# Patient Record
Sex: Female | Born: 1950
Health system: Southern US, Community
[De-identification: ages and names within clinical notes are randomized; demographics above are authoritative.]

## PROBLEM LIST (undated history)

## (undated) DIAGNOSIS — F419 Anxiety disorder, unspecified: Secondary | ICD-10-CM

## (undated) DIAGNOSIS — I10 Essential (primary) hypertension: Secondary | ICD-10-CM

## (undated) DIAGNOSIS — C3490 Malignant neoplasm of unspecified part of unspecified bronchus or lung: Secondary | ICD-10-CM

---

## 2017-09-29 ENCOUNTER — Emergency Department: Payer: Medicaid Other

## 2017-09-29 ENCOUNTER — Emergency Department
Admission: EM | Admit: 2017-09-29 | Discharge: 2017-09-29 | Disposition: A | Discharge status: Home / Self Care | Payer: Medicaid Other | Attending: Emergency Medicine | Admitting: Emergency Medicine

## 2017-09-29 ENCOUNTER — Other Ambulatory Visit: Payer: Self-pay

## 2017-09-29 DIAGNOSIS — F419 Anxiety disorder, unspecified: Secondary | ICD-10-CM | POA: Insufficient documentation

## 2017-09-29 DIAGNOSIS — I10 Essential (primary) hypertension: Secondary | ICD-10-CM | POA: Insufficient documentation

## 2017-09-29 DIAGNOSIS — Z79899 Other long term (current) drug therapy: Secondary | ICD-10-CM | POA: Diagnosis not present

## 2017-09-29 HISTORY — DX: Essential (primary) hypertension: I10

## 2017-09-29 LAB — BASIC METABOLIC PANEL
Anion gap: 9 (ref 5–15)
BUN: 10 mg/dL (ref 8–23)
CO2: 26 mmol/L (ref 22–32)
Calcium: 9.9 mg/dL (ref 8.9–10.3)
Chloride: 107 mmol/L (ref 98–111)
Creatinine, Ser: 0.76 mg/dL (ref 0.44–1.00)
GFR calc Af Amer: >60 mL/min (ref >60)
GFR calc non Af Amer: >60 mL/min (ref >60)
Glucose, Bld: 163 mg/dL — ABNORMAL HIGH (ref 70–99)
Potassium: 3.3 mmol/L — ABNORMAL LOW (ref 3.5–5.1)
Sodium: 142 mmol/L (ref 135–145)

## 2017-09-29 LAB — TROPONIN I: Troponin I: <0.03 ng/mL (ref <0.03)

## 2017-09-29 LAB — CBC
HCT: 39.4 % (ref 35.0–47.0)
Hemoglobin: 13.3 g/dL (ref 12.0–16.0)
MCH: 31.3 pg (ref 26.0–34.0)
MCHC: 33.8 g/dL (ref 32.0–36.0)
MCV: 92.6 fL (ref 80.0–100.0)
Platelets: 235 10*3/uL (ref 150–440)
RBC: 4.25 MIL/uL (ref 3.80–5.20)
RDW: 13.8 % (ref 11.5–14.5)
WBC: 9.8 10*3/uL (ref 3.6–11.0)

## 2017-09-29 MED ORDER — LORAZEPAM 2 MG/ML IJ SOLN
0.5000 mg | Freq: Once | INTRAMUSCULAR | Status: AC
  Administered 2017-09-29: 0.5 mg via INTRAVENOUS
  Filled 2017-09-29: qty 1

## 2017-09-29 NOTE — ED Provider Notes (Signed)
[facility] Emergency Department Provider Note   First MD Initiated Contact with Patient 09/29/17 [phone]     (approximate)  I have reviewed the triage vital signs and the nursing notes.   HISTORY  Chief Complaint Anxiety   HPI Sara Schroeder is a 67 y.o. female with history of hypertension and anxiety presents to the emergency department via EMS with insomnia, feeling very anxious.  Patient's son states that she has had difficulty sleeping for weeks for which she was prescribed fluoxetine Xanax.  Patient's son states that she refuses to take any medications including her antihypertensives on arrival patient blood pressure is 192/89.  Patient appearing very anxious.   Past Medical History:  Diagnosis Date  . Hypertension     There are no active problems to display for this patient.   Past surgical history None  Prior to Admission medications   Not on File    Allergies No known drug allergies.  History reviewed. No pertinent family history.  Social History Social History   Tobacco Use  . Smoking status: Never Smoker  . Smokeless tobacco: Never Used  Substance Use Topics  . Alcohol use: Never    Frequency: Never  . Drug use: Never    Review of Systems Constitutional: No fever/chills Eyes: No visual changes. ENT: No sore throat. Cardiovascular: Denies chest pain. Respiratory: Denies shortness of breath. Gastrointestinal: No abdominal pain.  No nausea, no vomiting.  No diarrhea.  No constipation. Genitourinary: Negative for dysuria. Musculoskeletal: Negative for neck pain.  Negative for back pain. Integumentary: Negative for rash. Neurological: Negative for headaches, focal weakness or numbness. Psychiatric:Positive for insomnia, positive for anxiety  ____________________________________________   PHYSICAL EXAM:  VITAL SIGNS: ED Triage Vitals  Enc Vitals Group     BP 09/29/17 0435 (!) 192/89     Pulse Rate 09/29/17 0435 (!) 103   Resp 09/29/17 0435 19     Temp 09/29/17 0443 98.7 F (37.1 C)     Temp Source 09/29/17 0443 Oral     SpO2 09/29/17 0435 100 %     Weight 09/29/17 0439 43.1 kg (95 lb)     Height 09/29/17 0439 1.575 m (5\' 2" )     Head Circumference --      Peak Flow --      Pain Score --      Pain Loc --      Pain Edu? --      Excl. in [place]? --     Constitutional: Alert and oriented.  Very anxious affect  eyes: Conjunctivae are normal. Head: Atraumatic. Mouth/Throat: Mucous membranes are moist.  Oropharynx non-erythematous. Neck: No stridor.  No meningeal signs.   Cardiovascular: Normal rate, regular rhythm. Good peripheral circulation. Grossly normal heart sounds. Respiratory: Normal respiratory effort.  No retractions. Lungs CTAB. Gastrointestinal: Soft and nontender. No distention.  Musculoskeletal: No lower extremity tenderness nor edema. No gross deformities of extremities. Neurologic:  Normal speech and language. No gross focal neurologic deficits are appreciated.  Skin:  Skin is warm, dry and intact. No rash noted. Psychiatric: Very anxious affect ____________________________________________   LABS (all labs ordered are listed, but only abnormal results are displayed)  Labs Reviewed  BASIC METABOLIC PANEL - Abnormal; Notable for the following components:      Result Value   Potassium 3.3 (*)    Glucose, Bld 163 (*)    All other components within normal limits  CBC  TROPONIN I   ____________________________________________  EKG  ED ECG  REPORT I, Eagle N Mata Rowen, the attending physician, personally viewed and interpreted this ECG.   Date: 09/29/2017  EKG Time: 4:43 AM  Rate: 96  Rhythm: Normal sinus rhythm  Axis: Normal  Intervals: Normal  ST&T Change: None    Procedures   ____________________________________________   INITIAL IMPRESSION / ASSESSMENT AND PLAN / ED COURSE  As part of my medical decision making, I reviewed the following data within the electronic  MEDICAL RECORD NUMBER  67 year old female presented with above-stated history and physical exam consistent with anxiety attack.  However consider the possibility of medical etiology and as such laboratory data was obtained which is markedly unremarkable.  Chest x-ray was ordered however patient refused chest x-ray to be performed.  Patient was given 0.5 mg of Ativan IV on arrival with complete resolution of symptoms.  Patient requesting to be discharged at this time with no further evaluation to be performed.  Strongly advised patient and her son that she follow-up with primary care provider for further outpatient management of anxiety ____________________________________________  FINAL CLINICAL IMPRESSION(S) / ED DIAGNOSES  Final diagnoses:  Anxiety     MEDICATIONS GIVEN DURING THIS VISIT:  Medications  LORazepam (ATIVAN) injection 0.5 mg (0.5 mg Intravenous Given 09/29/17 0459)     ED Discharge Orders    None       Note:  This document was prepared using Dragon voice recognition software and may include unintentional dictation errors.    Gregor Hams, MD 09/29/17 2259

## 2017-09-29 NOTE — ED Notes (Signed)
Pt speaks [place] Glad. Pt and son both refuse to use the interpreter and prefer to use just her son to translate.

## 2017-09-29 NOTE — ED Notes (Signed)
Pts bed changed by this tech after attempting to use bedpan. New linen placed along with new blankets. Pt resting comfortably. No other needs voiced at this time.

## 2017-09-29 NOTE — ED Triage Notes (Signed)
Pt arrived from home with complaints of anxiety and decreased sleep for a few weeks now. Pt has prescription for xanax and HTN medications but wont' take them. Pt is alert and oriented x 4. BP- 180/80. Pt is non-english speaking. Family at bedside. Dr. Owens Shark at bedside.

## 2018-03-01 ENCOUNTER — Emergency Department
Admission: EM | Admit: 2018-03-01 | Discharge: 2018-03-01 | Disposition: A | Discharge status: Home / Self Care | Payer: Medicaid Other | Attending: Emergency Medicine | Admitting: Emergency Medicine

## 2018-03-01 ENCOUNTER — Emergency Department: Payer: Medicaid Other

## 2018-03-01 ENCOUNTER — Encounter: Payer: Self-pay | Admitting: Emergency Medicine

## 2018-03-01 ENCOUNTER — Other Ambulatory Visit: Payer: Self-pay

## 2018-03-01 DIAGNOSIS — R918 Other nonspecific abnormal finding of lung field: Secondary | ICD-10-CM | POA: Diagnosis not present

## 2018-03-01 DIAGNOSIS — R002 Palpitations: Secondary | ICD-10-CM | POA: Diagnosis not present

## 2018-03-01 DIAGNOSIS — R0602 Shortness of breath: Secondary | ICD-10-CM | POA: Diagnosis present

## 2018-03-01 DIAGNOSIS — I10 Essential (primary) hypertension: Secondary | ICD-10-CM | POA: Diagnosis not present

## 2018-03-01 DIAGNOSIS — N3000 Acute cystitis without hematuria: Secondary | ICD-10-CM

## 2018-03-01 DIAGNOSIS — R1084 Generalized abdominal pain: Secondary | ICD-10-CM | POA: Diagnosis not present

## 2018-03-01 DIAGNOSIS — F419 Anxiety disorder, unspecified: Secondary | ICD-10-CM | POA: Diagnosis not present

## 2018-03-01 HISTORY — DX: Anxiety disorder, unspecified: F41.9

## 2018-03-01 LAB — URINALYSIS, COMPLETE (UACMP) WITH MICROSCOPIC
Bilirubin Urine: NEGATIVE
Glucose, UA: NEGATIVE mg/dL
Hgb urine dipstick: NEGATIVE
Ketones, ur: NEGATIVE mg/dL
Nitrite: NEGATIVE
Protein, ur: NEGATIVE mg/dL
Specific Gravity, Urine: 1.003 — ABNORMAL LOW (ref 1.005–1.030)
pH: 7 (ref 5.0–8.0)

## 2018-03-01 LAB — CBC WITH DIFFERENTIAL/PLATELET
Abs Immature Granulocytes: 0.03 10*3/uL (ref 0.00–0.07)
Basophils Absolute: 0.1 10*3/uL (ref 0.0–0.1)
Basophils Relative: 1 %
Eosinophils Absolute: 0.2 10*3/uL (ref 0.0–0.5)
Eosinophils Relative: 2 %
HCT: 40.4 % (ref 36.0–46.0)
Hemoglobin: 13.2 g/dL (ref 12.0–15.0)
Immature Granulocytes: 0 %
Lymphocytes Relative: 23 %
Lymphs Abs: 2.4 10*3/uL (ref 0.7–4.0)
MCH: 30.1 pg (ref 26.0–34.0)
MCHC: 32.7 g/dL (ref 30.0–36.0)
MCV: 92.2 fL (ref 80.0–100.0)
Monocytes Absolute: 0.6 10*3/uL (ref 0.1–1.0)
Monocytes Relative: 6 %
Neutro Abs: 7.5 10*3/uL (ref 1.7–7.7)
Neutrophils Relative %: 68 %
Platelets: 258 10*3/uL (ref 150–400)
RBC: 4.38 MIL/uL (ref 3.87–5.11)
RDW: 13.1 % (ref 11.5–15.5)
WBC: 10.8 10*3/uL — ABNORMAL HIGH (ref 4.0–10.5)
nRBC: 0 % (ref 0.0–0.2)

## 2018-03-01 LAB — COMPREHENSIVE METABOLIC PANEL
ALT: 20 U/L (ref 0–44)
AST: 21 U/L (ref 15–41)
Albumin: 4.7 g/dL (ref 3.5–5.0)
Alkaline Phosphatase: 89 U/L (ref 38–126)
Anion gap: 9 (ref 5–15)
BUN: 15 mg/dL (ref 8–23)
CO2: 25 mmol/L (ref 22–32)
Calcium: 9.1 mg/dL (ref 8.9–10.3)
Chloride: 104 mmol/L (ref 98–111)
Creatinine, Ser: 0.67 mg/dL (ref 0.44–1.00)
GFR calc Af Amer: >60 mL/min (ref >60)
GFR calc non Af Amer: >60 mL/min (ref >60)
Glucose, Bld: 133 mg/dL — ABNORMAL HIGH (ref 70–99)
Potassium: 3.5 mmol/L (ref 3.5–5.1)
Sodium: 138 mmol/L (ref 135–145)
Total Bilirubin: 0.9 mg/dL (ref 0.3–1.2)
Total Protein: 8.2 g/dL — ABNORMAL HIGH (ref 6.5–8.1)

## 2018-03-01 LAB — TSH: TSH: 1.935 u[IU]/mL (ref 0.350–4.500)

## 2018-03-01 LAB — BRAIN NATRIURETIC PEPTIDE: B Natriuretic Peptide: 16 pg/mL (ref 0.0–100.0)

## 2018-03-01 LAB — LIPASE, BLOOD: Lipase: 27 U/L (ref 11–51)

## 2018-03-01 LAB — TROPONIN I: Troponin I: <0.03 ng/mL (ref <0.03)

## 2018-03-01 IMAGING — CT CT ABD-PELV W/ CM
3 of 12 series · 13 of 46 positions shown, 18 images · IV contrast (APPLIED)
Comparison: None.

CLINICAL DATA: Shortness of breath, heart palpitations. Lower
abdominal pain, loose stools.

EXAM:
CT ANGIOGRAPHY CHEST
CT ABDOMEN AND PELVIS WITH CONTRAST
TECHNIQUE: Multidetector CT imaging of the chest was performed using the
standard protocol during bolus administration of intravenous
contrast. Multiplanar CT image reconstructions and MIPs were
obtained to evaluate the vascular anatomy. Multidetector CT imaging
of the abdomen and pelvis was performed using the standard protocol
during bolus administration of intravenous contrast.
CONTRAST:  60mL OMNIPAQUE IOHEXOL 350 MG/ML SOLN

[Series 5: thins · axial · 0.59mm/px · z∈[-180,+49]mm · 8 of 269 slices shown]
[im 20/269  soft-tissue]
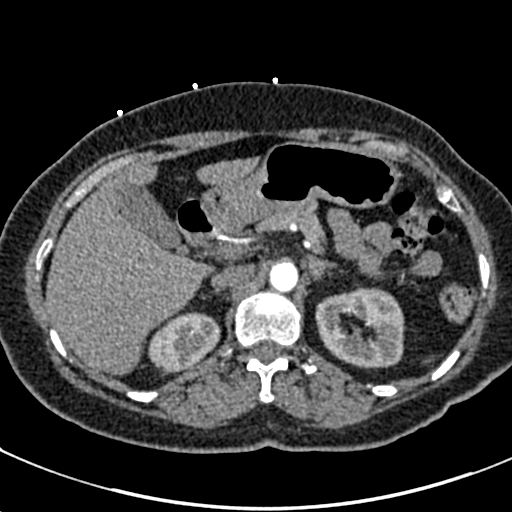
[im 58/269  soft-tissue]
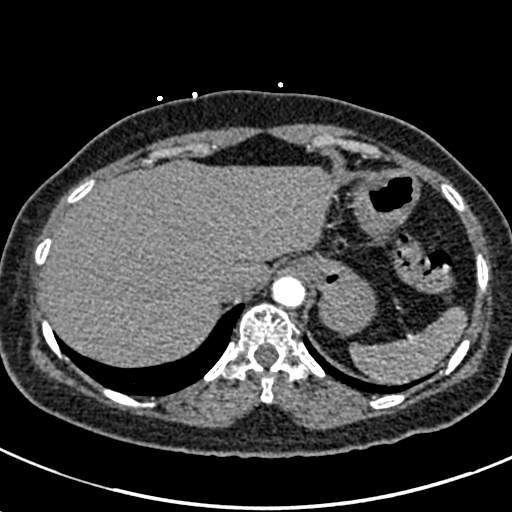
[im 96/269  soft-tissue]
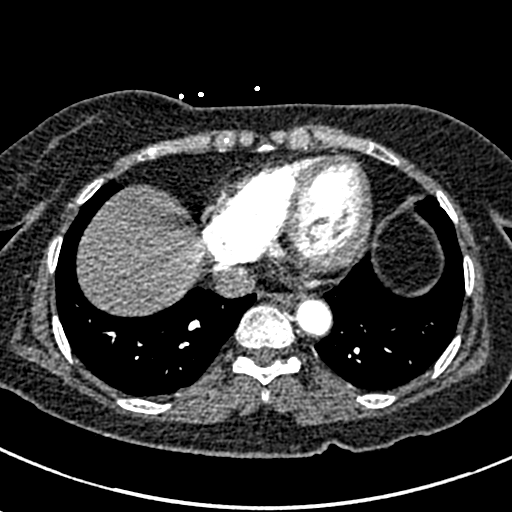
[im 115/269  soft-tissue]
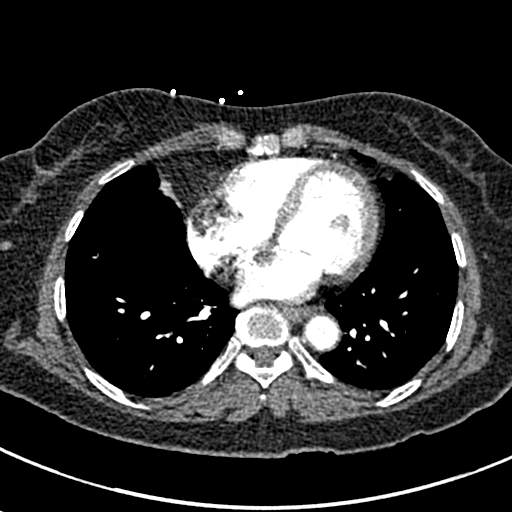
[im 154/269  soft-tissue]
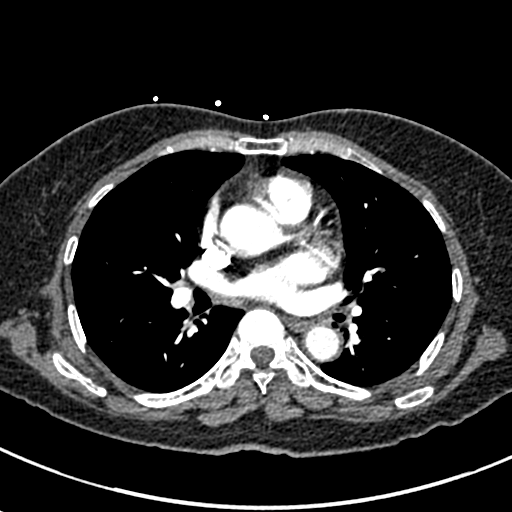
[im 173/269  soft-tissue]
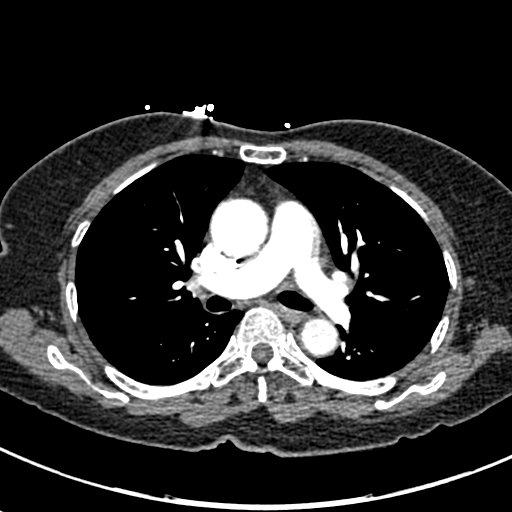
[im 211/269  soft-tissue]
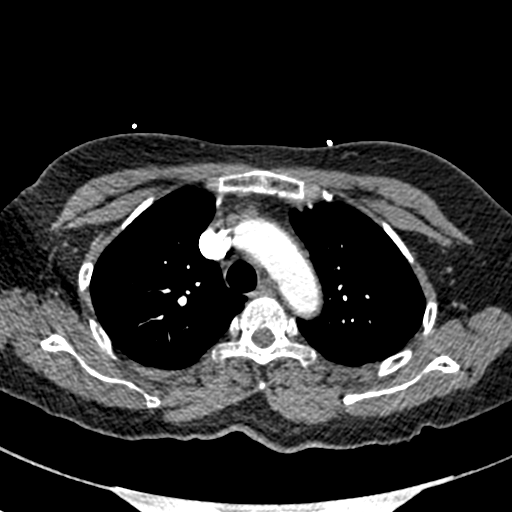
[im 249/269  soft-tissue]
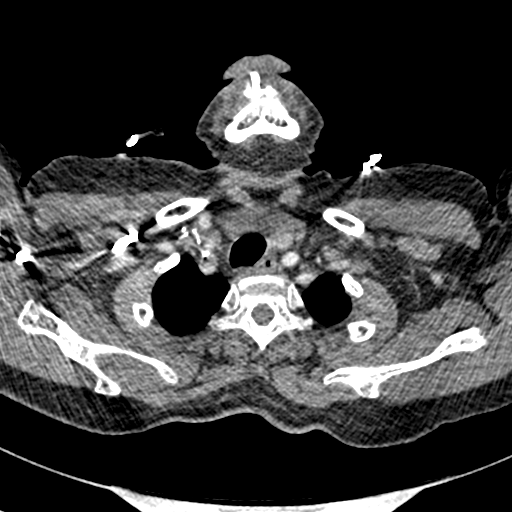

[Series 7: coronal mpr · coronal · 0.55mm/px · 2 of 99 slices shown, 3 images]
[im 33/99  soft-tissue]
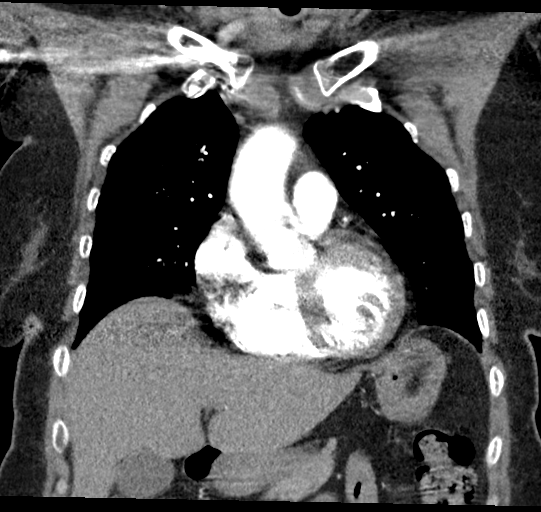
[im 33/99  bone]
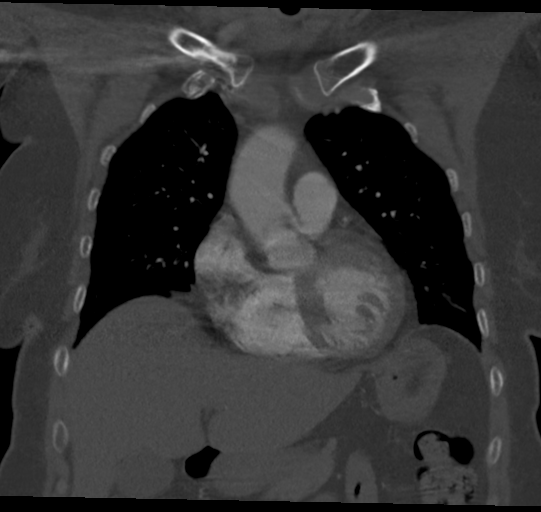
[im 66/99  soft-tissue]
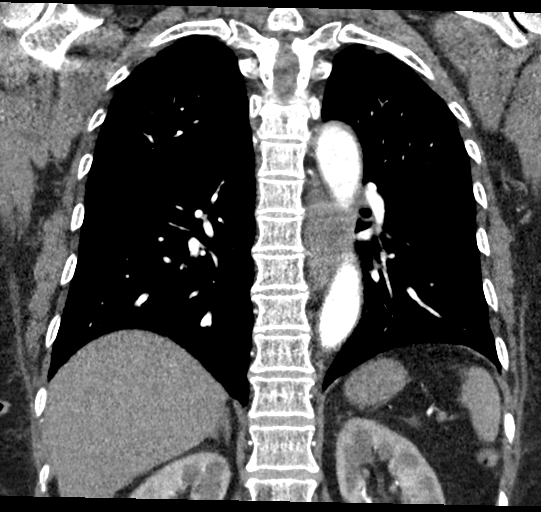

[Series 11: axial st · axial · 0.69mm/px · z∈[-406,-206]mm · 3 of 82 slices shown, 7 images]
[im 21/82  soft-tissue]
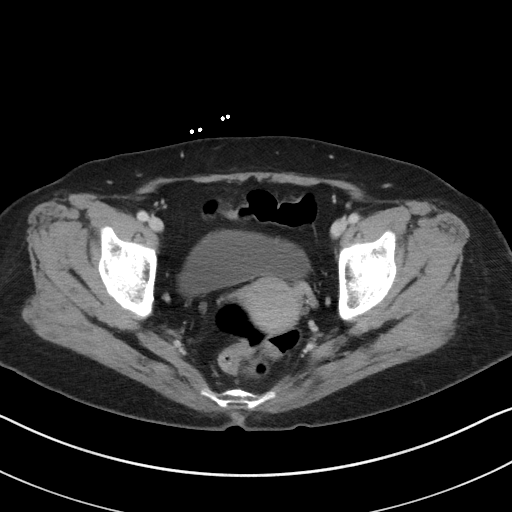
[im 21/82  lung]
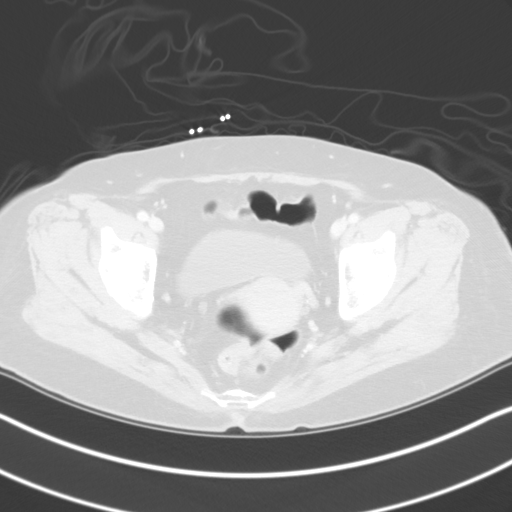
[im 21/82  bone]
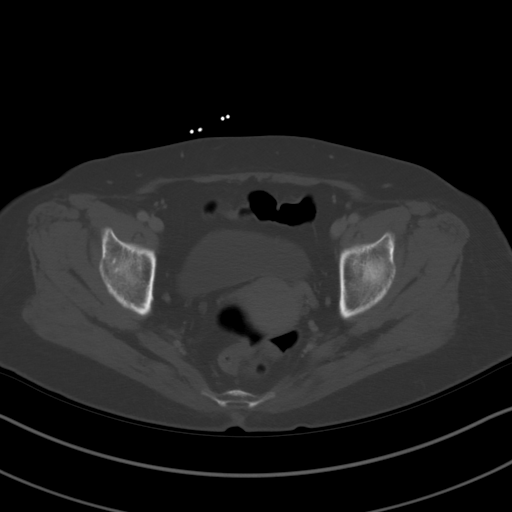
[im 41/82  soft-tissue]
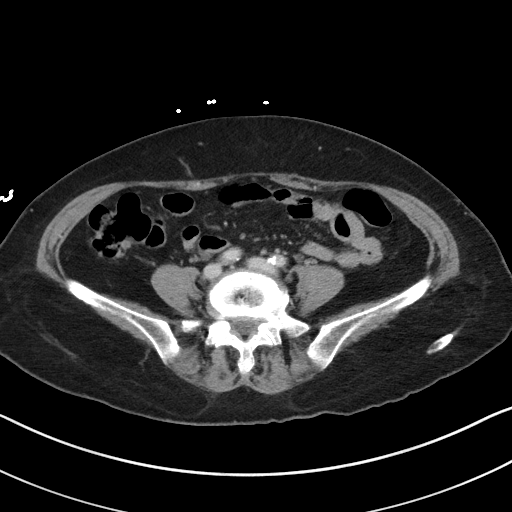
[im 41/82  lung]
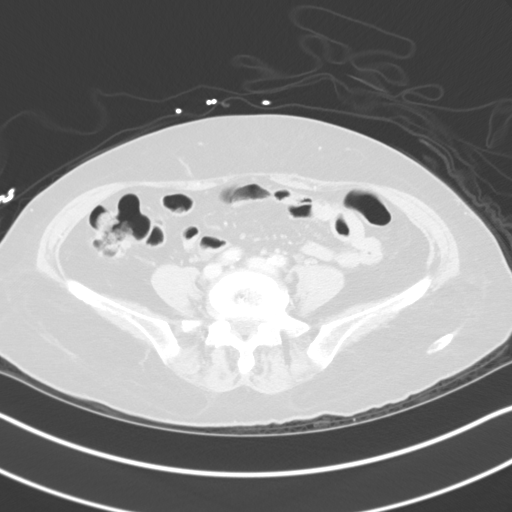
[im 61/82  soft-tissue]
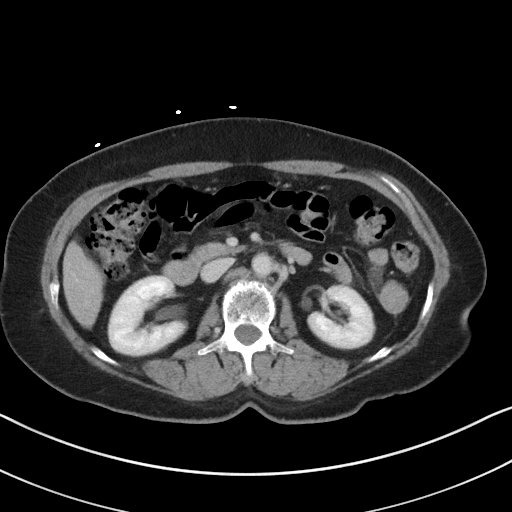
[im 61/82  lung]
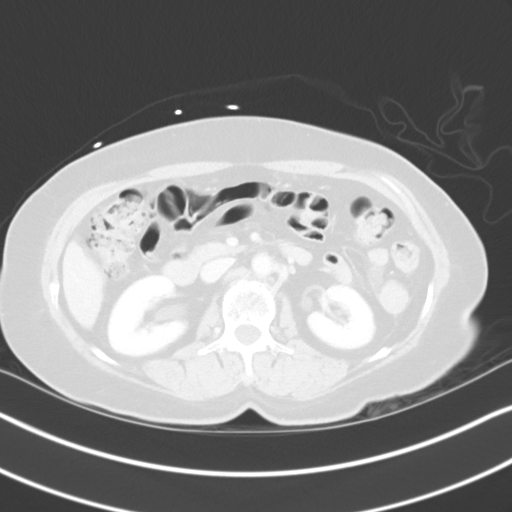

[13 of 46 positions shown; findings below may reference images not displayed]

FINDINGS: CTA CHEST FINDINGS

Cardiovascular: Heart is normal size. Aorta is normal caliber. No
filling defects in the pulmonary arteries to suggest pulmonary
emboli.

Mediastinum/Nodes: No mediastinal, hilar, or axillary adenopathy.

Lungs/Pleura: 10 mm nodule in the right middle lobe has a slightly
spiculated appearance, best seen on sagittal image 39. This is
noncalcified. No additional pulmonary nodules or confluent airspace
opacities. No effusions.

Musculoskeletal: Chest wall soft tissues are unremarkable. No acute
bony abnormality.

Review of the MIP images confirms the above findings.

CT ABDOMEN and PELVIS FINDINGS

Hepatobiliary: No focal hepatic abnormality. Gallbladder
unremarkable.

Pancreas: No focal abnormality or ductal dilatation.

Spleen: No focal abnormality.  Normal size.

Adrenals/Urinary Tract: No adrenal abnormality. No focal renal
abnormality. No stones or hydronephrosis. Urinary bladder is
unremarkable.

Stomach/Bowel: Stomach, large and small bowel grossly unremarkable.
Appendix is normal.

Vascular/Lymphatic: Aortic atherosclerosis. No enlarged abdominal or
pelvic lymph nodes.

Reproductive: Uterus and adnexa unremarkable.  No mass.

Other: No free fluid or free air.

Musculoskeletal: Degenerative changes in the lumbar spine. No acute
bony abnormality.

Review of the MIP images confirms the above findings.
IMPRESSION: 10 mm slightly spiculated nodule in the right middle lobe concerning
for primary lung cancer. Consider one of the following in 3 months
for both low-risk and high-risk individuals: (a) repeat chest CT,
(b) follow-up PET-CT, or (c) tissue sampling. This recommendation
follows the consensus statement: Guidelines for Management of
Incidental Pulmonary Nodules Detected on CT Images: From the

No evidence of pulmonary embolus.

No acute findings in the abdomen or pelvis.

## 2018-03-01 IMAGING — CR DG CHEST 2V
2 series · 2 of 2 positions shown · non-contrast
Comparison: None.

CLINICAL DATA: Shortness of breath.

EXAM:
CHEST - 2 VIEW

[chest pa]
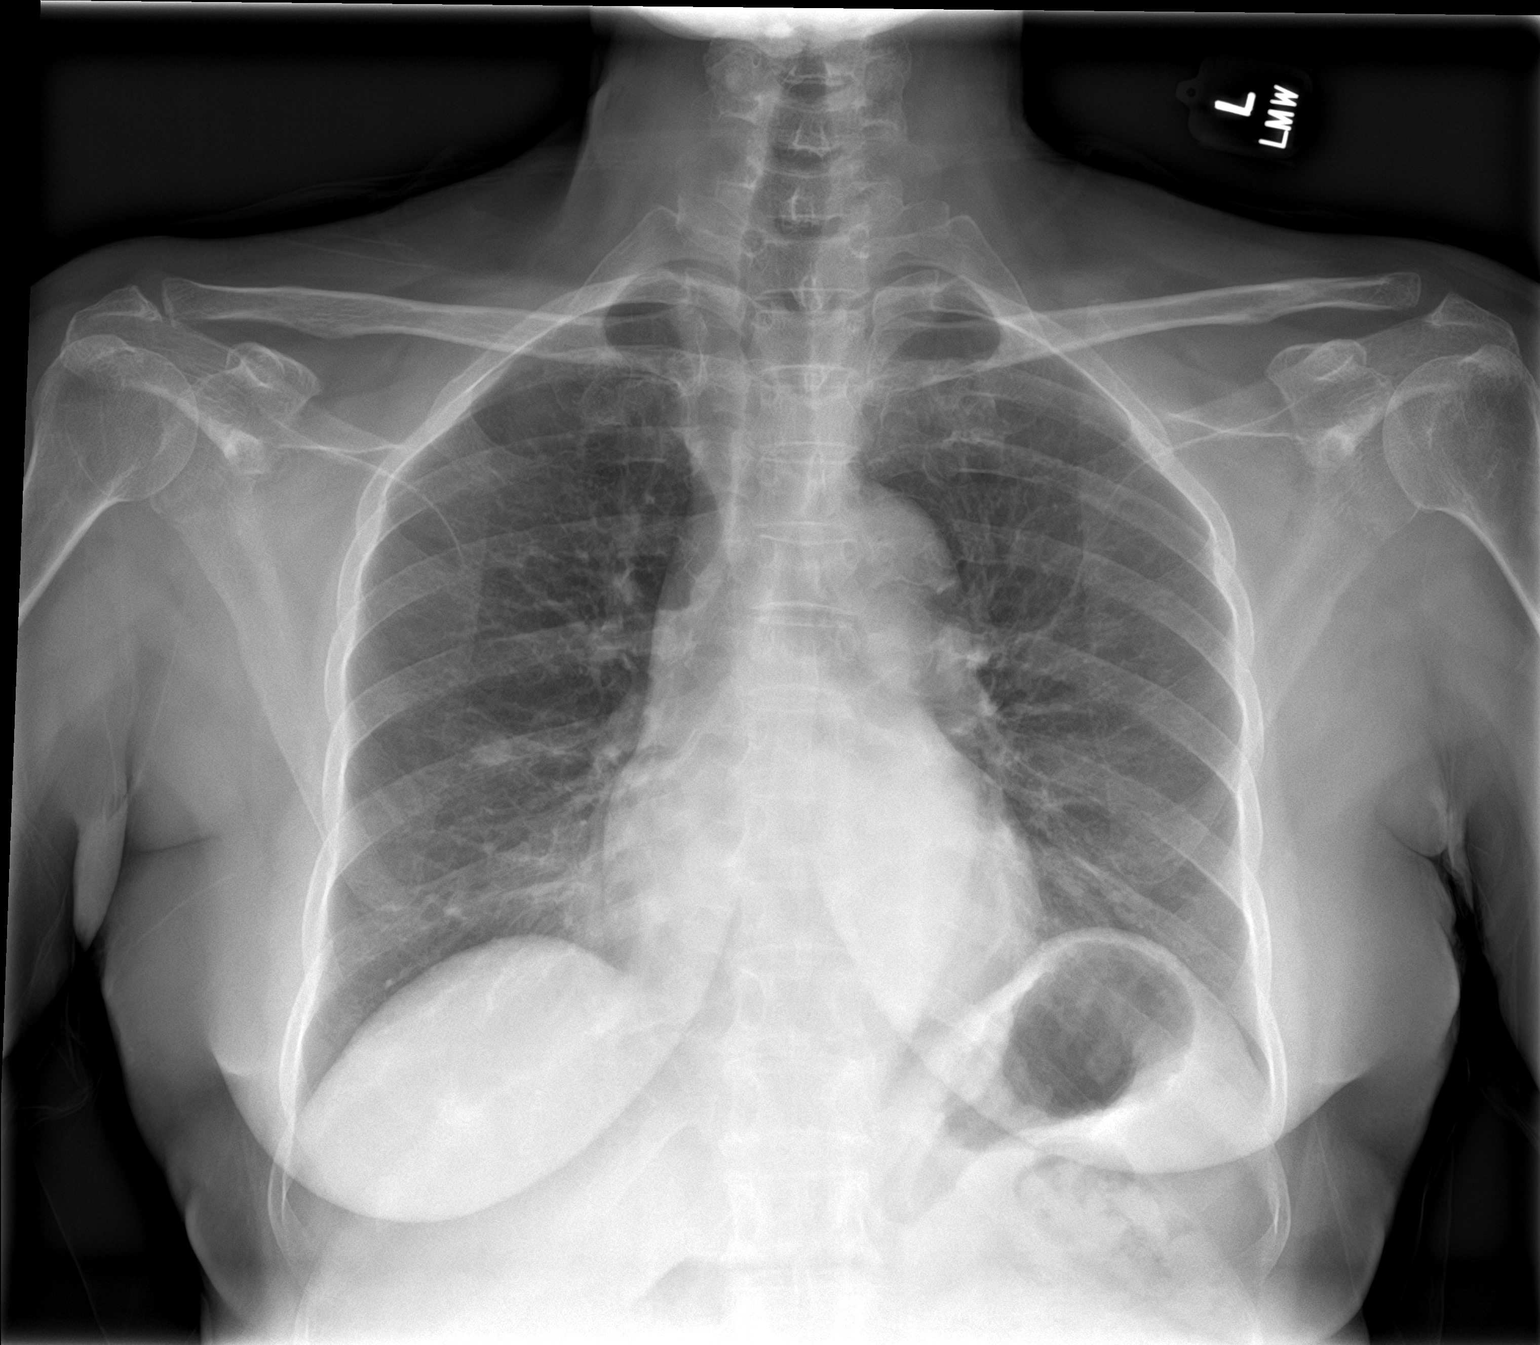

[chest lat]
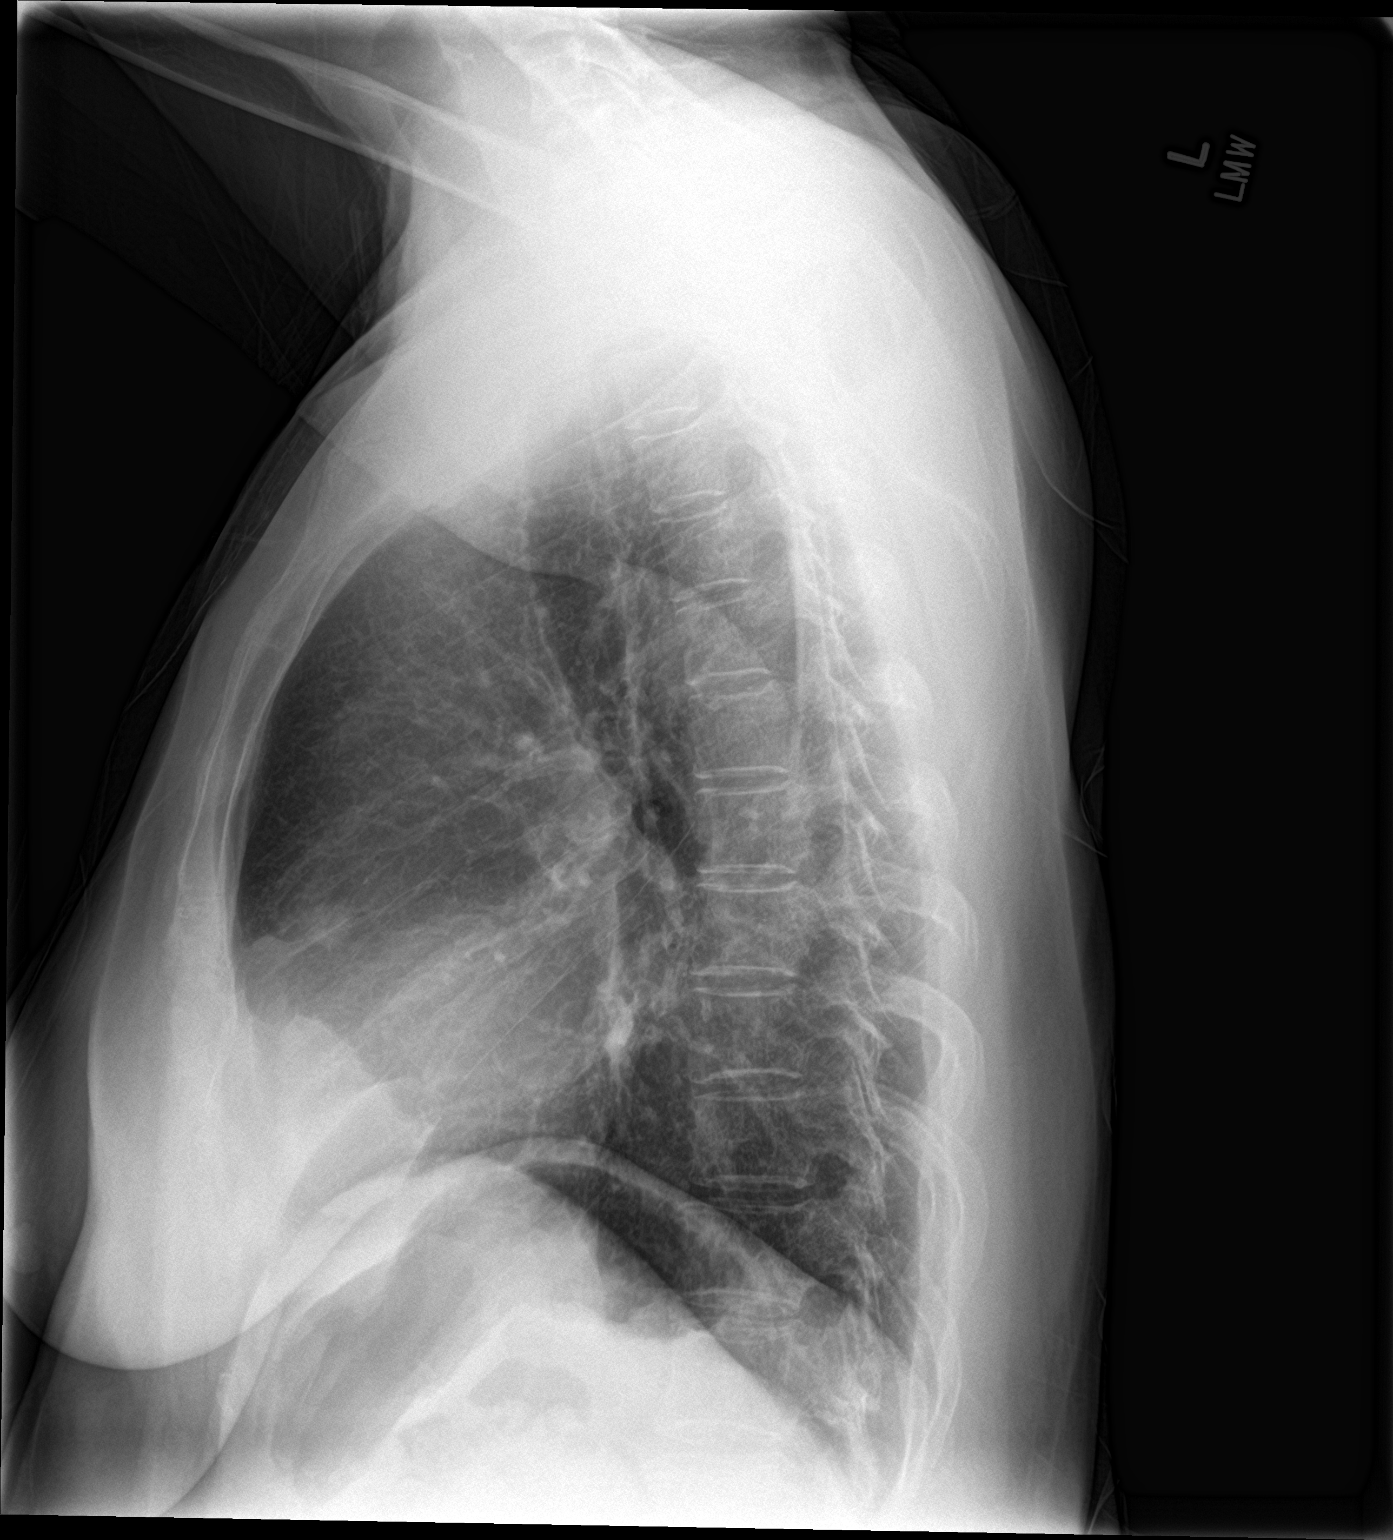

[2 of 2 positions shown; findings below may reference images not displayed]

FINDINGS: The cardiomediastinal contours are normal. Well-defined 10 mm nodule
in the right mid lung may be a calcified granuloma but is
indeterminate. Pulmonary vasculature is normal. No consolidation,
pleural effusion, or pneumothorax. No acute osseous abnormalities
are seen.
IMPRESSION: 1. No acute findings.
2. A 10 mm nodule in the right mid lung may be a calcified granuloma
but is nonspecific. Recommend nonemergent chest CT for
characterization.

## 2018-03-01 MED ORDER — SODIUM CHLORIDE 0.9 % IV SOLN
1.0000 g | Freq: Once | INTRAVENOUS | Status: AC
  Administered 2018-03-01: 1 g via INTRAVENOUS
  Filled 2018-03-01: qty 10

## 2018-03-01 MED ORDER — ALUM & MAG HYDROXIDE-SIMETH 200-200-20 MG/5ML PO SUSP
30.0000 mL | Freq: Once | ORAL | Status: AC
Start: 2018-03-01 — End: 2018-03-01
  Administered 2018-03-01: 30 mL via ORAL
  Filled 2018-03-01: qty 30

## 2018-03-01 MED ORDER — LORAZEPAM 1 MG PO TABS: 1.0000 mg | ORAL_TABLET | Freq: Three times a day (TID) | ORAL | 0 refills | Status: AC | PRN

## 2018-03-01 MED ORDER — CEPHALEXIN 500 MG PO CAPS: 500.0000 mg | ORAL_CAPSULE | Freq: Four times a day (QID) | ORAL | 0 refills | Status: AC

## 2018-03-01 MED ORDER — LORAZEPAM 2 MG/ML IJ SOLN
1.0000 mg | Freq: Once | INTRAMUSCULAR | Status: AC
  Administered 2018-03-01: 1 mg via INTRAVENOUS
  Filled 2018-03-01: qty 1

## 2018-03-01 MED ORDER — IOHEXOL 350 MG/ML SOLN
60.0000 mL | Freq: Once | INTRAVENOUS | Status: AC | PRN
  Administered 2018-03-01: 60 mL via INTRAVENOUS

## 2018-03-01 NOTE — ED Triage Notes (Addendum)
Patient ambulatory to triage with steady gait, without difficulty or distress noted; son st pt reports SHOB, diff sleeping, lower abd pain; denies any recent illness

## 2018-03-01 NOTE — ED Notes (Signed)
Pt ambulated with steady gait noted, 100% on RA. MD aware

## 2018-03-01 NOTE — ED Provider Notes (Signed)
[facility] Emergency Department Provider Note  ____________________________________________  Time seen: Approximately 8:50 AM  I have reviewed the triage vital signs and the nursing notes.   HISTORY  Chief Complaint Shortness of Breath    HPI Sara Schroeder is a 67 y.o. female history of anxiety, HTN, presenting for shortness of breath, abdominal pain, urinary symptoms and diarrhea.  The patient speaks Sara Schroeder and her son is interpreting for [place].  She reports that for the past 2 months, every time she urinates, she has a burning sensation.  She also has multiple loose stools every night.  After these episodes, she becomes anxious, and feels short of breath.  Over a period of 30 to 60 minutes, her symptoms resolve. She also has a central diffuse abdominal "burning".  Occasionally she has palpitations. She has seen her PMD, who treated her for UTI 1 week ago, with repeat UA showing resolution of symptoms.  She has also been placed on omeprazole for her central abdominal "burning," but her symptoms persist.  She is not having any vomiting, fevers or chills.  She has had similar symptoms in the past.  She is being treated with fluoxetine, which she has been taking for several months, for anxiety, as well as once weekly art therapy which comes to her home.  She is not seeing a psychiatrist and has not had any psychiatric evaluation.  She denies any SI, HI or hallucinations.  Past Medical History:  Diagnosis Date  . Anxiety   . Hypertension     There are no active problems to display for this patient.   History reviewed. No pertinent surgical history.    Allergies Patient has no known allergies.  No family history on file.  Social History Social History   Tobacco Use  . Smoking status: Never Smoker  . Smokeless tobacco: Never Used  Substance Use Topics  . Alcohol use: Never    Frequency: Never  . Drug use: Never    Review of Systems Constitutional: No  fever/chills.  Lightheadedness or syncope.  No myalgias. Eyes: No visual changes.  No eye discharge. ENT: No sore throat. No congestion or rhinorrhea. Cardiovascular: Denies chest pain.  Positive palpitations. Respiratory: Positive shortness of breath.  No cough. Gastrointestinal: As of diffuse burning abdominal pain.  No nausea, no vomiting.  Positive diarrhea.  No constipation. Genitourinary: Positive for dysuria.  As of urinary frequency.  No hematuria. Musculoskeletal: Negative for back pain. Skin: Negative for rash. Neurological: Negative for headaches. No focal numbness, tingling or weakness.  Pscyh: + anxiety without SI, HI or hallucinations.    ____________________________________________   PHYSICAL EXAM:  VITAL SIGNS: ED Triage Vitals  Enc Vitals Group     BP 03/01/18 0257 (!) 178/82     Pulse Rate 03/01/18 0257 93     Resp 03/01/18 0257 (!) 24     Temp 03/01/18 0257 98.7 F (37.1 C)     Temp Source 03/01/18 0257 Oral     SpO2 03/01/18 0257 96 %     Weight --      Height 03/01/18 0300 5\' 1"  (1.549 m)     Head Circumference --      Peak Flow --      Pain Score 03/01/18 0258 0     Pain Loc --      Pain Edu? --      Excl. in [place]? --     Constitutional: Patient is alert, tachypneic, and very anxious in appearance.  She  is nontoxic. Eyes: Conjunctivae are normal.  EOMI. No scleral icterus.  No eye discharge. Head: Atraumatic. Nose: No congestion/rhinnorhea. Mouth/Throat: Mucous membranes are widely dry..  Neck: No stridor.  Supple.  No JVD.  No meningismus. Cardiovascular: Normal rate, regular rhythm. No murmurs, rubs or gallops.  Respiratory: The patient is tachypneic without any other abnormalities on her pulmonary examination.  She has no wheezes rales or rhonchi; her O2 sats are 99-100% at rest and remain 100% with ambulation.  She is able to speak in full sentences without any change in her pulmonary status. Gastrointestinal: Soft, and nondistended.  Diffusely  tender without focality.  Negative Murphy sign.  No guarding or rebound.  No peritoneal signs. Musculoskeletal: No LE edema. No ttp in the calves or palpable cords.  Negative Homan's sign. Neurologic:  A&Ox3.  Speech is clear.  Face and smile are symmetric.  EOMI.  Moves all extremities well. Skin:  Skin is warm, dry and intact. No rash noted. Psychiatric: Significant anxious affect.  ____________________________________________   LABS (all labs ordered are listed, but only abnormal results are displayed)  Labs Reviewed  CBC WITH DIFFERENTIAL/PLATELET - Abnormal; Notable for the following components:      Result Value   WBC 10.8 (*)    All other components within normal limits  COMPREHENSIVE METABOLIC PANEL - Abnormal; Notable for the following components:   Glucose, Bld 133 (*)    Total Protein 8.2 (*)    All other components within normal limits  URINALYSIS, COMPLETE (UACMP) WITH MICROSCOPIC - Abnormal; Notable for the following components:   Color, Urine COLORLESS (*)    APPearance CLEAR (*)    Specific Gravity, Urine 1.003 (*)    Leukocytes, UA TRACE (*)    Bacteria, UA RARE (*)    All other components within normal limits  URINE CULTURE  C DIFFICILE QUICK SCREEN W PCR REFLEX  GASTROINTESTINAL PANEL BY PCR, STOOL (REPLACES STOOL CULTURE)  LIPASE, BLOOD  TROPONIN I  BRAIN NATRIURETIC PEPTIDE  TSH   ____________________________________________  EKG  ED ECG REPORT I, Anne-Caroline Mariea Clonts, the attending physician, personally viewed and interpreted this ECG.   Date: 03/01/2018  EKG Time: 301  Rate: 97  Rhythm: normal sinus rhythm  Axis: normal  Intervals:none  ST&T Change: No STEMI  ____________________________________________  RADIOLOGY  Dg Chest 2 View  Result Date: 03/01/2018 CLINICAL DATA:  Shortness of breath. EXAM: CHEST - 2 VIEW COMPARISON:  None. FINDINGS: The cardiomediastinal contours are normal. Well-defined 10 mm nodule in the right mid lung may be  a calcified granuloma but is indeterminate. Pulmonary vasculature is normal. No consolidation, pleural effusion, or pneumothorax. No acute osseous abnormalities are seen. IMPRESSION: 1. No acute findings. 2. A 10 mm nodule in the right mid lung may be a calcified granuloma but is nonspecific. Recommend nonemergent chest CT for characterization. Electronically Signed   By: Keith Rake M.D.   On: 03/01/2018 03:34    ____________________________________________   PROCEDURES  Procedure(s) performed: None  Procedures  Critical Care performed: No ____________________________________________   INITIAL IMPRESSION / ASSESSMENT AND PLAN / ED COURSE  Pertinent labs & imaging results that were available during my care of the patient were reviewed by me and considered in my medical decision making (see chart for details).  68 y.o. female with a history of hypertension and anxiety presenting with 2 months of daily tachypnea, palpitations, that occur after dysuria or daily loose stools.  Overall, the patient is hemodynamically stable and has reassuring objective findings  on her exam and laboratory studies.  Her white blood cell count is 10.8 and her hemoglobin and hematocrit are normal.  Her electrolytes are also reassuring.  Her lipase is normal.  Her liver function tests are normal.  She does have some bacteria in her urine as well as leukocyte esterase, and a gram of Rocephin has been ordered.  I sent her urine for culture.  There are multiple possible medical and psychiatric etiologies for the patient's symptoms.  Clinically, I am most concerned about significant, uncontrolled anxiety.  I have had a long discussion with the patient and her family, and offered ED psychiatric evaluation.  They prefer an outpatient treatment course so I will talk to TTS about giving the patient our outpatient psychiatric resources.  However, I will rule out other possible medical conditions for her symptoms.  Stool  studies including C. difficile have been ordered.  The patient will also have a troponin done, and a BNP; her EKG does not show arrhythmia or ischemic changes.  She will also have a TSH tested.  CT of the chest rule out PE and a CT of the abdomen to rule out an acute intra-abdominal pathology have also been ordered.  Patient has never seen a GI specialist.  A thorough emergent condition work-up will be performed prior to discharge for outpatient psychiatric evaluation.  The patient will begin on Ativan and reevaluated for her symptoms.  ----------------------------------------- 9:49 AM on 03/01/2018 -----------------------------------------  The patient's work-up in the emergency department has been reassuring.  Her CT of the abdomen does not show any acute process.  The CT of her chest shows a mass in the right midlung, which will need further outpatient evaluation and I have explained this to the patient and her family, as well as written thorough notes about this in her discharge paperwork.  She understands that it could potentially be a malignancy or neoplasm.  The patient symptoms significantly improved after oral Ativan.  I will plan to discharge her home with 12 tablets of Ativan and TTS will bring her a list of the local psychiatrist who will be able to see her for outpatient psychiatric evaluation.  I have also recommended that she continue her fluoxetine as prescribed.  At this time, the patient is safe for discharge home as there are no emergent conditions that have been found.  Follow-up instructions as well as return precautions were discussed.  ____________________________________________  FINAL CLINICAL IMPRESSION(S) / ED DIAGNOSES  Final diagnoses:  None         NEW MEDICATIONS STARTED DURING THIS VISIT:  New Prescriptions   No medications on file      Eula Listen, MD 03/01/18 [phone]

## 2018-03-01 NOTE — ED Notes (Signed)
Patient ambulatory to Rm 18, [place] RN aware of room placement.

## 2018-03-01 NOTE — Discharge Instructions (Addendum)
Please take the entire course of antibiotics for your urinary tract infection, even if you are feeling better.  Please have your primary care physician follow-up the results of your urine culture to make sure this antibiotic is the best choice for the type of infection that you have.  Here, you were unable to give a sample of your stool.  Please take the specimen cup with you and if you are able to capture some diarrhea, bring it with you to your primary care physician who can evaluate it for infection.  Please continue to take the omeprazole and make an appointment to see the gastrointestinal doctor for further evaluation of your abdominal burning and diarrhea.  Please follow the instructions for bland diet to decrease your symptoms.  Today, the CT scan of your chest shows an abnormal mass in the right lung.  It is unclear what this mass is.  Your primary care physician will need to follow-up the results, either with additional imaging or referral to have this mass evaluated with biopsy.  It is very important that you follow-up with this result, as it may be the sign of a lung cancer.  For your anxiety, please make an appointment with a psychiatrist.  Continue the fluoxetine, and take Ativan as needed if you are feeling anxious.  Return to the emergency department for severe pain, shortness of breath, chest pain, lightheadedness or fainting, new symptoms, vomiting, or any other symptoms concerning to you.

## 2018-03-02 LAB — URINE CULTURE: Culture: NO GROWTH

## 2018-03-04 ENCOUNTER — Telehealth: Payer: Self-pay | Admitting: Gastroenterology

## 2018-03-04 NOTE — Telephone Encounter (Signed)
I have called & l/m for a return call to set up a 1 wk ED f/u per Dr Marius Ditch in my in basket.

## 2018-03-11 NOTE — Telephone Encounter (Signed)
I tried calling today & the phone just rang and the went dead. I will try again later.

## 2018-03-18 ENCOUNTER — Telehealth: Payer: Self-pay | Admitting: Gastroenterology

## 2018-03-18 NOTE — Telephone Encounter (Signed)
I again have called Day Day patient's son  And L/M to schedule an ED f/u appointment with Dr Marius Ditch per her request.

## 2018-03-25 ENCOUNTER — Encounter: Payer: Self-pay | Admitting: Gastroenterology

## 2019-06-26 ENCOUNTER — Emergency Department
Admission: EM | Admit: 2019-06-26 | Discharge: 2019-06-27 | Disposition: A | Discharge status: Home / Self Care | Payer: Medicare Other | Attending: Emergency Medicine | Admitting: Emergency Medicine

## 2019-06-26 ENCOUNTER — Emergency Department: Payer: Medicare Other

## 2019-06-26 DIAGNOSIS — R900 Intracranial space-occupying lesion found on diagnostic imaging of central nervous system: Secondary | ICD-10-CM | POA: Diagnosis not present

## 2019-06-26 DIAGNOSIS — R4182 Altered mental status, unspecified: Secondary | ICD-10-CM | POA: Diagnosis not present

## 2019-06-26 DIAGNOSIS — R0989 Other specified symptoms and signs involving the circulatory and respiratory systems: Secondary | ICD-10-CM | POA: Diagnosis not present

## 2019-06-26 DIAGNOSIS — D699 Hemorrhagic condition, unspecified: Secondary | ICD-10-CM | POA: Diagnosis not present

## 2019-06-26 DIAGNOSIS — R569 Unspecified convulsions: Secondary | ICD-10-CM | POA: Insufficient documentation

## 2019-06-26 DIAGNOSIS — Z20822 Contact with and (suspected) exposure to covid-19: Secondary | ICD-10-CM | POA: Insufficient documentation

## 2019-06-26 DIAGNOSIS — I10 Essential (primary) hypertension: Secondary | ICD-10-CM | POA: Diagnosis not present

## 2019-06-26 LAB — CBC WITH DIFFERENTIAL/PLATELET
Abs Immature Granulocytes: 0.06 10*3/uL (ref 0.00–0.07)
Basophils Absolute: 0.1 10*3/uL (ref 0.0–0.1)
Basophils Relative: 0 %
Eosinophils Absolute: 0.2 10*3/uL (ref 0.0–0.5)
Eosinophils Relative: 1 %
HCT: 38.7 % (ref 36.0–46.0)
Hemoglobin: 13.1 g/dL (ref 12.0–15.0)
Immature Granulocytes: 0 %
Lymphocytes Relative: 10 %
Lymphs Abs: 1.9 10*3/uL (ref 0.7–4.0)
MCH: 31 pg (ref 26.0–34.0)
MCHC: 33.9 g/dL (ref 30.0–36.0)
MCV: 91.7 fL (ref 80.0–100.0)
Monocytes Absolute: 0.6 10*3/uL (ref 0.1–1.0)
Monocytes Relative: 3 %
Neutro Abs: 15.2 10*3/uL — ABNORMAL HIGH (ref 1.7–7.7)
Neutrophils Relative %: 86 %
Platelets: 230 10*3/uL (ref 150–400)
RBC: 4.22 MIL/uL (ref 3.87–5.11)
RDW: 13.2 % (ref 11.5–15.5)
WBC: 17.9 10*3/uL — ABNORMAL HIGH (ref 4.0–10.5)
nRBC: 0 % (ref 0.0–0.2)

## 2019-06-26 LAB — COMPREHENSIVE METABOLIC PANEL
ALT: 17 U/L (ref 0–44)
AST: 25 U/L (ref 15–41)
Albumin: 4.8 g/dL (ref 3.5–5.0)
Alkaline Phosphatase: 82 U/L (ref 38–126)
Anion gap: 9 (ref 5–15)
BUN: 19 mg/dL (ref 8–23)
CO2: 26 mmol/L (ref 22–32)
Calcium: 9.3 mg/dL (ref 8.9–10.3)
Chloride: 103 mmol/L (ref 98–111)
Creatinine, Ser: 0.67 mg/dL (ref 0.44–1.00)
GFR calc Af Amer: >60 mL/min (ref >60)
GFR calc non Af Amer: >60 mL/min (ref >60)
Glucose, Bld: 114 mg/dL — ABNORMAL HIGH (ref 70–99)
Potassium: 3.5 mmol/L (ref 3.5–5.1)
Sodium: 138 mmol/L (ref 135–145)
Total Bilirubin: 1.2 mg/dL (ref 0.3–1.2)
Total Protein: 7.9 g/dL (ref 6.5–8.1)

## 2019-06-26 LAB — URINALYSIS, COMPLETE (UACMP) WITH MICROSCOPIC
Bacteria, UA: NONE SEEN
Bilirubin Urine: NEGATIVE
Glucose, UA: NEGATIVE mg/dL
Hgb urine dipstick: NEGATIVE
Ketones, ur: NEGATIVE mg/dL
Leukocytes,Ua: NEGATIVE
Nitrite: NEGATIVE
Protein, ur: 100 mg/dL — AB
Specific Gravity, Urine: 1.016 (ref 1.005–1.030)
pH: 8 (ref 5.0–8.0)

## 2019-06-26 LAB — RESPIRATORY PANEL BY RT PCR (FLU A&B, COVID)
Influenza A by PCR: NEGATIVE
Influenza B by PCR: NEGATIVE
SARS Coronavirus 2 by RT PCR: NEGATIVE

## 2019-06-26 LAB — PROTIME-INR
INR: 0.9 (ref 0.8–1.2)
Prothrombin Time: 12 seconds (ref 11.4–15.2)

## 2019-06-26 LAB — TROPONIN I (HIGH SENSITIVITY)
Troponin I (High Sensitivity): 5 ng/L (ref <18)
Troponin I (High Sensitivity): 14 ng/L (ref <18)

## 2019-06-26 IMAGING — DX DG CHEST 1V PORT
1 series · 1 of 1 positions shown · non-contrast
Comparison: Earlier radiograph dated [DATE].

CLINICAL DATA: 68-year-old female status post intubation.

EXAM:
PORTABLE CHEST 1 VIEW

[chest ap]
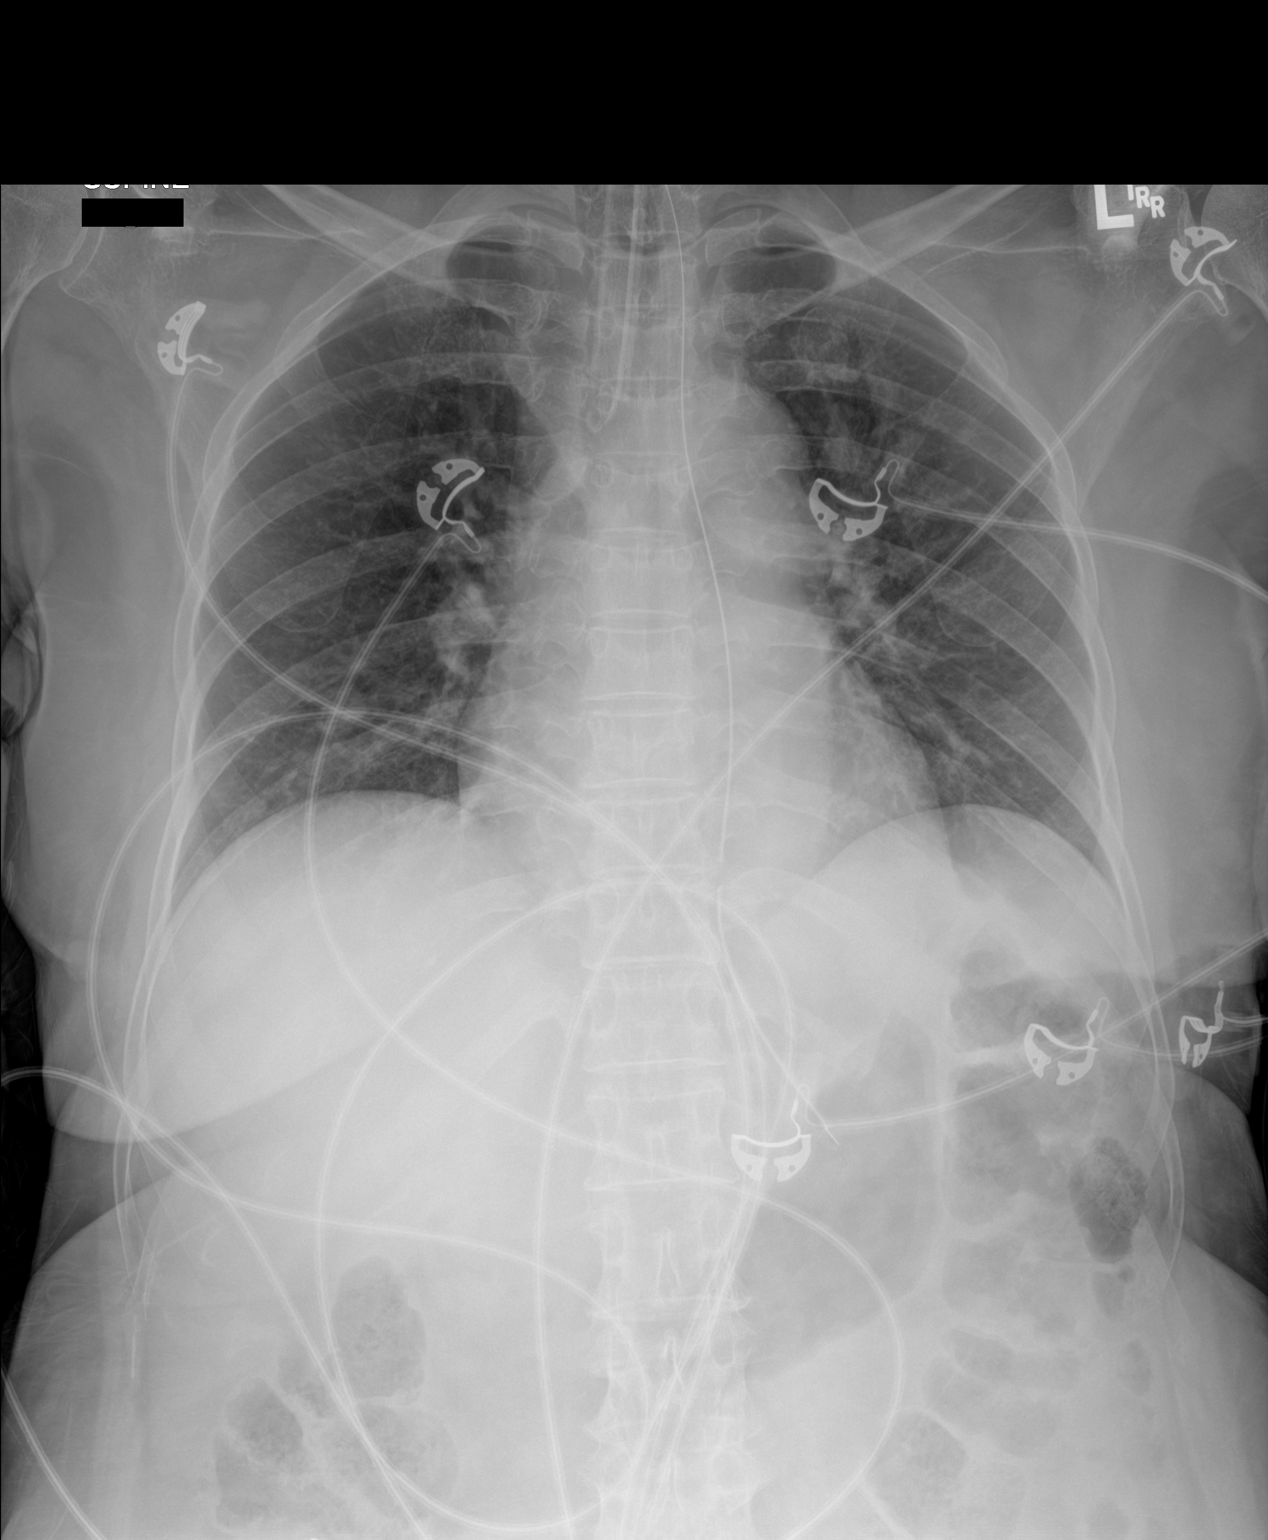

[1 of 1 positions shown; findings below may reference images not displayed]

FINDINGS: Endotracheal tube with tip approximately 18 mm above the carina.
Recommend retraction by 2-3 cm for optimal positioning. Enteric tube
with side port above the GE junction. Recommend further advancing by
additional 8 cm. No other interval change.
IMPRESSION: 1. Endotracheal tube above the carina.
2. Enteric tube with side port above the GE junction. Recommend
further advancing by additional 8 cm.

## 2019-06-26 IMAGING — DX DG CHEST 1V PORT
1 series · 1 of 1 positions shown · non-contrast
Comparison: [DATE]

CLINICAL DATA: Fever

EXAM:
PORTABLE CHEST 1 VIEW

[chest ap]
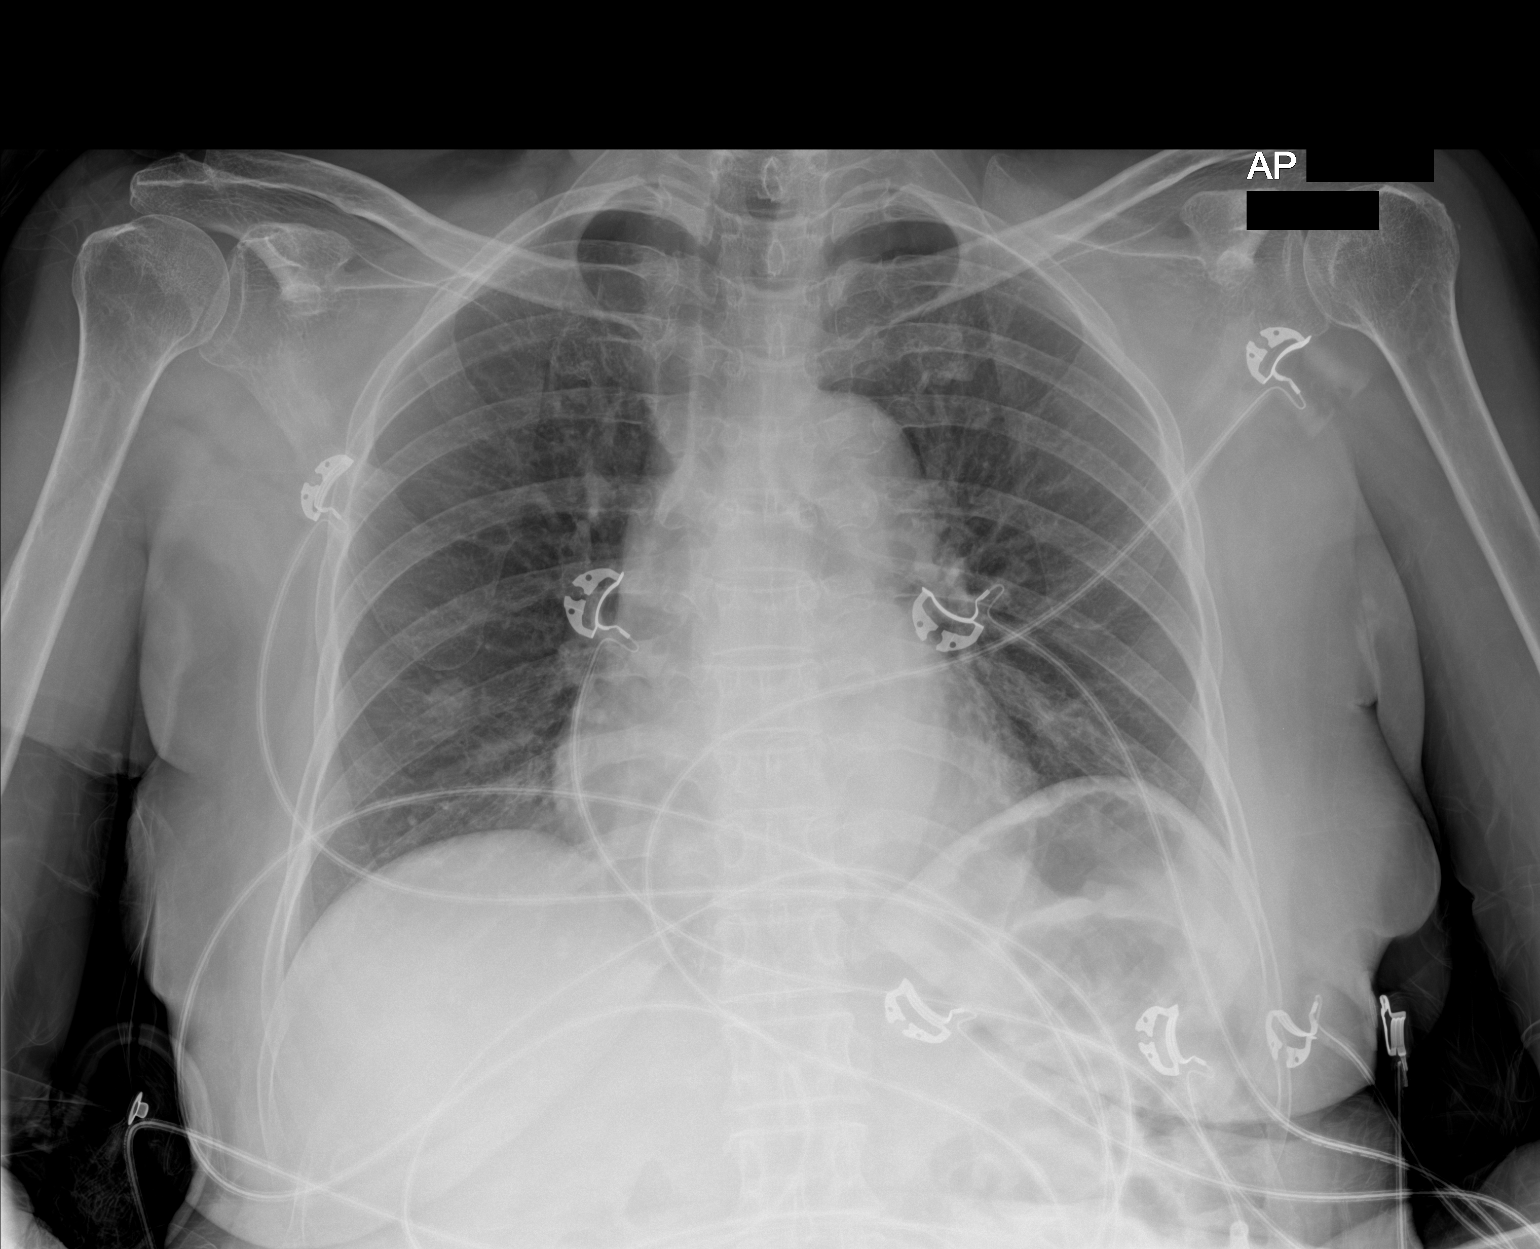

[1 of 1 positions shown; findings below may reference images not displayed]

FINDINGS: The heart size and mediastinal contours are within normal limits.
Right lower lung nodule may be slightly increased, now measuring 14
mm. The visualized skeletal structures are unremarkable.
IMPRESSION: No active disease. Suspected slight increase in size of right lower
lung nodule. Follow-up chest CT is recommended.

## 2019-06-26 IMAGING — CT CT HEAD W/O CM
3 of 4 series · 16 of 47 positions shown, 19 images · non-contrast
Comparison: None.

CLINICAL DATA: Seizure

EXAM:
CT HEAD WITHOUT CONTRAST
TECHNIQUE: Contiguous axial images were obtained from the base of the skull
through the vertex without intravenous contrast.

[Series 3: head wo · axial · 0.38mm/px · z∈[-175,-45]mm · 10 of 32 slices shown, 13 images]
[im 3/32  brain]
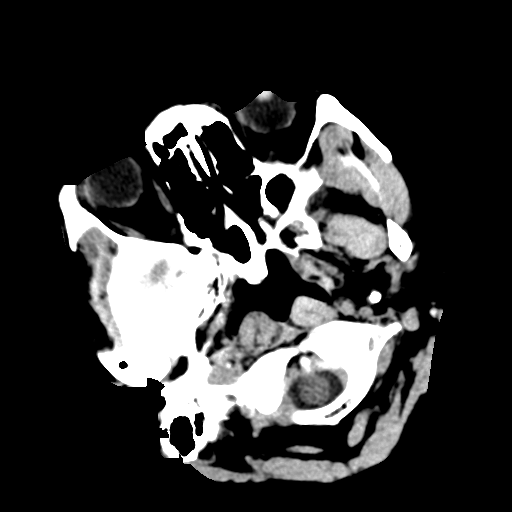
[im 3/32  bone]
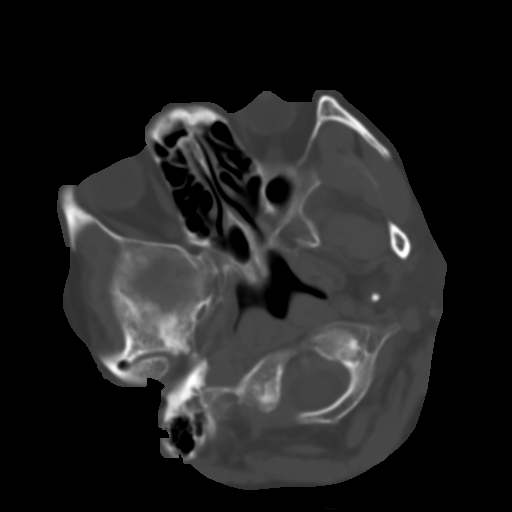
[im 5/32  brain]
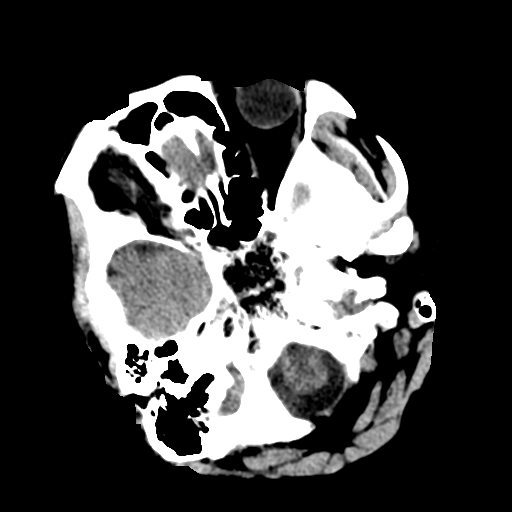
[im 9/32  brain]
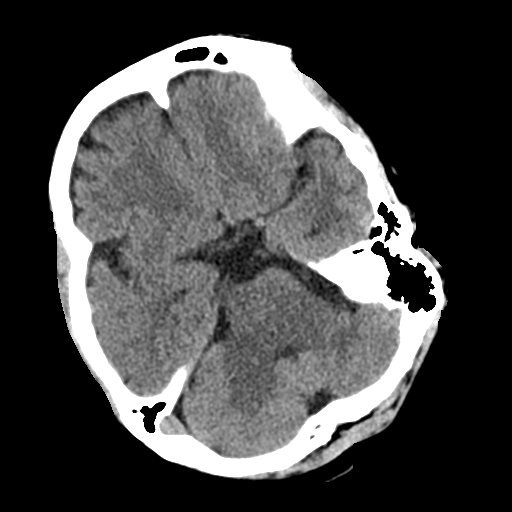
[im 12/32  brain]
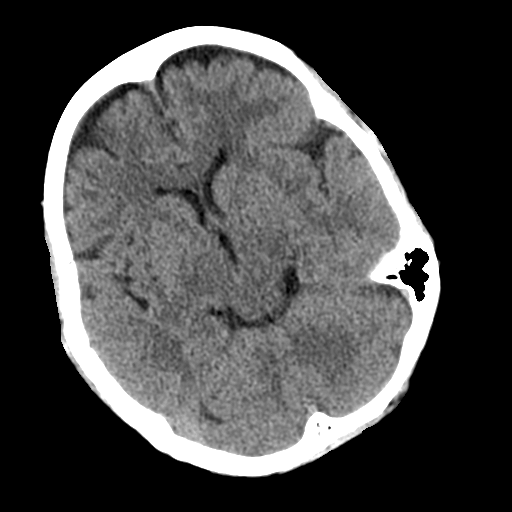
[im 14/32  brain]
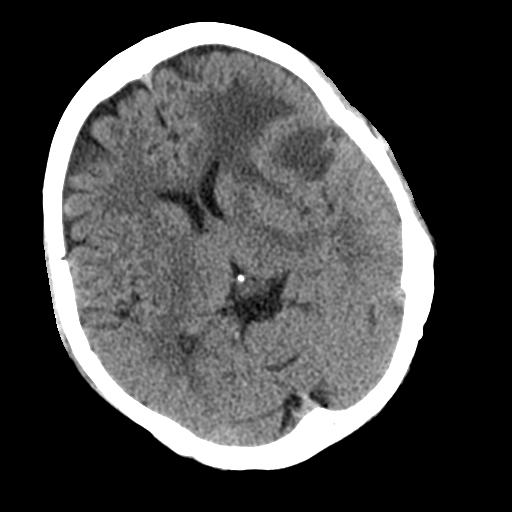
[im 14/32  bone]
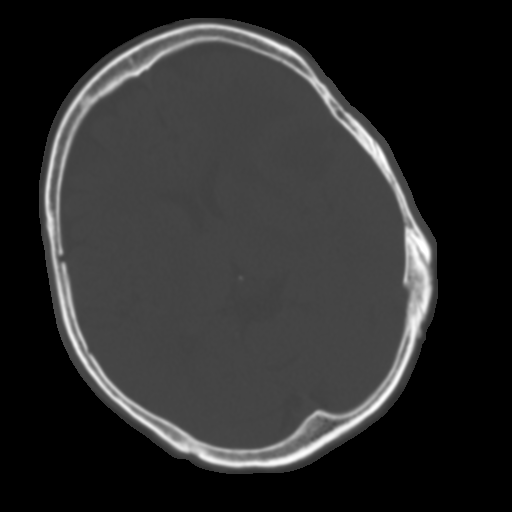
[im 18/32  brain]
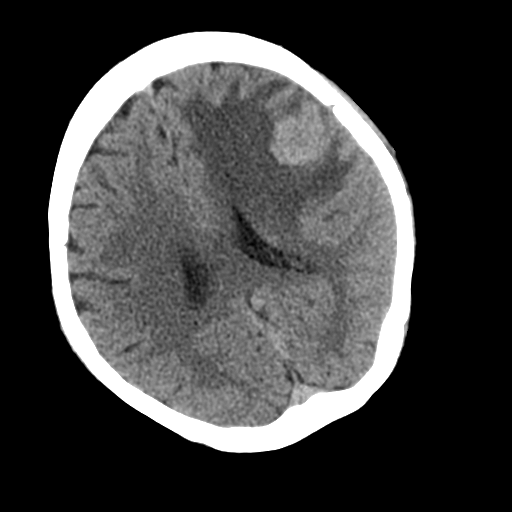
[im 20/32  brain]
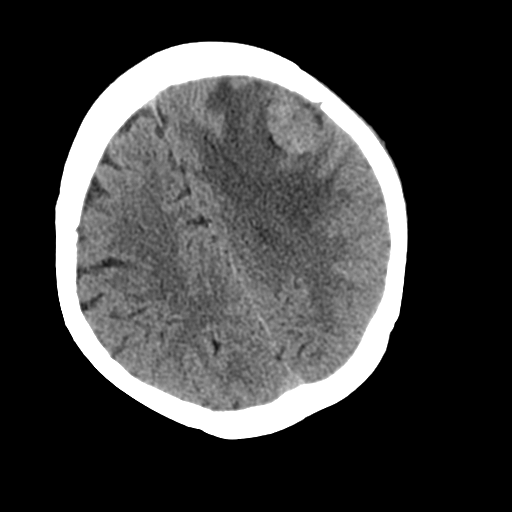
[im 23/32  brain]
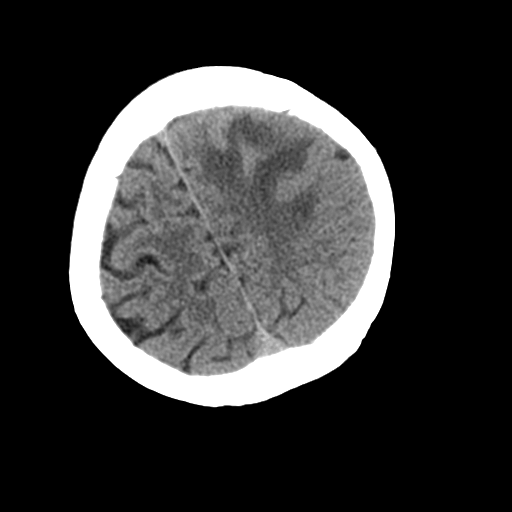
[im 27/32  brain]
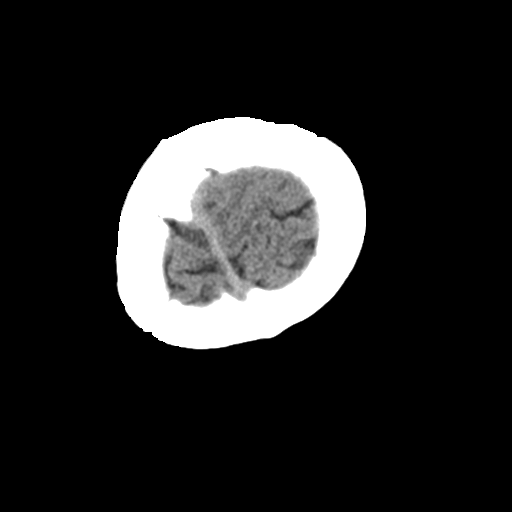
[im 27/32  bone]
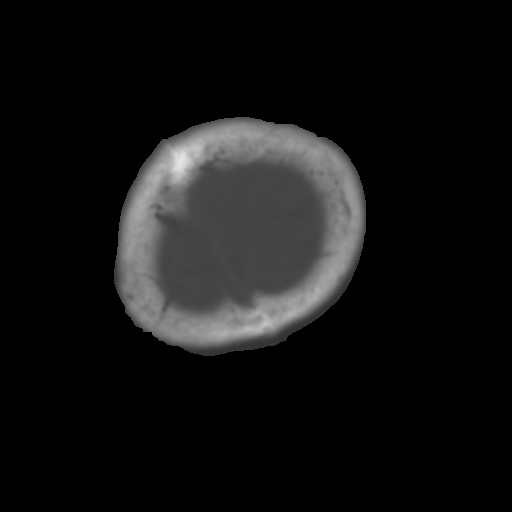
[im 29/32  brain]
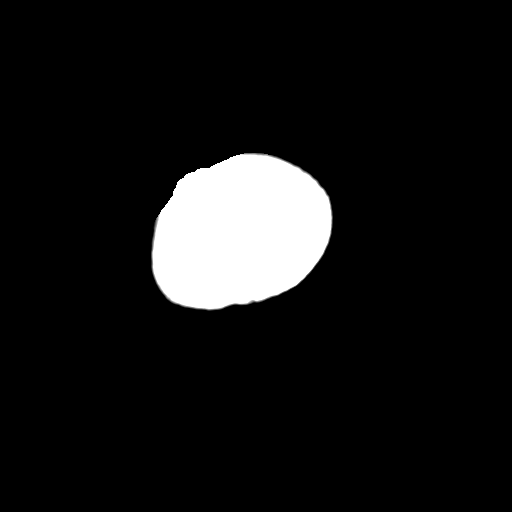

[Series 4: coronal soft tissue · coronal · 0.29mm/px · 3 of 60 slices shown]
[im 20/60  brain]
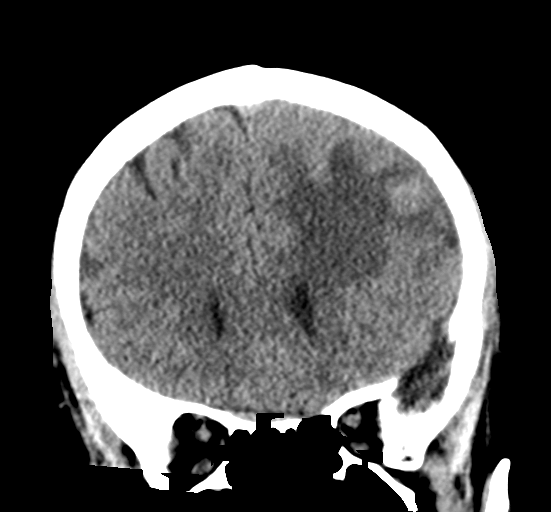
[im 27/60  brain]
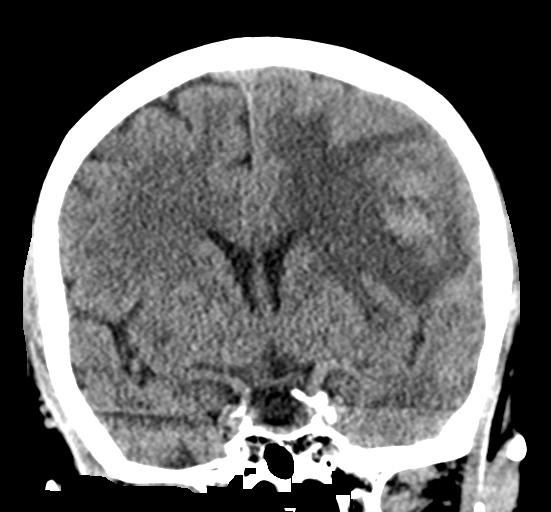
[im 33/60  brain]
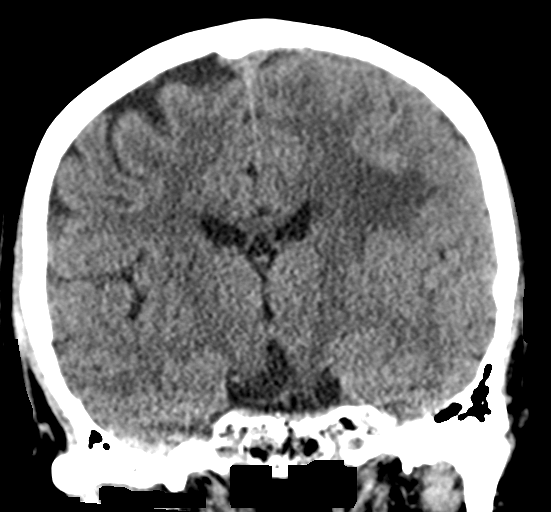

[Series 5: sagittal soft tissue · sagittal · 0.29mm/px · 3 of 53 slices shown]
[im 18/53  brain]
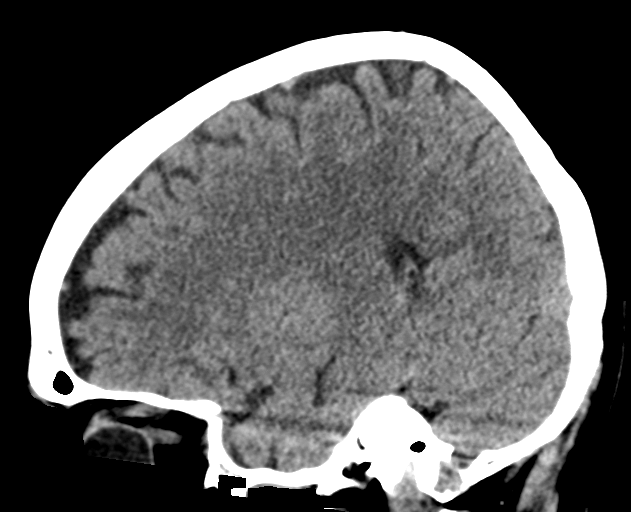
[im 27/53  brain]
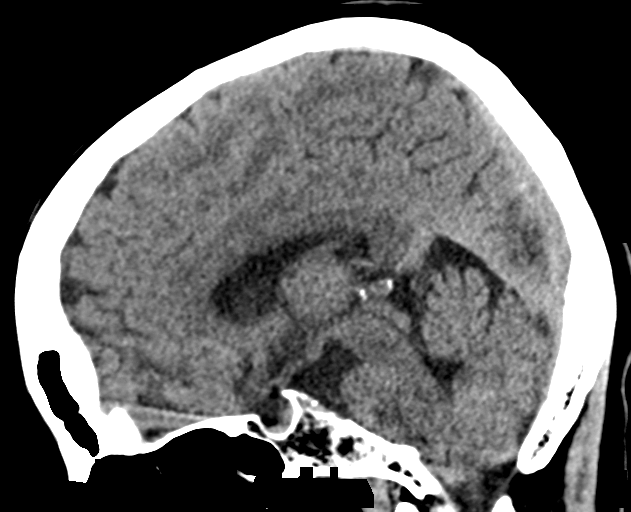
[im 35/53  brain]
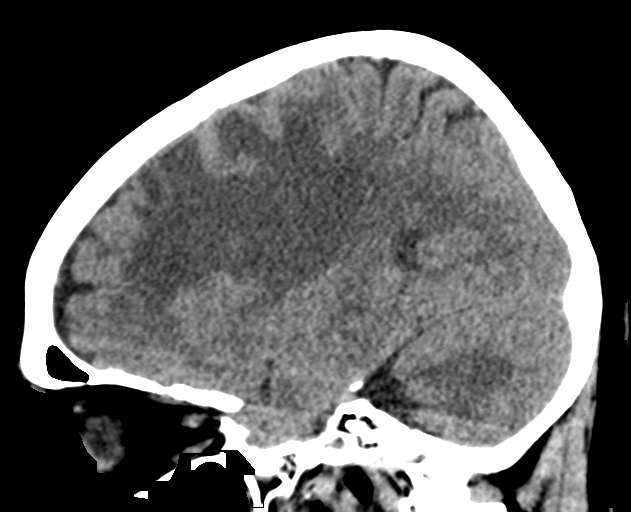

[16 of 47 positions shown; findings below may reference images not displayed]

FINDINGS: Brain: There is hemorrhage in the left frontal lobe measuring 2.7 x
2.0 x 2.8 cm with a large amount surrounding vasogenic edema. This
is likely a mass. Brain parenchyma is otherwise normal. There is no
herniation. 2 mm of rightward midline shift.

Vascular: Negative

Skull: Normal

Sinuses/Orbits: Clear sinuses. Normal orbits.

Other: None
IMPRESSION: Left frontal lobe hemorrhagic mass with surrounding vasogenic edema
and 2 mm of rightward midline shift. MRI with and without contrast
is recommended for further characterization.

Critical Value/emergent results were called by telephone at the time
of interpretation on [DATE] at [DATE] to provider EDSON ARIEL
EDSON ARIEL , who verbally acknowledged these results.

## 2019-06-26 MED ORDER — LORAZEPAM 2 MG/ML IJ SOLN: 2.0000 mg | Freq: Once | INTRAMUSCULAR | Status: AC

## 2019-06-26 MED ORDER — DEXAMETHASONE SODIUM PHOSPHATE 10 MG/ML IJ SOLN
10.0000 mg | Freq: Once | INTRAMUSCULAR | Status: AC
  Administered 2019-06-26: 10 mg via INTRAVENOUS
  Filled 2019-06-26: qty 1

## 2019-06-26 MED ORDER — FENTANYL CITRATE (PF) 100 MCG/2ML IJ SOLN: 100.0000 ug | Freq: Once | INTRAMUSCULAR | Status: AC

## 2019-06-26 MED ORDER — LORAZEPAM 2 MG/ML IJ SOLN
INTRAMUSCULAR | Status: AC
  Administered 2019-06-26: 2 mg via INTRAVENOUS
  Filled 2019-06-26: qty 1

## 2019-06-26 MED ORDER — LABETALOL HCL 5 MG/ML IV SOLN
INTRAVENOUS | Status: AC
  Administered 2019-06-26: 5 mg via INTRAVENOUS
  Filled 2019-06-26: qty 4

## 2019-06-26 MED ORDER — PROPOFOL 1000 MG/100ML IV EMUL
5.0000 ug/kg/min | INTRAVENOUS | Status: DC
  Administered 2019-06-26: 20 ug/kg/min via INTRAVENOUS

## 2019-06-26 MED ORDER — FENTANYL CITRATE (PF) 100 MCG/2ML IJ SOLN
INTRAMUSCULAR | Status: AC
  Administered 2019-06-26: 22:00:00 100 ug via INTRAVENOUS
  Filled 2019-06-26: qty 2

## 2019-06-26 MED ORDER — LORAZEPAM 2 MG/ML IJ SOLN
2.0000 mg | INTRAMUSCULAR | Status: DC | PRN
  Administered 2019-06-26: 2 mg via INTRAVENOUS
  Filled 2019-06-26: qty 1

## 2019-06-26 MED ORDER — PROPOFOL 1000 MG/100ML IV EMUL
INTRAVENOUS | Status: AC
  Administered 2019-06-26: 10 ug/kg/min via INTRAVENOUS
  Filled 2019-06-26: qty 100

## 2019-06-26 MED ORDER — LABETALOL HCL 5 MG/ML IV SOLN: 5.0000 mg | Freq: Once | INTRAVENOUS | Status: AC

## 2019-06-26 MED ORDER — SODIUM CHLORIDE 0.9 % IV BOLUS
500.0000 mL | Freq: Once | INTRAVENOUS | Status: AC
  Administered 2019-06-26: 500 mL via INTRAVENOUS

## 2019-06-26 MED ORDER — PROPOFOL 1000 MG/100ML IV EMUL: 5.0000 ug/kg/min | INTRAVENOUS | Status: DC

## 2019-06-26 MED ORDER — LORAZEPAM 2 MG/ML IJ SOLN
INTRAMUSCULAR | Status: AC
  Filled 2019-06-26: qty 1

## 2019-06-26 MED ORDER — LEVETIRACETAM IN NACL 1000 MG/100ML IV SOLN
1000.0000 mg | Freq: Once | INTRAVENOUS | Status: AC
  Administered 2019-06-26: 1000 mg via INTRAVENOUS

## 2019-06-26 NOTE — ED Provider Notes (Signed)
[facility] Emergency Department Provider Note ____________________________________________   First MD Initiated Contact with Patient 06/26/19 2003     (approximate)  I have reviewed the triage vital signs and the nursing notes.   HISTORY  Chief Complaint Seizures  Level 5 caveat: History present illness limited due to altered mental status  HPI Sara Schroeder is a 69 y.o. female with PMH as noted below who presents with a seizure, acute onset around 6 PM, and mainly with right-sided convulsions and twitching.  She had one seizure at home, 1 with EMS, the third after arriving in the ED.  Per the granddaughter, she has no prior history of seizures.  The granddaughter reports that the patient had not been sleeping much in the last few nights.  Past Medical History:  Diagnosis Date  . Anxiety   . Hypertension     There are no problems to display for this patient.   No past surgical history on file.  Prior to Admission medications   Not on File    Allergies Patient has no known allergies.  No family history on file.  Social History Social History   Tobacco Use  . Smoking status: Never Smoker  . Smokeless tobacco: Never Used  Substance Use Topics  . Alcohol use: Never  . Drug use: Never    Review of Systems Level 5 caveat: Unable to obtain review of systems due to altered mental status    ____________________________________________   PHYSICAL EXAM:  VITAL SIGNS: ED Triage Vitals  Enc Vitals Group     BP 06/26/19 1958 (!) 176/86     Pulse Rate 06/26/19 1958 (!) 113     Resp 06/26/19 1959 (!) 22     Temp 06/26/19 2015 100 F (37.8 C)     Temp Source 06/26/19 2015 Oral     SpO2 06/26/19 1958 98 %     Weight --      Height --      Head Circumference --      Peak Flow --      Pain Score --      Pain Loc --      Pain Edu? --      Excl. in [place]? --     Constitutional: Alert, confused. Eyes: Conjunctivae are normal.  EOMI.   PERRLA. Head: Atraumatic. Nose: No congestion/rhinnorhea. Mouth/Throat: Mucous membranes are moist.   Neck: Normal range of motion.  Cardiovascular: Tachycardic, regular rhythm. Grossly normal heart sounds.  Good peripheral circulation. Respiratory: Normal respiratory effort.  No retractions. Lungs CTAB. Gastrointestinal: Soft and nontender. No distention.  Genitourinary: No flank tenderness. Musculoskeletal: No lower extremity edema.  Extremities warm and well perfused.  Neurologic: Motor intact in all extremities.  Neurologic exam limited due to altered mental status. Skin:  Skin is warm and dry. No rash noted. Psychiatric: Unable to assess due to altered mental status.  ____________________________________________   LABS (all labs ordered are listed, but only abnormal results are displayed)  Labs Reviewed  COMPREHENSIVE METABOLIC PANEL - Abnormal; Notable for the following components:      Result Value   Glucose, Bld 114 (*)    All other components within normal limits  CBC WITH DIFFERENTIAL/PLATELET - Abnormal; Notable for the following components:   WBC 17.9 (*)    Neutro Abs 15.2 (*)    All other components within normal limits  URINALYSIS, COMPLETE (UACMP) WITH MICROSCOPIC - Abnormal; Notable for the following components:   Color, Urine STRAW (*)  APPearance CLEAR (*)    Protein, ur 100 (*)    All other components within normal limits  RESPIRATORY PANEL BY RT PCR (FLU A&B, COVID)  CULTURE, BLOOD (ROUTINE X 2)  CULTURE, BLOOD (ROUTINE X 2)  PROTIME-INR  LACTIC ACID, PLASMA  LACTIC ACID, PLASMA  TROPONIN I (HIGH SENSITIVITY)  TROPONIN I (HIGH SENSITIVITY)   ____________________________________________  EKG  ED ECG REPORT I, Arta Silence, the attending physician, personally viewed and interpreted this ECG.  Date: 06/26/2019 EKG Time: 2004 Rate: 113 Rhythm: Sinus tachycardia QRS Axis: normal Intervals: normal ST/T Wave abnormalities: Nonspecific ST  abnormalities Narrative Interpretation: no evidence of acute ischemia  ____________________________________________  RADIOLOGY  CT head: Left frontal lobe hemorrhagic mass with 2 mm midline shift  ____________________________________________   PROCEDURES  Procedure(s) performed: Yes   Procedure Name: Intubation Date/Time: 06/26/2019 9:24 PM Performed by: Arta Silence, MD Pre-anesthesia Checklist: Patient identified, Patient being monitored, Emergency Drugs available, Timeout performed and Suction available Oxygen Delivery Method: Ambu bag Preoxygenation: Pre-oxygenation with 100% oxygen Induction Type: Rapid sequence Ventilation: Mask ventilation without difficulty Laryngoscope Size: Glidescope and 3 Grade View: Grade I Tube size: 7.5 mm Number of attempts: 1 Placement Confirmation: ETT inserted through vocal cords under direct vision,  CO2 detector and Breath sounds checked- equal and bilateral Secured at: 22 cm Tube secured with: ETT holder       Critical Care performed: Yes  CRITICAL CARE Performed by: Arta Silence   Total critical care time: 45 minutes  Critical care time was exclusive of separately billable procedures and treating other patients.  Critical care was necessary to treat or prevent imminent or life-threatening deterioration.  Critical care was time spent personally by me on the following activities: development of treatment plan with patient and/or surrogate as well as nursing, discussions with consultants, evaluation of patient's response to treatment, examination of patient, obtaining history from patient or surrogate, ordering and performing treatments and interventions, ordering and review of laboratory studies, ordering and review of radiographic studies, pulse oximetry and re-evaluation of patient's condition. ____________________________________________   INITIAL IMPRESSION / ASSESSMENT AND PLAN / ED COURSE  Pertinent labs &  imaging results that were available during my care of the patient were reviewed by me and considered in my medical decision making (see chart for details).  69 year old female with PMH as noted above presents with seizure-like activity, one episode around 6 PM at home, another during EMS transport, third after arriving in the ED.  She has no known history of seizure disorder.  I reviewed the past medical records in [place].  The patient was last seen in early 2020 due to urinary symptoms.  She has no prior brain imaging.  On exam, the patient is alert but confused.  She appears to be moving all extremities although neurologic exam is limited due to her mental status.  The patient's granddaughter is interpreting for her, and the patient does appear to be following commands.  The patient was given Ativan after having another seizure after arrival in the ED.  I have also ordered Keppra.  At this time, the patient is maintaining her airway.  Presentation is concerning for possible intracranial hemorrhage, mass, or new onset seizures.  We will obtain a CT head, lab work-up, and reassess.    ----------------------------------------- 9:27 PM on 06/26/2019 -----------------------------------------  CT shows a hemorrhagic mass in the left frontal lobe with significant edema and some midline shift.  I consulted Dr. Izora Ribas from neurosurgery who reviewed the images.  He  recommends IV dexamethasone in addition to the medications already given, and recommends transfer to [facility].  I contacted the [facility] transfer center and am awaiting consultation with neurosurgery there.  Although the patient became somewhat more awake and conversant, she remained confused and not at her baseline.  Given the multiple seizures, the size of the mass and edema, and the concern for possible rapid deterioration and loss of airway (especially if she requires transfer), I discussed intubation for airway protection with her granddaughter  and the son (over the phone).  The patient was unable to give consent due to her persistent altered mental status and the emergent nature of the procedure.  I did obtain verbal consent from the granddaughter and the patient's son.  The patient was intubated on the first attempt without immediate complications.  We will await response from [facility].  Dr. Izora Ribas advised that if there is no bed availability, the patient could be admitted here while waiting.  ----------------------------------------- 11:06 PM on 06/26/2019 -----------------------------------------  I again touched base with Dr. Cari Caraway.  We are awaiting a decision about possible bed availability at [facility].  The patient clinically remains stable.  She had one more episode of right-sided seizure-like movements approximately 30 minutes after intubation, although this resolved after short time.  She was given additional Ativan.  She was initially hypertensive, but was given IV labetalol and her propofol was increased.  Her blood pressure is now stable.  She has had no additional seizure activity.  I updated her family members, who are here now, on the plan of care.  I signed the patient out to the oncoming physician Dr. Owens Shark.  ____________________________________________   FINAL CLINICAL IMPRESSION(S) / ED DIAGNOSES  Final diagnoses:  Seizures ([place])  Intracranial mass      NEW MEDICATIONS STARTED DURING THIS VISIT:  New Prescriptions   No medications on file     Note:  This document was prepared using Dragon voice recognition software and may include unintentional dictation errors.   Arta Silence, MD 06/26/19 [phone]

## 2019-06-26 NOTE — ED Notes (Signed)
Patient appears to be having another seizure.  Dr. Cherylann Banas notified, administering another 2 mg of ativan.

## 2019-06-26 NOTE — ED Triage Notes (Signed)
69 yo f no pmh of seizures, presenting to ED via [facility] with 2 reported seizures, one with ems en route.  Unilateral and right sided lasting about 15 seconds as witness by ems.  Upon ems arrival to resident, pt was post ictal but able to stand, but hasnt been consisntently following commands.  Had witnessed seizure upon arrival to ED, similar to reported previously.  CBG 121, HTN, no reported recent trauma.  Hx of depression and HTN, bp on arrival 191/95.  MD at bedside, 2 mg ativan given at 2007.

## 2019-06-26 NOTE — Progress Notes (Signed)
RT called to assist with intubation. Patient intubated by ED physician using a #4 glidescope blade with a 7.5 cm ETT. Patient was intubated on first attempt with ETT secured at 22cm at the lip. Bilateral breath sounds equal. Positive color change on EtCO2. Patient placed on ventilator. AC 18 x 450 +5 75%. Current SpO2 is 100%. No ABG ordered at this time per MD. No new orders. Will continue to monitor.

## 2019-06-26 NOTE — ED Notes (Signed)
RSI Notes:    2108: Decision to intubate has been made.  Dr. Cherylann Banas speaking with family, team at bedside preparing at this time.    Pt has 2 IV's, NS running in right AC.  Connected to cardiac monitor with NSR.  Positioned. Suction x 2 set up, BVM connected to oxygen and inflated.   Order verbalized by Dr. Cherylann Banas for 20 mg etomidate, 50 mg rocuronium pulled up and ready.    20 mg of etomidate at 2110. 50 mg of rocuronium at 2110.   Attempt made at at 0912.  +color change with capnography.  7.5 ETT, 22 cm at the lip, right mouth.  Spo2 still at 100%.    Portable chest at bedside.   Mack, RN inserting 16 F OG tube 50 cm

## 2019-06-26 NOTE — ED Notes (Signed)
ED physician notified of decreased BP.  500 cc bolus ordered.

## 2019-06-27 ENCOUNTER — Inpatient Hospital Stay (HOSPITAL_COMMUNITY)
Admission: AD | Admit: 2019-06-27 | Discharge: 2019-07-07 | DRG: 025 | Disposition: A | Discharge status: Rehabilitation Facility | Payer: Medicare Other | Source: Other Acute Inpatient Hospital | Attending: Internal Medicine | Admitting: Internal Medicine

## 2019-06-27 ENCOUNTER — Inpatient Hospital Stay (HOSPITAL_COMMUNITY): Payer: Medicare Other

## 2019-06-27 ENCOUNTER — Encounter (HOSPITAL_COMMUNITY): Payer: Self-pay | Admitting: Pulmonary Disease

## 2019-06-27 ENCOUNTER — Other Ambulatory Visit: Payer: Self-pay

## 2019-06-27 DIAGNOSIS — G40909 Epilepsy, unspecified, not intractable, without status epilepticus: Secondary | ICD-10-CM | POA: Diagnosis present

## 2019-06-27 DIAGNOSIS — R918 Other nonspecific abnormal finding of lung field: Secondary | ICD-10-CM | POA: Diagnosis not present

## 2019-06-27 DIAGNOSIS — I4892 Unspecified atrial flutter: Secondary | ICD-10-CM | POA: Diagnosis not present

## 2019-06-27 DIAGNOSIS — E041 Nontoxic single thyroid nodule: Secondary | ICD-10-CM | POA: Diagnosis present

## 2019-06-27 DIAGNOSIS — G936 Cerebral edema: Secondary | ICD-10-CM | POA: Diagnosis not present

## 2019-06-27 DIAGNOSIS — R4701 Aphasia: Secondary | ICD-10-CM | POA: Diagnosis present

## 2019-06-27 DIAGNOSIS — R1310 Dysphagia, unspecified: Secondary | ICD-10-CM | POA: Diagnosis not present

## 2019-06-27 DIAGNOSIS — R569 Unspecified convulsions: Secondary | ICD-10-CM | POA: Diagnosis not present

## 2019-06-27 DIAGNOSIS — E785 Hyperlipidemia, unspecified: Secondary | ICD-10-CM | POA: Diagnosis present

## 2019-06-27 DIAGNOSIS — R911 Solitary pulmonary nodule: Secondary | ICD-10-CM | POA: Diagnosis not present

## 2019-06-27 DIAGNOSIS — G92 Toxic encephalopathy: Secondary | ICD-10-CM | POA: Diagnosis present

## 2019-06-27 DIAGNOSIS — Z20822 Contact with and (suspected) exposure to covid-19: Secondary | ICD-10-CM | POA: Diagnosis present

## 2019-06-27 DIAGNOSIS — F419 Anxiety disorder, unspecified: Secondary | ICD-10-CM | POA: Diagnosis not present

## 2019-06-27 DIAGNOSIS — J9600 Acute respiratory failure, unspecified whether with hypoxia or hypercapnia: Secondary | ICD-10-CM | POA: Diagnosis not present

## 2019-06-27 DIAGNOSIS — J9601 Acute respiratory failure with hypoxia: Secondary | ICD-10-CM | POA: Diagnosis not present

## 2019-06-27 DIAGNOSIS — I611 Nontraumatic intracerebral hemorrhage in hemisphere, cortical: Secondary | ICD-10-CM | POA: Diagnosis present

## 2019-06-27 DIAGNOSIS — I612 Nontraumatic intracerebral hemorrhage in hemisphere, unspecified: Secondary | ICD-10-CM

## 2019-06-27 DIAGNOSIS — D72829 Elevated white blood cell count, unspecified: Secondary | ICD-10-CM | POA: Diagnosis not present

## 2019-06-27 DIAGNOSIS — C342 Malignant neoplasm of middle lobe, bronchus or lung: Secondary | ICD-10-CM | POA: Diagnosis present

## 2019-06-27 DIAGNOSIS — Z7409 Other reduced mobility: Secondary | ICD-10-CM | POA: Diagnosis not present

## 2019-06-27 DIAGNOSIS — D72828 Other elevated white blood cell count: Secondary | ICD-10-CM | POA: Diagnosis not present

## 2019-06-27 DIAGNOSIS — R000 Tachycardia, unspecified: Secondary | ICD-10-CM

## 2019-06-27 DIAGNOSIS — D496 Neoplasm of unspecified behavior of brain: Secondary | ICD-10-CM | POA: Diagnosis not present

## 2019-06-27 DIAGNOSIS — I10 Essential (primary) hypertension: Secondary | ICD-10-CM | POA: Diagnosis not present

## 2019-06-27 DIAGNOSIS — F329 Major depressive disorder, single episode, unspecified: Secondary | ICD-10-CM | POA: Diagnosis present

## 2019-06-27 DIAGNOSIS — C3411 Malignant neoplasm of upper lobe, right bronchus or lung: Secondary | ICD-10-CM | POA: Diagnosis not present

## 2019-06-27 DIAGNOSIS — D649 Anemia, unspecified: Secondary | ICD-10-CM | POA: Diagnosis present

## 2019-06-27 DIAGNOSIS — G40901 Epilepsy, unspecified, not intractable, with status epilepticus: Secondary | ICD-10-CM | POA: Diagnosis not present

## 2019-06-27 DIAGNOSIS — C801 Malignant (primary) neoplasm, unspecified: Secondary | ICD-10-CM | POA: Diagnosis not present

## 2019-06-27 DIAGNOSIS — G9389 Other specified disorders of brain: Secondary | ICD-10-CM | POA: Diagnosis not present

## 2019-06-27 DIAGNOSIS — C7931 Secondary malignant neoplasm of brain: Secondary | ICD-10-CM | POA: Diagnosis present

## 2019-06-27 DIAGNOSIS — B370 Candidal stomatitis: Secondary | ICD-10-CM | POA: Diagnosis not present

## 2019-06-27 DIAGNOSIS — R946 Abnormal results of thyroid function studies: Secondary | ICD-10-CM

## 2019-06-27 DIAGNOSIS — I619 Nontraumatic intracerebral hemorrhage, unspecified: Secondary | ICD-10-CM | POA: Diagnosis present

## 2019-06-27 DIAGNOSIS — T380X5A Adverse effect of glucocorticoids and synthetic analogues, initial encounter: Secondary | ICD-10-CM | POA: Diagnosis present

## 2019-06-27 DIAGNOSIS — R0989 Other specified symptoms and signs involving the circulatory and respiratory systems: Secondary | ICD-10-CM

## 2019-06-27 LAB — MAGNESIUM: Magnesium: 1.9 mg/dL (ref 1.7–2.4)

## 2019-06-27 LAB — COMPREHENSIVE METABOLIC PANEL
ALT: 17 U/L (ref 0–44)
AST: 26 U/L (ref 15–41)
Albumin: 3.4 g/dL — ABNORMAL LOW (ref 3.5–5.0)
Alkaline Phosphatase: 60 U/L (ref 38–126)
Anion gap: 11 (ref 5–15)
BUN: 15 mg/dL (ref 8–23)
CO2: 19 mmol/L — ABNORMAL LOW (ref 22–32)
Calcium: 8.9 mg/dL (ref 8.9–10.3)
Chloride: 109 mmol/L (ref 98–111)
Creatinine, Ser: 0.67 mg/dL (ref 0.44–1.00)
GFR calc Af Amer: >60 mL/min (ref >60)
GFR calc non Af Amer: >60 mL/min (ref >60)
Glucose, Bld: 147 mg/dL — ABNORMAL HIGH (ref 70–99)
Potassium: 4 mmol/L (ref 3.5–5.1)
Sodium: 139 mmol/L (ref 135–145)
Total Bilirubin: 1.2 mg/dL (ref 0.3–1.2)
Total Protein: 6.1 g/dL — ABNORMAL LOW (ref 6.5–8.1)

## 2019-06-27 LAB — HIV ANTIBODY (ROUTINE TESTING W REFLEX): HIV Screen 4th Generation wRfx: NONREACTIVE

## 2019-06-27 LAB — POCT I-STAT 7, (LYTES, BLD GAS, ICA,H+H)
Acid-base deficit: 3 mmol/L — ABNORMAL HIGH (ref 0.0–2.0)
Bicarbonate: 21 mmol/L (ref 20.0–28.0)
Calcium, Ion: 1.21 mmol/L (ref 1.15–1.40)
HCT: 35 % — ABNORMAL LOW (ref 36.0–46.0)
Hemoglobin: 11.9 g/dL — ABNORMAL LOW (ref 12.0–15.0)
O2 Saturation: 100 %
Patient temperature: 37.3
Potassium: 3.2 mmol/L — ABNORMAL LOW (ref 3.5–5.1)
Sodium: 138 mmol/L (ref 135–145)
TCO2: 22 mmol/L (ref 22–32)
pCO2 arterial: 34.7 mmHg (ref 32.0–48.0)
pH, Arterial: 7.391 (ref 7.350–7.450)
pO2, Arterial: 200 mmHg — ABNORMAL HIGH (ref 83.0–108.0)

## 2019-06-27 LAB — TSH: TSH: 0.124 u[IU]/mL — ABNORMAL LOW (ref 0.350–4.500)

## 2019-06-27 LAB — BLOOD GAS, VENOUS
Acid-base deficit: 1.5 mmol/L (ref 0.0–2.0)
Bicarbonate: 21.3 mmol/L (ref 20.0–28.0)
FIO2: 0.7
MECHVT: 400 mL
O2 Saturation: 99.3 %
PEEP: 5 cmH2O
Patient temperature: 37
RATE: 30 resp/min
pCO2, Ven: 30 mmHg — ABNORMAL LOW (ref 44.0–60.0)
pH, Ven: 7.46 — ABNORMAL HIGH (ref 7.250–7.430)
pO2, Ven: 142 mmHg — ABNORMAL HIGH (ref 32.0–45.0)

## 2019-06-27 LAB — T4, FREE: Free T4: 0.99 ng/dL (ref 0.61–1.12)

## 2019-06-27 LAB — PHOSPHORUS
Phosphorus: 2.9 mg/dL (ref 2.5–4.6)
Phosphorus: 2.5 mg/dL (ref 2.5–4.6)

## 2019-06-27 LAB — GLUCOSE, CAPILLARY
Glucose-Capillary: 154 mg/dL — ABNORMAL HIGH (ref 70–99)
Glucose-Capillary: 164 mg/dL — ABNORMAL HIGH (ref 70–99)

## 2019-06-27 LAB — TRIGLYCERIDES: Triglycerides: 62 mg/dL (ref <150)

## 2019-06-27 LAB — MRSA PCR SCREENING: MRSA by PCR: NEGATIVE

## 2019-06-27 IMAGING — CT CT ABD-PELV W/O CM
2 of 5 series · 13 of 46 positions shown, 15 images · non-contrast
Comparison: Chest radiograph yesterday. CT chest abdomen pelvis
[DATE]

CLINICAL DATA: Brain mass. Lung nodule.

EXAM:
CT CHEST, ABDOMEN AND PELVIS WITHOUT CONTRAST
TECHNIQUE: Multidetector CT imaging of the chest, abdomen and pelvis was
performed following the standard protocol without IV contrast.

[Series 5: cap w/o 2.0 mm st · axial · non-contrast · 0.79mm/px · z∈[-584,-58]mm · 10 of 299 slices shown, 12 images]
[im 18/299  soft-tissue]
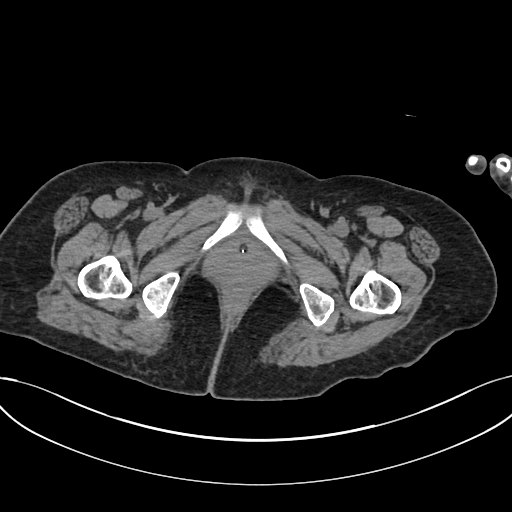
[im 18/299  bone]
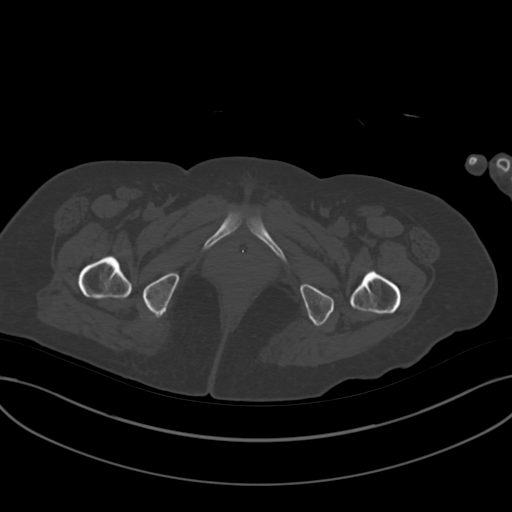
[im 53/299  soft-tissue]
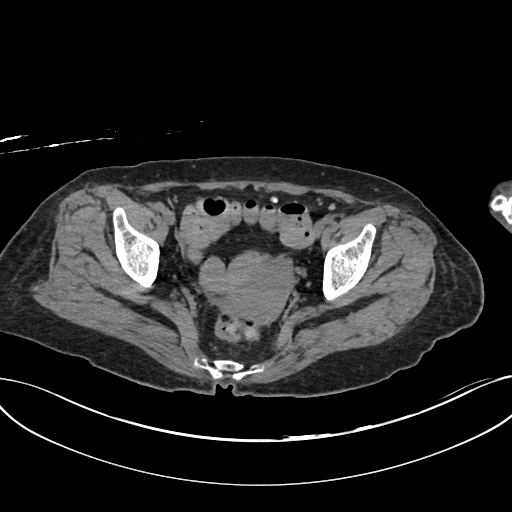
[im 88/299  soft-tissue]
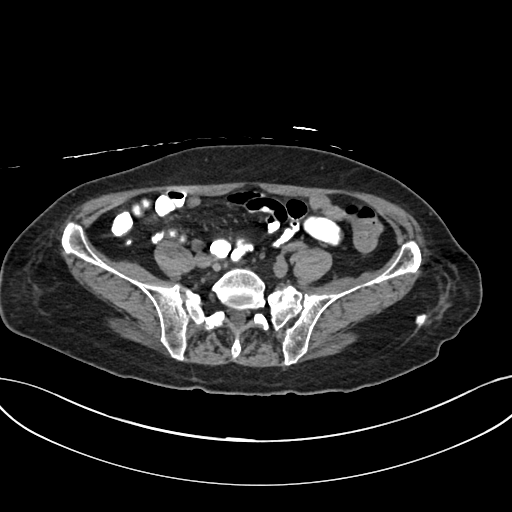
[im 106/299  soft-tissue]
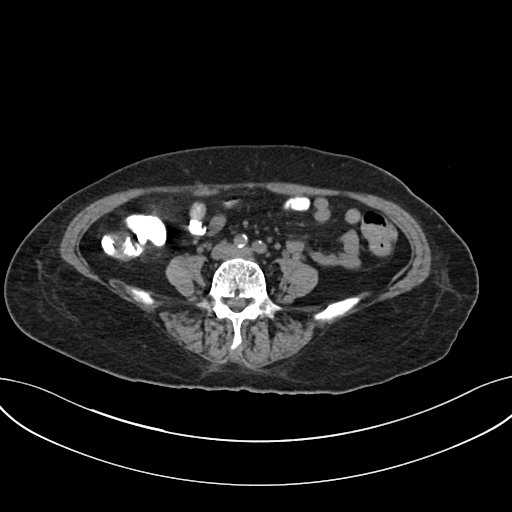
[im 141/299  soft-tissue]
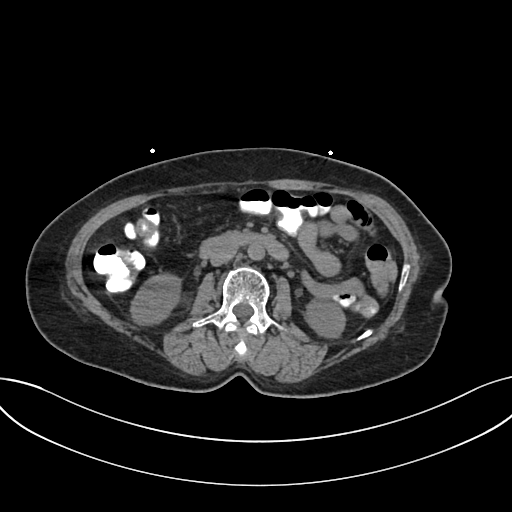
[im 158/299  soft-tissue]
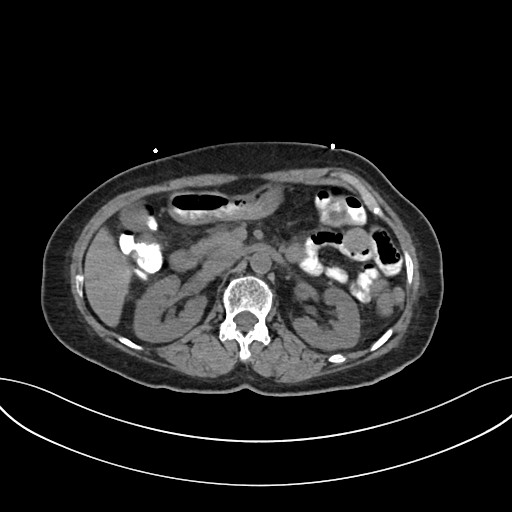
[im 193/299  soft-tissue]
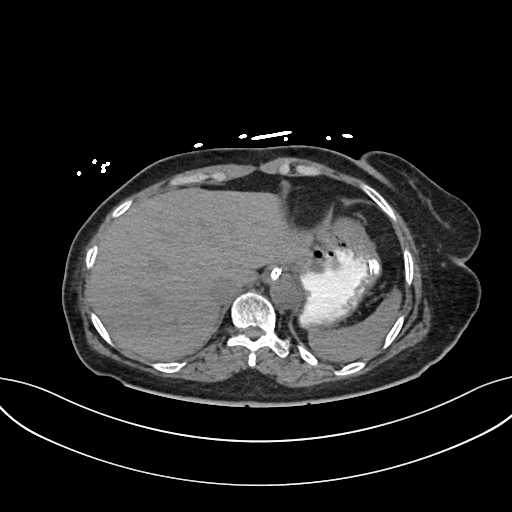
[im 228/299  soft-tissue]
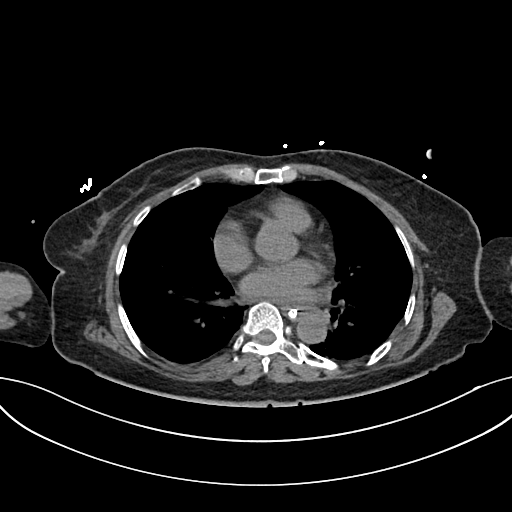
[im 246/299  soft-tissue]
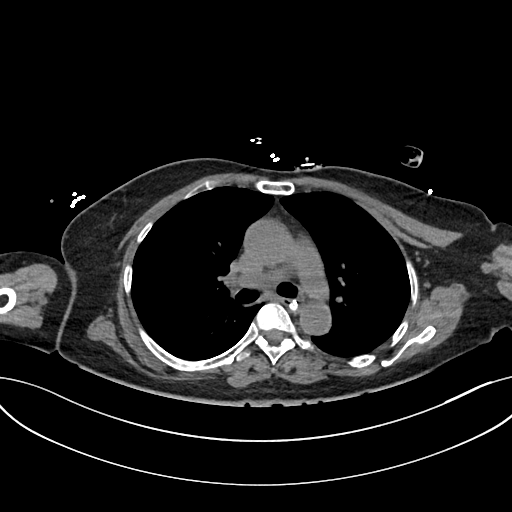
[im 246/299  bone]
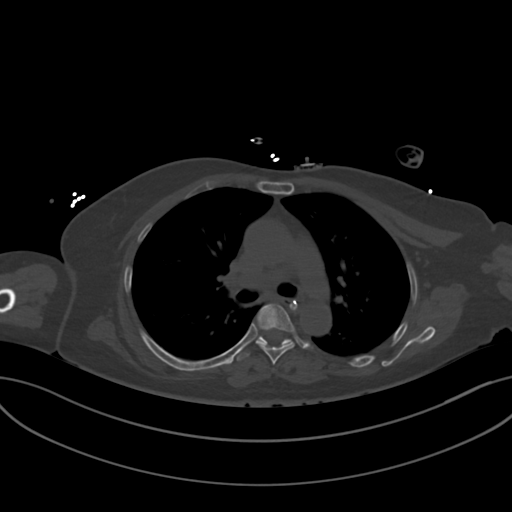
[im 281/299  soft-tissue]
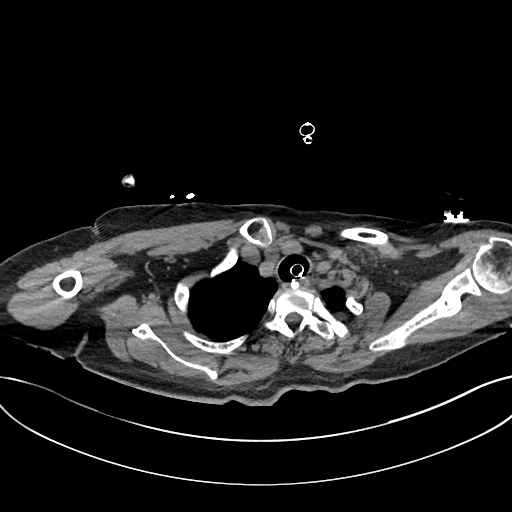

[Series 7: cap w/o 3.0 mm st cor · coronal · non-contrast · 0.68mm/px · 3 of 69 slices shown]
[im 23/69  soft-tissue]
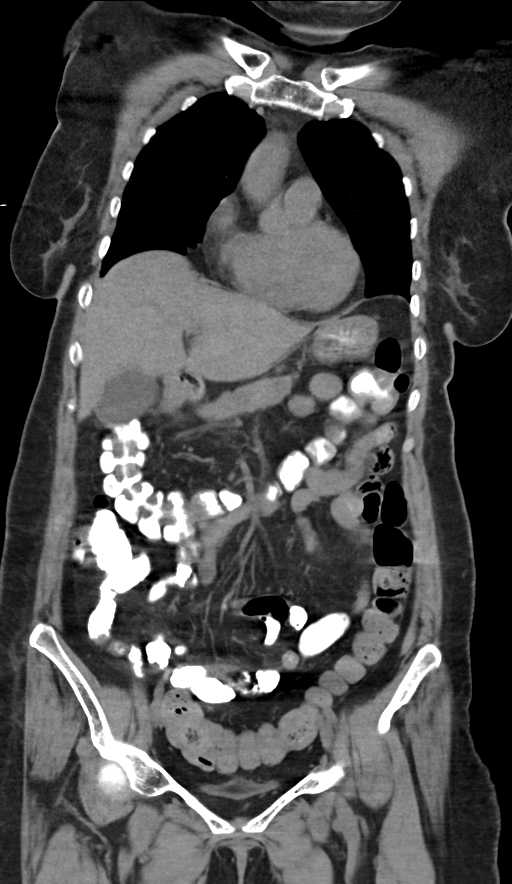
[im 31/69  soft-tissue]
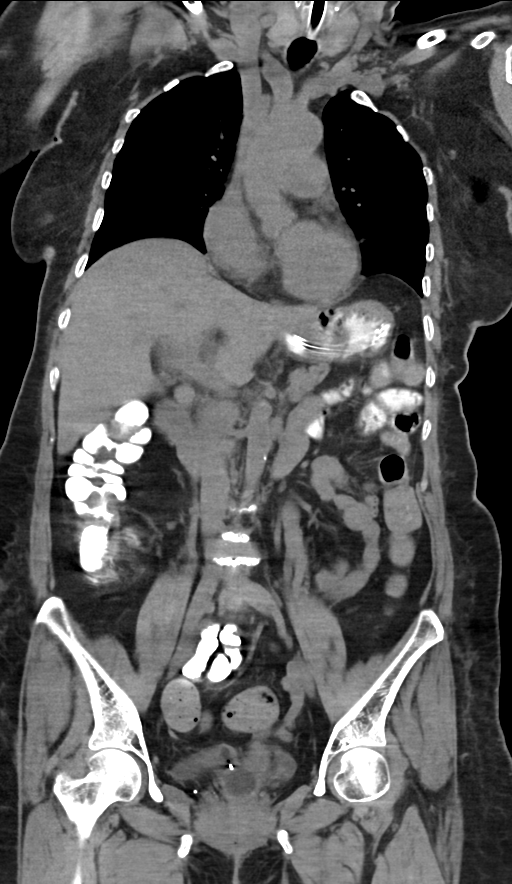
[im 38/69  soft-tissue]
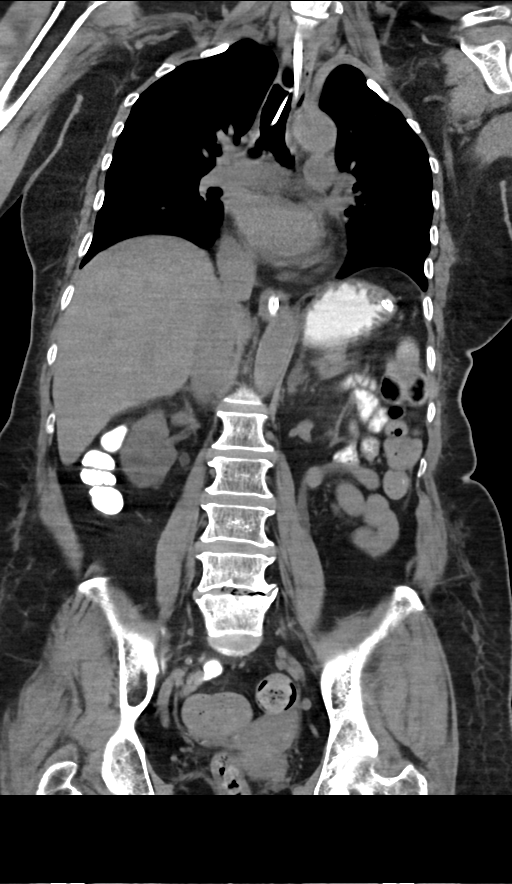

[13 of 46 positions shown; findings below may reference images not displayed]

FINDINGS: CT CHEST FINDINGS

Cardiovascular: Aortic atherosclerosis. No aortic aneurysm. Heart is
normal in size. No pericardial effusion.

Mediastinum/Nodes: Accurate assessment for hilar adenopathy is
limited in the absence of IV contrast. There are no enlarged
mediastinal lymph nodes. Endotracheal tube with tip above the
carina. Enteric tube decompresses the esophagus. There is a 16 mm
nodule that abuts the isthmus of the thyroid, likely unchanged from
prior, on the previous exam portions of this region obscured by
streak artifact from IV contrast. There is no axillary adenopathy.

Lungs/Pleura: Spiculated right middle lobe pulmonary nodule measures
11 x 11 x 10 mm, only minimally increased in size from prior exam.
Margins are irregular and spiculated with small spiculation
extending to the pleural surface anteriorly, minimal pleural
thickening. No other pulmonary nodule or mass. Dependent opacities
in the lung bases favor atelectasis. Trace pleural thickening
without significant effusion.

Musculoskeletal: No blastic or evidence of destructive lytic lesion.
Ordinary degenerative change in the spine. No obvious breast mass.

CT ABDOMEN PELVIS FINDINGS

Hepatobiliary: Lack of IV contrast limits assessment for focal liver
lesion. Capsular calcification involving the dome. No evidence of
focal hepatic lesion. Gallbladder physiologically distended, no
calcified stone. No biliary dilatation.

Pancreas: No ductal dilatation or inflammation. No evidence of focal
lesion on this noncontrast exam.

Spleen: Normal in size without focal abnormality.

Adrenals/Urinary Tract: Slight adrenal thickening without renal
nodule. No hydronephrosis. Mild prominence of both renal collecting
systems. No evidence of focal renal lesion on noncontrast exam.
Urinary bladder is decompressed by Foley catheter.

Stomach/Bowel: Enteric tube tip in the stomach. Contrast and fluid
in the stomach without evidence of focal gastric mass. No small
bowel wall thickening or inflammation. No obstruction, administered
enteric contrast reaches the colon. Normal contrast filled appendix.
Moderate stool distally.

Vascular/Lymphatic: Aortic atherosclerosis. No aortic aneurysm. No
enlarged lymph nodes in the abdomen or pelvis, detailed assessment
limited in the absence of IV contrast. Small calcified lymph node
adjacent to the proximal sigmoid colon, suggesting prior
granulomatous disease.

Reproductive: Uterus and bilateral adnexa are unremarkable. No
evidence of adnexal mass.

Other: No ascites or free fluid. No omental thickening.

Musculoskeletal: No blastic or destructive lytic lesions.
Transitional lumbosacral anatomy. Degenerative disc disease at
L4-L5. There are no acute or suspicious osseous abnormalities.
Calcified granuloma in the subcutaneous soft tissues. No suspicious
skin or subcutaneous lesion.
IMPRESSION: 1. Spiculated right middle lobe pulmonary nodule measuring 11 x 11 x
10 mm, only minimally increased in size from prior exam. This is
suspicious for primary bronchogenic malignancy. Evaluation of the
hila is limited in the absence of IV contrast, allowing for this
limitation, no evidence of nodal metastasis.
2. No other evidence of metastatic disease or primary malignancy in
the chest, abdomen, or pelvis.
3. Exophytic 16 mm nodule from the thyroid isthmus, patient had
previous thyroid uptake and scan at an outside institution that
reported this nodule, however size was not specified, and those
images are not available. No thyroid ultrasounds are available for
review. Therefore, recommend thyroid US (ref: [HOSPITAL]. [DATE]): 143-50), unless performed elsewhere.
4. Dependent opacities in the lung bases favor atelectasis.

Aortic Atherosclerosis ([XC]-[XC]).

## 2019-06-27 IMAGING — DX DG CHEST 1V PORT
1 series · 1 of 1 positions shown · non-contrast
Comparison: Chest x-ray [DATE], [DATE].

CLINICAL DATA: Intubation.

EXAM:
PORTABLE CHEST 1 VIEW

[chest ap]
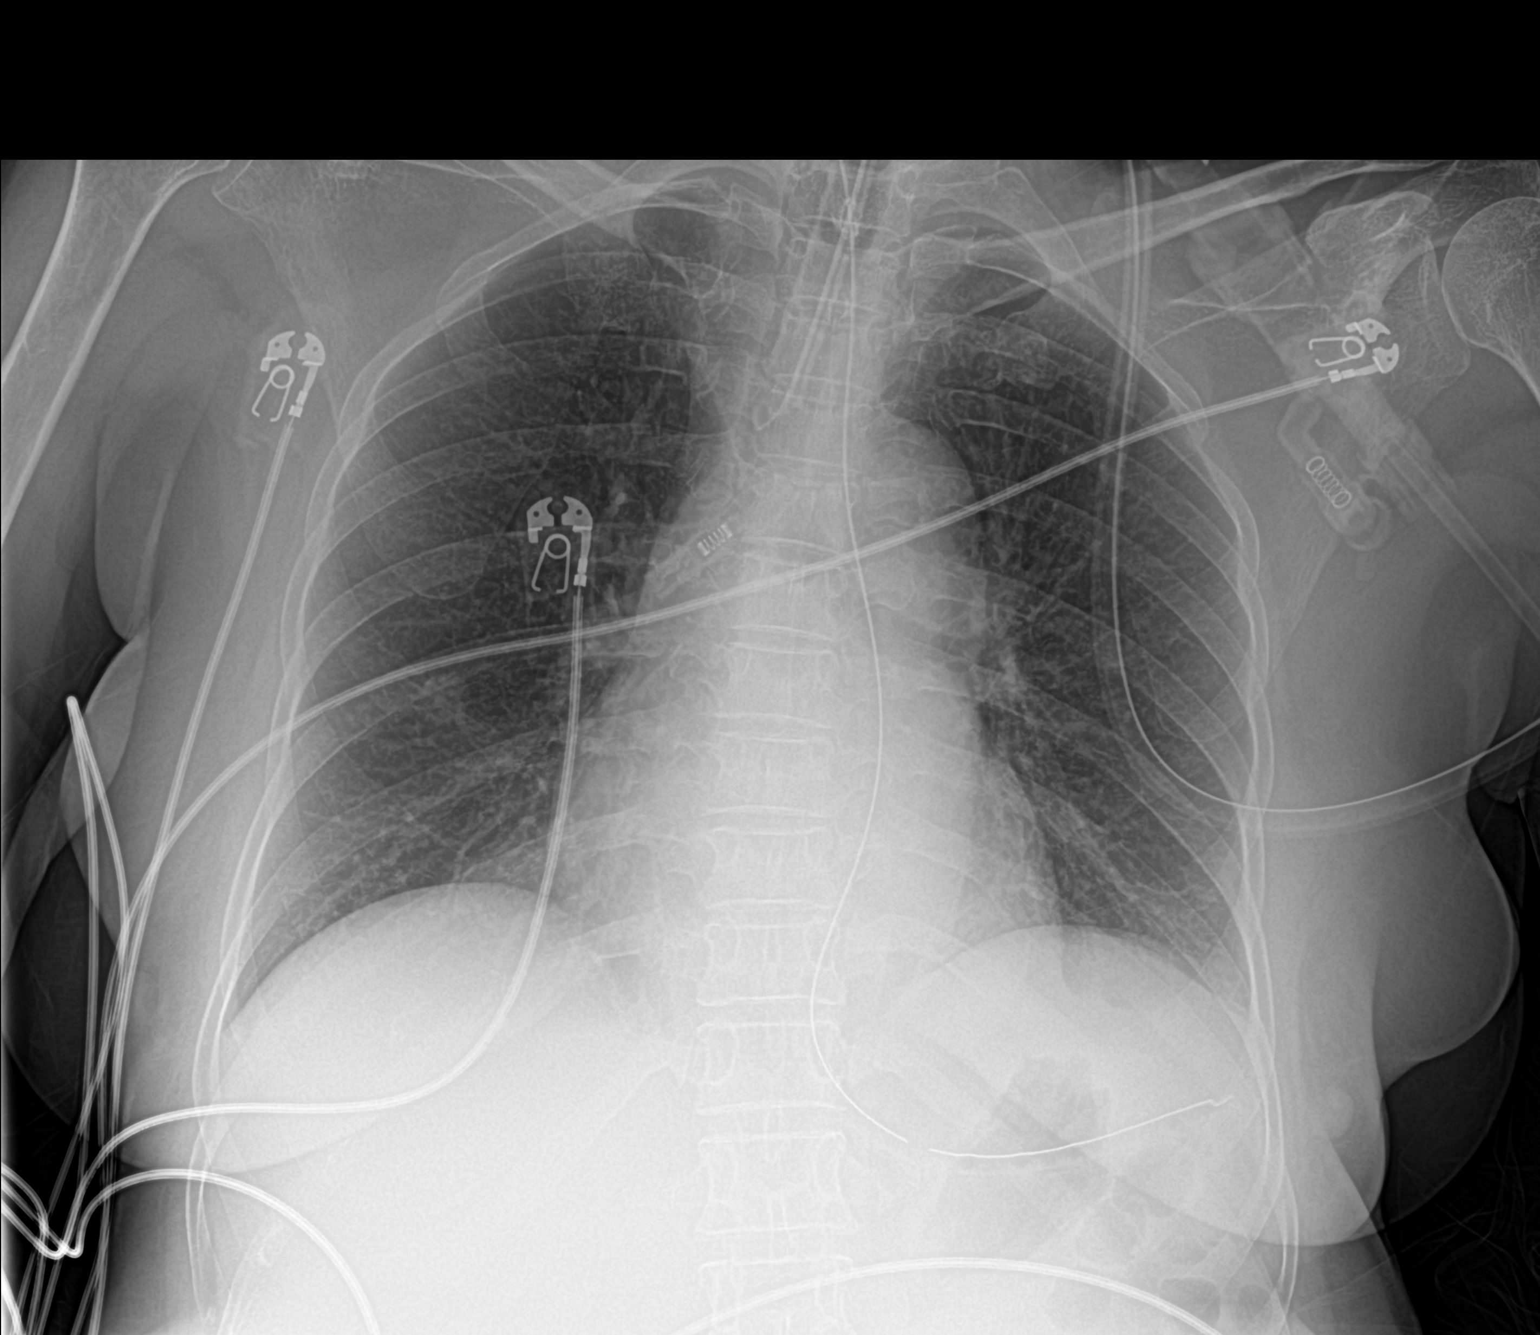

[1 of 1 positions shown; findings below may reference images not displayed]

FINDINGS: Endotracheal tube, NG tube in good anatomic position. NG tube has
been advanced its tip and side hole in the stomach. Heart size
normal. As previously noted on prior study of [DATE] right lower
lobe nodule appears larger. Again further evaluation with chest CT
is suggested. Mild left base subsegmental atelectasis. No pleural
effusion or pneumothorax.
IMPRESSION: 1. Lines and tubes in good anatomic position. NG tube has been
advanced its tip and side hole are within the stomach.

2. As noted on prior study of [DATE] right lower lobe pulmonary
nodule appears larger. Again further evaluation with chest CT
suggested.

These results will be called to the ordering clinician or
representative by the Radiologist Assistant, and communication
documented in the PACS or [REDACTED].

## 2019-06-27 IMAGING — MR MR HEAD WO/W CM
11 of 14 series · 27 of 48 positions shown · IV contrast (gadavist)
Comparison: CT head [DATE]. MRI head without with contrast
[DATE]

CLINICAL DATA: Brain mass.  History of lung nodule.

EXAM:
MRI HEAD WITHOUT AND WITH CONTRAST
TECHNIQUE: Multiplanar, multiecho pulse sequences of the brain and surrounding
structures were obtained without and with intravenous contrast.
CONTRAST:  5.5mL GADAVIST GADOBUTROL 1 MMOL/ML IV SOLN

[Series 4: T2 · axial · 5.0mm · 0.43mm/px · 1 of 25 slices shown]
[im 1/25]
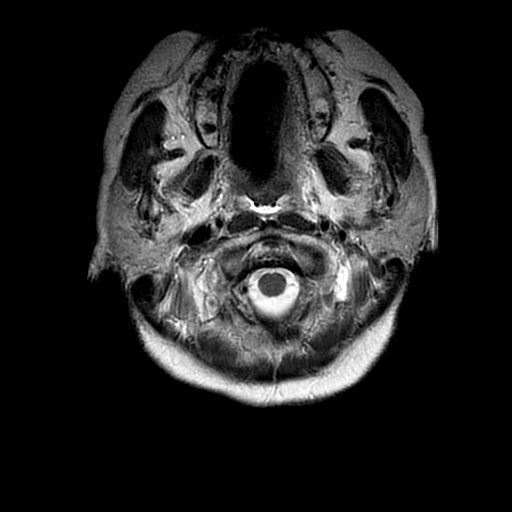

[Series 5: FLAIR · sagittal · 3.0mm · 0.47mm/px · 1 of 43 slices shown (1 of 2)]
[im 1/43]
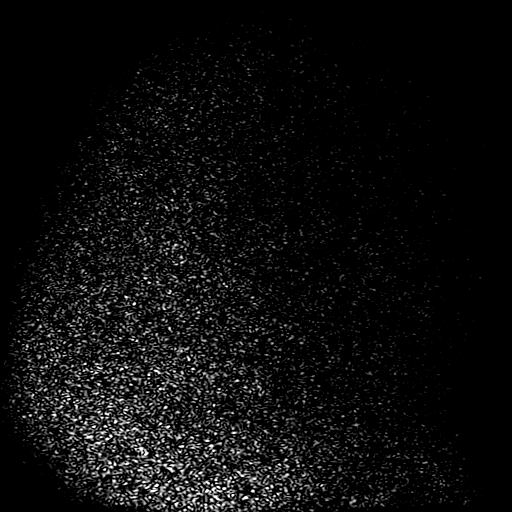

[Series 6: DWI · axial · 3.0mm · 0.94mm/px · z∈[-121,+11]mm · 3 of 100 slices shown]
[im 1/100]
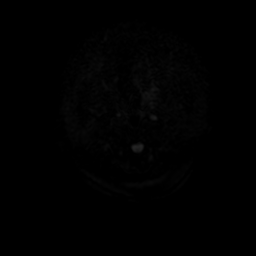
[im 50/100]
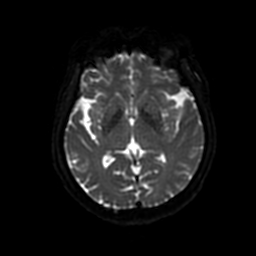
[im 100/100]
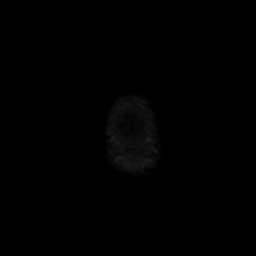

[Series 8: (person_name) · axial · 3.0mm · 0.47mm/px · z∈[-119,+14]mm · 3 of 100 slices shown]
[im 1/100]
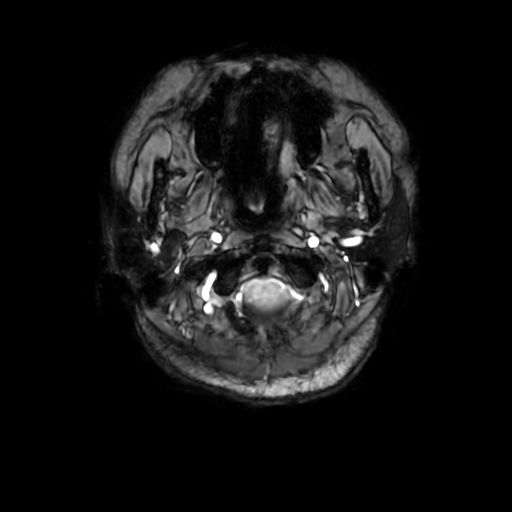
[im 50/100]
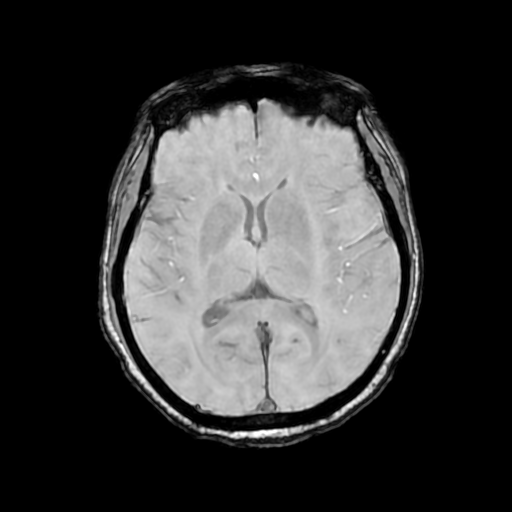
[im 100/100]
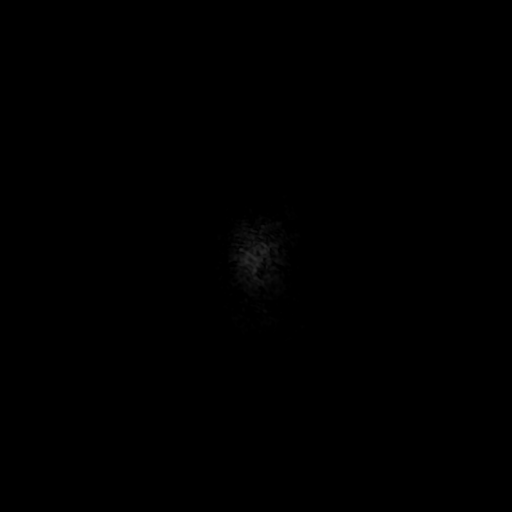

[Series 9: ax 3(person_name) · axial · 1.0mm · 1.02mm/px · z∈[-123,+12]mm · 4 of 150 slices shown (1 of 2)]
[im 1/150]
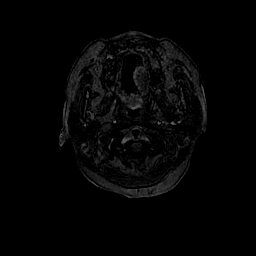
[im 50/150]
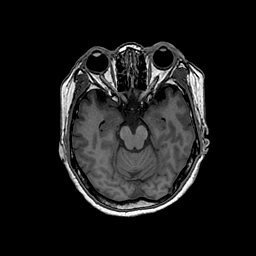
[im 100/150]
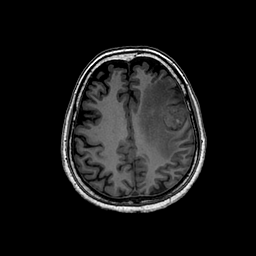
[im 150/150]
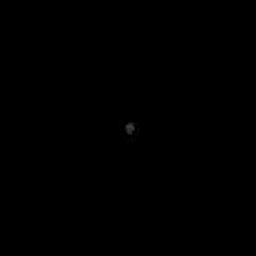

[Series 10: T2 post-contrast · coronal · 3.0mm · 0.39mm/px · 2 of 59 slices shown]
[im 1/59]
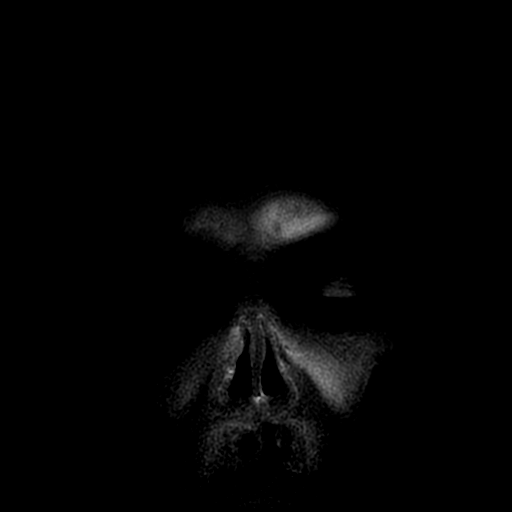
[im 59/59]
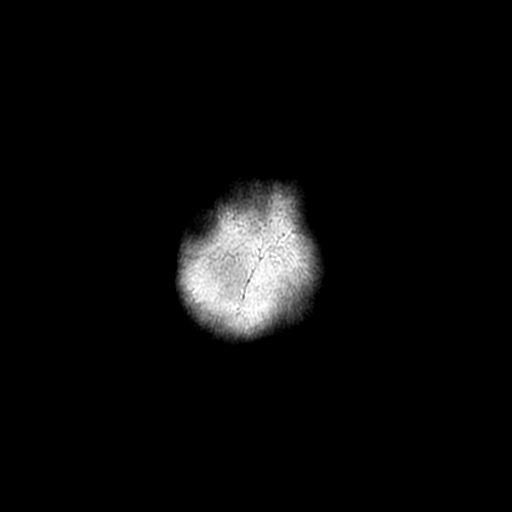

[Series 11: ax 3(person_name) · axial · 1.0mm · 1.02mm/px · z∈[-123,+12]mm · 4 of 150 slices shown (2 of 2)]
[im 1/150]
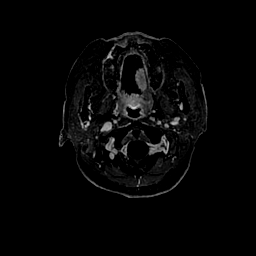
[im 50/150]
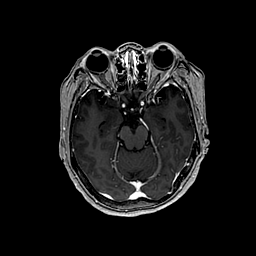
[im 100/150]
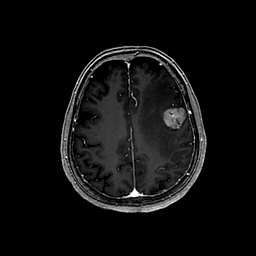
[im 150/150]
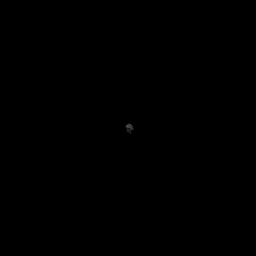

[Series 12: T1 · coronal · 5.0mm · 0.43mm/px · 1 of 31 slices shown]
[im 1/31]
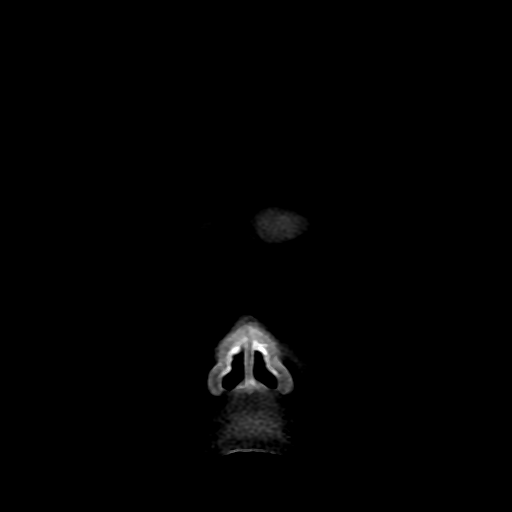

[Series 13: FLAIR · sagittal · 3.0mm · 0.47mm/px · 1 of 43 slices shown (2 of 2)]
[im 1/43]
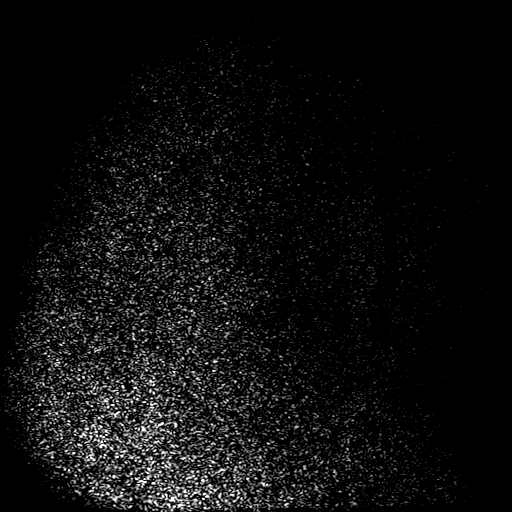

[Series 650: ADC · axial · 3.0mm · 0.94mm/px · 1 of 50 slices shown]
[im 1/50]
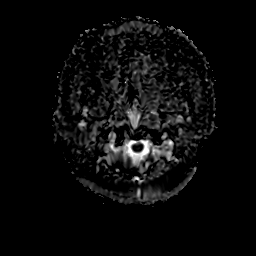

[Series 700: multiplanar reconstruction (mpr) · axial · 1.0mm · 0.50mm/px · z∈[-199,-10]mm · 6 of 256 slices shown]
[im 1/256]
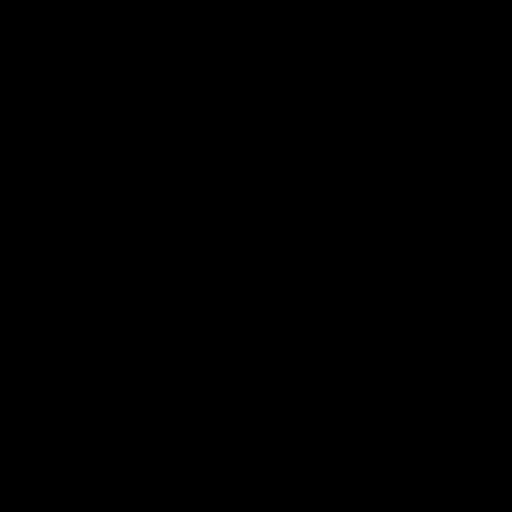
[im 43/256]
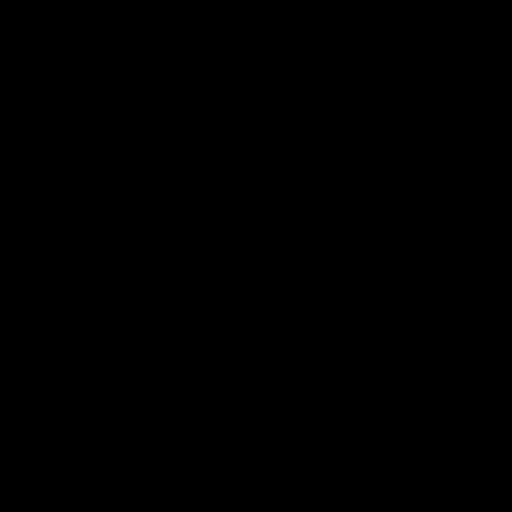
[im 86/256]
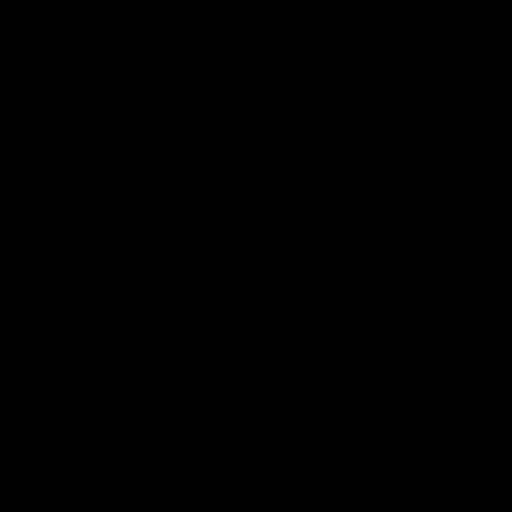
[im 128/256]
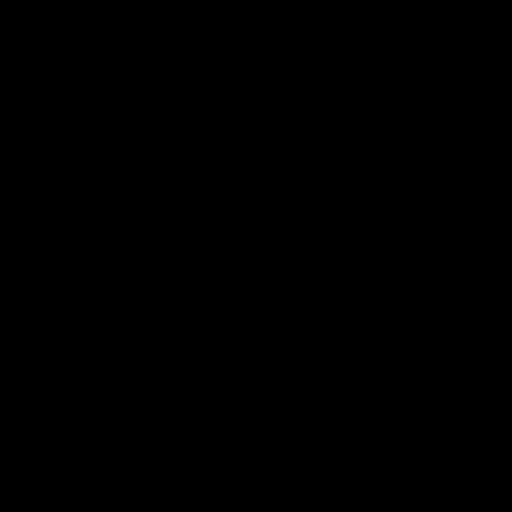
[im 171/256]
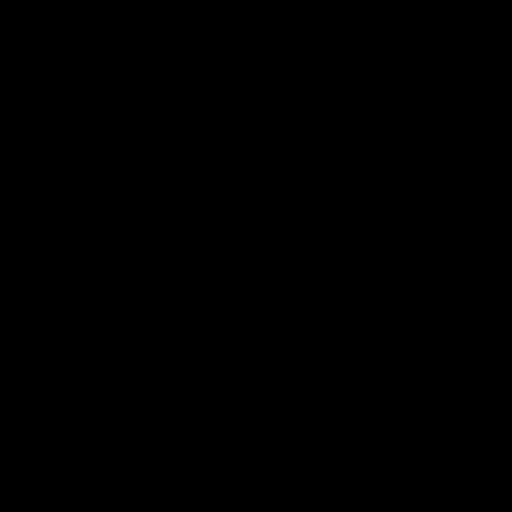
[im 213/256]
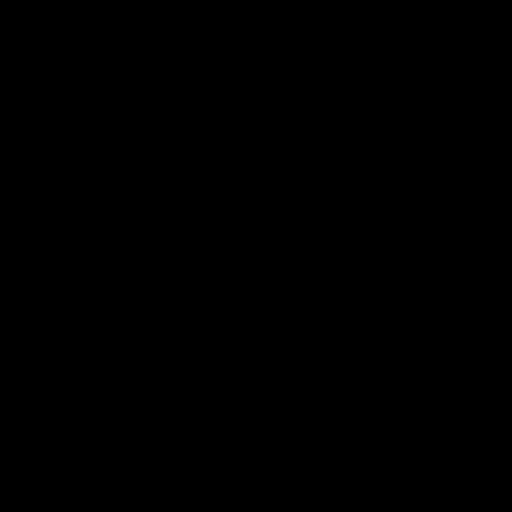

[27 of 48 positions shown; findings below may reference images not displayed]

FINDINGS: Brain: SRS protocol with thin sections performed on 3 tesla MRI.

Solitary mass left frontal lobe with evidence of internal
hemorrhage. The mass measures 2.2 x 2.3 x 2.8 cm. There is extensive
vasogenic edema in the white matter. This is causing mass-effect and
mild midline shift to the right of approximately 2 mm. Ventricle
size normal. No second lesion identified.

Mild chronic microvascular ischemic change in the white matter.
Negative for acute infarct.

Vascular: Normal arterial flow voids

Skull and upper cervical spine: No focal skeletal lesion.

Sinuses/Orbits: Mild mucosal edema paranasal sinuses. Bilateral
orbits

Other: None
IMPRESSION: Solitary hemorrhagic mass left frontal lobe measuring 2.2 x 2.3 x
2.8 cm. Extensive vasogenic edema. This is most likely a solitary
metastatic deposit. Primary brain tumor not likely.

## 2019-06-27 IMAGING — MR MR HEAD WO/W CM
15 of 18 series · 33 of 48 positions shown · IV contrast (gadavist)
Comparison: Head CT from yesterday

CLINICAL DATA: Mass follow-up

EXAM:
MRI HEAD WITHOUT AND WITH CONTRAST
TECHNIQUE: Multiplanar, multiecho pulse sequences of the brain and surrounding
structures were obtained without and with intravenous contrast.
CONTRAST:  6mL GADAVIST GADOBUTROL 1 MMOL/ML IV SOLN

[Series 5: DWI · axial · 3.0mm · 0.88mm/px · z∈[-92,+38]mm · 5 of 90 slices shown (1 of 4)]
[im 1/90]
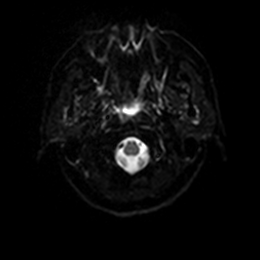
[im 23/90]
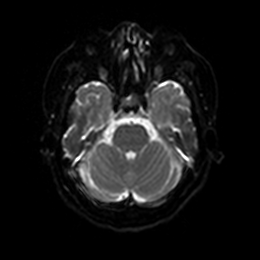
[im 45/90]
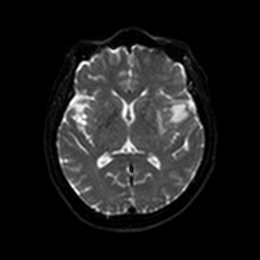
[im 67/90]
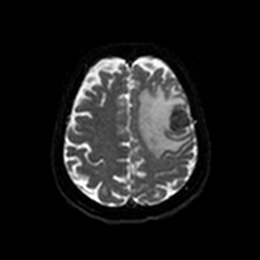
[im 90/90]
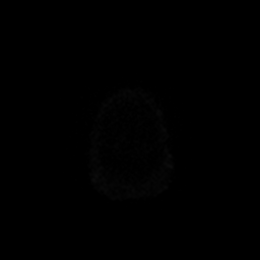

[Series 6: DWI · axial · 3.0mm · 0.88mm/px · z∈[-92,+38]mm · 2 of 45 slices shown (2 of 4)]
[im 1/45]
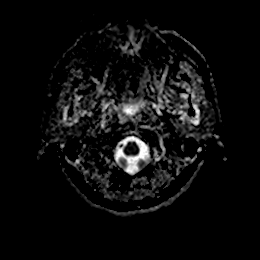
[im 45/45]
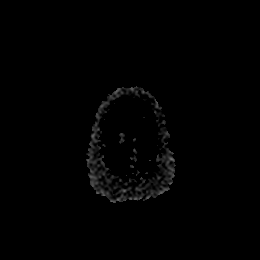

[Series 7: T1 · sagittal · 5.0mm · 0.75mm/px · 2 of 25 slices shown]
[im 1/25]
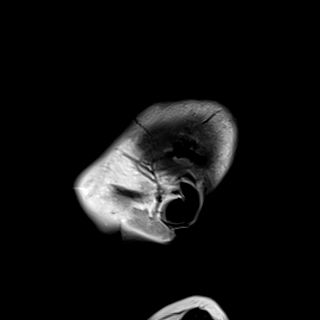
[im 25/25]
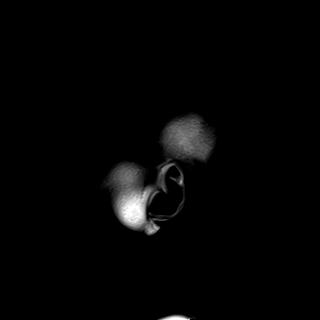

[Series 8: DWI · coronal · 4.0mm · 0.88mm/px · 4 of 66 slices shown (3 of 4)]
[im 1/66]
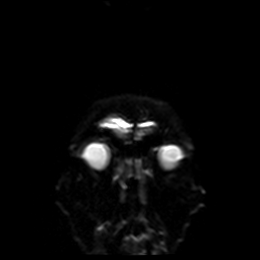
[im 22/66]
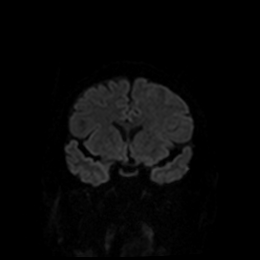
[im 44/66]
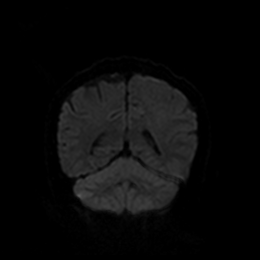
[im 66/66]
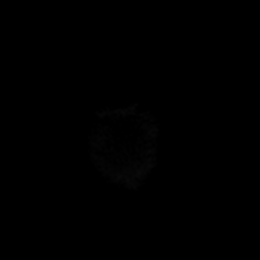

[Series 9: DWI · coronal · 4.0mm · 0.88mm/px · 2 of 33 slices shown (4 of 4)]
[im 1/33]
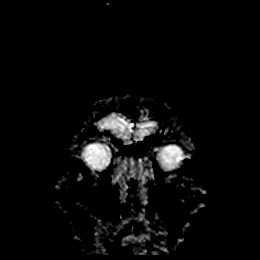
[im 33/33]
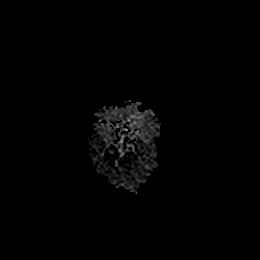

[Series 10: T2 · axial · 5.0mm · 0.72mm/px · z∈[-98,+44]mm · 2 of 25 slices shown (1 of 3)]
[im 1/25]
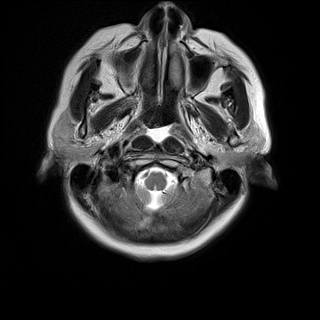
[im 25/25]
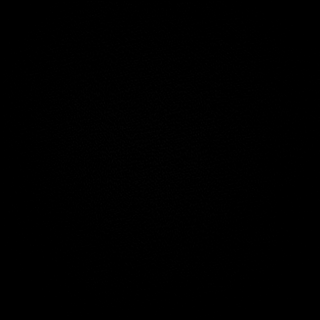

[Series 11: FLAIR · axial · 5.0mm · 0.45mm/px · z∈[-95,+47]mm · 2 of 25 slices shown (1 of 3)]
[im 1/25]
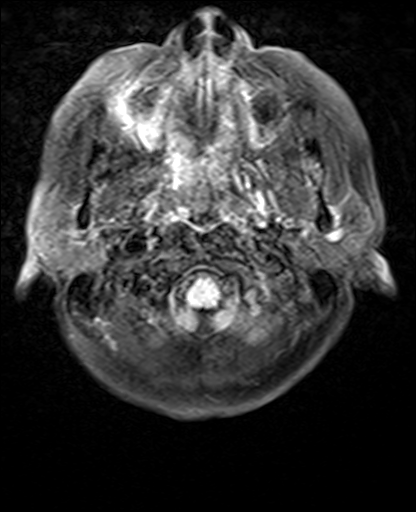
[im 25/25]
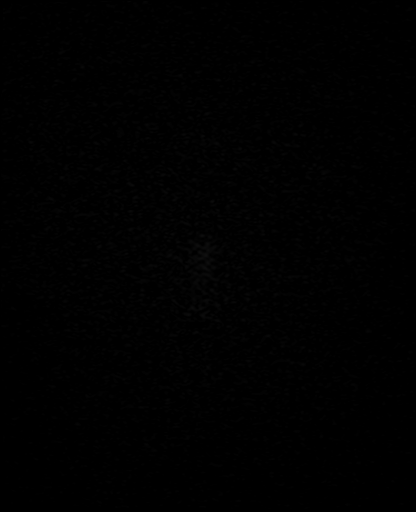

[Series 13: pha_images · axial · 3.0mm · 0.90mm/px · 1 of 56 slices shown]
[im 1/56]
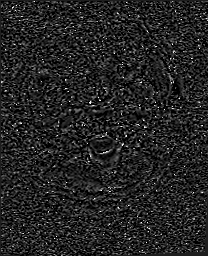

[Series 17: T2 · coronal · 3.0mm · 0.27mm/px · 2 of 32 slices shown (2 of 3)]
[im 1/32]
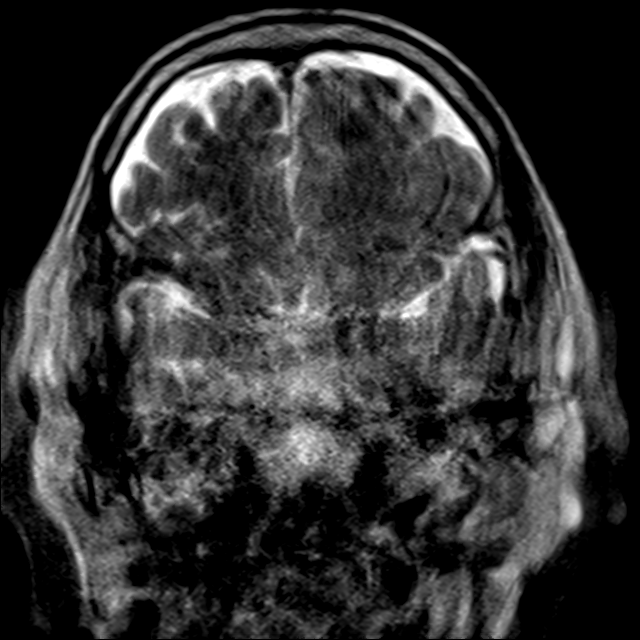
[im 32/32]
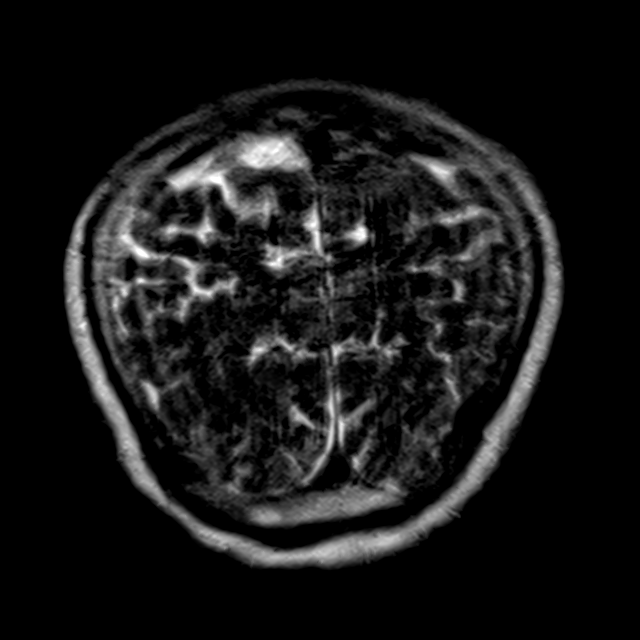

[Series 18: FLAIR · axial · 5.0mm · 0.45mm/px · z∈[-99,+44]mm · 2 of 25 slices shown (2 of 3)]
[im 1/25]
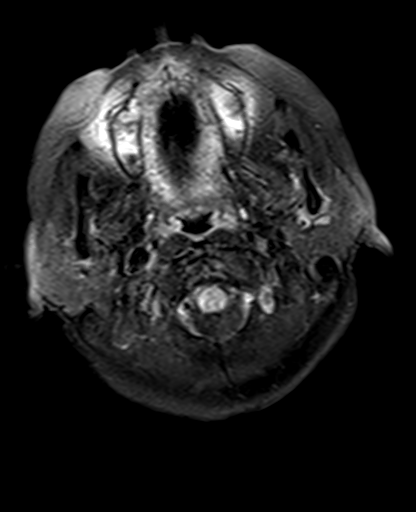
[im 25/25]
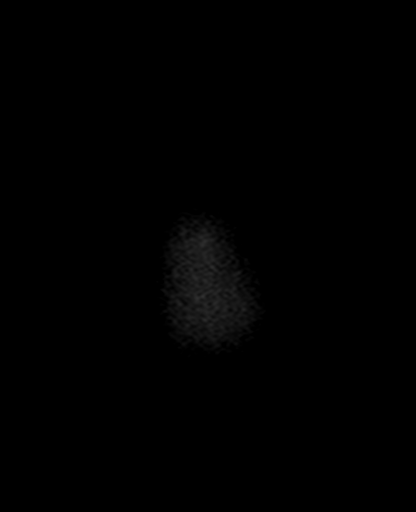

[Series 19: T2 · coronal · 3.0mm · 0.27mm/px · 2 of 32 slices shown (3 of 3)]
[im 1/32]
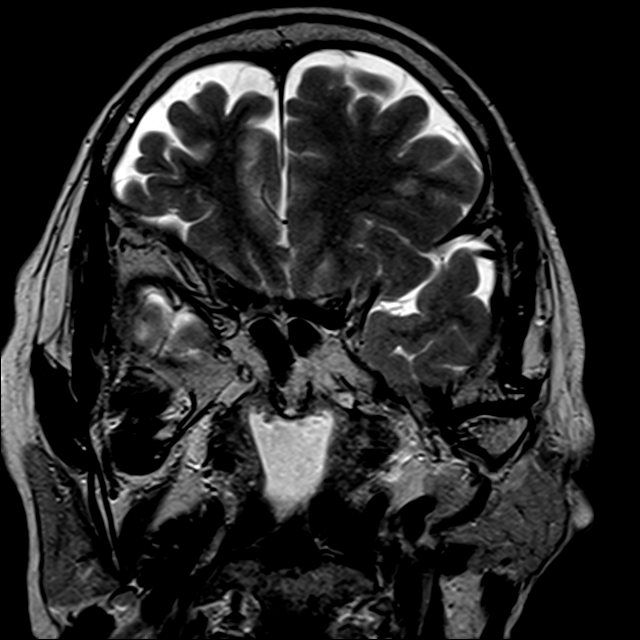
[im 32/32]
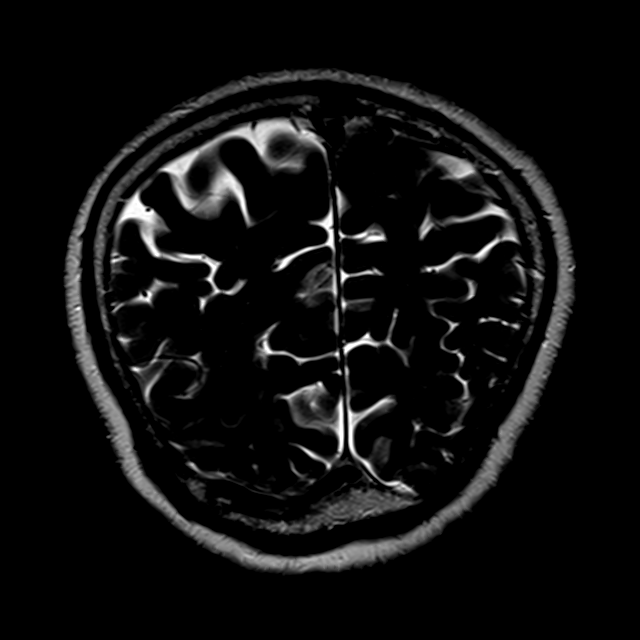

[Series 20: FLAIR · coronal · 3.0mm · 0.56mm/px · 1 of 21 slices shown (3 of 3)]
[im 1/21]
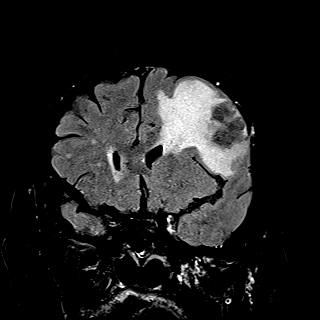

[Series 21: T2 post-contrast · coronal · 5.0mm · 0.72mm/px · 2 of 28 slices shown]
[im 1/28]
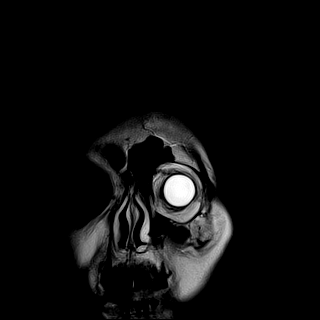
[im 28/28]
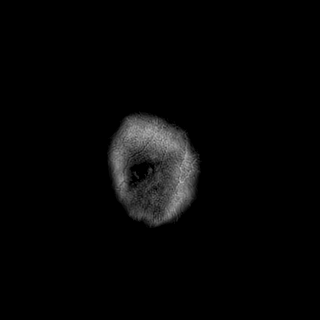

[Series 23: T1 post-contrast · coronal · 5.0mm · 0.34mm/px · 2 of 28 slices shown (1 of 2)]
[im 1/28]
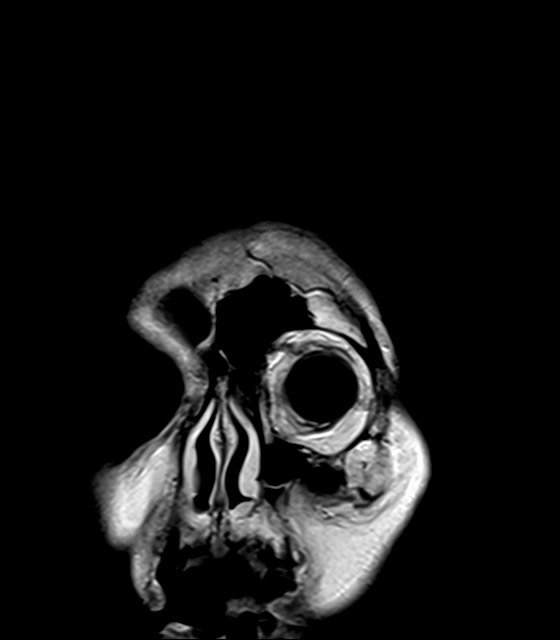
[im 28/28]
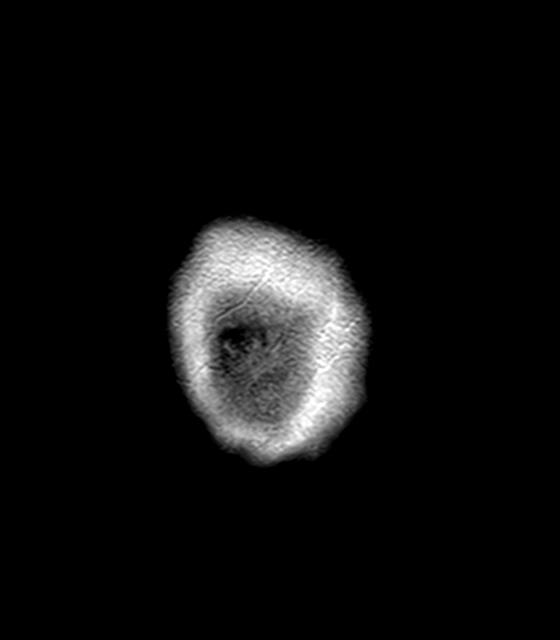

[Series 24: T1 post-contrast · sagittal · 5.0mm · 0.72mm/px · 2 of 25 slices shown (2 of 2)]
[im 1/25]
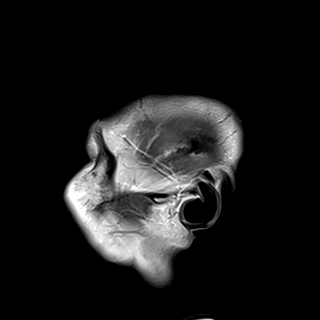
[im 25/25]
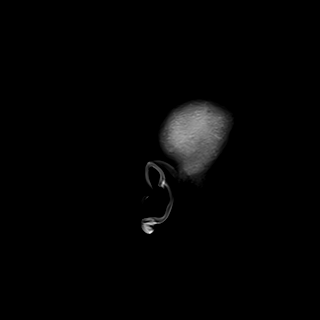

[33 of 48 positions shown; findings below may reference images not displayed]

FINDINGS: Brain: 2.6 cm left lateral frontal mass which appears intra-axial.
The mass is T2 hypointense and mildly dense by CT. There is patchy
T1 shortening compatible with blood products based on gradient
imaging. There is prominent adjacent vasogenic edema. The mass is
diffusely enhancing and not a simple hematoma.

No second mass is seen.  No infarct, hydrocephalus, or collection.

Vascular: Normal flow voids and vascular enhancements

Skull and upper cervical spine: Normal marrow signal

Sinuses/Orbits: Negative
IMPRESSION: 1. Solitary 2.6 cm left lateral frontal mass compatible with
hemorrhagic neoplasm. There was a worrisome pulmonary nodule on a
[DATE] chest CT, updated chest CT may be contributory.
2. Extensive vasogenic edema.

## 2019-06-27 MED ORDER — DEXAMETHASONE SODIUM PHOSPHATE 4 MG/ML IJ SOLN: 4.0000 mg | Freq: Two times a day (BID) | INTRAMUSCULAR | Status: DC

## 2019-06-27 MED ORDER — GADOBUTROL 1 MMOL/ML IV SOLN
6.0000 mL | Freq: Once | INTRAVENOUS | Status: AC | PRN
  Administered 2019-06-27: 6 mL via INTRAVENOUS

## 2019-06-27 MED ORDER — IOHEXOL 9 MG/ML PO SOLN: 500.0000 mL | ORAL | Status: DC

## 2019-06-27 MED ORDER — POTASSIUM CHLORIDE 20 MEQ/15ML (10%) PO SOLN
40.0000 meq | Freq: Two times a day (BID) | ORAL | Status: DC
  Filled 2019-06-27: qty 30

## 2019-06-27 MED ORDER — DEXAMETHASONE SODIUM PHOSPHATE 4 MG/ML IJ SOLN
4.0000 mg | Freq: Four times a day (QID) | INTRAMUSCULAR | Status: DC
  Administered 2019-06-27 – 2019-07-01 (×17): 4 mg via INTRAVENOUS
  Filled 2019-06-27 (×17): qty 1

## 2019-06-27 MED ORDER — CHLORHEXIDINE GLUCONATE CLOTH 2 % EX PADS
6.0000 | MEDICATED_PAD | Freq: Every day | CUTANEOUS | Status: DC
  Administered 2019-06-27: 6 via TOPICAL

## 2019-06-27 MED ORDER — ACETAMINOPHEN 325 MG PO TABS: 650.0000 mg | ORAL_TABLET | ORAL | Status: DC | PRN

## 2019-06-27 MED ORDER — POTASSIUM CHLORIDE 20 MEQ/15ML (10%) PO SOLN
40.0000 meq | Freq: Two times a day (BID) | ORAL | Status: AC
  Administered 2019-06-27 (×2): 40 meq
  Filled 2019-06-27: qty 30

## 2019-06-27 MED ORDER — LORAZEPAM 2 MG/ML IJ SOLN
2.0000 mg | INTRAMUSCULAR | Status: DC | PRN
  Administered 2019-06-27 – 2019-07-02 (×2): 2 mg via INTRAVENOUS
  Filled 2019-06-27 (×3): qty 1

## 2019-06-27 MED ORDER — CHLORHEXIDINE GLUCONATE 0.12% ORAL RINSE (MEDLINE KIT): 15.0000 mL | Freq: Two times a day (BID) | OROMUCOSAL | Status: DC

## 2019-06-27 MED ORDER — ORAL CARE MOUTH RINSE: 15.0000 mL | OROMUCOSAL | Status: DC

## 2019-06-27 MED ORDER — ONDANSETRON HCL 4 MG/2ML IJ SOLN: 4.0000 mg | Freq: Four times a day (QID) | INTRAMUSCULAR | Status: DC | PRN

## 2019-06-27 MED ORDER — LEVETIRACETAM IN NACL 500 MG/100ML IV SOLN
500.0000 mg | Freq: Two times a day (BID) | INTRAVENOUS | Status: DC
  Administered 2019-06-27 – 2019-06-29 (×6): 500 mg via INTRAVENOUS
  Filled 2019-06-27 (×5): qty 100

## 2019-06-27 MED ORDER — PROPOFOL 1000 MG/100ML IV EMUL
0.0000 ug/kg/min | INTRAVENOUS | Status: DC
  Administered 2019-06-27: 35 ug/kg/min via INTRAVENOUS
  Administered 2019-06-27: 23.256 ug/kg/min via INTRAVENOUS
  Administered 2019-06-27: 25 ug/kg/min via INTRAVENOUS
  Administered 2019-06-28 (×2): 50 ug/kg/min via INTRAVENOUS
  Filled 2019-06-27 (×5): qty 100

## 2019-06-27 MED ORDER — POLYETHYLENE GLYCOL 3350 17 G PO PACK
17.0000 g | PACK | Freq: Every day | ORAL | Status: DC | PRN
  Administered 2019-07-02 – 2019-07-04 (×2): 17 g
  Filled 2019-06-27 (×2): qty 1

## 2019-06-27 MED ORDER — PANTOPRAZOLE SODIUM 40 MG IV SOLR
40.0000 mg | Freq: Every day | INTRAVENOUS | Status: DC
  Administered 2019-06-27 – 2019-07-01 (×5): 40 mg via INTRAVENOUS
  Filled 2019-06-27 (×5): qty 40

## 2019-06-27 MED ORDER — VITAL AF 1.2 CAL PO LIQD
1000.0000 mL | ORAL | Status: DC
  Administered 2019-06-27: 1000 mL

## 2019-06-27 MED ORDER — LACTATED RINGERS IV SOLN: INTRAVENOUS | Status: DC

## 2019-06-27 MED ORDER — FENTANYL CITRATE (PF) 100 MCG/2ML IJ SOLN
25.0000 ug | INTRAMUSCULAR | Status: DC | PRN
  Administered 2019-06-27 – 2019-06-29 (×7): 100 ug via INTRAVENOUS
  Administered 2019-06-29: 50 ug via INTRAVENOUS
  Administered 2019-06-30: 100 ug via INTRAVENOUS
  Administered 2019-06-30: 25 ug via INTRAVENOUS
  Filled 2019-06-27 (×11): qty 2

## 2019-06-27 MED ORDER — DOCUSATE SODIUM 50 MG/5ML PO LIQD
100.0000 mg | Freq: Two times a day (BID) | ORAL | Status: DC
  Administered 2019-06-27 – 2019-06-29 (×4): 100 mg
  Filled 2019-06-27 (×6): qty 10

## 2019-06-27 MED ORDER — POLYETHYLENE GLYCOL 3350 17 G PO PACK: 17.0000 g | PACK | Freq: Every day | ORAL | Status: DC | PRN

## 2019-06-27 MED ORDER — CHLORHEXIDINE GLUCONATE 0.12% ORAL RINSE (MEDLINE KIT)
15.0000 mL | Freq: Two times a day (BID) | OROMUCOSAL | Status: DC
  Administered 2019-06-27 – 2019-06-30 (×7): 15 mL via OROMUCOSAL

## 2019-06-27 MED ORDER — GADOBUTROL 1 MMOL/ML IV SOLN
5.5000 mL | Freq: Once | INTRAVENOUS | Status: AC | PRN
  Administered 2019-06-27: 17:00:00 5.5 mL via INTRAVENOUS

## 2019-06-27 MED ORDER — IOHEXOL 9 MG/ML PO SOLN
500.0000 mL | ORAL | Status: AC
  Administered 2019-06-27 (×2): 500 mL

## 2019-06-27 MED ORDER — ORAL CARE MOUTH RINSE
15.0000 mL | OROMUCOSAL | Status: DC
  Administered 2019-06-27 – 2019-06-30 (×31): 15 mL via OROMUCOSAL

## 2019-06-27 MED ORDER — FENTANYL CITRATE (PF) 100 MCG/2ML IJ SOLN: 25.0000 ug | INTRAMUSCULAR | Status: DC | PRN

## 2019-06-27 MED ORDER — PRO-STAT SUGAR FREE PO LIQD
30.0000 mL | Freq: Every day | ORAL | Status: DC
  Administered 2019-06-28: 30 mL
  Filled 2019-06-27: qty 30

## 2019-06-27 MED ORDER — DOCUSATE SODIUM 100 MG PO CAPS: 100.0000 mg | ORAL_CAPSULE | Freq: Two times a day (BID) | ORAL | Status: DC | PRN

## 2019-06-27 NOTE — Progress Notes (Signed)
Patient taken to MRI. RN brought ativan in case of seizure activity in machine. Patient became agitated when in MRI machine, ativan given for RASS goal and not for seizures.

## 2019-06-27 NOTE — Progress Notes (Signed)
Wasted 100mg  fentanyl in sharps with Lianne Bushy RN.
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
## 2019-06-27 NOTE — Progress Notes (Signed)
Explained visitation policy to son, Pricilla Holm, as father does not speak English, we will allow one son at bedside with father for a total of TWO visitors at a time.  Visitors: Felecia Jan- son Pricilla Holm- son Anthonette Legato- husband

## 2019-06-27 NOTE — Consult Note (Signed)
Reason for Consult: Brain tumor Referring Physician: CCM  Sara Schroeder is an 69 y.o. female.   HPI:  69 year old female who presented to an outside hospital yesterday with seizure activity.  CT scan showed a nonhemorrhagic brain mass with significant peritumoral edema.  She was intubated for airway protection and her seizures were controlled with Ativan and Keppra.  She is on propofol here and received Ativan for her MRI which she just completed.  There is no exam available.  The nurses state that during her wake up exams she moves everything and reaches for her tube purposefully.  Past Medical History:  Diagnosis Date  . Anxiety   . Hypertension     History reviewed. No pertinent surgical history.  No Known Allergies  Social History   Tobacco Use  . Smoking status: Never Smoker  . Smokeless tobacco: Never Used  Substance Use Topics  . Alcohol use: Never    History reviewed. No pertinent family history.   Review of Systems  Positive ROS: Unable to obtain there her son states that she does not sleep well.  All other systems have been reviewed and were otherwise negative with the exception of those mentioned in the HPI and as above.  Objective: Vital signs in last 24 hours: Temp:  [98.2 F (36.8 C)-100.1 F (37.8 C)] 98.2 F (36.8 C) (05/04 0500) Pulse Rate:  [79-141] 86 (05/04 0700) Resp:  [0-30] 20 (05/04 0700) BP: (82-191)/(51-138) 118/61 (05/04 0700) SpO2:  [97 %-100 %] 100 % (05/04 0700) FiO2 (%):  [40 %-75 %] 40 % (05/04 0316) Weight:  [43 kg-57.8 kg] 57.8 kg (05/04 0315)  General Appearance: Intubated female Head: Normocephalic, without obvious abnormality, atraumatic Eyes: PERRL     Throat: Intubated Neck: Supple, symmetrical Lungs: Clear to auscultation bilaterally, respirations unlabored Heart: Regular rate and rhythmp Abdomen: Soft Extremities: Extremities normal, atraumatic, no cyanosis or edema Pulses: 2+ and symmetric all extremities Skin: Skin color,  texture, turgor normal, no rashes or lesions  NEUROLOGIC:   Mental status: Intubated and sedated Motor Exam -unable to examine Sensory Exam -unable to examine Reflexes: symmetric Gait -unable to examine Balance -unable to examine Cranial Nerves: I: smell Not tested  II: visual acuity  OS: na    OD: na  II: visual fields   II: pupils Equal, round, reactive to light  III,VII: ptosis   III,IV,VI: extraocular muscles    V: mastication   V: facial light touch sensation    V,VII: corneal reflex    VII: facial muscle function - upper    VII: facial muscle function - lower   VIII: hearing   IX: soft palate elevation    IX,X: gag reflex   XI: trapezius strength    XI: sternocleidomastoid strength   XI: neck flexion strength    XII: tongue strength      Data Review Lab Results  Component Value Date   WBC 17.9 (H) 06/26/2019   HGB 11.9 (L) 06/27/2019   HCT 35.0 (L) 06/27/2019   MCV 91.7 06/26/2019   PLT 230 06/26/2019   Lab Results  Component Value Date   NA 138 06/27/2019   K 3.2 (L) 06/27/2019   CL 103 06/26/2019   CO2 26 06/26/2019   BUN 19 06/26/2019   CREATININE 0.67 06/26/2019   GLUCOSE 114 (H) 06/26/2019   Lab Results  Component Value Date   INR 0.9 06/26/2019    Radiology: No results found.   Assessment/Plan: Estimated body mass index is 23.31 kg/m as  calculated from the following:   Height as of this encounter: 5\' 2"  (1.575 m).   Weight as of this encounter: 56.33 kg.   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  I have had a long talk with the son.  They prefer [facility] if possible.  I am certainly okay with you arranging transfer to [facility] neurosurgery if beds become available.  We are also certainly happy to help here.  She is going to need a left frontal craniotomy for resection of the lesion followed most likely by adjuvant  care.  Preoperatively I recommend a CT scan of the chest abdomen pelvis to rule out a primary.  Bone scan could be helpful but is less imperative.  If she has surgery here, she will need further imaging with BrainLab protocol, either MRI or CT scan  Continue seizure prophylaxis.  Decadron for peritumoral edema.   Eustace Moore 06/27/2019 8:11 AM

## 2019-06-27 NOTE — H&P (Addendum)
NAMEMariona Schroeder, MRN:  [id number], DOB:  [birth year], LOS: 0 ADMISSION DATE:  (Not on file), CONSULTATION DATE:  06/27/19 REFERRING MD:  [facility] , CHIEF COMPLAINT:  Seizure  Brief History   69 yo F presented to [facility] with new onset seizure. Found to have L frontal lobe hemorrhage, likely hemorrhagic mass. Intubated due to recurring seizures   History of present illness   69 yo F PMH HTN, depression who presented to [facility] ED 5/3 with new onset seizure. Initial seizure onset at 6pm, R sided rhythmic convulsions. EMS was dispatched and during transport to ED, pt sustained a second seizure, and continued to exhibit seizure activity in ED. Pt given IV ativan for limitation of seizure. Noted to remain altered/post-ictal following. Given Keppra bolus. Ultimately intubated for airway protection due to recurring seizures. CT H acquired whch revealed L frontal lobe hemorrhagic mass. Family denies hx of seizures in the patient but states she has not been sleeping well lately.   [facility] ED labs grossly unremarkable but with WBC 17.9  Transferred to [facility] Neuro ICU 06/27/19    Past Medical History  Depression UTI HTN  Significant Hospital Events   5/3> multiple seizures, intubated for airway protection. L frontal hemorrhage (likely mass) on [facility] 5/4> admitted to [facility] Neuro ICU  Consults:  NSGY consulted 5/4  Procedures:  06/26/19 ETT>>   Significant Diagnostic Tests:  5/3 CT Head noncon> L frontal lobe hemorrhage 2.7x2.0 x 2.8 cm with surrounding edema. 59mm rightward midline shift. No herniation.  5/3 CXR> ETT 29mm above carina. Enteric tube above JE junction. Recommend retraction of ETT 2-3 cm, advancement of Enteric tube 8 cm.  Micro Data:  5/3 SARS Cov2> neg  5/3 Flu A/B> neg   Antimicrobials:    Interim history/subjective:  Arrives to [facility] from [facility] ED  Son at bedside-- reveals pts other son has recently had [facility] for brain tumor at [facility]-- asking if patient can be transferred to [facility]  if a bed comes available there in hopes of being evaluated by same neurosurgeon who performed family members surgery   Objective   There were no vitals taken for this visit.    Vent Mode: AC FiO2 (%):  [70 %-75 %] 70 % Set Rate:  [18 bmp-30 bmp] 30 bmp Vt Set:  [400 mL] 400 mL PEEP:  [5 cmH20] 5 cmH20  No intake or output data in the 24 hours ending 06/27/19 0253 There were no vitals filed for this visit.  Examination: General: WDWN older adult F, intubated and sedated  NAD HENT: NCAT Pink mmm anicteric sclera. ETT secure Lungs: CTA bilaterally. Symmetrical chest expansion.  Cardiovascular: RRR s1s2 no rgm cap refill < 3 sec BUE BLE 2+ radial pulses  Abdomen: soft flat ndnt normoactive x4 Extremities: Symmetrical bulk and tone. No obvious joint deformity. No cyanosis or clubbing BUE mittens  Neuro: Sedated on propofol at time of exam. PERRL 73mm sluggish. Moves BUE BLE spontaneously. Does not follow commands  GU: WNL   Resolved Hospital Problem list     Assessment & Plan:   L Frontal Lobe ICH, likely hemorrhagic mass with  60mm midline shift and vasogenic edema  P -d/w NSGY -MRI with/without con  -Decadron  -Pending NSGY eval, family requesting possible transfer to [facility]   Seizure, likely secondary to L frontal hemorrhage  -multiple seizures in [facility] ED requiring BZD P -seizure precautions -BIG Keppra  -Prop for sedation  -PRN BZDs -EEG after MRI -consider neuro  consult in AM   Acute respiratory failure with inability to protect airway in setting of recurrent seizures -[facility] CXR with ETT 18 mm above carina. unclear if was repositioned prior to transfer P -ABG, CXR. Pending CXR, may need retraction of ETT x 2-3cm as per prior CXR  -VAP bundle, PAD with prop   Leukocytosis -likely reactive in setting of recurrent seizure P -trend Wbc, temp  -BCx acquired at [facility], pending   HTN -improved after sedation increased and given labetalol P -ICU monitoring   Inadequate  PO intake P -pending repeat imaging, may need advancement of OGT x 8 cm  -EN per RDN   Best practice:  Diet: NPO Pain/Anxiety/Delirium protocol (if indicated): Prop  VAP protocol (if indicated): yes DVT prophylaxis: SCD GI prophylaxis: protonix  Glucose control: Monitor  Mobility: BR  Code Status: Full  Family Communication: Discussed with son at bedside 06/27/19 Disposition: ICU  Labs   CBC: Recent Labs  Lab 06/26/19 2002  WBC 17.9*  NEUTROABS 15.2*  HGB 13.1  HCT 38.7  MCV 91.7  PLT 591    Basic Metabolic Panel: Recent Labs  Lab 06/26/19 2002  NA 138  K 3.5  CL 103  CO2 26  GLUCOSE 114*  BUN 19  CREATININE 0.67  CALCIUM 9.3   GFR: Estimated Creatinine Clearance: 45.7 mL/min (by C-G formula based on SCr of 0.67 mg/dL). Recent Labs  Lab 06/26/19 2002  WBC 17.9*    Liver Function Tests: Recent Labs  Lab 06/26/19 2002  AST 25  ALT 17  ALKPHOS 82  BILITOT 1.2  PROT 7.9  ALBUMIN 4.8   No results for input(s): LIPASE, AMYLASE in the last 168 hours. No results for input(s): AMMONIA in the last 168 hours.  ABG    Component Value Date/Time   HCO3 21.3 06/27/2019 0041   ACIDBASEDEF 1.5 06/27/2019 0041   O2SAT 99.3 06/27/2019 0041     Coagulation Profile: Recent Labs  Lab 06/26/19 2002  INR 0.9    Cardiac Enzymes: No results for input(s): CKTOTAL, CKMB, CKMBINDEX, TROPONINI in the last 168 hours.  HbA1C: No results found for: HGBA1C  CBG: No results for input(s): GLUCAP in the last 168 hours.  Review of Systems:   Unable to obtain, intubated and sedated   Past Medical History  She,  has a past medical history of Anxiety and Hypertension.   Surgical History   No past surgical history on file.   Social History   reports that she has never smoked. She has never used smokeless tobacco. She reports that she does not drink alcohol or use drugs.   Family History   Her family history is not on file.   Allergies No Known Allergies    Home Medications  Prior to Admission medications   Not on File     Critical care time: 60 min     CRITICAL CARE Performed by: Cristal Generous   Total critical care time: 60 minutes  Critical care time was exclusive of separately billable procedures and treating other patients. Critical care was necessary to treat or prevent imminent or life-threatening deterioration.  Critical care was time spent personally by me on the following activities: development of treatment plan with patient and/or surrogate as well as nursing, discussions with consultants, evaluation of patient's response to treatment, examination of patient, obtaining history from patient or surrogate, ordering and performing treatments and interventions, ordering and review of laboratory studies, ordering and review of radiographic studies, pulse oximetry and  re-evaluation of patient's condition.  Eliseo Gum MSN, AGACNP-BC [place] [phone] If no answer, [phone] 06/27/2019, 4:10 AM

## 2019-06-27 NOTE — Progress Notes (Signed)
Radiology called to report CXR results: NG tube in place. RLL pulmonary nodule larger. Recommend Chest CT Informed: Sara Nine DO No new orders at this time.

## 2019-06-27 NOTE — Progress Notes (Signed)
EEG completed, results pending. 

## 2019-06-27 NOTE — ED Notes (Signed)
Report given to Tillie Rung, RN at neuro ICU [place].

## 2019-06-27 NOTE — Progress Notes (Signed)
NAMEModesta Schroeder, MRN:  [id number], DOB:  May 14, 1950, LOS: 0 ADMISSION DATE:  06/27/2019, CONSULTATION DATE:  06/26/19 REFERRING MD:  [facility], CHIEF COMPLAINT:  Seizure  Brief History   Sara Schroeder is a 69 yo F with h/o HTN, anxiety, and MDD who presented with seizure and was found to have a large hemorrhagic L frontal lobe mass. Briefly, she had new onset seizure at [facility] on 5/3 characterized by R-sided convulsions and twitching. EMS arrived and she had another seizure in transit. She was given Ativan and Keppra at [facility] ED. Vitals on presentation significant for elevated BP of 170s/90s, HR 100s, T37. Labs were unremarkable except WBC of 17.2. CT head showed 2.7x2.0x2.8cm hemorrhagic mass in the L frontal lobe. Pt was intubated for airway protection and transferred to [facility] ICU. Pt's son and granddaughter deny history of seizures.   Past Medical History  HTN Depression Anxiety  Significant Hospital Events   5/3: Multiple seizures, intubated for airway protection.  5/3: L frontal hemorrhagic mass on CT head  5/4: Transferred to [facility] Neuro ICU  Consults:  Neurosurgery (5/4)  Procedures:  5/3: ETT   Significant Diagnostic Tests:  5/3 CT Head noncon> L frontal lobe hemorrhage 2.7x2.0 x 2.8 cm with surrounding edema. 15mm rightward midline shift. No herniation.  5/3 CXR> ETT 44mm above carina. Enteric tube above JE junction. Recommend retraction of ETT 2-3 cm, advancement of Enteric tube 8 cm. 5/4 CXR: ETT 2-3 cm above carine. OG tube in stomach.  5/4 MRI brain: 1. 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 There was a worrisome pulmonary nodule on a January 2020 chest CT, updated chest CT may be contributory. 2. Extensive vasogenic edema.  Micro Data:  5/3: SARS Cov2/Influenza A/Influenza B: Negative  Antimicrobials:  None    Interim history/subjective:  5/4: NAE. Pt received 1x Labetalol prn and VSS since, BP 110s/60s, HR 80s. SpO2 100 on PRVC  FiO2 40%, PEEP 5, Plat 13. ABG at 0400 was 7.4/34.7/200/21. Morning labs significant for K 4.0 from 3.2, TSH 0.124. 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 Pt did have concerning lung nodule in January 2020 that has not been followed. Son at the bedside, says they would like to undergo surgery here if it is "simple enough".   Objective   Blood pressure 111/63, pulse 80, temperature (!) 96.8 F (36 C), resp. rate 20, height 5\' 2"  (1.575 m), weight 57.8 kg, SpO2 100 %.    Vent Mode: PRVC FiO2 (%):  [40 %-75 %] 40 % Set Rate:  [18 bmp-30 bmp] 20 bmp Vt Set:  [400 mL] 400 mL PEEP:  [5 cmH20] 5 cmH20 Plateau Pressure:  [13 cmH20-14 cmH20] 14 cmH20   Intake/Output Summary (Last 24 hours) at 06/27/2019 0954 Last data filed at 06/27/2019 0900 Gross per 24 hour  Intake 299.79 ml  Output 100 ml  Net 199.79 ml   Filed Weights   06/27/19 0315  Weight: 57.8 kg    Examination: Gen: Intubated. Appears comfortable, deeply sedated HEENT: atraumatic, normocephalic, pupils equal, responsive to light Neck: no cervical lymphadenopathy, no JVD, no thyroid nodules/thyromegaly Heart: RRR, S1, S2, no M/R/G, no chest wall tenderness Lungs: CTAB, no crackles or wheezes Abdomen: Normoactive bowel sounds, soft nondistended Extremities: no clubbing, cyanosis, or edema: pulses are +2 Neuro: No focal deficits. Skin:  No rashes, lesions   Resolved Hospital Problem list   none  Assessment &  Plan:  Sara Schroeder is a 69 yo F with h/o HTN, MDD who presented to [facility] with new, acute onset seizure found to have hemorrhagic L frontal lobe mass on head CT.   Intracranial Hemorrhage of L Frontal lobe  L frontal lobe hemorrhagic mass: Pt presented with seizure and found to have 2.7x2.0x2.8 cm hemorrhagic mass in L frontal lobe with surrounding edema and 2.38mm R midline shift. F/u brain MRI showed Solitary 2.6 cm left lateral frontal mass compatible with  hemorrhagic neoplasm. VSS and no signs of overt elevated ICP. Neurosurgery consulted and following. Pt's son had GBM operated on at [place] 8 months ago and has since undergone extensive chemo and radiation. Pt's family initially inclined to attempt to go to [facility] for surgery but now intend to do it at [facility]. Notably, pt did have worrisome pulmonary nodule on Chest CT in Jan 2020 which may be contributory.  - Neurosurgery consulted. Appreciate recs  - Decadron ongoing -will order ct chest/abd/pelvis  Seizures: 2/2 L frontal hemorrhage. Pt presented with multiple seizures and was unable to protect her airway. She received Ativan acutely as well as Keppra. Currently on Keppra and sedation with propofol, and has not had recurrence of seizures. We will get an EEG today to further evaluate.  - Seizure precautions  - Keppra  - Sedation with propofol  - EEG without seizure captured  Acute Respiratory Failure: Pt was unable to protect airway 2/2 multiple seizures and was intubated on 5/3. Most recent ABG 7.4/34.7/200/21, which is reassuring. CXR confirmed ETT placement 3 cm above carina. Will reassess tomorrow am Pt SpO2 100 on PRVC FiO2 40% PEEP 5 Plat 13.  - Continue vent support  - VAP per protocol  -cxr in am  Pulmonary Nodule: Pt had concerning pulmonary nodule on Chest CT in Jan 2020, which has not been followed. Although more likely brain neoplasm, may be contributory to current presentation of hemorrhagic mass in brain. Neurosurgery will evaluate with CT scans as appropriate.  - Repeat CT per neurosurgery -chest ct pending  Low TSH: Pt's son emphasized concern regarding pt's thyroid history. Though it is unclear, he mentioned that she may have a history of thyroid nodules and disease. TSH low at 0.124 today. Son denies sx of hyperthyroid in recent history however he is unsure. This presentation is not consistent with thyroid storm as pt has been normal from a thermoregulatory and vitals standpoint. We  will continue to monitor for signs/sx of thyroid disease.  - will check free t4/t3 to eval -son does endorse pt was on "pills for thyroid back home" but has not been on anything since she came to the [place].   FEN/GI: Pt not currently on diet. We will consult nutrition today to begin tube feeds. K 3.2, repleted this am and most recently 4.0.  - Consult to dietitian to begin tube feeds  - Replete K with 40 mEq KCl per tube once  - Check Mg, Phosphorus level - Daily BMP   Best practice:  Diet: NPO; tube feeds to start today Pain/Anxiety/Delirium protocol (if indicated): Fentanyl/Propofol  VAP protocol (if indicated): per protocol  DVT prophylaxis: SCDs GI prophylaxis: Protonix  Glucose control: none indicated  Mobility: Intubated, sedated, bedrest Code Status: FULL Family Communication: 5/4 AM: Son at bedside.  Disposition: ICU   Labs   CBC: Recent Labs  Lab 06/26/19 2002 06/27/19 0401  WBC 17.9*  --   NEUTROABS 15.2*  --   HGB 13.1 11.9*  HCT 38.7 35.0*  MCV 91.7  --   PLT 230  --     Basic Metabolic Panel: Recent Labs  Lab 06/26/19 2002 06/27/19 0401  NA 138 138  K 3.5 3.2*  CL 103  --   CO2 26  --   GLUCOSE 114*  --   BUN 19  --   CREATININE 0.67  --   CALCIUM 9.3  --    GFR: Estimated Creatinine Clearance: 53.2 mL/min (by C-G formula based on SCr of 0.67 mg/dL). Recent Labs  Lab 06/26/19 2002  WBC 17.9*    Liver Function Tests: Recent Labs  Lab 06/26/19 2002  AST 25  ALT 17  ALKPHOS 82  BILITOT 1.2  PROT 7.9  ALBUMIN 4.8   No results for input(s): LIPASE, AMYLASE in the last 168 hours. No results for input(s): AMMONIA in the last 168 hours.  ABG    Component Value Date/Time   PHART 7.391 06/27/2019 0401   PCO2ART 34.7 06/27/2019 0401   PO2ART 200 (H) 06/27/2019 0401   HCO3 21.0 06/27/2019 0401   TCO2 22 06/27/2019 0401   ACIDBASEDEF 3.0 (H) 06/27/2019 0401   O2SAT 100.0 06/27/2019 0401     Coagulation Profile: Recent Labs  Lab  06/26/19 2002  INR 0.9    Cardiac Enzymes: No results for input(s): CKTOTAL, CKMB, CKMBINDEX, TROPONINI in the last 168 hours.  HbA1C: No results found for: HGBA1C  CBG: No results for input(s): GLUCAP in the last 168 hours.    Critical care time: The patient is critically ill with multiple organ systems failure and requires high complexity decision making for assessment and support, frequent evaluation and titration of therapies, application of advanced monitoring technologies and extensive interpretation of multiple databases.  Additional Critical care time 32 mins. This represents my time independent of the medical students/residents/NPs time taking care of the pt. This is excluding procedures.    Audria Nine DO [facility] 06/27/2019, 11:56 AM

## 2019-06-27 NOTE — Procedures (Signed)
Patient Name: Sara Schroeder  MRN: [id number]  Epilepsy Attending: Lora Havens  Referring Physician/Provider: Eliseo Gum, NP Date: 06/27/2019 Duration: 28.21 minutes  Patient history: 69 year old female presented with seizure-like episode and was found to have left frontal lobe hemorrhagic mass.  EEG evaluate for seizures.  Level of alertness: Comatose  AEDs during EEG study: Keppra, propofol  Technical aspects: This EEG study was done with scalp electrodes positioned according to the 10-20 International system of electrode placement. Electrical activity was acquired at a sampling rate of 500Hz  and reviewed with a high frequency filter of 70Hz  and a low frequency filter of 1Hz . EEG data were recorded continuously and digitally stored.   Description: EEG showed an excessive amount of 15 to 18 Hz, sharply contoured beta activity with irregular morphology distributed symmetrically and diffusely.   Intermittent generalized background attenuation was also noted. Hyperventilation and photic stimulation were not performed.  Abnormality -Excessive beta, generalized -Background attenuation, generalized  IMPRESSION: This study is suggestive of severe to profound diffuse encephalopathy, nonspecific etiology but most likely secondary to sedation. No seizures or definite epileptiform discharges were seen throughout the recording.    [name] [name] [name] [name]

## 2019-06-27 NOTE — ED Provider Notes (Signed)
I assumed care of the patient from Dr. Cherylann Banas at 11:00 PM.  I was advised that if [facility] did not have an available bed at midnight to transfer the patient to [facility], ICU as a conversation was already had with Dr. Oletta Darter.  I was notified I do place the patient on a waiting list as they did not have any available beds and as such I subsequently spoke with Dr. Oletta Darter at [facility] who accepted the patient on behalf of Dr. Doyne Keel.  In addition I spoke with the neurosurgeon Dr. Maudie Mercury at [facility] and made her aware of the patient being transferred there.   Gregor Hams, MD 06/27/19 0111

## 2019-06-27 NOTE — Progress Notes (Signed)
RT transported pt from 4N23 to CT, MRI and back with RN. No complications. RT will continue to monitor.

## 2019-06-28 ENCOUNTER — Inpatient Hospital Stay (HOSPITAL_COMMUNITY): Payer: Medicare Other

## 2019-06-28 DIAGNOSIS — D496 Neoplasm of unspecified behavior of brain: Secondary | ICD-10-CM

## 2019-06-28 DIAGNOSIS — J9600 Acute respiratory failure, unspecified whether with hypoxia or hypercapnia: Secondary | ICD-10-CM | POA: Diagnosis not present

## 2019-06-28 DIAGNOSIS — C7931 Secondary malignant neoplasm of brain: Secondary | ICD-10-CM | POA: Diagnosis not present

## 2019-06-28 DIAGNOSIS — G40901 Epilepsy, unspecified, not intractable, with status epilepticus: Secondary | ICD-10-CM | POA: Diagnosis not present

## 2019-06-28 LAB — MAGNESIUM
Magnesium: 2.2 mg/dL (ref 1.7–2.4)
Magnesium: 2.4 mg/dL (ref 1.7–2.4)

## 2019-06-28 LAB — CBC
HCT: 35 % — ABNORMAL LOW (ref 36.0–46.0)
Hemoglobin: 11.5 g/dL — ABNORMAL LOW (ref 12.0–15.0)
MCH: 31.3 pg (ref 26.0–34.0)
MCHC: 32.9 g/dL (ref 30.0–36.0)
MCV: 95.4 fL (ref 80.0–100.0)
Platelets: 203 10*3/uL (ref 150–400)
RBC: 3.67 MIL/uL — ABNORMAL LOW (ref 3.87–5.11)
RDW: 13.8 % (ref 11.5–15.5)
WBC: 22.9 10*3/uL — ABNORMAL HIGH (ref 4.0–10.5)
nRBC: 0 % (ref 0.0–0.2)

## 2019-06-28 LAB — PHOSPHORUS
Phosphorus: 2.5 mg/dL (ref 2.5–4.6)
Phosphorus: 2.2 mg/dL — ABNORMAL LOW (ref 2.5–4.6)

## 2019-06-28 LAB — GLUCOSE, CAPILLARY
Glucose-Capillary: 137 mg/dL — ABNORMAL HIGH (ref 70–99)
Glucose-Capillary: 148 mg/dL — ABNORMAL HIGH (ref 70–99)
Glucose-Capillary: 157 mg/dL — ABNORMAL HIGH (ref 70–99)
Glucose-Capillary: 161 mg/dL — ABNORMAL HIGH (ref 70–99)
Glucose-Capillary: 166 mg/dL — ABNORMAL HIGH (ref 70–99)
Glucose-Capillary: 171 mg/dL — ABNORMAL HIGH (ref 70–99)

## 2019-06-28 LAB — BASIC METABOLIC PANEL
Anion gap: 8 (ref 5–15)
BUN: 25 mg/dL — ABNORMAL HIGH (ref 8–23)
CO2: 21 mmol/L — ABNORMAL LOW (ref 22–32)
Calcium: 9.3 mg/dL (ref 8.9–10.3)
Chloride: 108 mmol/L (ref 98–111)
Creatinine, Ser: 0.67 mg/dL (ref 0.44–1.00)
GFR calc Af Amer: >60 mL/min (ref >60)
GFR calc non Af Amer: >60 mL/min (ref >60)
Glucose, Bld: 183 mg/dL — ABNORMAL HIGH (ref 70–99)
Potassium: 4.2 mmol/L (ref 3.5–5.1)
Sodium: 137 mmol/L (ref 135–145)

## 2019-06-28 LAB — TRIGLYCERIDES: Triglycerides: 71 mg/dL (ref <150)

## 2019-06-28 LAB — T3, FREE: T3, Free: 1.9 pg/mL — ABNORMAL LOW (ref 2.0–4.4)

## 2019-06-28 IMAGING — DX DG CHEST 1V PORT
1 series · 1 of 1 positions shown · non-contrast
Comparison: CT [DATE]

CLINICAL DATA: Acute hypoxemic respiratory failure

EXAM:
PORTABLE CHEST 1 VIEW

[chest ap]
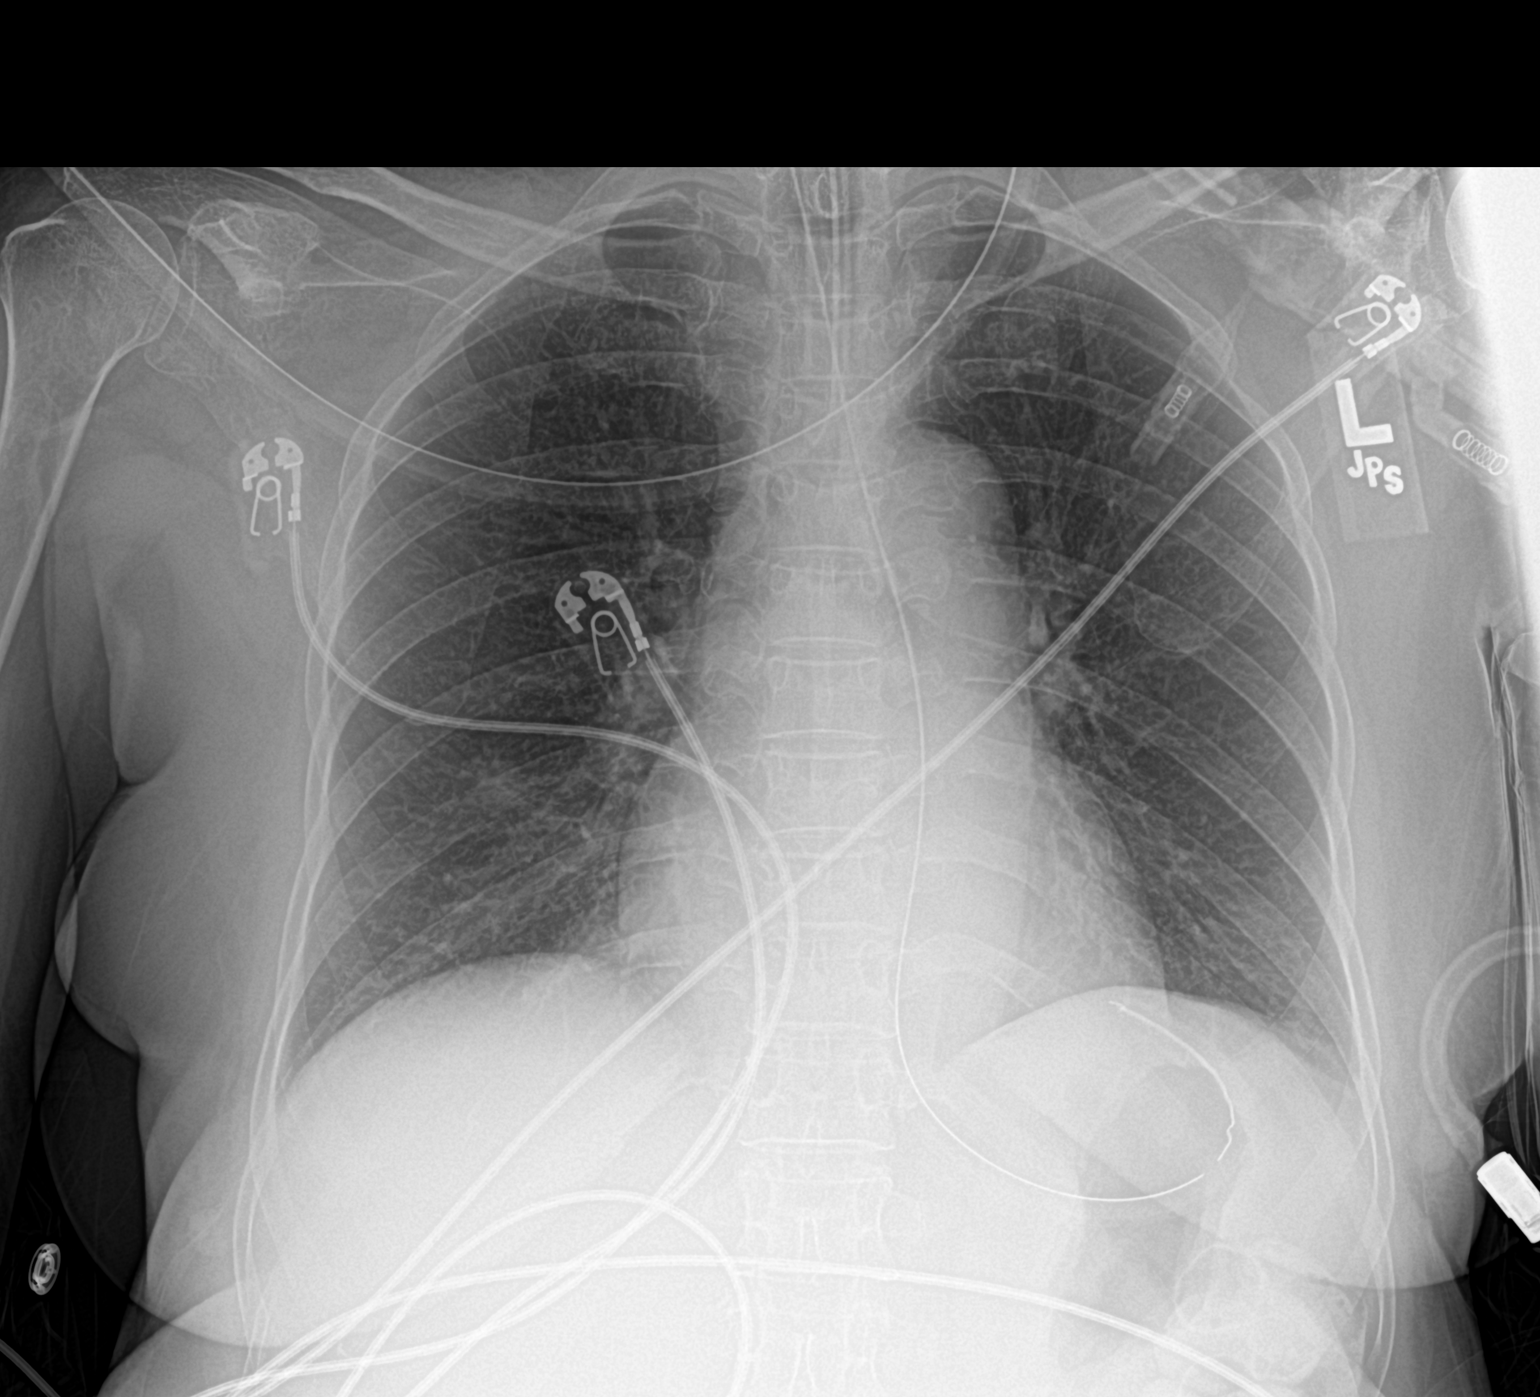

[1 of 1 positions shown; findings below may reference images not displayed]

FINDINGS: *Endotracheal tube in the mid trachea 3 cm from the carina.
*Transesophageal tube tip and side port distal to the GE junction
curling in the left upper quadrant.
*Telemetry leads and support devices overlie the chest.

Redemonstration of a right middle lobe pulmonary nodule better seen
on comparison CT. Mild basilar atelectatic changes. No acute
consolidative opacity or convincing features of edema. No
pneumothorax or effusion. Cardiomediastinal contours are stable. No
acute osseous or soft tissue abnormality. Degenerative changes are
present in the imaged spine and shoulders.
IMPRESSION: 1. Mild basilar atelectatic changes.
2. Redemonstration of right middle lobe pulmonary nodule better seen
on comparison CT.
3. Lines and tubes as above.

## 2019-06-28 IMAGING — US US THYROID
1 series · 13 of 25 positions shown · non-contrast
Comparison: CT of the chest on [DATE]

CLINICAL DATA: Incidental on CT. Nodule of thyroid isthmus by CT of
the chest.

EXAM:
THYROID ULTRASOUND
TECHNIQUE: Ultrasound examination of the thyroid gland and adjacent soft
tissues was performed.

[Series 1: us thyroid · 13 of 46 slices shown]
[im 1/46]
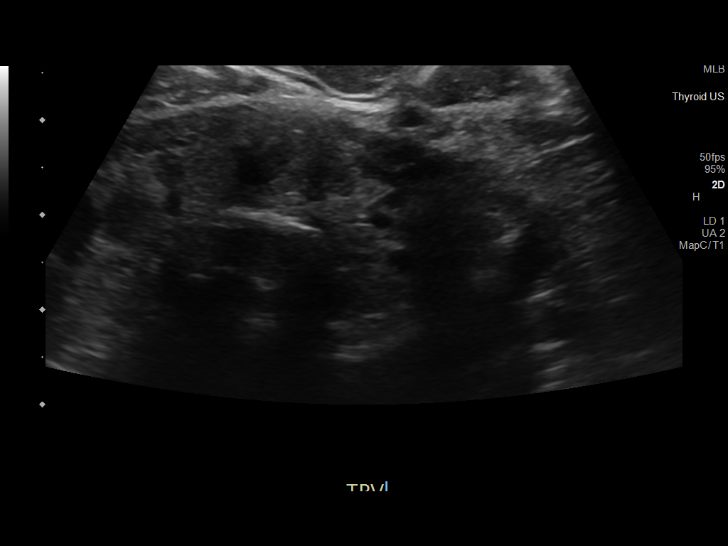
[im 4/46]
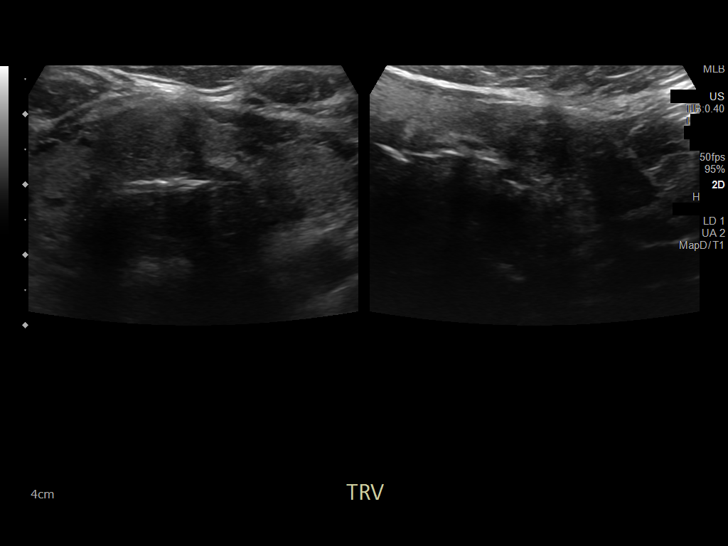
[im 8/46]
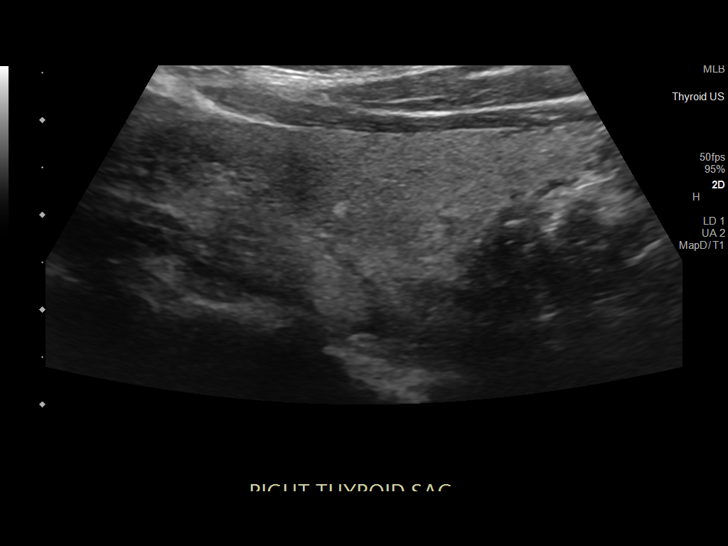
[im 12/46]
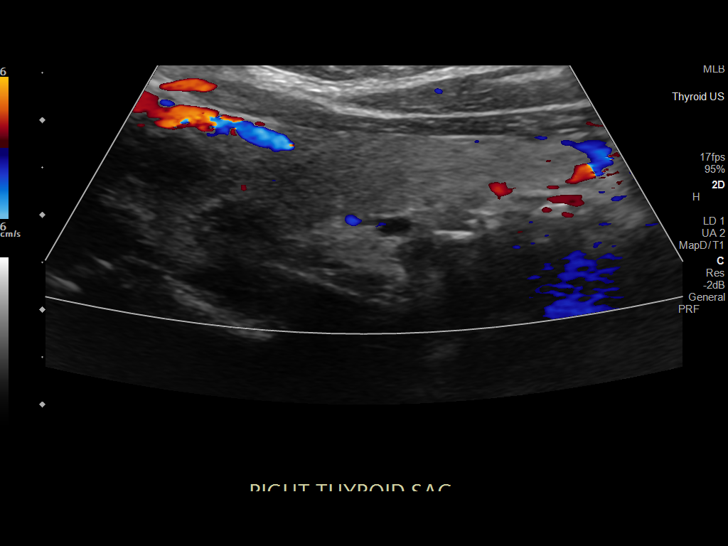
[im 16/46]
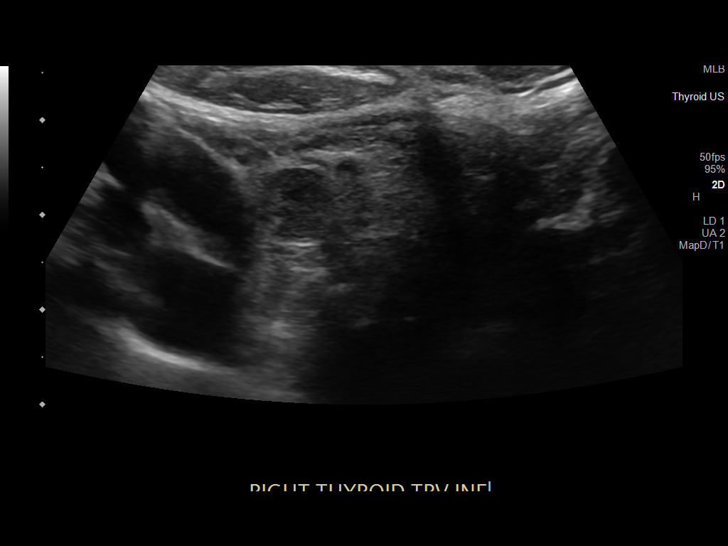
[im 19/46]
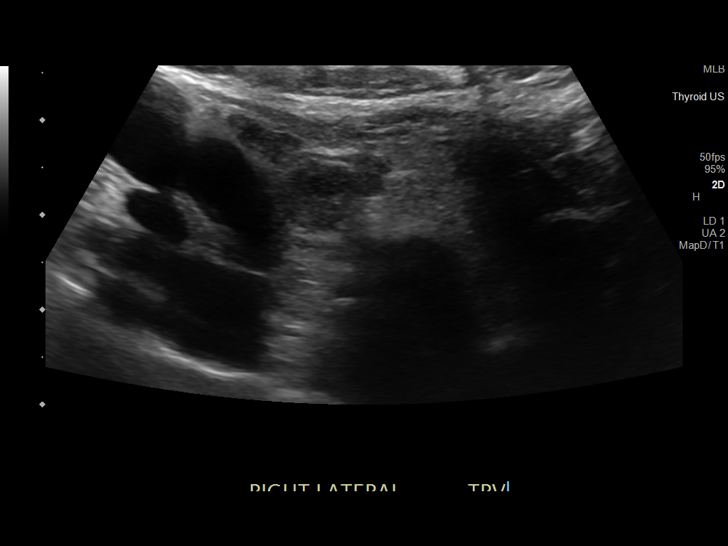
[im 23/46]
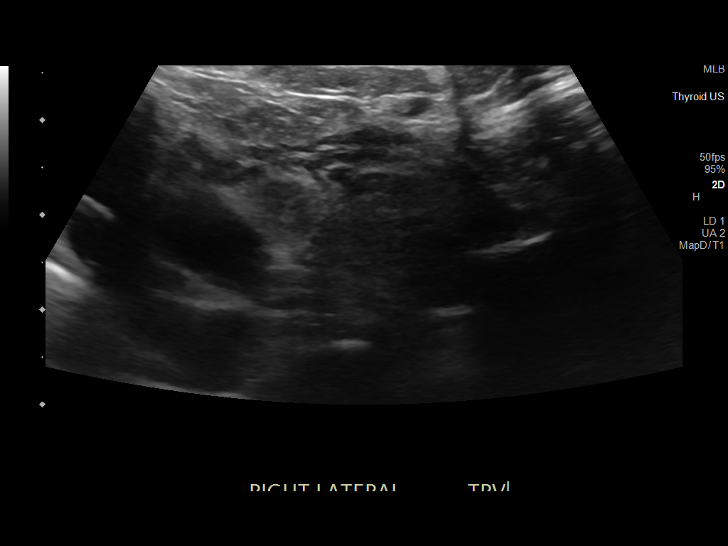
[im 27/46]
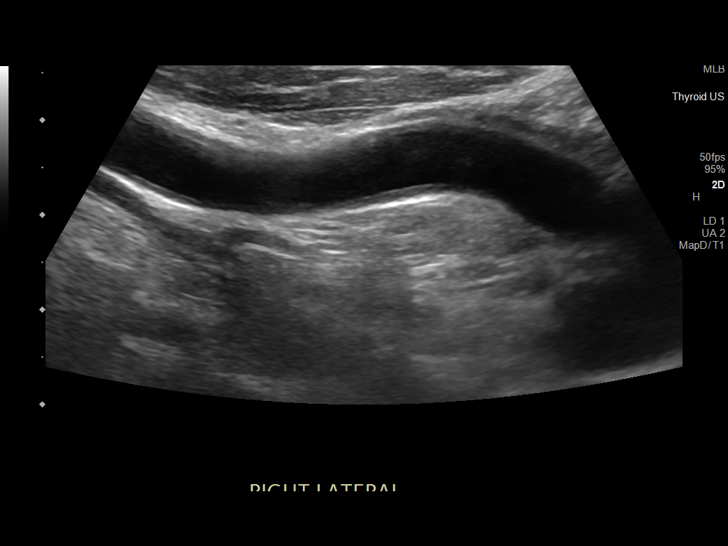
[im 31/46]
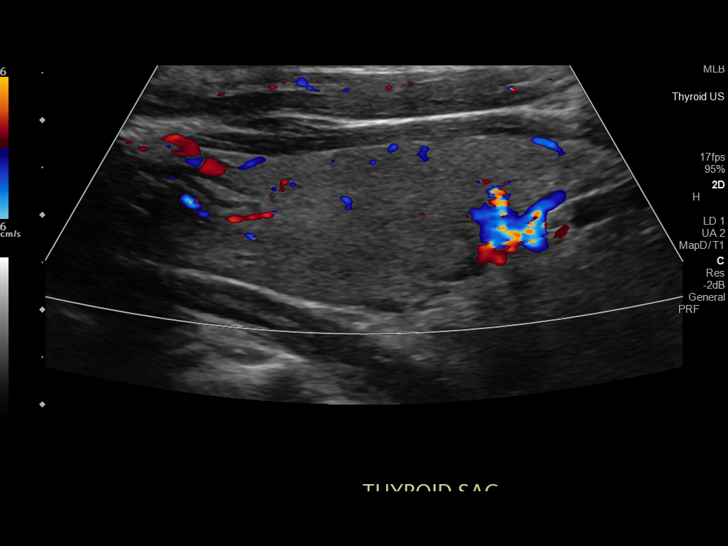
[im 34/46]
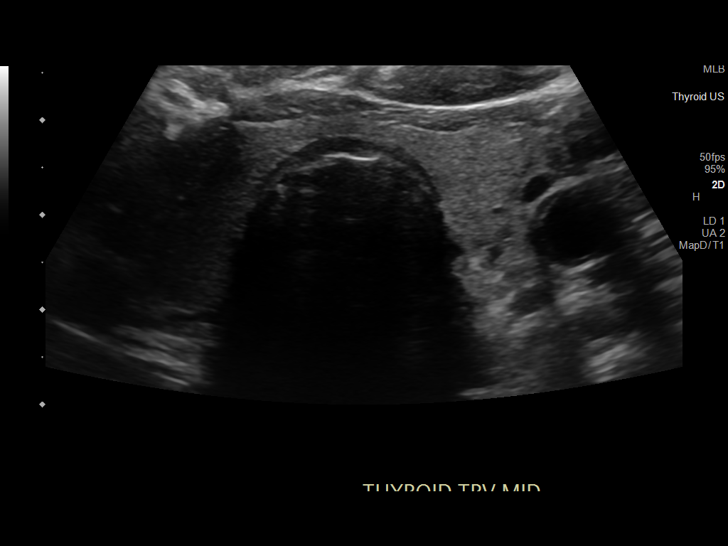
[im 38/46]
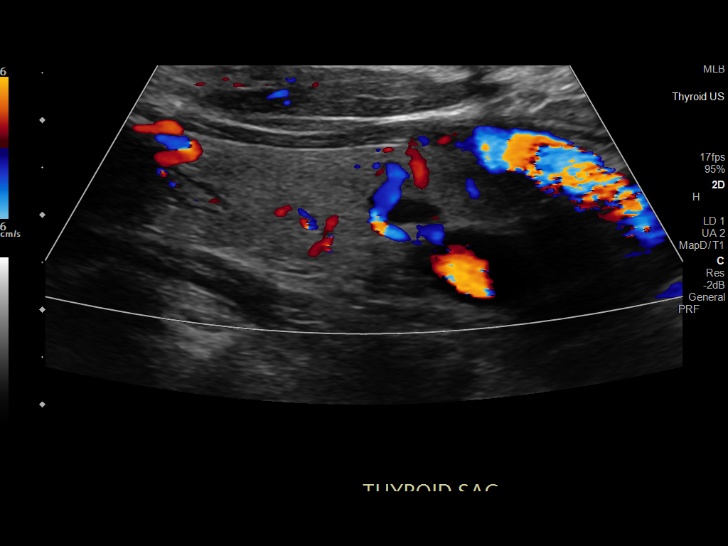
[im 42/46]
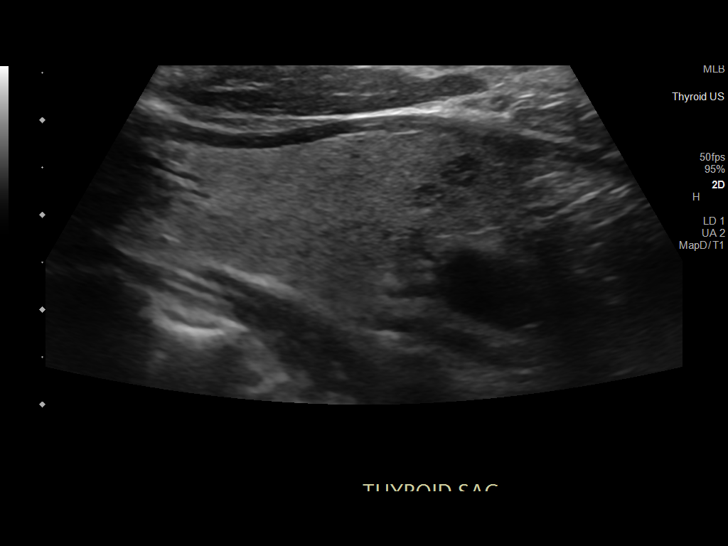
[im 46/46]
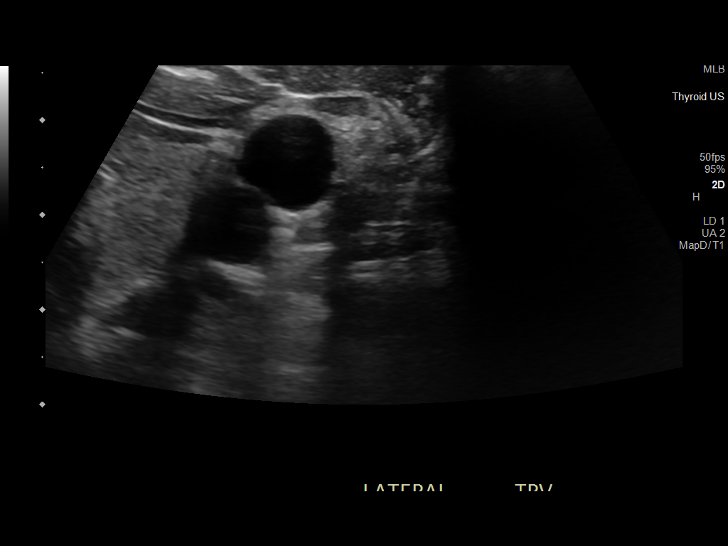

[13 of 25 positions shown; findings below may reference images not displayed]

FINDINGS: Parenchymal Echotexture: Mildly heterogenous

Isthmus: 1.0 cm

Right lobe: 4.4 x 1.7 x 1.2 cm

Left lobe: 4.3 x 1.9 x 1.3 cm

_________________________________________________________

Estimated total number of nodules >/= 1 cm: 1

Number of spongiform nodules >/=  2 cm not described below (TR1): 0

Number of mixed cystic and solid nodules >/= 1.5 cm not described
below (TR2): 0

_________________________________________________________

Nodule # 1:

Location: Isthmus

Maximum size: 1.6 cm; Other 2 dimensions: 1.4 x 1.1 cm

Composition: solid/almost completely solid (2)

Echogenicity: hypoechoic (2)

Shape: not taller-than-wide (0)

Margins: ill-defined (0)

Echogenic foci: none (0)

ACR TI-RADS total points: 4.

ACR TI-RADS risk category: TR4 (4-6 points).

ACR TI-RADS recommendations:

**Given size (>/= 1.5 cm) and appearance, fine needle aspiration of
this moderately suspicious nodule should be considered based on
TI-RADS criteria.

_________________________________________________________

Simple cyst in the inferior left lobe measures 0.9 cm in greatest
diameter and has a benign appearance. No abnormal lymph nodes
identified
IMPRESSION: 1.6 cm isthmus nodule technically meets criteria for fine-needle
aspiration. The nodule is borderline in size for biopsy and 1 year
follow-up ultrasound could also be considered.

The above is in keeping with the ACR TI-RADS recommendations - [HOSPITAL] [KH];[DATE].

## 2019-06-28 MED ORDER — CHLORHEXIDINE GLUCONATE CLOTH 2 % EX PADS
6.0000 | MEDICATED_PAD | Freq: Every day | CUTANEOUS | Status: DC
  Administered 2019-06-29 (×2): 6 via TOPICAL

## 2019-06-28 MED ORDER — ACETAMINOPHEN 325 MG PO TABS: 650.0000 mg | ORAL_TABLET | ORAL | Status: DC | PRN

## 2019-06-28 MED ORDER — PROPOFOL 1000 MG/100ML IV EMUL
0.0000 ug/kg/min | INTRAVENOUS | Status: DC
  Administered 2019-06-28 – 2019-06-29 (×2): 40 ug/kg/min via INTRAVENOUS
  Administered 2019-06-29: 45 ug/kg/min via INTRAVENOUS
  Administered 2019-06-30: 40 ug/kg/min via INTRAVENOUS
  Filled 2019-06-28 (×6): qty 100

## 2019-06-28 NOTE — Progress Notes (Signed)
Neurosurgery Service Progress Note  Subjective: Assuming care from Dr. Ronnald Ramp, no issues overnight   Objective: Vitals:   06/28/19 0446 06/28/19 0500 06/28/19 0600 06/28/19 0700  BP:  (!) 101/55 108/64 (!) 99/52  Pulse:  67 69 70  Resp:  20 20 20   Temp:  (!) 96.8 F (36 C) (!) 96.8 F (36 C) (!) 97.2 F (36.2 C)  TempSrc:      SpO2:  100% 100% 100%  Weight: 57.6 kg     Height:       Temp (24hrs), Avg:96.9 F (36.1 C), Min:96.4 F (35.8 C), Max:98.1 F (36.7 C)  CBC Latest Ref Rng & Units 06/27/2019 06/26/2019 03/01/2018  WBC 4.0 - 10.5 K/uL - 17.9(H) 10.8(H)  Hemoglobin 12.0 - 15.0 g/dL 11.9(L) 13.1 13.2  Hematocrit 36.0 - 46.0 % 35.0(L) 38.7 40.4  Platelets 150 - 400 K/uL - 230 258   BMP Latest Ref Rng & Units 06/27/2019 06/27/2019 06/26/2019  Glucose 70 - 99 mg/dL 147(H) - 114(H)  BUN 8 - 23 mg/dL 15 - 19  Creatinine 0.44 - 1.00 mg/dL 0.67 - 0.67  Sodium 135 - 145 mmol/L 139 138 138  Potassium 3.5 - 5.1 mmol/L 4.0 3.2(L) 3.5  Chloride 98 - 111 mmol/L 109 - 103  CO2 22 - 32 mmol/L 19(L) - 26  Calcium 8.9 - 10.3 mg/dL 8.9 - 9.3    Intake/Output Summary (Last 24 hours) at 06/28/2019 0818 Last data filed at 06/28/2019 0700 Gross per 24 hour  Intake 2391.31 ml  Output 2500 ml  Net -108.69 ml    Current Facility-Administered Medications:  .  acetaminophen (TYLENOL) tablet 650 mg, 650 mg, Per Tube, Q4H PRN, Audria Nine, DO .  chlorhexidine gluconate (MEDLINE KIT) (PERIDEX) 0.12 % solution 15 mL, 15 mL, Mouth Rinse, BID, Anders Simmonds, MD, 15 mL at 06/28/19 0724 .  Chlorhexidine Gluconate Cloth 2 % PADS 6 each, 6 each, Topical, Daily, Anders Simmonds, MD, 6 each at 06/27/19 2100 .  dexamethasone (DECADRON) injection 4 mg, 4 mg, Intravenous, Q6H, Eustace Moore, MD, 4 mg at 06/28/19 0559 .  docusate (COLACE) 50 MG/5ML liquid 100 mg, 100 mg, Per Tube, BID, Bowser, Laurel Dimmer, NP, 100 mg at 06/27/19 0943 .  feeding supplement (PRO-STAT SUGAR FREE 64) liquid 30 mL, 30 mL, Per  Tube, Daily, Audria Nine, DO .  feeding supplement (VITAL AF 1.2 CAL) liquid 1,000 mL, 1,000 mL, Per Tube, Continuous, Audria Nine, DO, Last Rate: 35 mL/hr at 06/28/19 0700, Rate Verify at 06/28/19 0700 .  fentaNYL (SUBLIMAZE) injection 25 mcg, 25 mcg, Intravenous, Q15 min PRN, Bowser, Grace E, NP .  fentaNYL (SUBLIMAZE) injection 25-100 mcg, 25-100 mcg, Intravenous, Q30 min PRN, Bowser, Laurel Dimmer, NP, 100 mcg at 06/28/19 0310 .  lactated ringers infusion, , Intravenous, Continuous, Bowser, Grace E, NP, Last Rate: 50 mL/hr at 06/28/19 0700, Rate Verify at 06/28/19 0700 .  levETIRAcetam (KEPPRA) IVPB 500 mg/100 mL premix, 500 mg, Intravenous, Q12H, Bowser, Grace E, NP, Last Rate: 400 mL/hr at 06/28/19 0732, 500 mg at 06/28/19 0732 .  LORazepam (ATIVAN) injection 2 mg, 2 mg, Intravenous, Q5 min PRN, Bowser, Laurel Dimmer, NP, 2 mg at 06/27/19 7902 .  MEDLINE mouth rinse, 15 mL, Mouth Rinse, 10 times per day, Anders Simmonds, MD, 15 mL at 06/28/19 0559 .  ondansetron (ZOFRAN) injection 4 mg, 4 mg, Intravenous, Q6H PRN, Bowser, Grace E, NP .  pantoprazole (PROTONIX) injection 40 mg, 40 mg, Intravenous, QHS, Bowser, Shirlee Limerick  E, NP, 40 mg at 06/27/19 2150 .  polyethylene glycol (MIRALAX / GLYCOLAX) packet 17 g, 17 g, Per Tube, Daily PRN, Audria Nine, DO .  propofol (DIPRIVAN) 1000 MG/100ML infusion, 0-50 mcg/kg/min, Intravenous, Continuous, Bowser, Laurel Dimmer, NP, Last Rate: 12.9 mL/hr at 06/28/19 0749, 50 mcg/kg/min at 06/28/19 0749   Physical Exam: Intubated, sedated, eyes open to voice, trying to sit up in bed, MAEx4 equally  Assessment & Plan: 69 y.o. woman p/w seizures, MRI brain with left frontal 23x61m hemorrhagic mass, CT CAP with pulmonary nodule with some growth since 2020 CT.  -Discussed with pt's son, recommend craniotomy for resection, OR scheduled for tomorrow afternoon, NPO p MN, can keep intubated since [place] 06/28/19 8:18 AM

## 2019-06-28 NOTE — Progress Notes (Signed)
69 year old [place] woman, admitted with seizure, found to have hemorrhagic left frontal lobe mass 2.8 cm  CT chest/abdomen/pelvis from 5/4 personally reviewed which shows spiculated right middle lobe nodule 11 mm increased from 10 mm in 02/2019 and 16 mm thyroid nodule noted prior  EEG negative  On exam-critically ill, intubated, sedated on propofol, agitation woken up per RN, bilateral ventilated breath sounds, S1-S2 regular, no JVD or edema, soft nontender abdomen.  Chest x-ray personally reviewed which shows ET tube in good position, nodule noted, no new infiltrates.  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, resection is planned 5/6 -Continue Decadron for vasogenic edema  Seizure -continue Keppra, continue propofol for sedation with goal RASS 0 to -1, intermittent fentanyl as needed  Acute respiratory failure -SBT's, will hold off extubation until after surgery  Thyroid nodule -previous thyroid scan in 2015, check ultrasound thyroid  Son updated at bedside  The patient is critically ill with multiple organ systems failure and requires high complexity decision making for assessment and support, frequent evaluation and titration of therapies, application of advanced monitoring technologies and extensive interpretation of multiple databases. Critical Care Time devoted to patient care services described in this note independent of APP/resident  time is 31 minutes.   Leanna Sato Elsworth Soho MD

## 2019-06-28 NOTE — Progress Notes (Signed)
NAMEJenica Schroeder, MRN:  [id number], DOB:  06-Jun-1950, LOS: 1 ADMISSION DATE:  06/27/2019, CONSULTATION DATE:  06/26/19 REFERRING MD:  [facility], CHIEF COMPLAINT:  Seizure  Brief History   Sara Schroeder is a 69 yo F with h/o HTN, anxiety, and MDD who presented with seizure and was found to have a large hemorrhagic L frontal lobe mass. Briefly, she had new onset seizure at [facility] on 5/3 characterized by R-sided convulsions and twitching. EMS arrived and she had another seizure in transit. She was given Ativan and Keppra at [facility] ED. Vitals on presentation significant for elevated BP of 170s/90s, HR 100s, T37. Labs were unremarkable except WBC of 17.2. CT head showed 2.7x2.0x2.8cm hemorrhagic mass in the L frontal lobe. Pt was intubated for airway protection and transferred to [facility] ICU. Pt's son and granddaughter deny history of seizures.   Past Medical History  HTN Depression Anxiety  Significant Hospital Events   5/3: Multiple seizures, intubated for airway protection.  5/3: L frontal hemorrhagic mass on CT head  5/4: Transferred to [facility] Neuro ICU  Consults:  Neurosurgery (5/4)  Procedures:  5/3: ETT   Significant Diagnostic Tests:  5/3 CT Head noncon> L frontal lobe hemorrhage 2.7x2.0 x 2.8 cm with surrounding edema. 28mm rightward midline shift. No herniation.  5/3 CXR> ETT 4mm above carina. Enteric tube above JE junction. Recommend retraction of ETT 2-3 cm, advancement of Enteric tube 8 cm. 5/4 CXR: ETT 2-3 cm above carine. OG tube in stomach.  5/4 MRI brain: Solitary hemorrhagic mass left frontal lobe measuring 2.2 x 2.3 x 2.8 cm. Extensive vasogenic edema. 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 Primary brain tumor not likely. 5/4: CT Chest, Abd, Pelvis >> 1. Spiculated right middle lobe pulmonary nodule measuring 11 x 11 x 10 mm, only minimally increased in size from prior exam. This is suspicious for primary bronchogenic malignancy. Evaluation of the hila is limited in the  absence of IV contrast, allowing for this limitation, no evidence of nodal metastasis. 2. No other evidence of metastatic disease or primary malignancy in the chest, abdomen, or pelvis. 3. Exophytic 16 mm nodule from the thyroid isthmus, patient had previous thyroid uptake and scan at an outside institution that reported this nodule, however size was not specified, and those images are not available. No thyroid ultrasounds are available for review. Therefore, recommend thyroid US (ref: J Am Coll Radiol. 2015 Feb;12(2): 143-50), unless performed elsewhere. 4. Dependent opacities in the lung bases favor atelectasis.  Micro Data:  5/3: SARS Cov2/Influenza A/Influenza B: Negative  Antimicrobials:  None    Interim history/subjective:  5/5: NAE. Pt intubated, sedated, and comfortable on below settings. Began tube feeds and tolerating well. VSS. TSH low at 0.124 but free T4 wnl. WBC 22.9 from 17, K 4.2 Mg 2.2. CT chest, abd, pelvis showed spiculated pulmonary nodule in R middle lobe c/f primary bronchogenic malignancy. Also showed exophytic 16 mm thyroid nodule. Brain MRI showed solitary hemorrhagic mass favoring solitary metastasis.   5/4: NAE. Pt received 1x Labetalol prn and VSS since, BP 110s/60s, HR 80s. SpO2 100 on PRVC FiO2 40%, PEEP 5, Plat 13. ABG at 0400 was 7.4/34.7/200/21. Morning labs significant for K 4.0 from 3.2, TSH 0.124. 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 Pt did have concerning lung nodule in January 2020 that has not been followed. Son at the bedside, says they would like to undergo surgery here if  it is "simple enough".   Objective   Blood pressure 123/82, pulse 87, temperature (!) 97.2 F (36.2 C), resp. rate 20, height 5\' 2"  (1.575 m), weight 57.6 kg, SpO2 100 %.    Vent Mode: PSV;CPAP FiO2 (%):  [30 %-40 %] 30 % Set Rate:  [20 bmp] 20 bmp Vt Set:  [400 mL] 400 mL PEEP:  [5 cmH20] 5 cmH20 Pressure  Support:  [5 cmH20] 5 cmH20 Plateau Pressure:  [13 cmH20-16 cmH20] 13 cmH20   Intake/Output Summary (Last 24 hours) at 06/28/2019 0928 Last data filed at 06/28/2019 0700 Gross per 24 hour  Intake 2273.34 ml  Output 2500 ml  Net -226.66 ml   Filed Weights   06/27/19 0315 06/28/19 0446  Weight: 57.8 kg 57.6 kg    Examination: Gen: Intubated. Appears comfortable, sedated but arouses to voice.  HEENT: atraumatic, normocephalic, pupils equal, responsive to light Neck: no cervical lymphadenopathy, no JVD, no thyroid nodules/thyromegaly Heart: RRR, S1, S2, no M/R/G, no chest wall tenderness Lungs: CTAB, no crackles or wheezes Abdomen: Normoactive bowel sounds, soft nondistended Extremities: no clubbing, cyanosis, or edema: pulses are +2 Neuro: Does not follow commands. Arousable to voice.  Skin:  No rashes, lesions   Resolved Hospital Problem list   none  Assessment & Plan:  Sara Schroeder is a 69 yo F with h/o HTN, MDD who presented to [facility] with new, acute onset seizure found to have hemorrhagic L frontal lobe mass on head CT and spiculated pulmonary nodule on CT chest, c/f primary bronchogenic carcinoma with metastasis.   Intracranial Hemorrhage of L Frontal lobe  L frontal lobe hemorrhagic mass: Pt presented with seizure and found to have 2.7x2.0x2.8 cm h/emorrhagic mass in L frontal lobe with surrounding edema and 2.70mm R midline shift on 5/4. VSS and no signs of overt elevated ICP. Neurosurgery consulted and following. Pt's son had GBM operated on at [place] 8 months ago and has since undergone extensive chemo and radiation. Pt's family initially inclined to attempt to go to [facility] for surgery but now intend to do it at [facility]. MRI brain on 5/4 showed Solitary hemorrhagic mass left frontal lobe measuring 2.2 x 2.3 x 2.8 cm favoring solitary metastatic deposit over primary brain tumor. Pt had worrisome pulmonary nodule on CT in Jan 2020 which was redemonstrated with interval growth as below.  -  Neurosurgery consulted. Appreciate recs. Plan for surgery on 5/6  - f/u surgical path post-operatively  - Decadron ongoing  Pulmonary Nodule: Pt had concerning pulmonary nodule on Chest CT in Jan 2020, which was not followed. CT chest abd pelvis on 5/4 showed Spiculated right middle lobe pulmonary nodule measuring 11 x 11 x 10 mm, minimally increased in size from prior exam c/f primary bronchogenic malignancy. There was not evidence of nodal metastasis however contrast was not used. 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  - Plan for neurosurgery 5/6 as above  - f/u neurosurgical path, this will inform management   Acute Respiratory Failure: Pt was unable to protect airway 2/2 multiple seizures and was intubated on 5/3. Most recent ABG 7.4/34.7/200/21, which is reassuring. CXR confirmed ETT placement 3 cm above carina. Pt continues to do well on PRVC FiO2 40, PEEP 5, Plat 13. We will keep her sedated and intubated given plan for surgery tomorrow. Afterwards, we can wean her off vent.   - Continue vent support  - VAP  per protocol   Seizures: 2/2 L frontal hemorrhage. Pt presented with multiple seizures and was unable to protect her airway. She received Ativan acutely as well as Keppra. Currently on Keppra and sedation with propofol, and has not had recurrence of seizures. Pt had EEG on 5/4 without seizures captured.  - Seizure precautions  - Keppra  - Sedation with propofol   Exophytic thyroid mass  Low TSH:  Pt's son emphasized concern regarding pt's thyroid history. Though it is unclear, he mentioned that she may have a history of thyroid nodules and disease, and remarked that she "took pills for thyroid back home" but has not taken anything since [place] the [place] TSH low at 0.124 on 5/4 however free T4 wnl at 0.99. Son denies sx of hyperthyroid in recent history  however he is unsure. Her presentation is not consistent with thyroid storm as pt has been normal from a thermoregulatory and vitals standpoint. CT chest abd pelvis revealed Exophytic 16 mm nodule from the thyroid isthmus. She did have thyroid uptake and scan at OSH which did not specify size. We will get a thyroid US to further evaluate and continue to monitor for signs/sx of thyroid disease.  - Thyroid US once   HTN: Pt has h/o HTN however does not take home medicine per son. Bps well controlled and stable. NTD   FEN/GI: Pt began tube feeds on 5/4 and is tolerating well. K 4.2, Mg 2.2, Phos 2.5.  - No need for lyte repletion today  - Daily BMP   Best practice:  Diet: Tube feeds  Pain/Anxiety/Delirium protocol (if indicated): Fentanyl/Propofol  VAP protocol (if indicated): per protocol  DVT prophylaxis: SCDs GI prophylaxis: Protonix  Glucose control: none indicated  Mobility: Intubated, sedated, bedrest Code Status: FULL Family Communication: 5/4 AM: Son at bedside.  Disposition: ICU   Labs   CBC: Recent Labs  Lab 06/26/19 2002 06/27/19 0401 06/28/19 0720  WBC 17.9*  --  22.9*  NEUTROABS 15.2*  --   --   HGB 13.1 11.9* 11.5*  HCT 38.7 35.0* 35.0*  MCV 91.7  --  95.4  PLT 230  --  706    Basic Metabolic Panel: Recent Labs  Lab 06/26/19 2002 06/27/19 0401 06/27/19 0928 06/27/19 1853 06/28/19 0720  NA 138 138 139  --  137  K 3.5 3.2* 4.0  --  4.2  CL 103  --  109  --  108  CO2 26  --  19*  --  21*  GLUCOSE 114*  --  147*  --  183*  BUN 19  --  15  --  25*  CREATININE 0.67  --  0.67  --  0.67  CALCIUM 9.3  --  8.9  --  9.3  MG  --   --  1.9  --  2.2  PHOS  --   --  2.9 2.5 2.5   GFR: Estimated Creatinine Clearance: 53.2 mL/min (by C-G formula based on SCr of 0.67 mg/dL). Recent Labs  Lab 06/26/19 2002 06/28/19 0720  WBC 17.9* 22.9*    Liver Function Tests: Recent Labs  Lab 06/26/19 2002 06/27/19 0928  AST 25 26  ALT 17 17  ALKPHOS 82 60  BILITOT  1.2 1.2  PROT 7.9 6.1*  ALBUMIN 4.8 3.4*   No results for input(s): LIPASE, AMYLASE in the last 168 hours. No results for input(s): AMMONIA in the last 168 hours.  ABG    Component Value Date/Time   PHART  7.391 06/27/2019 0401   PCO2ART 34.7 06/27/2019 0401   PO2ART 200 (H) 06/27/2019 0401   HCO3 21.0 06/27/2019 0401   TCO2 22 06/27/2019 0401   ACIDBASEDEF 3.0 (H) 06/27/2019 0401   O2SAT 100.0 06/27/2019 0401     Coagulation Profile: Recent Labs  Lab 06/26/19 2002  INR 0.9    Cardiac Enzymes: No results for input(s): CKTOTAL, CKMB, CKMBINDEX, TROPONINI in the last 168 hours.  HbA1C: No results found for: HGBA1C  CBG: Recent Labs  Lab 06/27/19 2002 06/27/19 2325 06/28/19 0338 06/28/19 0810  GLUCAP 154* 164* 157* 166*      Critical care time: The patient is critically ill with multiple organ systems failure and requires high complexity decision making for assessment and support, frequent evaluation and titration of therapies, application of advanced monitoring technologies and extensive interpretation of multiple databases.  Additional Critical care time 32 mins. This represents my time independent of the medical students/residents/NPs time taking care of the pt. This is excluding procedures.

## 2019-06-29 ENCOUNTER — Encounter (HOSPITAL_COMMUNITY): Payer: Self-pay | Admitting: Pulmonary Disease

## 2019-06-29 ENCOUNTER — Inpatient Hospital Stay (HOSPITAL_COMMUNITY): Payer: Medicare Other

## 2019-06-29 ENCOUNTER — Encounter (HOSPITAL_COMMUNITY)
Admission: AD | Disposition: A | Discharge status: Rehabilitation Facility | Payer: Self-pay | Source: Other Acute Inpatient Hospital | Attending: Critical Care Medicine

## 2019-06-29 DIAGNOSIS — G9389 Other specified disorders of brain: Secondary | ICD-10-CM

## 2019-06-29 DIAGNOSIS — G936 Cerebral edema: Secondary | ICD-10-CM

## 2019-06-29 DIAGNOSIS — J9600 Acute respiratory failure, unspecified whether with hypoxia or hypercapnia: Secondary | ICD-10-CM | POA: Diagnosis not present

## 2019-06-29 DIAGNOSIS — C7931 Secondary malignant neoplasm of brain: Secondary | ICD-10-CM | POA: Diagnosis not present

## 2019-06-29 DIAGNOSIS — I611 Nontraumatic intracerebral hemorrhage in hemisphere, cortical: Secondary | ICD-10-CM | POA: Diagnosis not present

## 2019-06-29 DIAGNOSIS — G40901 Epilepsy, unspecified, not intractable, with status epilepticus: Secondary | ICD-10-CM | POA: Diagnosis not present

## 2019-06-29 DIAGNOSIS — Z20822 Contact with and (suspected) exposure to covid-19: Secondary | ICD-10-CM | POA: Diagnosis not present

## 2019-06-29 DIAGNOSIS — G92 Toxic encephalopathy: Secondary | ICD-10-CM | POA: Diagnosis not present

## 2019-06-29 HISTORY — PX: APPLICATION OF CRANIAL NAVIGATION: SHX6578

## 2019-06-29 HISTORY — PX: CRANIOTOMY: SHX93

## 2019-06-29 LAB — TYPE AND SCREEN
ABO/RH(D): O POS
Antibody Screen: NEGATIVE

## 2019-06-29 LAB — CBC
HCT: 36.1 % (ref 36.0–46.0)
Hemoglobin: 11.7 g/dL — ABNORMAL LOW (ref 12.0–15.0)
MCH: 30.5 pg (ref 26.0–34.0)
MCHC: 32.4 g/dL (ref 30.0–36.0)
MCV: 94.3 fL (ref 80.0–100.0)
Platelets: 196 10*3/uL (ref 150–400)
RBC: 3.83 MIL/uL — ABNORMAL LOW (ref 3.87–5.11)
RDW: 13.9 % (ref 11.5–15.5)
WBC: 21.2 10*3/uL — ABNORMAL HIGH (ref 4.0–10.5)
nRBC: 0 % (ref 0.0–0.2)

## 2019-06-29 LAB — BASIC METABOLIC PANEL
Anion gap: 12 (ref 5–15)
BUN: 26 mg/dL — ABNORMAL HIGH (ref 8–23)
CO2: 25 mmol/L (ref 22–32)
Calcium: 9.5 mg/dL (ref 8.9–10.3)
Chloride: 104 mmol/L (ref 98–111)
Creatinine, Ser: 0.71 mg/dL (ref 0.44–1.00)
GFR calc Af Amer: >60 mL/min (ref >60)
GFR calc non Af Amer: >60 mL/min (ref >60)
Glucose, Bld: 159 mg/dL — ABNORMAL HIGH (ref 70–99)
Potassium: 4 mmol/L (ref 3.5–5.1)
Sodium: 141 mmol/L (ref 135–145)

## 2019-06-29 LAB — ABO/RH: ABO/RH(D): O POS

## 2019-06-29 LAB — PHOSPHORUS: Phosphorus: 2.7 mg/dL (ref 2.5–4.6)

## 2019-06-29 LAB — GLUCOSE, CAPILLARY
Glucose-Capillary: 144 mg/dL — ABNORMAL HIGH (ref 70–99)
Glucose-Capillary: 144 mg/dL — ABNORMAL HIGH (ref 70–99)
Glucose-Capillary: 127 mg/dL — ABNORMAL HIGH (ref 70–99)
Glucose-Capillary: 127 mg/dL — ABNORMAL HIGH (ref 70–99)
Glucose-Capillary: 126 mg/dL — ABNORMAL HIGH (ref 70–99)

## 2019-06-29 LAB — MAGNESIUM
Magnesium: 2.2 mg/dL (ref 1.7–2.4)
Magnesium: 2.1 mg/dL (ref 1.7–2.4)

## 2019-06-29 LAB — TRIGLYCERIDES: Triglycerides: 115 mg/dL (ref <150)

## 2019-06-29 SURGERY — CRANIOTOMY TUMOR EXCISION
Anesthesia: General | Site: Head

## 2019-06-29 MED ORDER — CEFAZOLIN SODIUM-DEXTROSE 2-3 GM-%(50ML) IV SOLR
INTRAVENOUS | Status: DC | PRN
  Administered 2019-06-29: 2 g via INTRAVENOUS

## 2019-06-29 MED ORDER — FENTANYL CITRATE (PF) 100 MCG/2ML IJ SOLN
INTRAMUSCULAR | Status: DC | PRN
  Administered 2019-06-29 (×2): 50 ug via INTRAVENOUS
  Administered 2019-06-29: 100 ug via INTRAVENOUS
  Administered 2019-06-29: 50 ug via INTRAVENOUS

## 2019-06-29 MED ORDER — LEVETIRACETAM IN NACL 500 MG/100ML IV SOLN
500.0000 mg | INTRAVENOUS | Status: AC
  Administered 2019-06-29: 500 mg via INTRAVENOUS
  Filled 2019-06-29: qty 100

## 2019-06-29 MED ORDER — PHENYLEPHRINE HCL-NACL 10-0.9 MG/250ML-% IV SOLN
INTRAVENOUS | Status: DC | PRN
  Administered 2019-06-29: 25 ug/min via INTRAVENOUS

## 2019-06-29 MED ORDER — FENTANYL CITRATE (PF) 250 MCG/5ML IJ SOLN
INTRAMUSCULAR | Status: AC
  Filled 2019-06-29: qty 5

## 2019-06-29 MED ORDER — THROMBIN 5000 UNITS EX SOLR: OROMUCOSAL | Status: DC | PRN

## 2019-06-29 MED ORDER — CEFAZOLIN SODIUM-DEXTROSE 2-4 GM/100ML-% IV SOLN
INTRAVENOUS | Status: AC
  Filled 2019-06-29: qty 100

## 2019-06-29 MED ORDER — THROMBIN 5000 UNITS EX SOLR
CUTANEOUS | Status: AC
  Filled 2019-06-29: qty 10000

## 2019-06-29 MED ORDER — THROMBIN 20000 UNITS EX SOLR
CUTANEOUS | Status: AC
  Filled 2019-06-29: qty 40000

## 2019-06-29 MED ORDER — SODIUM CHLORIDE 0.9 % IV SOLN: INTRAVENOUS | Status: DC | PRN

## 2019-06-29 MED ORDER — BACITRACIN ZINC 500 UNIT/GM EX OINT
TOPICAL_OINTMENT | CUTANEOUS | Status: AC
  Filled 2019-06-29: qty 28.35

## 2019-06-29 MED ORDER — PROPOFOL 10 MG/ML IV BOLUS
INTRAVENOUS | Status: AC
  Filled 2019-06-29: qty 20

## 2019-06-29 MED ORDER — LIDOCAINE-EPINEPHRINE 1 %-1:100000 IJ SOLN
INTRAMUSCULAR | Status: AC
  Filled 2019-06-29: qty 1

## 2019-06-29 MED ORDER — LEVETIRACETAM IN NACL 1000 MG/100ML IV SOLN
1000.0000 mg | Freq: Two times a day (BID) | INTRAVENOUS | Status: DC
  Administered 2019-06-30 – 2019-07-04 (×9): 1000 mg via INTRAVENOUS
  Filled 2019-06-29 (×9): qty 100

## 2019-06-29 MED ORDER — PROPOFOL 10 MG/ML IV BOLUS
INTRAVENOUS | Status: DC | PRN
  Administered 2019-06-29: 50 mg via INTRAVENOUS

## 2019-06-29 MED ORDER — BACITRACIN ZINC 500 UNIT/GM EX OINT
TOPICAL_OINTMENT | CUTANEOUS | Status: DC | PRN
  Administered 2019-06-29 (×2): 1 via TOPICAL

## 2019-06-29 MED ORDER — 0.9 % SODIUM CHLORIDE (POUR BTL) OPTIME
TOPICAL | Status: DC | PRN
  Administered 2019-06-29 (×3): 1000 mL

## 2019-06-29 MED ORDER — ROCURONIUM BROMIDE 10 MG/ML (PF) SYRINGE
PREFILLED_SYRINGE | INTRAVENOUS | Status: DC | PRN
  Administered 2019-06-29: 50 mg via INTRAVENOUS
  Administered 2019-06-29: 30 mg via INTRAVENOUS

## 2019-06-29 MED ORDER — THROMBIN 20000 UNITS EX SOLR: CUTANEOUS | Status: DC | PRN

## 2019-06-29 MED ORDER — LIDOCAINE-EPINEPHRINE 1 %-1:100000 IJ SOLN
INTRAMUSCULAR | Status: DC | PRN
  Administered 2019-06-29: 10 mL

## 2019-06-29 SURGICAL SUPPLY — 90 items
BENZOIN TINCTURE PRP APPL 2/3 (GAUZE/BANDAGES/DRESSINGS) IMPLANT
BLADE CLIPPER SURG (BLADE) ×3 IMPLANT
BLADE SAW GIGLI 16 STRL (MISCELLANEOUS) IMPLANT
BLADE SURG 15 STRL LF DISP TIS (BLADE) IMPLANT
BLADE SURG 15 STRL SS (BLADE)
BNDG GAUZE ELAST 4 BULKY (GAUZE/BANDAGES/DRESSINGS) IMPLANT
BNDG STRETCH 4X75 STRL LF (GAUZE/BANDAGES/DRESSINGS) IMPLANT
BUR ACORN 9.0 PRECISION (BURR) ×3 IMPLANT
BUR ROUND FLUTED 4 SOFT TCH (BURR) IMPLANT
BUR SPIRAL ROUTER 2.3 (BUR) ×3 IMPLANT
CANISTER SUCT 3000ML PPV (MISCELLANEOUS) ×6 IMPLANT
CATH VENTRIC 35X38 W/TROCAR LG (CATHETERS) IMPLANT
CLIP VESOCCLUDE MED 6/CT (CLIP) IMPLANT
CNTNR URN SCR LID CUP LEK RST (MISCELLANEOUS) ×2 IMPLANT
CONT SPEC 4OZ STRL OR WHT (MISCELLANEOUS) ×1
COVER BURR HOLE 7 (Orthopedic Implant) ×6 IMPLANT
COVER MAYO STAND STRL (DRAPES) IMPLANT
COVER WAND RF STERILE (DRAPES) ×3 IMPLANT
DECANTER SPIKE VIAL GLASS SM (MISCELLANEOUS) ×3 IMPLANT
DRAIN SUBARACHNOID (WOUND CARE) IMPLANT
DRAPE HALF SHEET 40X57 (DRAPES) ×3 IMPLANT
DRAPE MICROSCOPE LEICA (MISCELLANEOUS) IMPLANT
DRAPE NEUROLOGICAL W/INCISE (DRAPES) ×3 IMPLANT
DRAPE STERI IOBAN 125X83 (DRAPES) IMPLANT
DRAPE SURG 17X23 STRL (DRAPES) IMPLANT
DRAPE WARM FLUID 44X44 (DRAPES) ×3 IMPLANT
DRSG ADAPTIC 3X8 NADH LF (GAUZE/BANDAGES/DRESSINGS) IMPLANT
DRSG TELFA 3X8 NADH (GAUZE/BANDAGES/DRESSINGS) IMPLANT
DURAPREP 6ML APPLICATOR 50/CS (WOUND CARE) ×3 IMPLANT
ELECT REM PT RETURN 9FT ADLT (ELECTROSURGICAL) ×3
ELECTRODE REM PT RTRN 9FT ADLT (ELECTROSURGICAL) ×2 IMPLANT
EVACUATOR 1/8 PVC DRAIN (DRAIN) IMPLANT
EVACUATOR SILICONE 100CC (DRAIN) IMPLANT
FORCEPS BIPOLAR SPETZLER 8 1.0 (NEUROSURGERY SUPPLIES) ×6 IMPLANT
GAUZE 4X4 16PLY RFD (DISPOSABLE) IMPLANT
GAUZE SPONGE 4X4 12PLY STRL (GAUZE/BANDAGES/DRESSINGS) IMPLANT
GLOVE BIO SURGEON STRL SZ 6.5 (GLOVE) ×12 IMPLANT
GLOVE BIO SURGEON STRL SZ7 (GLOVE) IMPLANT
GLOVE BIO SURGEON STRL SZ7.5 (GLOVE) ×3 IMPLANT
GLOVE BIOGEL PI IND STRL 7.0 (GLOVE) IMPLANT
GLOVE BIOGEL PI IND STRL 7.5 (GLOVE) ×2 IMPLANT
GLOVE BIOGEL PI INDICATOR 7.0 (GLOVE)
GLOVE BIOGEL PI INDICATOR 7.5 (GLOVE) ×1
GLOVE ECLIPSE 6.5 STRL STRAW (GLOVE) ×3 IMPLANT
GLOVE EXAM NITRILE LRG STRL (GLOVE) IMPLANT
GLOVE EXAM NITRILE XL STR (GLOVE) IMPLANT
GLOVE EXAM NITRILE XS STR PU (GLOVE) IMPLANT
GOWN STRL REUS W/ TWL LRG LVL3 (GOWN DISPOSABLE) ×4 IMPLANT
GOWN STRL REUS W/ TWL XL LVL3 (GOWN DISPOSABLE) IMPLANT
GOWN STRL REUS W/TWL 2XL LVL3 (GOWN DISPOSABLE) IMPLANT
GOWN STRL REUS W/TWL LRG LVL3 (GOWN DISPOSABLE) ×2
GOWN STRL REUS W/TWL XL LVL3 (GOWN DISPOSABLE)
HEMOSTAT POWDER KIT SURGIFOAM (HEMOSTASIS) ×3 IMPLANT
HEMOSTAT SURGICEL 2X14 (HEMOSTASIS) ×3 IMPLANT
HOOK DURA 1/2IN (MISCELLANEOUS) ×3 IMPLANT
IV NS 1000ML (IV SOLUTION) ×1
IV NS 1000ML BAXH (IV SOLUTION) ×2 IMPLANT
KIT BASIN OR (CUSTOM PROCEDURE TRAY) ×3 IMPLANT
KIT DRAIN CSF ACCUDRAIN (MISCELLANEOUS) IMPLANT
KIT TURNOVER KIT B (KITS) ×3 IMPLANT
MARKER SPHERE PSV REFLC 13MM (MARKER) ×6 IMPLANT
NEEDLE HYPO 22GX1.5 SAFETY (NEEDLE) ×3 IMPLANT
NEEDLE SPNL 18GX3.5 QUINCKE PK (NEEDLE) IMPLANT
NS IRRIG 1000ML POUR BTL (IV SOLUTION) ×9 IMPLANT
PACK CRANIOTOMY CUSTOM (CUSTOM PROCEDURE TRAY) ×3 IMPLANT
PATTIES SURGICAL .25X.25 (GAUZE/BANDAGES/DRESSINGS) IMPLANT
PATTIES SURGICAL .5 X.5 (GAUZE/BANDAGES/DRESSINGS) IMPLANT
PATTIES SURGICAL .5 X3 (DISPOSABLE) IMPLANT
PATTIES SURGICAL 1/4 X 3 (GAUZE/BANDAGES/DRESSINGS) IMPLANT
PATTIES SURGICAL 1X1 (DISPOSABLE) IMPLANT
PIN MAYFIELD SKULL DISP (PIN) ×3 IMPLANT
PLATE BONE 12 2H TARGET XL (Plate) ×6 IMPLANT
RUBBERBAND STERILE (MISCELLANEOUS) IMPLANT
SCREW UNIII AXS SD 1.5X4 (Screw) ×36 IMPLANT
SPECIMEN JAR SMALL (MISCELLANEOUS) IMPLANT
SPONGE NEURO XRAY DETECT 1X3 (DISPOSABLE) IMPLANT
SPONGE SURGIFOAM ABS GEL 100 (HEMOSTASIS) ×3 IMPLANT
STAPLER VISISTAT 35W (STAPLE) ×3 IMPLANT
SUT ETHILON 3 0 FSL (SUTURE) IMPLANT
SUT ETHILON 3 0 PS 1 (SUTURE) IMPLANT
SUT MNCRL AB 3-0 PS2 18 (SUTURE) IMPLANT
SUT NURALON 4 0 TR CR/8 (SUTURE) ×9 IMPLANT
SUT SILK 0 TIES 10X30 (SUTURE) IMPLANT
SUT VIC AB 2-0 CP2 18 (SUTURE) ×3 IMPLANT
TOWEL GREEN STERILE (TOWEL DISPOSABLE) ×3 IMPLANT
TOWEL GREEN STERILE FF (TOWEL DISPOSABLE) ×3 IMPLANT
TRAY FOLEY MTR SLVR 16FR STAT (SET/KITS/TRAYS/PACK) ×3 IMPLANT
TUBE CONNECTING 12X1/4 (SUCTIONS) ×3 IMPLANT
UNDERPAD 30X30 (UNDERPADS AND DIAPERS) ×3 IMPLANT
WATER STERILE IRR 1000ML POUR (IV SOLUTION) ×3 IMPLANT

## 2019-06-29 NOTE — Brief Op Note (Signed)
06/29/2019  2:08 PM  PATIENT:  Sara Schroeder  68 y.o. female  PRE-OPERATIVE DIAGNOSIS:  Brain Tumor  POST-OPERATIVE DIAGNOSIS:  Brain Tumor  PROCEDURE:  Procedure(s): LEFT CRANIOTOMY FOR TUMOR EXCISION (Left) APPLICATION OF CRANIAL NAVIGATION (N/A)  SURGEON:  Surgeon(s) and Role:    * Justyn Boyson, Joyice Faster, MD - Primary    * Ashok Pall, MD - Assisting  PHYSICIAN ASSISTANT:   ANESTHESIA:   general  EBL:  150 mL   BLOOD ADMINISTERED:none  DRAINS: none   LOCAL MEDICATIONS USED:  LIDOCAINE   SPECIMEN:  Source of Specimen:  Left frontal brain tumor  DISPOSITION OF SPECIMEN:  PATHOLOGY  COUNTS:  YES  TOURNIQUET:  * No tourniquets in log *  DICTATION: .Note written in EPIC  PLAN OF CARE: Admit to inpatient   PATIENT DISPOSITION:  ICU - intubated and hemodynamically stable.   Delay start of Pharmacological VTE agent (>24hrs) due to surgical blood loss or risk of bleeding: yes

## 2019-06-29 NOTE — Anesthesia Postprocedure Evaluation (Signed)
Anesthesia Post Note  Patient: Jannie Yakel  Procedure(s) Performed: LEFT CRANIOTOMY FOR TUMOR EXCISION (Left Head) APPLICATION OF CRANIAL NAVIGATION (N/A )     Patient location during evaluation: SICU Anesthesia Type: General Level of consciousness: sedated Pain management: pain level controlled Vital Signs Assessment: post-procedure vital signs reviewed and stable Respiratory status: patient remains intubated per anesthesia plan Cardiovascular status: stable Postop Assessment: no apparent nausea or vomiting Anesthetic complications: no    Last Vitals:  Vitals:   06/29/19 1200 06/29/19 1500  BP: (!) 145/81 121/67  Pulse: 88 69  Resp: 17 20  Temp: 37.1 C 36.5 C  SpO2: 100% 99%    Last Pain:  Vitals:   06/29/19 0400  TempSrc: Bladder    LLE Motor Response: Purposeful movement (06/29/19 1500)   RLE Motor Response: Purposeful movement (06/29/19 1500)        Nathalee Smarr S

## 2019-06-29 NOTE — Op Note (Signed)
PATIENT: Sara Schroeder 21  DAY OF SURGERY: 06/29/19   PRE-OPERATIVE DIAGNOSIS:  Brain tumor   POST-OPERATIVE DIAGNOSIS:  Brain tumor   PROCEDURE:  Left frontal craniotomy for tumor resection   SURGEON:  Surgeon(s) and Role:    Judith Part, MD - Primary    Ashok Pall, MD - Assisting   ANESTHESIA: ETGA   BRIEF HISTORY: This is a 69 year old woman who presented with multiple seizures and was intubated. The patient was found to have a hemorrhagic left frontal brain tumor. I therefore recommended resection. This was discussed with the patient's family as well as risks, benefits, and alternatives and wished to proceed with surgery.   OPERATIVE DETAIL: The patient was taken to the operating room and placed on the OR table in the supine position. A formal time out was performed with two patient identifiers and confirmed the operative site. Anesthesia was induced by the anesthesia team. The [place] head holder was applied to the head and a registration array was attached to the Oliver. This was co-registered with the patient's preoperative imaging, the fit appeared to be acceptable. Using frameless stereotaxy, the operative trajectory was planned and the incision was marked. Hair was clipped with surgical clippers over the incision and the area was then prepped and draped in a sterile fashion.  A curvilinear incision was placed in the left frontal region. Soft tissues were dissected and retracted. A craniotomy flap was turned, dura opened, and brain exposed. Hemosiderin was immediately obvious at the surface. This was confirmed to be the location of the tumor on stereotaxy. The tumor was dissected circumferentially and removed en bloc, then sent to pathology. Hemostasis was confirmed, dura was sutured back into place, and the craniotomy flap was replaced with titanium plates and screws. All instrument and sponge counts were correct, the incision was then closed in layers. The patient was then  returned to anesthesia for emergence. No apparent complications at the completion of the procedure.   EBL:  189mL   DRAINS: none   SPECIMENS: left frontal brain tumor   Judith Part, MD 06/29/19 2:09 PM

## 2019-06-29 NOTE — Transfer of Care (Signed)
Immediate Anesthesia Transfer of Care Note  Patient: Sara Schroeder  Procedure(s) Performed: LEFT CRANIOTOMY FOR TUMOR EXCISION (Left Head) APPLICATION OF CRANIAL NAVIGATION (N/A )  Patient Location: ICU  Anesthesia Type:General  Level of Consciousness: Patient remains intubated per anesthesia plan  Airway & Oxygen Therapy: Patient remains intubated per anesthesia plan and Patient placed on Ventilator (see vital sign flow sheet for setting)  Post-op Assessment: Report given to RN and Post -op Vital signs reviewed and stable  Post vital signs: Reviewed and stable  Last Vitals:  Vitals Value Taken Time  BP    Temp 36.3 C 06/29/19 1426  Pulse 70 06/29/19 1426  Resp 18 06/29/19 1426  SpO2 98 % 06/29/19 1426  Vitals shown include unvalidated device data.  Last Pain:  Vitals:   06/29/19 0400  TempSrc: Bladder         Complications: No apparent anesthesia complications

## 2019-06-29 NOTE — Anesthesia Preprocedure Evaluation (Signed)
Anesthesia Evaluation  Patient identified by MRN, date of birth, ID band Patient unresponsive    Reviewed: Allergy & Precautions, NPO status , Patient's Chart, lab work & pertinent test results, Unable to perform ROS - Chart review only  Airway Mallampati: Intubated  TM Distance: >3 FB Neck ROM: Full    Dental no notable dental hx.    Pulmonary  Pulmonary nodule middle lobe, increased in size since previous exam   Pulmonary exam normal breath sounds clear to auscultation       Cardiovascular hypertension, Normal cardiovascular exam Rhythm:Regular Rate:Normal     Neuro/Psych Seizures -,  Hemorrhagic brain mass negative psych ROS   GI/Hepatic negative GI ROS, Neg liver ROS,   Endo/Other  negative endocrine ROS  Renal/GU negative Renal ROS  negative genitourinary   Musculoskeletal negative musculoskeletal ROS (+)   Abdominal   Peds negative pediatric ROS (+)  Hematology  (+) anemia ,   Anesthesia Other Findings   Reproductive/Obstetrics negative OB ROS                             Anesthesia Physical Anesthesia Plan  ASA: IV  Anesthesia Plan: General   Post-op Pain Management:    Induction: Intravenous  PONV Risk Score and Plan: 3 and Ondansetron, Dexamethasone and Treatment may vary due to age or medical condition  Airway Management Planned: Oral ETT  Additional Equipment: Arterial line  Intra-op Plan:   Post-operative Plan: Post-operative intubation/ventilation  Informed Consent: I have reviewed the patients History and Physical, chart, labs and discussed the procedure including the risks, benefits and alternatives for the proposed anesthesia with the patient or authorized representative who has indicated his/her understanding and acceptance.     Dental advisory given  Plan Discussed with: CRNA and Surgeon  Anesthesia Plan Comments:         Anesthesia Quick  Evaluation

## 2019-06-29 NOTE — Anesthesia Procedure Notes (Signed)
Arterial Line Insertion Start/End5/07/2019 12:30 PM, 06/29/2019 12:35 PM Performed by: Verdie Drown, CRNA, CRNA  Patient location: OR. Preanesthetic checklist: patient identified, IV checked, surgical consent, monitors and equipment checked, pre-op evaluation and timeout performed Left, radial was placed Catheter size: 20 G Hand hygiene performed  and maximum sterile barriers used   Attempts: 1 Procedure performed without using ultrasound guided technique. Ultrasound Notes:anatomy identified Following insertion, Biopatch and dressing applied.

## 2019-06-29 NOTE — Progress Notes (Signed)
69 year old [place] woman, admitted with seizure, found to have hemorrhagic left frontal lobe mass 2.8 cm  CT chest/abdomen/pelvis from 5/4 >> spiculated right middle lobe nodule 11 mm increased from 10 mm in 02/2019 and 16 mm thyroid nodule noted prior  EEG negative  Vitals:   06/29/19 0800 06/29/19 0900  BP: (!) 153/81 (!) 154/73  Pulse: 70 82  Resp: 20 (!) 24  Temp: (!) 97.5 F (36.4 C) (!) 97.3 F (36.3 C)  SpO2: 100% 100%   On exam -critically ill, sedated on propofol, mild intermittent agitation, bilateral ventilated breath sounds, S1-S2 regular, moves all 4 extremities, soft nontender abdomen.  Labs show normal electrolytes, stable leukocytosis.  Impression/plan Brain mass -plan for resection today -Continue Decadron for vasogenic edema  Seizure-continue Keppra, use propofol for sedation with goal RASS -1, more liberal use of intermittent fentanyl for pain post procedure.  Acute respiratory failure -hopeful for quick extubation after surgery  Thyroid nodule-ultrasound reviewed, FNA recommended versus 1 year follow-up Solitary pulmonary nodule -slight growth from 10 to 11 mm from January 2021 to now, suspicious for primary lung malignancy, will need PET scan as outpatient  Rakesh V. Elsworth Soho MD

## 2019-06-29 NOTE — Progress Notes (Signed)
Neurosurgery Service Post-operative progress note  Assessment & Plan: 69 y.o. woman s/p crani for tumor resection  -okay for extubation from a neurosurgical standpoint -MRI w/wo tomorrow (ordered) -hold SQH until [place]  06/29/19 2:25 PM

## 2019-06-29 NOTE — Progress Notes (Signed)
During patient's bath, patient became stiff and had a gaze to the right while blinking repetitively. It lasted about 30 seconds and then stopped. Dr. Christella Noa made aware, new verbal order to increase IV Keppra from 500mg  to 1000mg  every 12 hours. Will give another 500mg  dose of keppra now to meet full dose. Will continue to monitor and give ativan PRN if needed.

## 2019-06-29 NOTE — Progress Notes (Signed)
NAMEBrittish Schroeder, MRN:  [id number], DOB:  Nov 12, 1950, LOS: 2 ADMISSION DATE:  06/27/2019, CONSULTATION DATE:  06/26/19 REFERRING MD:  [facility], CHIEF COMPLAINT:  Seizure  Brief History   [name] is a 69 yo F with h/o HTN, anxiety, and MDD who presented with seizure and was found to have a large hemorrhagic L frontal lobe mass. Briefly, she had new onset seizure at [facility] on 5/3 characterized by R-sided convulsions and twitching. EMS arrived and she had another seizure in transit. She was given Ativan and Keppra at [facility] ED. Vitals on presentation significant for elevated BP of 170s/90s, HR 100s, T37. Labs were unremarkable except WBC of 17.2. CT head showed 2.7x2.0x2.8cm hemorrhagic mass in the L frontal lobe. Pt was intubated for airway protection and transferred to [facility] ICU. Pt's son and granddaughter deny history of seizures.   Past Medical History  HTN Depression Anxiety  Significant Hospital Events   5/3: Multiple seizures, intubated for airway protection.  5/3: L frontal hemorrhagic mass on CT head  5/4: Transferred to [facility] Neuro ICU  Consults:  Neurosurgery (5/4)  Procedures:  5/3: ETT   Significant Diagnostic Tests:  5/3 CT Head noncon> L frontal lobe hemorrhage 2.7x2.0 x 2.8 cm with surrounding edema. 72mm rightward midline shift. No herniation.  5/3 CXR> ETT 77mm above carina. Enteric tube above JE junction. Recommend retraction of ETT 2-3 cm, advancement of Enteric tube 8 cm. 5/4 CXR: ETT 2-3 cm above carine. OG tube in stomach.  5/4 MRI brain: Solitary hemorrhagic mass left frontal lobe measuring 2.2 x 2.3 x 2.8 cm. Extensive vasogenic edema. 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 Primary brain tumor not likely. 5/4: CT Chest, Abd, Pelvis >> 1. Spiculated right middle lobe pulmonary nodule measuring 11 x 11 x 10 mm, only minimally increased in size from prior exam. This is suspicious for primary bronchogenic malignancy. Evaluation of the hila is limited in the  absence of IV contrast, allowing for this limitation, no evidence of nodal metastasis. 2. No other evidence of metastatic disease or primary malignancy in the chest, abdomen, or pelvis. 3. Exophytic 16 mm nodule from the thyroid isthmus, patient had previous thyroid uptake and scan at an outside institution that reported this nodule, however size was not specified, and those images are not available. No thyroid ultrasounds are available for review. Therefore, recommend thyroid US (ref: J Am Coll Radiol. 2015 Feb;12(2): 143-50), unless performed elsewhere. 4. Dependent opacities in the lung bases favor atelectasis. 5/5: US Thyroid: >> 1.6 cm isthmus nodule technically meets criteria for fine-needle aspiration. The nodule is borderline in size for biopsy and 1 year follow-up ultrasound could also be considered.  Micro Data:  5/3: SARS Cov2/Influenza A/Influenza B: Negative  Antimicrobials:  None    Interim history/subjective:  5/6: NAE. Pt intubated, sedated, and comfortable. Pt able to sit up with decreased sedation yesterday. Continues to tolerate tube feeds but is NPO currently for surgery today. VSS on minimal vent settings of 400/30%/+5. Adequate UOP of 2.1L. Thyroid US showed 1.6 cm isthmus nodule. Morning labs unremarkable.   5/5: NAE. Pt intubated, sedated, and comfortable on below settings. Began tube feeds and tolerating well. VSS. TSH low at 0.124 but free T4 wnl. WBC 22.9 from 17, K 4.2 Mg 2.2. CT chest, abd, pelvis showed spiculated pulmonary nodule in R middle lobe c/f primary bronchogenic malignancy. Also showed exophytic 16 mm thyroid nodule. Brain MRI showed solitary hemorrhagic mass favoring solitary metastasis.  5/4: NAE. Pt received 1x Labetalol prn and VSS since, BP 110s/60s, HR 80s. SpO2 100 on PRVC FiO2 40%, PEEP 5, Plat 13. ABG at 0400 was 7.4/34.7/200/21. Morning labs significant for K 4.0 from 3.2, TSH 0.124. MRI brain this am showed Solitary 2.6 cm left lateral  frontal mass compatible with hemorrhagic neoplasm. Pt did have concerning lung nodule in January 2020 that has not been followed. Son at the bedside, says they would like to undergo surgery here if it is "simple enough".   Objective   Blood pressure (!) 154/73, pulse 82, temperature (!) 97.3 F (36.3 C), resp. rate (!) 24, height 5\' 2"  (1.575 m), weight 57 kg, SpO2 100 %.    Vent Mode: PRVC FiO2 (%):  [30 %] 30 % Set Rate:  [20 bmp] 20 bmp Vt Set:  [400 mL] 400 mL PEEP:  [5 cmH20] 5 cmH20 Pressure Support:  [5 cmH20] 5 cmH20 Plateau Pressure:  [14 cmH20-17 cmH20] 17 cmH20   Intake/Output Summary (Last 24 hours) at 06/29/2019 0914 Last data filed at 06/29/2019 0700 Gross per 24 hour  Intake 1992.97 ml  Output 2075 ml  Net -82.03 ml   Filed Weights   06/27/19 0315 06/28/19 0446 06/29/19 0500  Weight: 57.8 kg 57.6 kg 57 kg    Examination: Gen: Intubated. Appears comfortable, sedated but arouses to voice.  HEENT: atraumatic, normocephalic, pupils equal, responsive to light Neck: no cervical lymphadenopathy, no JVD, no palpable thyroid nodule however neck fullness present  Heart: RRR, S1, S2, no M/R/G, no chest wall tenderness Lungs: CTAB, no crackles or wheezes Abdomen: Normoactive bowel sounds, soft nondistended Extremities: no clubbing, cyanosis, or edema: pulses are +2 Neuro: Does not follow commands from son in [place]. Arousable to voice.  Skin:  No rashes, lesions   Resolved Hospital Problem list   none  Assessment & Plan:  Sara Schroeder is a 69 yo F with h/o HTN, MDD who presented to [facility] with new, acute onset seizure found to have hemorrhagic L frontal lobe mass on head CT and spiculated pulmonary nodule on CT chest, c/f primary bronchogenic carcinoma with metastasis.   Intracranial Hemorrhage of L Frontal lobe  L frontal lobe hemorrhagic mass: Pt presented with seizure and found to have 2.7x2.0x2.8 cm h/emorrhagic mass in L frontal lobe with surrounding edema and 2.21mm R midline  shift on 5/4. VSS and no signs of overt elevated ICP. Neurosurgery consulted and following. Pt's son had GBM operated on at [place] 8 months ago and has since undergone extensive chemo and radiation. Pt's family initially inclined to attempt to go to [facility] for surgery but now intend to do it at [facility]. MRI brain on 5/4 showed Solitary hemorrhagic mass left frontal lobe measuring 2.2 x 2.3 x 2.8 cm favoring solitary metastatic deposit over primary brain tumor. Pt had worrisome pulmonary nodule on CT in Jan 2020 which was redemonstrated with interval growth as below.  - Neurosurgery consulted. Appreciate recs. Plan for surgery on 5/6  - f/u surgical path post-operatively  - Decadron ongoing  Pulmonary Nodule: Pt had concerning pulmonary nodule on Chest CT in Jan 2020, which was not followed. CT chest abd pelvis on 5/4 showed Spiculated right middle lobe pulmonary nodule measuring 11 x 11 x 10 mm, minimally increased in size from prior exam c/f primary bronchogenic malignancy. There was not evidence of nodal metastasis however contrast was not used. 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  - Plan for neurosurgery 5/6 as above  - f/u neurosurgical path, this will inform management   Acute Respiratory Failure: Pt was unable to protect airway 2/2 multiple seizures and was intubated on 5/3. Most recent ABG 7.4/34.7/200/21, which is reassuring. CXR confirmed ETT placement 3 cm above carina. Pt continues to do well on PRVC FiO2 30, PEEP 5, Plat 13. Did well with SBT on 5/5 and was able to sit up. We will keep her sedated and intubated given plan for surgery today. After surgery, plan to wean sedation and trial extubation.  - Continue vent support  - VAP per protocol   Seizures: 2/2 L frontal hemorrhage. Pt presented with multiple seizures and was unable to protect her airway. She received  Ativan acutely as well as Keppra. Currently on Keppra and sedation with propofol, and has not had recurrence of seizures. Pt had EEG on 5/4 without seizures captured.  - Seizure precautions  - Keppra  - Sedation with propofol   Exophytic thyroid mass  Low TSH:  Pt's son emphasized concern regarding pt's thyroid history. Though it is unclear, he mentioned that she may have a history of thyroid nodules and disease, and remarked that she "took pills for thyroid back home" but has not taken anything since [place] the [place] TSH low at 0.124 on 5/4 however free T4 wnl at 0.99. Son denies sx of hyperthyroid in recent history however he is unsure. Her presentation is not consistent with thyroid storm as pt has been normal from a thermoregulatory and vitals standpoint. CT chest abd pelvis revealed Exophytic 16 mm nodule from the thyroid isthmus. She did have thyroid uptake and scan at OSH which did not specify size. Thyroid US showed 1.6 cm isthmus nodule technically meets criteria for FNA vs 1 year f/u US. Son was made aware and will pursue this decision after her surgery settles.  - F/u US in 1 year vs FNA for thyroid nodule   HTN: Pt has h/o HTN however does not take home medicine per son. Bps well controlled and stable. NTD   FEN/GI: Pt began tube feeds on 5/4 and is tolerating well. Currently NPO for surgery. Lytes wnl.  - No need for lyte repletion today  - Daily BMP   Best practice:  Diet: Tube feeds  Pain/Anxiety/Delirium protocol (if indicated): Fentanyl/Propofol  VAP protocol (if indicated): per protocol  DVT prophylaxis: SCDs GI prophylaxis: Protonix  Glucose control: none indicated  Mobility: Intubated, sedated, bedrest Code Status: FULL Family Communication: 5/6 AM: Son at bedside.  Disposition: ICU   Labs   CBC: Recent Labs  Lab 06/26/19 2002 06/27/19 0401 06/28/19 0720 06/29/19 0635  WBC 17.9*  --  22.9* 21.2*  NEUTROABS 15.2*  --   --   --   HGB 13.1 11.9* 11.5* 11.7*    HCT 38.7 35.0* 35.0* 36.1  MCV 91.7  --  95.4 94.3  PLT 230  --  203 188    Basic Metabolic Panel: Recent Labs  Lab 06/26/19 2002 06/27/19 0401 06/27/19 0928 06/27/19 1853 06/28/19 0720 06/28/19 1626 06/29/19 0635  NA 138 138 139  --  137  --  141  K 3.5 3.2* 4.0  --  4.2  --  4.0  CL 103  --  109  --  108  --  104  CO2 26  --  19*  --  21*  --  25  GLUCOSE 114*  --  147*  --  183*  --  159*  BUN 19  --  15  --  25*  --  26*  CREATININE 0.67  --  0.67  --  0.67  --  0.71  CALCIUM 9.3  --  8.9  --  9.3  --  9.5  MG  --   --  1.9  --  2.2 2.4 2.2  PHOS  --   --  2.9 2.5 2.5 2.2* 2.7   GFR: Estimated Creatinine Clearance: 53.2 mL/min (by C-G formula based on SCr of 0.71 mg/dL). Recent Labs  Lab 06/26/19 2002 06/28/19 0720 06/29/19 0635  WBC 17.9* 22.9* 21.2*    Liver Function Tests: Recent Labs  Lab 06/26/19 2002 06/27/19 0928  AST 25 26  ALT 17 17  ALKPHOS 82 60  BILITOT 1.2 1.2  PROT 7.9 6.1*  ALBUMIN 4.8 3.4*   No results for input(s): LIPASE, AMYLASE in the last 168 hours. No results for input(s): AMMONIA in the last 168 hours.  ABG    Component Value Date/Time   PHART 7.391 06/27/2019 0401   PCO2ART 34.7 06/27/2019 0401   PO2ART 200 (H) 06/27/2019 0401   HCO3 21.0 06/27/2019 0401   TCO2 22 06/27/2019 0401   ACIDBASEDEF 3.0 (H) 06/27/2019 0401   O2SAT 100.0 06/27/2019 0401     Coagulation Profile: Recent Labs  Lab 06/26/19 2002  INR 0.9    Cardiac Enzymes: No results for input(s): CKTOTAL, CKMB, CKMBINDEX, TROPONINI in the last 168 hours.  HbA1C: No results found for: HGBA1C  CBG: Recent Labs  Lab 06/28/19 1607 06/28/19 1950 06/28/19 2338 06/29/19 0340 06/29/19 0730  GLUCAP 137* 171* 148* 144* 144*      Critical care time: The patient is critically ill with multiple organ systems failure and requires high complexity decision making for assessment and support, frequent evaluation and titration of therapies, application of  advanced monitoring technologies and extensive interpretation of multiple databases.  Additional Critical care time 32 mins. This represents my time independent of the medical students/residents/NPs time taking care of the pt. This is excluding procedures.    Ivin Poot, MS4

## 2019-06-29 NOTE — Progress Notes (Signed)
Neurosurgery Service Progress Note  Subjective: NAE ON   Objective: Vitals:   06/29/19 0700 06/29/19 0731 06/29/19 0753 06/29/19 0800  BP: (!) 148/72 (!) 159/68 (!) 159/68 (!) 153/81  Pulse: 62 73 63 70  Resp: 20 20 20 20   Temp: (!) 97.3 F (36.3 C) (!) 97.3 F (36.3 C)  (!) 97.5 F (36.4 C)  TempSrc:      SpO2: 100% 100% 100% 100%  Weight:      Height:       Temp (24hrs), Avg:97.9 F (36.6 C), Min:97 F (36.1 C), Max:98.8 F (37.1 C)  CBC Latest Ref Rng & Units 06/29/2019 06/28/2019 06/27/2019  WBC 4.0 - 10.5 K/uL 21.2(H) 22.9(H) -  Hemoglobin 12.0 - 15.0 g/dL 11.7(L) 11.5(L) 11.9(L)  Hematocrit 36.0 - 46.0 % 36.1 35.0(L) 35.0(L)  Platelets 150 - 400 K/uL 196 203 -   BMP Latest Ref Rng & Units 06/29/2019 06/28/2019 06/27/2019  Glucose 70 - 99 mg/dL 159(H) 183(H) 147(H)  BUN 8 - 23 mg/dL 26(H) 25(H) 15  Creatinine 0.44 - 1.00 mg/dL 0.71 0.67 0.67  Sodium 135 - 145 mmol/L 141 137 139  Potassium 3.5 - 5.1 mmol/L 4.0 4.2 4.0  Chloride 98 - 111 mmol/L 104 108 109  CO2 22 - 32 mmol/L 25 21(L) 19(L)  Calcium 8.9 - 10.3 mg/dL 9.5 9.3 8.9    Intake/Output Summary (Last 24 hours) at 06/29/2019 0901 Last data filed at 06/29/2019 0700 Gross per 24 hour  Intake 1992.97 ml  Output 2075 ml  Net -82.03 ml    Current Facility-Administered Medications:  .  acetaminophen (TYLENOL) tablet 650 mg, 650 mg, Per Tube, Q4H PRN, Audria Nine, DO .  chlorhexidine gluconate (MEDLINE KIT) (PERIDEX) 0.12 % solution 15 mL, 15 mL, Mouth Rinse, BID, Anders Simmonds, MD, 15 mL at 06/29/19 0813 .  Chlorhexidine Gluconate Cloth 2 % PADS 6 each, 6 each, Topical, Daily, Ashok Pall, MD, 6 each at 06/29/19 0300 .  dexamethasone (DECADRON) injection 4 mg, 4 mg, Intravenous, Q6H, Eustace Moore, MD, 4 mg at 06/29/19 0559 .  docusate (COLACE) 50 MG/5ML liquid 100 mg, 100 mg, Per Tube, BID, Bowser, Laurel Dimmer, NP, 100 mg at 06/28/19 2110 .  feeding supplement (PRO-STAT SUGAR FREE 64) liquid 30 mL, 30 mL, Per  Tube, Daily, Ruthann Cancer, Jessica, DO, 30 mL at 06/28/19 1037 .  feeding supplement (VITAL AF 1.2 CAL) liquid 1,000 mL, 1,000 mL, Per Tube, Continuous, Audria Nine, DO, Stopped at 06/29/19 0001 .  fentaNYL (SUBLIMAZE) injection 25 mcg, 25 mcg, Intravenous, Q15 min PRN, Bowser, Grace E, NP .  fentaNYL (SUBLIMAZE) injection 25-100 mcg, 25-100 mcg, Intravenous, Q30 min PRN, Bowser, Laurel Dimmer, NP, 100 mcg at 06/29/19 0413 .  lactated ringers infusion, , Intravenous, Continuous, Bowser, Grace E, NP, Last Rate: 50 mL/hr at 06/29/19 0700, Rate Verify at 06/29/19 0700 .  levETIRAcetam (KEPPRA) IVPB 500 mg/100 mL premix, 500 mg, Intravenous, Q12H, Bowser, Grace E, NP, Last Rate: 400 mL/hr at 06/29/19 0812, 500 mg at 06/29/19 0812 .  LORazepam (ATIVAN) injection 2 mg, 2 mg, Intravenous, Q5 min PRN, Bowser, Laurel Dimmer, NP, 2 mg at 06/27/19 0321 .  MEDLINE mouth rinse, 15 mL, Mouth Rinse, 10 times per day, Anders Simmonds, MD, 15 mL at 06/29/19 0559 .  ondansetron (ZOFRAN) injection 4 mg, 4 mg, Intravenous, Q6H PRN, Bowser, Grace E, NP .  pantoprazole (PROTONIX) injection 40 mg, 40 mg, Intravenous, QHS, Bowser, Grace E, NP, 40 mg at 06/28/19 2110 .  polyethylene glycol (MIRALAX / GLYCOLAX) packet 17 g, 17 g, Per Tube, Daily PRN, Audria Nine, DO .  propofol (DIPRIVAN) 1000 MG/100ML infusion, 0-50 mcg/kg/min, Intravenous, Continuous, Blenda Nicely, RPH, Last Rate: 13.82 mL/hr at 06/29/19 0700, 40 mcg/kg/min at 06/29/19 0700   Physical Exam: Intubated, sedated, eyes open to voice, trying to sit up in bed, MAEx4 equally  Assessment & Plan: 69 y.o. woman p/w seizures, MRI brain with left frontal 23x34m hemorrhagic mass, CT CAP with pulmonary nodule with some growth since 2020 CT.  -OR today for resection, discussed with son at bedside extensively  TJudith Part 06/29/19 9:01 AM

## 2019-06-30 ENCOUNTER — Inpatient Hospital Stay (HOSPITAL_COMMUNITY): Payer: Medicare Other

## 2019-06-30 ENCOUNTER — Encounter: Payer: Self-pay | Admitting: *Deleted

## 2019-06-30 DIAGNOSIS — R918 Other nonspecific abnormal finding of lung field: Secondary | ICD-10-CM | POA: Diagnosis not present

## 2019-06-30 DIAGNOSIS — I612 Nontraumatic intracerebral hemorrhage in hemisphere, unspecified: Secondary | ICD-10-CM | POA: Diagnosis not present

## 2019-06-30 DIAGNOSIS — G936 Cerebral edema: Secondary | ICD-10-CM | POA: Diagnosis not present

## 2019-06-30 DIAGNOSIS — G9389 Other specified disorders of brain: Secondary | ICD-10-CM | POA: Diagnosis not present

## 2019-06-30 DIAGNOSIS — C342 Malignant neoplasm of middle lobe, bronchus or lung: Secondary | ICD-10-CM | POA: Diagnosis not present

## 2019-06-30 DIAGNOSIS — C801 Malignant (primary) neoplasm, unspecified: Secondary | ICD-10-CM

## 2019-06-30 DIAGNOSIS — C7931 Secondary malignant neoplasm of brain: Secondary | ICD-10-CM

## 2019-06-30 DIAGNOSIS — R569 Unspecified convulsions: Secondary | ICD-10-CM | POA: Diagnosis not present

## 2019-06-30 DIAGNOSIS — J9600 Acute respiratory failure, unspecified whether with hypoxia or hypercapnia: Secondary | ICD-10-CM | POA: Diagnosis not present

## 2019-06-30 LAB — BASIC METABOLIC PANEL
Anion gap: 12 (ref 5–15)
Anion gap: 13 (ref 5–15)
BUN: 19 mg/dL (ref 8–23)
BUN: 22 mg/dL (ref 8–23)
CO2: 19 mmol/L — ABNORMAL LOW (ref 22–32)
CO2: 23 mmol/L (ref 22–32)
Calcium: 8.6 mg/dL — ABNORMAL LOW (ref 8.9–10.3)
Calcium: 8.7 mg/dL — ABNORMAL LOW (ref 8.9–10.3)
Chloride: 116 mmol/L — ABNORMAL HIGH (ref 98–111)
Chloride: 104 mmol/L (ref 98–111)
Creatinine, Ser: 0.91 mg/dL (ref 0.44–1.00)
Creatinine, Ser: 0.71 mg/dL (ref 0.44–1.00)
GFR calc Af Amer: >60 mL/min (ref >60)
GFR calc Af Amer: >60 mL/min (ref >60)
GFR calc non Af Amer: >60 mL/min (ref >60)
GFR calc non Af Amer: >60 mL/min (ref >60)
Glucose, Bld: 104 mg/dL — ABNORMAL HIGH (ref 70–99)
Glucose, Bld: 144 mg/dL — ABNORMAL HIGH (ref 70–99)
Potassium: 4.4 mmol/L (ref 3.5–5.1)
Potassium: 3.6 mmol/L (ref 3.5–5.1)
Sodium: 147 mmol/L — ABNORMAL HIGH (ref 135–145)
Sodium: 140 mmol/L (ref 135–145)

## 2019-06-30 LAB — CBC
HCT: 32.9 % — ABNORMAL LOW (ref 36.0–46.0)
Hemoglobin: 10.7 g/dL — ABNORMAL LOW (ref 12.0–15.0)
MCH: 30.5 pg (ref 26.0–34.0)
MCHC: 32.5 g/dL (ref 30.0–36.0)
MCV: 93.7 fL (ref 80.0–100.0)
Platelets: 165 10*3/uL (ref 150–400)
RBC: 3.51 MIL/uL — ABNORMAL LOW (ref 3.87–5.11)
RDW: 14.1 % (ref 11.5–15.5)
WBC: 19.3 10*3/uL — ABNORMAL HIGH (ref 4.0–10.5)
nRBC: 0 % (ref 0.0–0.2)

## 2019-06-30 LAB — GLUCOSE, CAPILLARY
Glucose-Capillary: 125 mg/dL — ABNORMAL HIGH (ref 70–99)
Glucose-Capillary: 129 mg/dL — ABNORMAL HIGH (ref 70–99)
Glucose-Capillary: 130 mg/dL — ABNORMAL HIGH (ref 70–99)
Glucose-Capillary: 134 mg/dL — ABNORMAL HIGH (ref 70–99)
Glucose-Capillary: 144 mg/dL — ABNORMAL HIGH (ref 70–99)
Glucose-Capillary: 158 mg/dL — ABNORMAL HIGH (ref 70–99)

## 2019-06-30 LAB — MAGNESIUM
Magnesium: 2.5 mg/dL — ABNORMAL HIGH (ref 1.7–2.4)
Magnesium: 2.1 mg/dL (ref 1.7–2.4)

## 2019-06-30 LAB — PHOSPHORUS
Phosphorus: 3.2 mg/dL (ref 2.5–4.6)
Phosphorus: 3.8 mg/dL (ref 2.5–4.6)

## 2019-06-30 LAB — TRIGLYCERIDES: Triglycerides: 179 mg/dL — ABNORMAL HIGH (ref <150)

## 2019-06-30 IMAGING — MR MR HEAD WO/W CM
15 of 17 series · 40 of 48 positions shown · IV contrast (gadavist)
Comparison: [DATE]

CLINICAL DATA: Post mass resection

EXAM:
MRI HEAD WITHOUT AND WITH CONTRAST
TECHNIQUE: Multiplanar, multiecho pulse sequences of the brain and surrounding
structures were obtained without and with intravenous contrast.
CONTRAST:  6mL GADAVIST GADOBUTROL 1 MMOL/ML IV SOLN

[Series 5: DWI · axial · 3.0mm · 0.88mm/px · z∈[-116,+21]mm · 6 of 94 slices shown (1 of 4)]
[im 1/94]
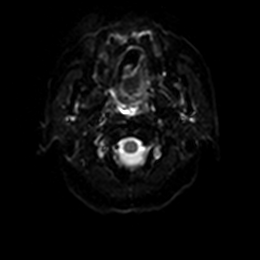
[im 19/94]
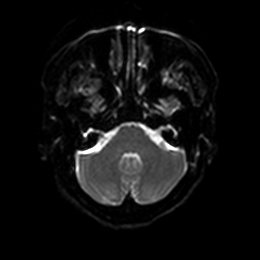
[im 38/94]
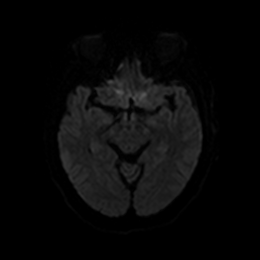
[im 56/94]
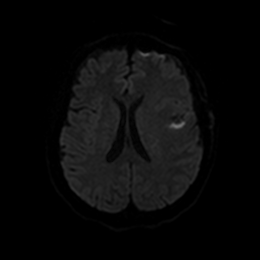
[im 75/94]
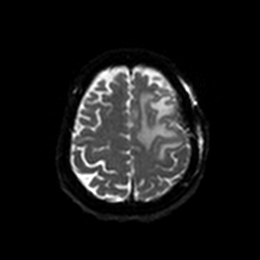
[im 94/94]
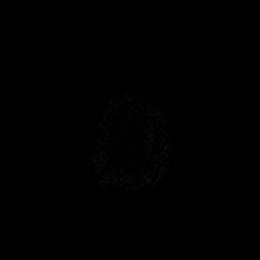

[Series 6: DWI · axial · 3.0mm · 0.88mm/px · z∈[-116,+21]mm · 3 of 47 slices shown (2 of 4)]
[im 1/47]
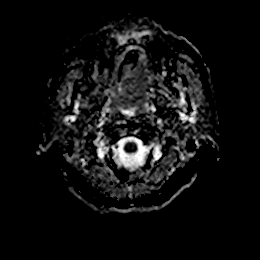
[im 24/47]
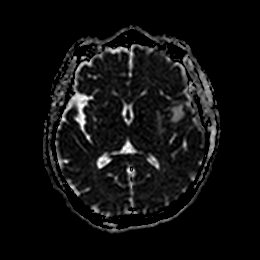
[im 47/47]
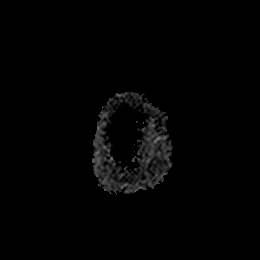

[Series 7: T1 · sagittal · 5.0mm · 0.75mm/px · 2 of 25 slices shown]
[im 1/25]
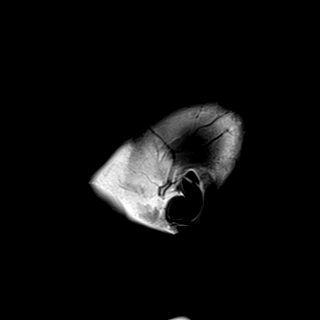
[im 25/25]
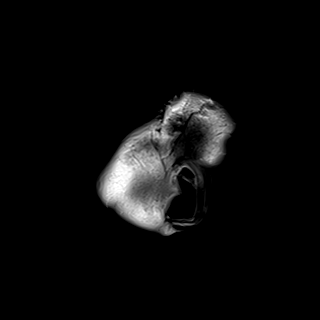

[Series 8: DWI · coronal · 4.0mm · 0.88mm/px · 4 of 64 slices shown (3 of 4)]
[im 1/64]
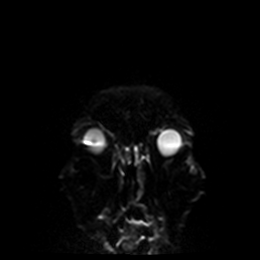
[im 22/64]
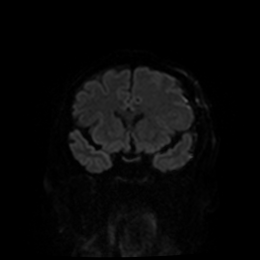
[im 43/64]
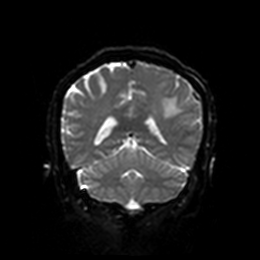
[im 64/64]
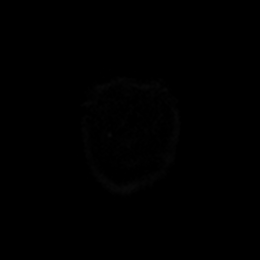

[Series 9: DWI · coronal · 4.0mm · 0.88mm/px · 2 of 32 slices shown (4 of 4)]
[im 1/32]
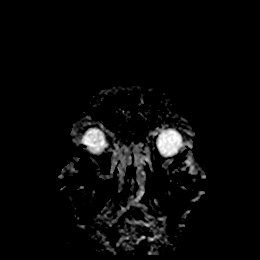
[im 32/32]
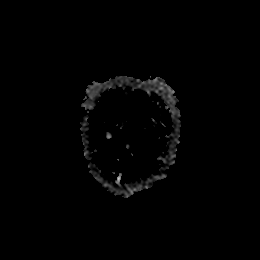

[Series 10: T2 · axial · 5.0mm · 0.72mm/px · z∈[-119,+24]mm · 2 of 25 slices shown (1 of 2)]
[im 1/25]
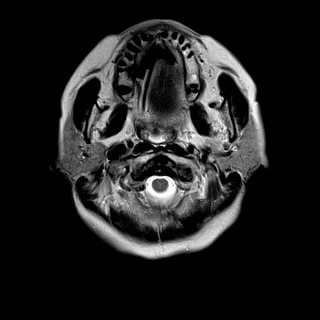
[im 25/25]
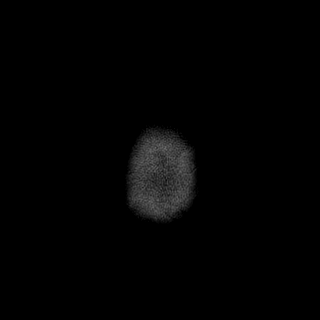

[Series 11: FLAIR · axial · 5.0mm · 0.45mm/px · z∈[-116,+27]mm · 2 of 25 slices shown (1 of 2)]
[im 1/25]
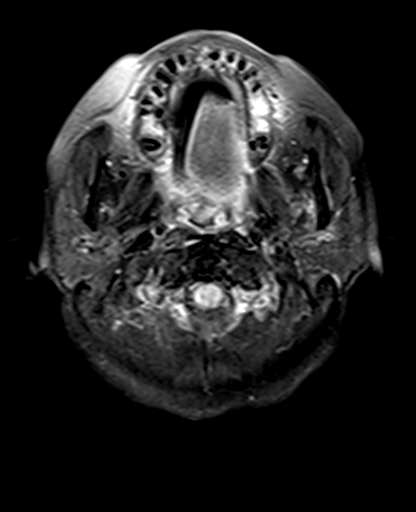
[im 25/25]
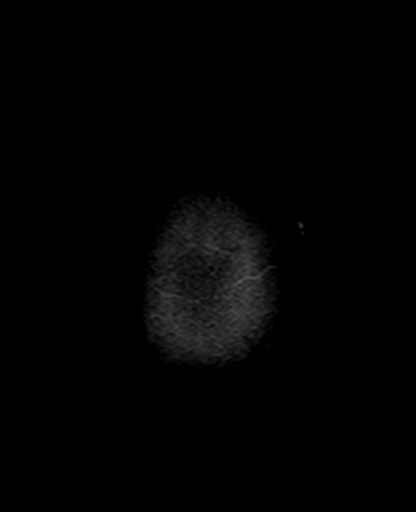

[Series 13: pha_images · axial · 3.0mm · 0.90mm/px · z∈[-132,+31]mm · 4 of 56 slices shown]
[im 1/56]
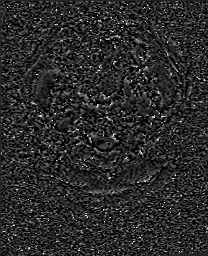
[im 19/56]
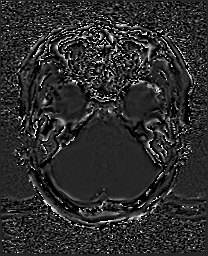
[im 37/56]
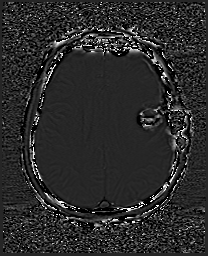
[im 56/56]
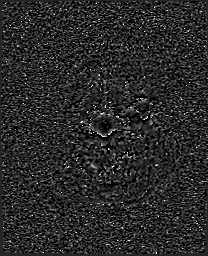

[Series 14: swi_images · axial · 3.0mm · 0.90mm/px · z∈[-132,+43]mm · 4 of 60 slices shown]
[im 1/60]
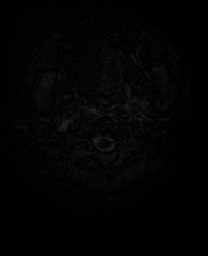
[im 20/60]
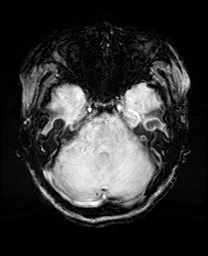
[im 40/60]
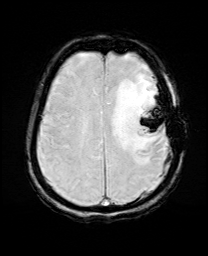
[im 60/60]
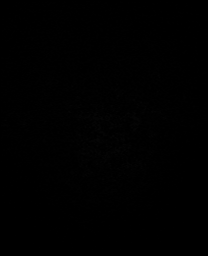

[Series 17: T2 · coronal · 3.0mm · 0.27mm/px · 2 of 32 slices shown (2 of 2)]
[im 1/32]
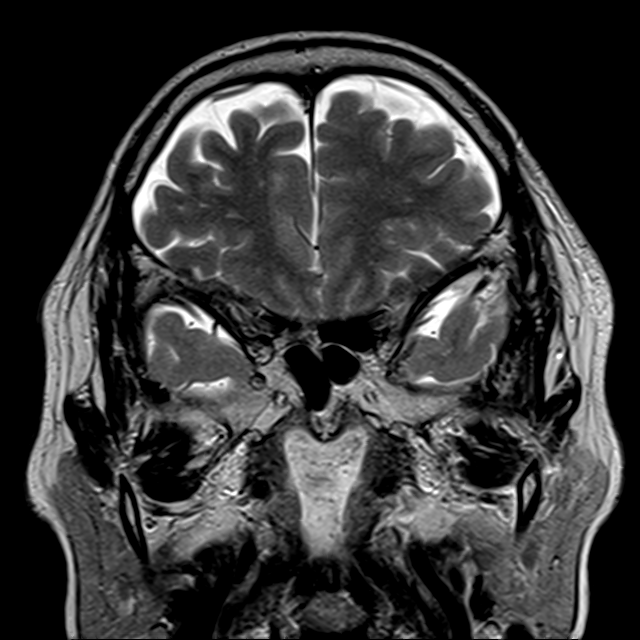
[im 32/32]
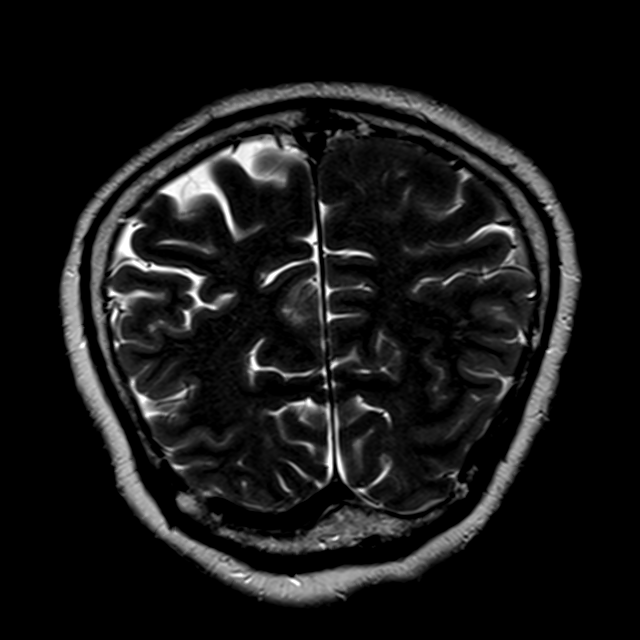

[Series 18: FLAIR · coronal · 3.0mm · 0.56mm/px · 1 of 21 slices shown (2 of 2)]
[im 1/21]
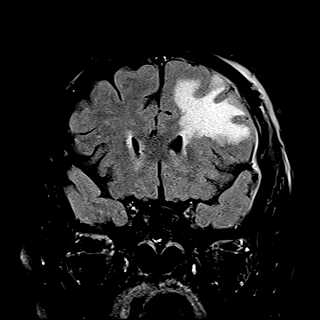

[Series 19: T2 post-contrast · coronal · 5.0mm · 0.72mm/px · 2 of 28 slices shown]
[im 1/28]
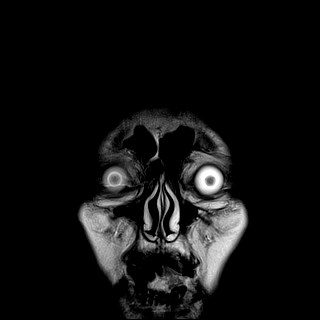
[im 28/28]
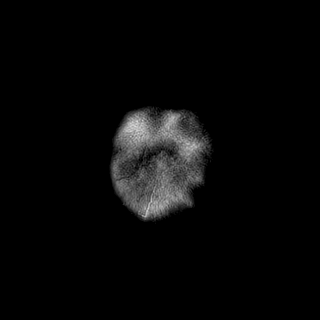

[Series 21: T1 post-contrast · coronal · 5.0mm · 0.34mm/px · 2 of 28 slices shown (1 of 3)]
[im 1/28]
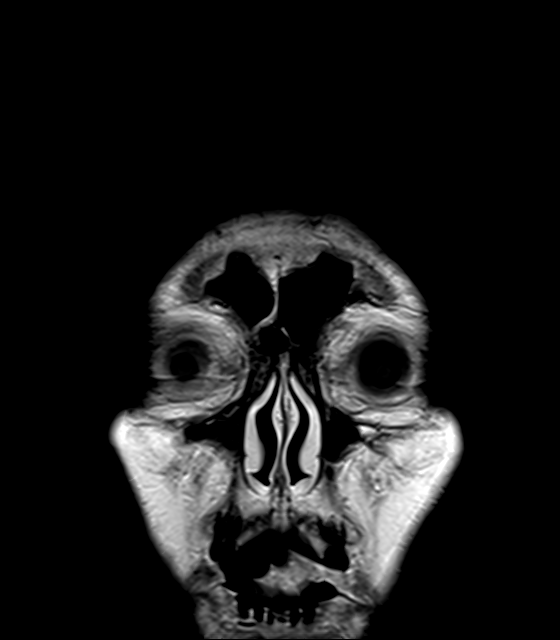
[im 28/28]
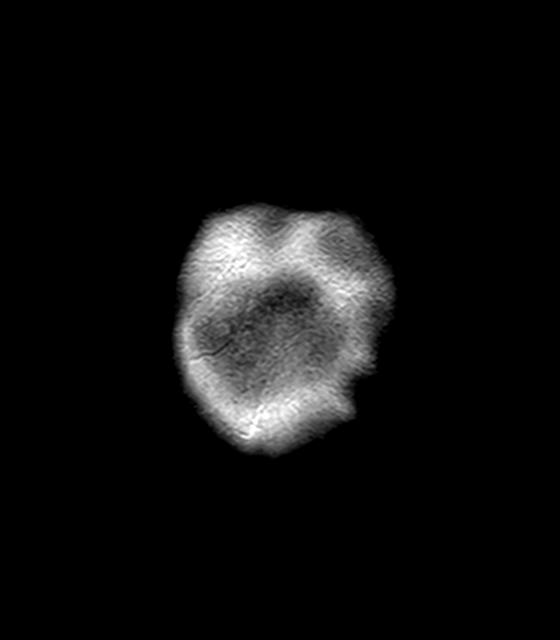

[Series 22: T1 post-contrast · sagittal · 5.0mm · 0.72mm/px · 2 of 25 slices shown (2 of 3)]
[im 1/25]
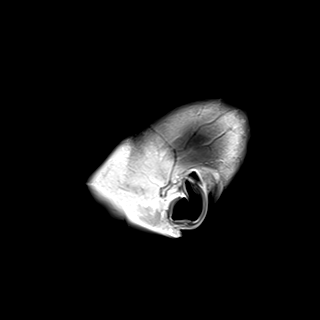
[im 25/25]
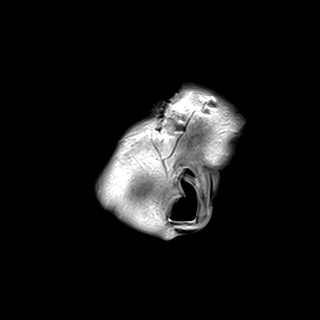

[Series 23: T1 post-contrast · sagittal · 5.0mm · 0.72mm/px · 2 of 25 slices shown (3 of 3)]
[im 1/25]
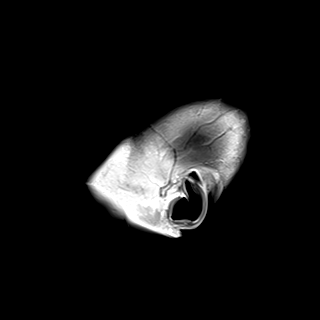
[im 25/25]
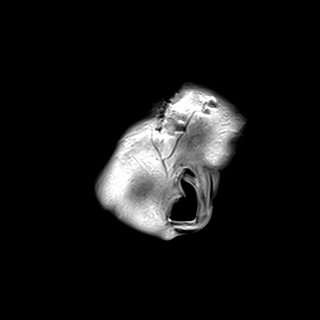

[40 of 48 positions shown; findings below may reference images not displayed]

FINDINGS: Brain: Post resection of left mass with thin extra-axial collection
and resection cavity containing fluid and blood products. There is
reduced diffusion at the deep margin likely reflecting postoperative
contusion. There is no residual nodular enhancement. Associated
edema and regional mass effect are similar.

Vascular: Major vessel flow voids at the skull base are preserved.

Skull and upper cervical spine: Post left frontal craniotomy. Normal
marrow signal is preserved.

Sinuses/Orbits: Paranasal sinuses are aerated. Orbits are
unremarkable.

Other: Sella is unremarkable.  Mastoid air cells are clear.
IMPRESSION: Expected postoperative changes of gross total resection of left
frontal mass. Surrounding edema and mild mass effect are similar.

## 2019-06-30 MED ORDER — GADOBUTROL 1 MMOL/ML IV SOLN
6.0000 mL | Freq: Once | INTRAVENOUS | Status: AC | PRN
  Administered 2019-06-30: 6 mL via INTRAVENOUS

## 2019-06-30 MED ORDER — CHLORHEXIDINE GLUCONATE CLOTH 2 % EX PADS
6.0000 | MEDICATED_PAD | Freq: Every day | CUTANEOUS | Status: DC
  Administered 2019-07-01 – 2019-07-02 (×2): 6 via TOPICAL

## 2019-06-30 MED ORDER — LABETALOL HCL 5 MG/ML IV SOLN: 10.0000 mg | INTRAVENOUS | Status: DC | PRN

## 2019-06-30 MED ORDER — LABETALOL HCL 5 MG/ML IV SOLN
INTRAVENOUS | Status: AC
  Administered 2019-06-30: 08:00:00 10 mg via INTRAVENOUS
  Filled 2019-06-30: qty 4

## 2019-06-30 NOTE — Procedures (Addendum)
Patient Name: Sara Schroeder  MRN: [id number]  Epilepsy Attending: Lora Havens  Referring Physician/Provider:  Dr. 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  EEG evaluate for seizures.  Level of alertness:  Awake  AEDs during EEG study: Keppra  Technical aspects: This EEG study was done with scalp electrodes positioned according to the 10-20 International system of electrode placement. Electrical activity was acquired at a sampling rate of 500Hz  and reviewed with a high frequency filter of 70Hz  and a low frequency filter of 1Hz . EEG data were recorded continuously and digitally stored.   Description:  No clear posterior dominant rhythm was seen.  EEG showed continuous generalized polymorphic 5 to 9 Hz theta-alpha activity.  Hyperventilation and photic stimulation were not performed.  Of note, EEG was technically difficult due to significant eye flutter artifact.  Abnormality -Continuous slow, generalized  IMPRESSION: This technically difficult study suggestive of moderate diffuse encephalopathy, nonspecific etiology. No seizures or definite epileptiform discharges were seen throughout the recording.  Chicquita Mendel [name] [name]

## 2019-06-30 NOTE — Procedures (Signed)
Extubation Procedure Note  Patient Details:   Name: Sara Schroeder DOB: 1950/05/30 MRN: [id number]   Airway Documentation:    Vent end date: 06/30/19 Vent end time: 0855   Evaluation  O2 sats: stable throughout Complications: No apparent complications Patient did tolerate procedure well. Bilateral Breath Sounds: Clear   No   Pt was extubated per MD order to 4L [place] with saturations of 100%. Pt was not able to speak afterwards but did follow some commands. No stridor was noted at this time. Pt seems comfortable with no complaints at this time. RT will continue to monitor pt status.   Aiyden Lauderback A Zachariah Pavek 06/30/2019, 8:59 AM

## 2019-06-30 NOTE — Progress Notes (Signed)
EEG completed, results pending. 

## 2019-06-30 NOTE — Consult Note (Addendum)
[facility]  Telephone:([phone]) [phone] [phone]   MEDICAL ONCOLOGY - INITIAL CONSULTATION  Referral MD: Dr. 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 Neukam is a 69 year old female with a past medical history significant for hypertension and depression.  The patient presented to the emergency room with new onset of seizure.  CT of the head on admission showed a left frontal lobe hemorrhagic mass with surrounding vasogenic edema and a 2 mm of rightward midline shift.  MRI of the brain was performed which showed a solitary 2.6 cm left lateral frontal mass compatible with hemorrhagic neoplasm.  A CT of the chest, abdomen, pelvis without contrast was performed on 06/27/2019 which showed a spiculated right middle lobe pulmonary nodule measuring 11 x 11 x 10 mm.  This had been present on a CT scan performed on 03/01/2018.  There was no evidence of metastatic disease or primary malignancy in the chest, abdomen, pelvis.  Additionally, the patient had a exophytic 35m nodule from the thyroid isthmus.  The patient underwent a left frontal craniotomy for tumor resection on 06/29/2019.  Preliminary pathology shows metastatic adenocarcinoma.  At the time of visit, the patient's son is at the bedside.  The patient is unable to answer questions so her son provides the history.  The patient's son has noticed that the patient has not really been able to talk very well recently.  She attempts to talk, but does not really make sense.  He also reports that she has been having difficulty sleeping recently.  He reports that she has no history of seizures and that her first seizure was when she presented to the emergency room.  He reports that her appetite has been decreased.  He is not sure if she has lost weight.  He reports that she does not eat a lot because she feels it very quickly.  However, she has not reported any abdominal pain, nausea, vomiting.  Additionally, the patient has  not reported any headaches or dizziness.  She has not reported any chest pain, shortness of breath, cough, hemoptysis.  [place]  Her native language [place] she does not speak [place]  [place]  She is married and had 8 children, 5 of which are living.  There is no history of alcohol or tobacco use.  Prior to this admission, the patient was independent with her ADLs.  She would routinely walk in her neighborhood with her husband. Medical oncology was asked see the patient to make recommendations regarding her newly diagnosed metastatic adenocarcinoma.   Past Medical History:  Diagnosis Date  . Anxiety   . Hypertension   :  Past Surgical History:  Procedure Laterality Date  . APPLICATION OF CRANIAL NAVIGATION N/A 06/29/2019   Procedure: APPLICATION OF CRANIAL NAVIGATION;  Surgeon: OJudith Part MD;  [facility]  Service: Neurosurgery;  Laterality: N/A;  . CRANIOTOMY Left 06/29/2019   Procedure: LEFT CRANIOTOMY FOR TUMOR EXCISION;  Surgeon: OJudith Part MD;  [facility] [facility]  Service: Neurosurgery;  Laterality: Left;  :  Current Facility-Administered Medications  Medication Dose Route Frequency Provider Last Rate Last Admin  . acetaminophen (TYLENOL) tablet 650 mg  650 mg Per Tube Q4H PRN MAudria Nine DO      . Chlorhexidine Gluconate Cloth 2 % PADS 6 each  6 each Topical Daily Ashok Pall, MD   6 each at 06/29/19 2146  . dexamethasone (DECADRON) injection 4 mg  4 mg Intravenous Q6H Eustace Moore, MD   4 mg at 06/30/19 0555  . docusate (COLACE) 50 MG/5ML liquid 100 mg  100 mg Per Tube BID Cristal Generous, NP   100 mg at 06/29/19 2135  . fentaNYL (SUBLIMAZE) injection 25-100 mcg  25-100 mcg Intravenous Q30 min PRN Cristal Generous, NP   100 mcg at 06/30/19 0410  . labetalol (NORMODYNE)  injection 10-20 mg  10-20 mg Intravenous Q2H PRN Rigoberto Noel, MD   10 mg at 06/30/19 0827  . lactated ringers infusion   Intravenous Continuous Cristal Generous, NP 50 mL/hr at 06/30/19 1000 Rate Verify at 06/30/19 1000  . levETIRAcetam (KEPPRA) IVPB 1000 mg/100 mL premix  1,000 mg Intravenous Q12H Ashok Pall, MD   Stopped at 06/30/19 [phone]  . LORazepam (ATIVAN) injection 2 mg  2 mg Intravenous Q5 min PRN Cristal Generous, NP   2 mg at 06/27/19 5188  . ondansetron (ZOFRAN) injection 4 mg  4 mg Intravenous Q6H PRN Bowser, Laurel Dimmer, NP      . pantoprazole (PROTONIX) injection 40 mg  40 mg Intravenous QHS Cristal Generous, NP   40 mg at 06/29/19 2135  . polyethylene glycol (MIRALAX / GLYCOLAX) packet 17 g  17 g Per Tube Daily PRN Audria Nine, DO         Allergies  Allergen Reactions  . Lisinopril Other (See Comments)    Per med list from [facility]   :  History reviewed. No pertinent family history.:  Social History   Socioeconomic History  . Marital status: Married    Spouse name: Not on file  . Number of children: Not on file  . Years of education: Not on file  . Highest education level: Not on file  Occupational History  . Not on file  Tobacco Use  . Smoking status: Never Smoker  . Smokeless tobacco: Never Used  Substance and Sexual Activity  . Alcohol use: Never  . Drug use: Never  . Sexual activity: Not Currently  Other Topics Concern  . Not on file  Social History Narrative  . Not on file   Social Determinants of Health   Financial Resource Strain:   . Difficulty of Paying Living Expenses:   Food Insecurity:   . Worried About Charity fundraiser in the Last Year:   . Arboriculturist in the Last Year:   Transportation Needs:   . Film/video editor (Medical):   Marland Kitchen Lack of Transportation (Non-Medical):   Physical Activity:   . Days of Exercise per Week:   . Minutes of Exercise per Session:   Stress:   . Feeling of Stress :    Social Connections:   . Frequency of Communication with Friends and Family:   . Frequency of Social Gatherings with Friends and Family:   . Attends Religious Services:   . Active Member of Clubs or Organizations:   . Attends Archivist Meetings:   Marland Kitchen Marital Status:   Intimate Partner Violence:   . Fear of Current or Ex-Partner:   . Emotionally Abused:   Marland Kitchen Physically Abused:   . Sexually Abused:   :  Review of Systems: Review of systems obtained from her son and noted in the HPI.  Exam: Patient Vitals for the past 24 hrs:  BP  Temp Temp src Pulse Resp SpO2 Weight  06/30/19 1200 -- 99.8 F (37.7 C) Oral -- -- -- --  06/30/19 1000 (!) 147/68 -- -- 91 (!) 21 100 % --  06/30/19 0900 (!) 166/85 -- -- (!) 102 (!) 25 100 % --  06/30/19 0859 -- -- -- -- -- 100 % --  06/30/19 0828 (!) 165/85 -- -- 99 17 100 % --  06/30/19 0808 (!) 171/105 -- -- (!) 123 (!) 22 100 % --  06/30/19 0800 (!) 171/105 98.2 F (36.8 C) Axillary (!) 124 20 100 % --  06/30/19 0700 123/69 -- -- 78 20 99 % --  06/30/19 0600 138/69 -- -- 87 20 100 % --  06/30/19 0527 (!) 147/71 -- -- 90 20 100 % --  06/30/19 0500 -- -- -- -- -- -- 56.8 kg  06/30/19 0400 137/76 99.6 F (37.6 C) Oral 86 20 99 % --  06/30/19 0300 135/86 -- -- 87 20 98 % --  06/30/19 0200 (!) 153/83 -- -- 87 20 100 % --  06/30/19 0100 132/68 -- -- 79 20 99 % --  06/30/19 0000 (!) 144/74 98.2 F (36.8 C) Oral 87 20 100 % --  06/29/19 2324 (!) 141/71 -- -- 81 20 100 % --  06/29/19 2300 (!) 141/71 -- -- 76 20 100 % --  06/29/19 2200 124/62 -- -- 81 20 100 % --  06/29/19 2100 (!) 163/79 99.9 F (37.7 C) -- 95 (!) 21 100 % --  06/29/19 2000 (!) 152/79 99.5 F (37.5 C) Bladder 72 20 100 % --  06/29/19 1957 (!) 145/79 -- -- 71 20 100 % --  06/29/19 1900 (!) 145/79 99.1 F (37.3 C) -- 71 20 100 % --  06/29/19 1800 (!) 146/80 98.8 F (37.1 C) -- 72 20 100 % --  06/29/19 1700 (!) 145/73 98.4 F (36.9 C) -- 72 20 100 % --  06/29/19 1600  128/73 98.2 F (36.8 C) -- 73 20 100 % --  06/29/19 1516 121/67 -- -- 69 20 99 % --  06/29/19 1500 121/67 97.7 F (36.5 C) -- 69 20 99 % --    General: Opens eyes to voice, follows commands.   Eyes: PERRL, no scleral icterus.   ENT:  There were no oropharyngeal lesions.    Lymphatics:  Negative cervical, supraclavicular or axillary adenopathy.   Respiratory: lungs were clear bilaterally without wheezing or crackles.   Cardiovascular:  Regular rate and rhythm, S1/S2, without murmur, rub or gallop.  There was no pedal edema.   GI:  abdomen was soft, flat, nontender, nondistended, without organomegaly.     Skin exam was without echymosis, petichae.   Neuro exam was nonfocal.  The patient is alert and able to follow commands.   Lab Results  Component Value Date   WBC 19.3 (H) 06/30/2019   HGB 10.7 (L) 06/30/2019   HCT 32.9 (L) 06/30/2019   PLT 165 06/30/2019   GLUCOSE 144 (H) 06/30/2019   TRIG 179 (H) 06/30/2019   ALT 17 06/27/2019   AST 26 06/27/2019   NA 140 06/30/2019   K 3.6 06/30/2019   CL 104 06/30/2019   CREATININE 0.71 06/30/2019   BUN 22 06/30/2019   CO2 23 06/30/2019    EEG  Result Date: 06/27/2019 Lora Havens, MD     06/27/2019  9:56 AM Patient Name: Grizelda Piscopo MRN: [id number] Epilepsy Attending: Lora Havens Referring Physician/Provider: Eliseo Gum, NP Date: 06/27/2019 Duration: 28.21  minutes Patient history: 69 year old female presented with seizure-like episode and was found to have left frontal lobe hemorrhagic mass.  EEG evaluate for seizures. Level of alertness: Comatose AEDs during EEG study: Keppra, propofol Technical aspects: This EEG study was done with scalp electrodes positioned according to the 10-20 International system of electrode placement. Electrical activity was acquired at a sampling rate of 500Hz  and reviewed with a high frequency filter of 70Hz  and a low frequency filter of 1Hz . EEG data were recorded continuously and digitally stored.  Description: EEG showed an excessive amount of 15 to 18 Hz, sharply contoured beta activity with irregular morphology distributed symmetrically and diffusely.   Intermittent generalized background attenuation was also noted. Hyperventilation and photic stimulation were not performed. Abnormality -Excessive beta, generalized -Background attenuation, generalized IMPRESSION: This study is suggestive of severe to profound diffuse encephalopathy, nonspecific etiology but most likely secondary to sedation. No seizures or definite epileptiform discharges were seen throughout the recording. Priyanka [name] [name]   CT ABDOMEN PELVIS WO CONTRAST  Result Date: 06/27/2019 CLINICAL DATA:  Brain mass. Lung nodule. EXAM: CT CHEST, ABDOMEN AND PELVIS WITHOUT CONTRAST TECHNIQUE: Multidetector CT imaging of the chest, abdomen and pelvis was performed following the standard protocol without IV contrast. COMPARISON:  Chest radiograph yesterday. CT chest abdomen pelvis 03/01/2018 FINDINGS: CT CHEST FINDINGS Cardiovascular: Aortic atherosclerosis. No aortic aneurysm. Heart is normal in size. No pericardial effusion. Mediastinum/Nodes: Accurate assessment for hilar adenopathy is limited in the absence of IV contrast. There are no enlarged mediastinal lymph nodes. Endotracheal tube with tip above the carina. Enteric tube decompresses the esophagus. There is a 16 mm nodule that abuts the isthmus of the thyroid, likely unchanged from prior, on the previous exam portions of this region obscured by streak artifact from IV contrast. There is no axillary adenopathy. Lungs/Pleura: Spiculated right middle lobe pulmonary nodule measures 11 x 11 x 10 mm, only minimally increased in size from prior exam. Margins are irregular and spiculated with small spiculation extending to the pleural surface anteriorly, minimal pleural thickening. No other pulmonary nodule or mass. Dependent opacities in the lung bases favor atelectasis. Trace pleural thickening  without significant effusion. Musculoskeletal: No blastic or evidence of destructive lytic lesion. Ordinary degenerative change in the spine. No obvious breast mass. CT ABDOMEN PELVIS FINDINGS Hepatobiliary: Lack of IV contrast limits assessment for focal liver lesion. Capsular calcification involving the dome. No evidence of focal hepatic lesion. Gallbladder physiologically distended, no calcified stone. No biliary dilatation. Pancreas: No ductal dilatation or inflammation. No evidence of focal lesion on this noncontrast exam. Spleen: Normal in size without focal abnormality. Adrenals/Urinary Tract: Slight adrenal thickening without renal nodule. No hydronephrosis. Mild prominence of both renal collecting systems. No evidence of focal renal lesion on noncontrast exam. Urinary bladder is decompressed by Foley catheter. Stomach/Bowel: Enteric tube tip in the stomach. Contrast and fluid in the stomach without evidence of focal gastric mass. No small bowel wall thickening or inflammation. No obstruction, administered enteric contrast reaches the colon. Normal contrast filled appendix. Moderate stool distally. Vascular/Lymphatic: Aortic atherosclerosis. No aortic aneurysm. No enlarged lymph nodes in the abdomen or pelvis, detailed assessment limited in the absence of IV contrast. Small calcified lymph node adjacent to the proximal sigmoid colon, suggesting prior granulomatous disease. Reproductive: Uterus and bilateral adnexa are unremarkable. No evidence of adnexal mass. Other: No ascites or free fluid. No omental thickening. Musculoskeletal: No blastic or destructive lytic lesions. Transitional lumbosacral anatomy. Degenerative disc disease at L4-L5. There are no acute or suspicious  osseous abnormalities. Calcified granuloma in the subcutaneous soft tissues. No suspicious skin or subcutaneous lesion. IMPRESSION: 1. Spiculated right middle lobe pulmonary nodule measuring 11 x 11 x 10 mm, only minimally increased in  size from prior exam. This is suspicious for primary bronchogenic malignancy. Evaluation of the hila is limited in the absence of IV contrast, allowing for this limitation, no evidence of nodal metastasis. 2. No other evidence of metastatic disease or primary malignancy in the chest, abdomen, or pelvis. 3. Exophytic 16 mm nodule from the thyroid isthmus, patient had previous thyroid uptake and scan at an outside institution that reported this nodule, however size was not specified, and those images are not available. No thyroid ultrasounds are available for review. Therefore, recommend thyroid US (ref: J Am Coll Radiol. 2015 Feb;12(2): 143-50), unless performed elsewhere. 4. Dependent opacities in the lung bases favor atelectasis. Aortic Atherosclerosis (ICD10-I70.0). Electronically Signed   By: Keith Rake M.D.   On: 06/27/2019 16:56   CT Head Wo Contrast  Result Date: 06/26/2019 CLINICAL DATA:  Seizure EXAM: CT HEAD WITHOUT CONTRAST TECHNIQUE: Contiguous axial images were obtained from the base of the skull through the vertex without intravenous contrast. COMPARISON:  None. FINDINGS: Brain: There is hemorrhage in the left frontal lobe measuring 2.7 x 2.0 x 2.8 cm with a large amount surrounding vasogenic edema. This is likely a mass. Brain parenchyma is otherwise normal. There is no herniation. 2 mm of rightward midline shift. Vascular: Negative Skull: Normal Sinuses/Orbits: Clear sinuses. Normal orbits. Other: None IMPRESSION: Left frontal lobe hemorrhagic mass with surrounding vasogenic edema and 2 mm of rightward midline shift. MRI with and without contrast is recommended for further characterization. Critical Value/emergent results were called by telephone at the time of interpretation on 06/26/2019 at 8:39 pm to provider [facility] , who verbally acknowledged these results. Electronically Signed   By: Ulyses Jarred M.D.   On: 06/26/2019 20:39   CT CHEST WO CONTRAST  Result Date:  06/27/2019 CLINICAL DATA:  Brain mass. Lung nodule. EXAM: CT CHEST, ABDOMEN AND PELVIS WITHOUT CONTRAST TECHNIQUE: Multidetector CT imaging of the chest, abdomen and pelvis was performed following the standard protocol without IV contrast. COMPARISON:  Chest radiograph yesterday. CT chest abdomen pelvis 03/01/2018 FINDINGS: CT CHEST FINDINGS Cardiovascular: Aortic atherosclerosis. No aortic aneurysm. Heart is normal in size. No pericardial effusion. Mediastinum/Nodes: Accurate assessment for hilar adenopathy is limited in the absence of IV contrast. There are no enlarged mediastinal lymph nodes. Endotracheal tube with tip above the carina. Enteric tube decompresses the esophagus. There is a 16 mm nodule that abuts the isthmus of the thyroid, likely unchanged from prior, on the previous exam portions of this region obscured by streak artifact from IV contrast. There is no axillary adenopathy. Lungs/Pleura: Spiculated right middle lobe pulmonary nodule measures 11 x 11 x 10 mm, only minimally increased in size from prior exam. Margins are irregular and spiculated with small spiculation extending to the pleural surface anteriorly, minimal pleural thickening. No other pulmonary nodule or mass. Dependent opacities in the lung bases favor atelectasis. Trace pleural thickening without significant effusion. Musculoskeletal: No blastic or evidence of destructive lytic lesion. Ordinary degenerative change in the spine. No obvious breast mass. CT ABDOMEN PELVIS FINDINGS Hepatobiliary: Lack of IV contrast limits assessment for focal liver lesion. Capsular calcification involving the dome. No evidence of focal hepatic lesion. Gallbladder physiologically distended, no calcified stone. No biliary dilatation. Pancreas: No ductal dilatation or inflammation. No evidence of focal lesion on this noncontrast exam.  Spleen: Normal in size without focal abnormality. Adrenals/Urinary Tract: Slight adrenal thickening without renal nodule. No  hydronephrosis. Mild prominence of both renal collecting systems. No evidence of focal renal lesion on noncontrast exam. Urinary bladder is decompressed by Foley catheter. Stomach/Bowel: Enteric tube tip in the stomach. Contrast and fluid in the stomach without evidence of focal gastric mass. No small bowel wall thickening or inflammation. No obstruction, administered enteric contrast reaches the colon. Normal contrast filled appendix. Moderate stool distally. Vascular/Lymphatic: Aortic atherosclerosis. No aortic aneurysm. No enlarged lymph nodes in the abdomen or pelvis, detailed assessment limited in the absence of IV contrast. Small calcified lymph node adjacent to the proximal sigmoid colon, suggesting prior granulomatous disease. Reproductive: Uterus and bilateral adnexa are unremarkable. No evidence of adnexal mass. Other: No ascites or free fluid. No omental thickening. Musculoskeletal: No blastic or destructive lytic lesions. Transitional lumbosacral anatomy. Degenerative disc disease at L4-L5. There are no acute or suspicious osseous abnormalities. Calcified granuloma in the subcutaneous soft tissues. No suspicious skin or subcutaneous lesion. IMPRESSION: 1. Spiculated right middle lobe pulmonary nodule measuring 11 x 11 x 10 mm, only minimally increased in size from prior exam. This is suspicious for primary bronchogenic malignancy. Evaluation of the hila is limited in the absence of IV contrast, allowing for this limitation, no evidence of nodal metastasis. 2. No other evidence of metastatic disease or primary malignancy in the chest, abdomen, or pelvis. 3. Exophytic 16 mm nodule from the thyroid isthmus, patient had previous thyroid uptake and scan at an outside institution that reported this nodule, however size was not specified, and those images are not available. No thyroid ultrasounds are available for review. Therefore, recommend thyroid US (ref: J Am Coll Radiol. 2015 Feb;12(2): 143-50), unless  performed elsewhere. 4. Dependent opacities in the lung bases favor atelectasis. Aortic Atherosclerosis (ICD10-I70.0). Electronically Signed   By: Keith Rake M.D.   On: 06/27/2019 16:56   MR BRAIN W WO CONTRAST  Result Date: 06/30/2019 CLINICAL DATA:  Post mass resection EXAM: MRI HEAD WITHOUT AND WITH CONTRAST TECHNIQUE: Multiplanar, multiecho pulse sequences of the brain and surrounding structures were obtained without and with intravenous contrast. CONTRAST:  68m GADAVIST GADOBUTROL 1 MMOL/ML IV SOLN COMPARISON:  06/27/2019 FINDINGS: Brain: Post resection of left mass with thin extra-axial collection and resection cavity containing fluid and blood products. There is reduced diffusion at the deep margin likely reflecting postoperative contusion. There is no residual nodular enhancement. Associated edema and regional mass effect are similar. Vascular: Major vessel flow voids at the skull base are preserved. Skull and upper cervical spine: Post left frontal craniotomy. Normal marrow signal is preserved. Sinuses/Orbits: Paranasal sinuses are aerated. Orbits are unremarkable. Other: Sella is unremarkable.  Mastoid air cells are clear. IMPRESSION: Expected postoperative changes of gross total resection of left frontal mass. Surrounding edema and mild mass effect are similar. Electronically Signed   By: PMacy MisM.D.   On: 06/30/2019 08:08   MR BRAIN W WO CONTRAST  Result Date: 06/27/2019 CLINICAL DATA:  Brain mass.  History of lung nodule. EXAM: MRI HEAD WITHOUT AND WITH CONTRAST TECHNIQUE: Multiplanar, multiecho pulse sequences of the brain and surrounding structures were obtained without and with intravenous contrast. CONTRAST:  5.518mGADAVIST GADOBUTROL 1 MMOL/ML IV SOLN COMPARISON:  CT head 06/26/2019. MRI head without with contrast 06/27/2019 FINDINGS: Brain: SRS protocol with thin sections performed on 3 tesla MRI. Solitary mass left frontal lobe with evidence of internal hemorrhage. The mass  measures 2.2 x 2.3 x  2.8 cm. There is extensive vasogenic edema in the white matter. This is causing mass-effect and mild midline shift to the right of approximately 2 mm. Ventricle size normal. No second lesion identified. Mild chronic microvascular ischemic change in the white matter. Negative for acute infarct. Vascular: Normal arterial flow voids Skull and upper cervical spine: No focal skeletal lesion. Sinuses/Orbits: Mild mucosal edema paranasal sinuses. Bilateral orbits Other: None IMPRESSION: Solitary hemorrhagic mass left frontal lobe measuring 2.2 x 2.3 x 2.8 cm. Extensive vasogenic edema. 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 Primary brain tumor not likely. Electronically Signed   By: Franchot Gallo M.D.   On: 06/27/2019 17:43   MR BRAIN W WO CONTRAST  Result Date: 06/27/2019 CLINICAL DATA:  Mass follow-up EXAM: MRI HEAD WITHOUT AND WITH CONTRAST TECHNIQUE: Multiplanar, multiecho pulse sequences of the brain and surrounding structures were obtained without and with intravenous contrast. CONTRAST:  51m GADAVIST GADOBUTROL 1 MMOL/ML IV SOLN COMPARISON:  Head CT from yesterday FINDINGS: Brain: 2.6 cm left lateral frontal mass which appears intra-axial. The mass is T2 hypointense and mildly dense by CT. There is patchy T1 shortening compatible with blood products based on gradient imaging. There is prominent adjacent vasogenic edema. The mass is diffusely enhancing and not a simple hematoma. No second mass is seen.  No infarct, hydrocephalus, or collection. Vascular: Normal flow voids and vascular enhancements Skull and upper cervical spine: Normal marrow signal Sinuses/Orbits: Negative IMPRESSION: 1. 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 There was a worrisome pulmonary nodule on a January 2020 chest CT, updated chest CT may be contributory. 2. Extensive vasogenic edema. Electronically Signed   By: JMonte FantasiaM.D.   On: 06/27/2019 08:12   DG  CHEST PORT 1 VIEW  Result Date: 06/28/2019 CLINICAL DATA:  Acute hypoxemic respiratory failure EXAM: PORTABLE CHEST 1 VIEW COMPARISON:  CT 06/27/2019 FINDINGS: *Endotracheal tube in the mid trachea 3 cm from the carina. *Transesophageal tube tip and side port distal to the GE junction curling in the left upper quadrant. *Telemetry leads and support devices overlie the chest. Redemonstration of a right middle lobe pulmonary nodule better seen on comparison CT. Mild basilar atelectatic changes. No acute consolidative opacity or convincing features of edema. No pneumothorax or effusion. Cardiomediastinal contours are stable. No acute osseous or soft tissue abnormality. Degenerative changes are present in the imaged spine and shoulders. IMPRESSION: 1. Mild basilar atelectatic changes. 2. Redemonstration of right middle lobe pulmonary nodule better seen on comparison CT. 3. Lines and tubes as above. Electronically Signed   By: PLovena LeM.D.   On: 06/28/2019 06:27   DG Chest Port 1 View  Result Date: 06/27/2019 CLINICAL DATA:  Intubation. EXAM: PORTABLE CHEST 1 VIEW COMPARISON:  Chest x-ray 06/26/2019, 03/01/2018. FINDINGS: Endotracheal tube, NG tube in good anatomic position. NG tube has been advanced its tip and side hole in the stomach. Heart size normal. As previously noted on prior study of 06/26/2018 right lower lobe nodule appears larger. Again further evaluation with chest CT is suggested. Mild left base subsegmental atelectasis. No pleural effusion or pneumothorax. IMPRESSION: 1. Lines and tubes in good anatomic position. NG tube has been advanced its tip and side hole are within the stomach. 2. As noted on prior study of 06/26/2018 right lower lobe pulmonary nodule appears larger. Again further evaluation with chest CT suggested. These results will be called to the ordering clinician or representative by the Radiologist Assistant, and communication documented in the  PACS or [employer].  Electronically Signed   By: Marcello Moores  Register   On: 06/27/2019 08:42   DG Chest Portable 1 View  Result Date: 06/26/2019 CLINICAL DATA:  69 year old female status post intubation. EXAM: PORTABLE CHEST 1 VIEW COMPARISON:  Earlier radiograph dated 06/26/2019. FINDINGS: Endotracheal tube with tip approximately 18 mm above the carina. Recommend retraction by 2-3 cm for optimal positioning. Enteric tube with side port above the GE junction. Recommend further advancing by additional 8 cm. No other interval change. IMPRESSION: 1. Endotracheal tube above the carina. 2. Enteric tube with side port above the GE junction. Recommend further advancing by additional 8 cm. Electronically Signed   By: Anner Crete M.D.   On: 06/26/2019 21:27   DG Chest Portable 1 View  Result Date: 06/26/2019 CLINICAL DATA:  Fever EXAM: PORTABLE CHEST 1 VIEW COMPARISON:  03/01/2018 FINDINGS: The heart size and mediastinal contours are within normal limits. Right lower lung nodule may be slightly increased, now measuring 14 mm. The visualized skeletal structures are unremarkable. IMPRESSION: No active disease. Suspected slight increase in size of right lower lung nodule. Follow-up chest CT is recommended. Electronically Signed   By: Donavan Foil M.D.   On: 06/26/2019 20:48   EEG adult  Result Date: 06/30/2019 Lora Havens, MD     06/30/2019 10:20 AM Patient Name: Laelle Bridgett MRN: [id number] Epilepsy Attending: Lora Havens Referring Physician/Provider:  Dr. Doreene Nest Date: 06/30/2019 Duration: 24.07 minutes  Patient history: 69 year old female presented with seizure-like episode and was found to have left frontal lobe hemorrhagic mass.  EEG evaluate for seizures.  Level of alertness:  Awake  AEDs during EEG study: Keppra  Technical aspects: This EEG study was done with scalp electrodes positioned according to the 10-20 International system of electrode placement. Electrical activity was acquired at a sampling rate of 500Hz  and reviewed  with a high frequency filter of 70Hz  and a low frequency filter of 1Hz . EEG data were recorded continuously and digitally stored.  Description:  No clear posterior dominant rhythm was seen.  EEG showed continuous generalized polymorphic 5 to 9 Hz theta-alpha activity.  Hyperventilation and photic stimulation were not performed. Of note, EEG was technically difficult due to significant eye flutter artifact.  Abnormality -Continuous slow, generalized  IMPRESSION: This technically difficult study suggestive of moderate diffuse encephalopathy, nonspecific etiology. No seizures or definite epileptiform discharges were seen throughout the recording. Lora Havens   US THYROID  Result Date: 06/28/2019 CLINICAL DATA:  Incidental on CT. Nodule of thyroid isthmus by CT of the chest. EXAM: THYROID ULTRASOUND TECHNIQUE: Ultrasound examination of the thyroid gland and adjacent soft tissues was performed. COMPARISON:  CT of the chest on 06/27/2019 FINDINGS: Parenchymal Echotexture: Mildly heterogenous Isthmus: 1.0 cm Right lobe: 4.4 x 1.7 x 1.2 cm Left lobe: 4.3 x 1.9 x 1.3 cm _________________________________________________________ Estimated total number of nodules >/= 1 cm: 1 Number of spongiform nodules >/=  2 cm not described below (TR1): 0 Number of mixed cystic and solid nodules >/= 1.5 cm not described below (TR2): 0 _________________________________________________________ Nodule # 1: Location: Isthmus Maximum size: 1.6 cm; Other 2 dimensions: 1.4 x 1.1 cm Composition: solid/almost completely solid (2) Echogenicity: hypoechoic (2) Shape: not taller-than-wide (0) Margins: ill-defined (0) Echogenic foci: none (0) ACR TI-RADS total points: 4. ACR TI-RADS risk category: TR4 (4-6 points). ACR TI-RADS recommendations: **Given size (>/= 1.5 cm) and appearance, fine needle aspiration of this moderately suspicious nodule should be considered based on TI-RADS criteria.  _________________________________________________________  Simple cyst in the inferior left lobe measures 0.9 cm in greatest diameter and has a benign appearance. No abnormal lymph nodes identified IMPRESSION: 1.6 cm isthmus nodule technically meets criteria for fine-needle aspiration. The nodule is borderline in size for biopsy and 1 year follow-up ultrasound could also be considered. The above is in keeping with the ACR TI-RADS recommendations - J Am Coll Radiol 2017;14:587-595. Electronically Signed   By: Aletta Edouard M.D.   On: 06/28/2019 15:41     EEG  Result Date: 06/27/2019 Lora Havens, MD     06/27/2019  9:56 AM Patient Name: Eleonore Shippee MRN: [id number] Epilepsy Attending: Lora Havens Referring Physician/Provider: Eliseo Gum, NP Date: 06/27/2019 Duration: 28.21 minutes Patient history: 69 year old female presented with seizure-like episode and was found to have left frontal lobe hemorrhagic mass.  EEG evaluate for seizures. Level of alertness: Comatose AEDs during EEG study: Keppra, propofol Technical aspects: This EEG study was done with scalp electrodes positioned according to the 10-20 International system of electrode placement. Electrical activity was acquired at a sampling rate of 500Hz  and reviewed with a high frequency filter of 70Hz  and a low frequency filter of 1Hz . EEG data were recorded continuously and digitally stored. Description: EEG showed an excessive amount of 15 to 18 Hz, sharply contoured beta activity with irregular morphology distributed symmetrically and diffusely.   Intermittent generalized background attenuation was also noted. Hyperventilation and photic stimulation were not performed. Abnormality -Excessive beta, generalized -Background attenuation, generalized IMPRESSION: This study is suggestive of severe to profound diffuse encephalopathy, nonspecific etiology but most likely secondary to sedation. No seizures or definite epileptiform discharges were seen throughout  the recording. Priyanka [name] [name]   CT ABDOMEN PELVIS WO CONTRAST  Result Date: 06/27/2019 CLINICAL DATA:  Brain mass. Lung nodule. EXAM: CT CHEST, ABDOMEN AND PELVIS WITHOUT CONTRAST TECHNIQUE: Multidetector CT imaging of the chest, abdomen and pelvis was performed following the standard protocol without IV contrast. COMPARISON:  Chest radiograph yesterday. CT chest abdomen pelvis 03/01/2018 FINDINGS: CT CHEST FINDINGS Cardiovascular: Aortic atherosclerosis. No aortic aneurysm. Heart is normal in size. No pericardial effusion. Mediastinum/Nodes: Accurate assessment for hilar adenopathy is limited in the absence of IV contrast. There are no enlarged mediastinal lymph nodes. Endotracheal tube with tip above the carina. Enteric tube decompresses the esophagus. There is a 16 mm nodule that abuts the isthmus of the thyroid, likely unchanged from prior, on the previous exam portions of this region obscured by streak artifact from IV contrast. There is no axillary adenopathy. Lungs/Pleura: Spiculated right middle lobe pulmonary nodule measures 11 x 11 x 10 mm, only minimally increased in size from prior exam. Margins are irregular and spiculated with small spiculation extending to the pleural surface anteriorly, minimal pleural thickening. No other pulmonary nodule or mass. Dependent opacities in the lung bases favor atelectasis. Trace pleural thickening without significant effusion. Musculoskeletal: No blastic or evidence of destructive lytic lesion. Ordinary degenerative change in the spine. No obvious breast mass. CT ABDOMEN PELVIS FINDINGS Hepatobiliary: Lack of IV contrast limits assessment for focal liver lesion. Capsular calcification involving the dome. No evidence of focal hepatic lesion. Gallbladder physiologically distended, no calcified stone. No biliary dilatation. Pancreas: No ductal dilatation or inflammation. No evidence of focal lesion on this noncontrast exam. Spleen: Normal in size without focal  abnormality. Adrenals/Urinary Tract: Slight adrenal thickening without renal nodule. No hydronephrosis. Mild prominence of both renal collecting systems. No evidence of focal renal lesion on noncontrast exam. Urinary bladder is decompressed by Foley  catheter. Stomach/Bowel: Enteric tube tip in the stomach. Contrast and fluid in the stomach without evidence of focal gastric mass. No small bowel wall thickening or inflammation. No obstruction, administered enteric contrast reaches the colon. Normal contrast filled appendix. Moderate stool distally. Vascular/Lymphatic: Aortic atherosclerosis. No aortic aneurysm. No enlarged lymph nodes in the abdomen or pelvis, detailed assessment limited in the absence of IV contrast. Small calcified lymph node adjacent to the proximal sigmoid colon, suggesting prior granulomatous disease. Reproductive: Uterus and bilateral adnexa are unremarkable. No evidence of adnexal mass. Other: No ascites or free fluid. No omental thickening. Musculoskeletal: No blastic or destructive lytic lesions. Transitional lumbosacral anatomy. Degenerative disc disease at L4-L5. There are no acute or suspicious osseous abnormalities. Calcified granuloma in the subcutaneous soft tissues. No suspicious skin or subcutaneous lesion. IMPRESSION: 1. Spiculated right middle lobe pulmonary nodule measuring 11 x 11 x 10 mm, only minimally increased in size from prior exam. This is suspicious for primary bronchogenic malignancy. Evaluation of the hila is limited in the absence of IV contrast, allowing for this limitation, no evidence of nodal metastasis. 2. No other evidence of metastatic disease or primary malignancy in the chest, abdomen, or pelvis. 3. Exophytic 16 mm nodule from the thyroid isthmus, patient had previous thyroid uptake and scan at an outside institution that reported this nodule, however size was not specified, and those images are not available. No thyroid ultrasounds are available for review.  Therefore, recommend thyroid US (ref: J Am Coll Radiol. 2015 Feb;12(2): 143-50), unless performed elsewhere. 4. Dependent opacities in the lung bases favor atelectasis. Aortic Atherosclerosis (ICD10-I70.0). Electronically Signed   By: Keith Rake M.D.   On: 06/27/2019 16:56   CT Head Wo Contrast  Result Date: 06/26/2019 CLINICAL DATA:  Seizure EXAM: CT HEAD WITHOUT CONTRAST TECHNIQUE: Contiguous axial images were obtained from the base of the skull through the vertex without intravenous contrast. COMPARISON:  None. FINDINGS: Brain: There is hemorrhage in the left frontal lobe measuring 2.7 x 2.0 x 2.8 cm with a large amount surrounding vasogenic edema. This is likely a mass. Brain parenchyma is otherwise normal. There is no herniation. 2 mm of rightward midline shift. Vascular: Negative Skull: Normal Sinuses/Orbits: Clear sinuses. Normal orbits. Other: None IMPRESSION: Left frontal lobe hemorrhagic mass with surrounding vasogenic edema and 2 mm of rightward midline shift. MRI with and without contrast is recommended for further characterization. Critical Value/emergent results were called by telephone at the time of interpretation on 06/26/2019 at 8:39 pm to provider [facility] , who verbally acknowledged these results. Electronically Signed   By: Ulyses Jarred M.D.   On: 06/26/2019 20:39   CT CHEST WO CONTRAST  Result Date: 06/27/2019 CLINICAL DATA:  Brain mass. Lung nodule. EXAM: CT CHEST, ABDOMEN AND PELVIS WITHOUT CONTRAST TECHNIQUE: Multidetector CT imaging of the chest, abdomen and pelvis was performed following the standard protocol without IV contrast. COMPARISON:  Chest radiograph yesterday. CT chest abdomen pelvis 03/01/2018 FINDINGS: CT CHEST FINDINGS Cardiovascular: Aortic atherosclerosis. No aortic aneurysm. Heart is normal in size. No pericardial effusion. Mediastinum/Nodes: Accurate assessment for hilar adenopathy is limited in the absence of IV contrast. There are no enlarged  mediastinal lymph nodes. Endotracheal tube with tip above the carina. Enteric tube decompresses the esophagus. There is a 16 mm nodule that abuts the isthmus of the thyroid, likely unchanged from prior, on the previous exam portions of this region obscured by streak artifact from IV contrast. There is no axillary adenopathy. Lungs/Pleura: Spiculated right middle lobe pulmonary  nodule measures 11 x 11 x 10 mm, only minimally increased in size from prior exam. Margins are irregular and spiculated with small spiculation extending to the pleural surface anteriorly, minimal pleural thickening. No other pulmonary nodule or mass. Dependent opacities in the lung bases favor atelectasis. Trace pleural thickening without significant effusion. Musculoskeletal: No blastic or evidence of destructive lytic lesion. Ordinary degenerative change in the spine. No obvious breast mass. CT ABDOMEN PELVIS FINDINGS Hepatobiliary: Lack of IV contrast limits assessment for focal liver lesion. Capsular calcification involving the dome. No evidence of focal hepatic lesion. Gallbladder physiologically distended, no calcified stone. No biliary dilatation. Pancreas: No ductal dilatation or inflammation. No evidence of focal lesion on this noncontrast exam. Spleen: Normal in size without focal abnormality. Adrenals/Urinary Tract: Slight adrenal thickening without renal nodule. No hydronephrosis. Mild prominence of both renal collecting systems. No evidence of focal renal lesion on noncontrast exam. Urinary bladder is decompressed by Foley catheter. Stomach/Bowel: Enteric tube tip in the stomach. Contrast and fluid in the stomach without evidence of focal gastric mass. No small bowel wall thickening or inflammation. No obstruction, administered enteric contrast reaches the colon. Normal contrast filled appendix. Moderate stool distally. Vascular/Lymphatic: Aortic atherosclerosis. No aortic aneurysm. No enlarged lymph nodes in the abdomen or  pelvis, detailed assessment limited in the absence of IV contrast. Small calcified lymph node adjacent to the proximal sigmoid colon, suggesting prior granulomatous disease. Reproductive: Uterus and bilateral adnexa are unremarkable. No evidence of adnexal mass. Other: No ascites or free fluid. No omental thickening. Musculoskeletal: No blastic or destructive lytic lesions. Transitional lumbosacral anatomy. Degenerative disc disease at L4-L5. There are no acute or suspicious osseous abnormalities. Calcified granuloma in the subcutaneous soft tissues. No suspicious skin or subcutaneous lesion. IMPRESSION: 1. Spiculated right middle lobe pulmonary nodule measuring 11 x 11 x 10 mm, only minimally increased in size from prior exam. This is suspicious for primary bronchogenic malignancy. Evaluation of the hila is limited in the absence of IV contrast, allowing for this limitation, no evidence of nodal metastasis. 2. No other evidence of metastatic disease or primary malignancy in the chest, abdomen, or pelvis. 3. Exophytic 16 mm nodule from the thyroid isthmus, patient had previous thyroid uptake and scan at an outside institution that reported this nodule, however size was not specified, and those images are not available. No thyroid ultrasounds are available for review. Therefore, recommend thyroid US (ref: J Am Coll Radiol. 2015 Feb;12(2): 143-50), unless performed elsewhere. 4. Dependent opacities in the lung bases favor atelectasis. Aortic Atherosclerosis (ICD10-I70.0). Electronically Signed   By: Keith Rake M.D.   On: 06/27/2019 16:56   MR BRAIN W WO CONTRAST  Result Date: 06/30/2019 CLINICAL DATA:  Post mass resection EXAM: MRI HEAD WITHOUT AND WITH CONTRAST TECHNIQUE: Multiplanar, multiecho pulse sequences of the brain and surrounding structures were obtained without and with intravenous contrast. CONTRAST:  79m GADAVIST GADOBUTROL 1 MMOL/ML IV SOLN COMPARISON:  06/27/2019 FINDINGS: Brain: Post resection  of left mass with thin extra-axial collection and resection cavity containing fluid and blood products. There is reduced diffusion at the deep margin likely reflecting postoperative contusion. There is no residual nodular enhancement. Associated edema and regional mass effect are similar. Vascular: Major vessel flow voids at the skull base are preserved. Skull and upper cervical spine: Post left frontal craniotomy. Normal marrow signal is preserved. Sinuses/Orbits: Paranasal sinuses are aerated. Orbits are unremarkable. Other: Sella is unremarkable.  Mastoid air cells are clear. IMPRESSION: Expected postoperative changes of gross total resection  of left frontal mass. Surrounding edema and mild mass effect are similar. Electronically Signed   By: Macy Mis M.D.   On: 06/30/2019 08:08   MR BRAIN W WO CONTRAST  Result Date: 06/27/2019 CLINICAL DATA:  Brain mass.  History of lung nodule. EXAM: MRI HEAD WITHOUT AND WITH CONTRAST TECHNIQUE: Multiplanar, multiecho pulse sequences of the brain and surrounding structures were obtained without and with intravenous contrast. CONTRAST:  5.70m GADAVIST GADOBUTROL 1 MMOL/ML IV SOLN COMPARISON:  CT head 06/26/2019. MRI head without with contrast 06/27/2019 FINDINGS: Brain: SRS protocol with thin sections performed on 3 tesla MRI. Solitary mass left frontal lobe with evidence of internal hemorrhage. The mass measures 2.2 x 2.3 x 2.8 cm. There is extensive vasogenic edema in the white matter. This is causing mass-effect and mild midline shift to the right of approximately 2 mm. Ventricle size normal. No second lesion identified. Mild chronic microvascular ischemic change in the white matter. Negative for acute infarct. Vascular: Normal arterial flow voids Skull and upper cervical spine: No focal skeletal lesion. Sinuses/Orbits: Mild mucosal edema paranasal sinuses. Bilateral orbits Other: None IMPRESSION: Solitary hemorrhagic mass left frontal lobe measuring 2.2 x 2.3 x 2.8  cm. Extensive vasogenic edema. 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 Primary brain tumor not likely. Electronically Signed   By: CFranchot GalloM.D.   On: 06/27/2019 17:43   MR BRAIN W WO CONTRAST  Result Date: 06/27/2019 CLINICAL DATA:  Mass follow-up EXAM: MRI HEAD WITHOUT AND WITH CONTRAST TECHNIQUE: Multiplanar, multiecho pulse sequences of the brain and surrounding structures were obtained without and with intravenous contrast. CONTRAST:  676mGADAVIST GADOBUTROL 1 MMOL/ML IV SOLN COMPARISON:  Head CT from yesterday FINDINGS: Brain: 2.6 cm left lateral frontal mass which appears intra-axial. The mass is T2 hypointense and mildly dense by CT. There is patchy T1 shortening compatible with blood products based on gradient imaging. There is prominent adjacent vasogenic edema. The mass is diffusely enhancing and not a simple hematoma. No second mass is seen.  No infarct, hydrocephalus, or collection. Vascular: Normal flow voids and vascular enhancements Skull and upper cervical spine: Normal marrow signal Sinuses/Orbits: Negative IMPRESSION: 1. 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 There was a worrisome pulmonary nodule on a January 2020 chest CT, updated chest CT may be contributory. 2. Extensive vasogenic edema. Electronically Signed   By: JoMonte Fantasia.D.   On: 06/27/2019 08:12   DG CHEST PORT 1 VIEW  Result Date: 06/28/2019 CLINICAL DATA:  Acute hypoxemic respiratory failure EXAM: PORTABLE CHEST 1 VIEW COMPARISON:  CT 06/27/2019 FINDINGS: *Endotracheal tube in the mid trachea 3 cm from the carina. *Transesophageal tube tip and side port distal to the GE junction curling in the left upper quadrant. *Telemetry leads and support devices overlie the chest. Redemonstration of a right middle lobe pulmonary nodule better seen on comparison CT. Mild basilar atelectatic changes. No acute consolidative opacity or convincing features of edema. No pneumothorax  or effusion. Cardiomediastinal contours are stable. No acute osseous or soft tissue abnormality. Degenerative changes are present in the imaged spine and shoulders. IMPRESSION: 1. Mild basilar atelectatic changes. 2. Redemonstration of right middle lobe pulmonary nodule better seen on comparison CT. 3. Lines and tubes as above. Electronically Signed   By: PrLovena Le.D.   On: 06/28/2019 06:27   DG Chest Port 1 View  Result Date: 06/27/2019 CLINICAL DATA:  Intubation. EXAM: PORTABLE CHEST 1 VIEW COMPARISON:  Chest x-ray 06/26/2019, 03/01/2018.  FINDINGS: Endotracheal tube, NG tube in good anatomic position. NG tube has been advanced its tip and side hole in the stomach. Heart size normal. As previously noted on prior study of 06/26/2018 right lower lobe nodule appears larger. Again further evaluation with chest CT is suggested. Mild left base subsegmental atelectasis. No pleural effusion or pneumothorax. IMPRESSION: 1. Lines and tubes in good anatomic position. NG tube has been advanced its tip and side hole are within the stomach. 2. As noted on prior study of 06/26/2018 right lower lobe pulmonary nodule appears larger. Again further evaluation with chest CT suggested. These results will be called to the ordering clinician or representative by the Radiologist Assistant, and communication documented in the PACS or [employer]. Electronically Signed   By: Marcello Moores  Register   On: 06/27/2019 08:42   DG Chest Portable 1 View  Result Date: 06/26/2019 CLINICAL DATA:  69 year old female status post intubation. EXAM: PORTABLE CHEST 1 VIEW COMPARISON:  Earlier radiograph dated 06/26/2019. FINDINGS: Endotracheal tube with tip approximately 18 mm above the carina. Recommend retraction by 2-3 cm for optimal positioning. Enteric tube with side port above the GE junction. Recommend further advancing by additional 8 cm. No other interval change. IMPRESSION: 1. Endotracheal tube above the carina. 2. Enteric tube with  side port above the GE junction. Recommend further advancing by additional 8 cm. Electronically Signed   By: Anner Crete M.D.   On: 06/26/2019 21:27   DG Chest Portable 1 View  Result Date: 06/26/2019 CLINICAL DATA:  Fever EXAM: PORTABLE CHEST 1 VIEW COMPARISON:  03/01/2018 FINDINGS: The heart size and mediastinal contours are within normal limits. Right lower lung nodule may be slightly increased, now measuring 14 mm. The visualized skeletal structures are unremarkable. IMPRESSION: No active disease. Suspected slight increase in size of right lower lung nodule. Follow-up chest CT is recommended. Electronically Signed   By: Donavan Foil M.D.   On: 06/26/2019 20:48   EEG adult  Result Date: 06/30/2019 Lora Havens, MD     06/30/2019 10:20 AM Patient Name: Zhana Jeangilles MRN: [id number] Epilepsy Attending: Lora Havens Referring Physician/Provider:  Dr. Doreene Nest Date: 06/30/2019 Duration: 24.07 minutes  Patient history: 69 year old female presented with seizure-like episode and was found to have left frontal lobe hemorrhagic mass.  EEG evaluate for seizures.  Level of alertness:  Awake  AEDs during EEG study: Keppra  Technical aspects: This EEG study was done with scalp electrodes positioned according to the 10-20 International system of electrode placement. Electrical activity was acquired at a sampling rate of 500Hz  and reviewed with a high frequency filter of 70Hz  and a low frequency filter of 1Hz . EEG data were recorded continuously and digitally stored.  Description:  No clear posterior dominant rhythm was seen.  EEG showed continuous generalized polymorphic 5 to 9 Hz theta-alpha activity.  Hyperventilation and photic stimulation were not performed. Of note, EEG was technically difficult due to significant eye flutter artifact.  Abnormality -Continuous slow, generalized  IMPRESSION: This technically difficult study suggestive of moderate diffuse encephalopathy, nonspecific etiology. No seizures or  definite epileptiform discharges were seen throughout the recording. Lora Havens   US THYROID  Result Date: 06/28/2019 CLINICAL DATA:  Incidental on CT. Nodule of thyroid isthmus by CT of the chest. EXAM: THYROID ULTRASOUND TECHNIQUE: Ultrasound examination of the thyroid gland and adjacent soft tissues was performed. COMPARISON:  CT of the chest on 06/27/2019 FINDINGS: Parenchymal Echotexture: Mildly heterogenous Isthmus: 1.0 cm Right lobe: 4.4 x 1.7 x 1.2  cm Left lobe: 4.3 x 1.9 x 1.3 cm _________________________________________________________ Estimated total number of nodules >/= 1 cm: 1 Number of spongiform nodules >/=  2 cm not described below (TR1): 0 Number of mixed cystic and solid nodules >/= 1.5 cm not described below (Carter): 0 _________________________________________________________ Nodule # 1: Location: Isthmus Maximum size: 1.6 cm; Other 2 dimensions: 1.4 x 1.1 cm Composition: solid/almost completely solid (2) Echogenicity: hypoechoic (2) Shape: not taller-than-wide (0) Margins: ill-defined (0) Echogenic foci: none (0) ACR TI-RADS total points: 4. ACR TI-RADS risk category: TR4 (4-6 points). ACR TI-RADS recommendations: **Given size (>/= 1.5 cm) and appearance, fine needle aspiration of this moderately suspicious nodule should be considered based on TI-RADS criteria. _________________________________________________________ Simple cyst in the inferior left lobe measures 0.9 cm in greatest diameter and has a benign appearance. No abnormal lymph nodes identified IMPRESSION: 1.6 cm isthmus nodule technically meets criteria for fine-needle aspiration. The nodule is borderline in size for biopsy and 1 year follow-up ultrasound could also be considered. The above is in keeping with the ACR TI-RADS recommendations - J Am Coll Radiol 2017;14:587-595. Electronically Signed   By: Aletta Edouard M.D.   On: 06/28/2019 15:41   Assessment and Plan:  1.  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  Spiculated right middle lobe lung mass 3.  Seizures 4.  Hypertension 5.  Depression 6.  Leukocytosis 7.  Mild anemia  -CT imaging and preliminary biopsy results were discussed with the patient's son.  We discussed that the biopsy shows metastatic adenocarcinoma.  Suspect this may have come from her lung mass. -Will discuss with Dr. Marin Olp any need for additional work-up such as biopsy of her lung mass. -She will need outpatient PET scan. -Continue dexamethasone for vasogenic edema per neurosurgery. -We will consider radiation oncology referral for consideration of postop radiation to prevent local recurrence. -She remains on Keppra with no recurrent seizures reported.  Thank you for this referral.   Mikey Bussing, DNP, AGPCNP-BC, AOCNP  ADDENDUM: I saw and examined Ms.Isola.  Her son was interpreter.  She is originally from [place].  She has been in the [place] since 2013.  She just had a brain met resected.  The issue now is what the histology is.  She does have a right middle lobe nodule which really is not that big.  There really is no evidence of disease elsewhere.  She is a nonsmoker.  I have to believe that this is probably going to be an adenocarcinoma.  I am not seeing any biopsy results as of yet.  Given that she is Asian in a non-smoking woman, I would think adenocarcinoma would be most likely.  She will clearly need radiation therapy to the brain when she recovers from surgery.  If she just has the solitary lung lesion in the right lung with nothing else noted on a PET scan, which has to be done as an outpatient, I would seriously consider going for resecting the lung lesion.  This is a very unusual case where she clearly has oligometastatic disease and given that she seemed to be in pretty good shape prior to her neurological difficulties, I would believe that she would benefit from surgical resection of the right middle lobe nodule.  I  suppose the other option that we have is stereotactic radiosurgery of the right middle lobe nodule.  The nodule is small enough that radiosurgery I think would be an option.  I would also think that once we get  the pathology back, we will send the specimen off for molecular markers.  Given that she is an [place] woman that is a non-smoker, the chances are likely that she would have an EGFR mutation with her cancer.  Again, I think what needs to be done next is a PET scan.  Again this can only be done as an outpatient.  She needs to have Radiation Oncology see her to plan for outpatient radiation therapy to the brain post resection of the cerebral metastasis.  I think that her son understood what I was talking about.  She really cannot talk right now.  I do not think she understands [place].  We will follow along and help out as we get more information back about the tumor.  Lattie Haw, MD  Proverbs 18:10

## 2019-06-30 NOTE — Progress Notes (Signed)
vLTM EEG started following spot EEG. Notified neuro

## 2019-06-30 NOTE — Progress Notes (Signed)
The morning basic metabolic panel results that resulted at 0625 was not entered correctly on lab's behalf. The true results will show after that time.

## 2019-06-30 NOTE — Progress Notes (Signed)
NAMEJaniyla Schroeder, MRN:  [id number], DOB:  25-Nov-1950, LOS: 3 ADMISSION DATE:  06/27/2019, CONSULTATION DATE:  06/26/19 REFERRING MD:  [facility], CHIEF COMPLAINT:  Seizure  Brief History   [name] is a 69 yo F with h/o HTN, anxiety, and MDD who presented with seizure and was found to have a large hemorrhagic L frontal lobe mass. Briefly, she had new onset seizure at [facility] on 5/3 characterized by R-sided convulsions and twitching. EMS arrived and she had another seizure in transit. She was given Ativan and Keppra at [facility] ED. Vitals on presentation significant for elevated BP of 170s/90s, HR 100s, T37. Labs were unremarkable except WBC of 17.2. CT head showed 2.7x2.0x2.8cm hemorrhagic mass in the L frontal lobe. Pt was intubated for airway protection and transferred to [facility] ICU. Pt's son and granddaughter deny history of seizures.   Past Medical History  HTN Depression Anxiety  Significant Hospital Events   5/3: Multiple seizures, intubated for airway protection.  5/3: L frontal hemorrhagic mass on CT head  5/4: Transferred to [facility] Neuro ICU  Consults:  Neurosurgery (5/4)  Procedures:  5/3: ETT   Significant Diagnostic Tests:  5/3 CT Head noncon> L frontal lobe hemorrhage 2.7x2.0 x 2.8 cm with surrounding edema. 68mm rightward midline shift. No herniation.  5/3 CXR> ETT 79mm above carina. Enteric tube above JE junction. Recommend retraction of ETT 2-3 cm, advancement of Enteric tube 8 cm. 5/4 CXR: ETT 2-3 cm above carine. OG tube in stomach.  5/4 MRI brain: Solitary hemorrhagic mass left frontal lobe measuring 2.2 x 2.3 x 2.8 cm. Extensive vasogenic edema. 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 Primary brain tumor not likely. 5/4: CT Chest, Abd, Pelvis >> 1. Spiculated right middle lobe pulmonary nodule measuring 11 x 11 x 10 mm, only minimally increased in size from prior exam. This is suspicious for primary bronchogenic malignancy. Evaluation of the hila is limited in the  absence of IV contrast, allowing for this limitation, no evidence of nodal metastasis. 2. No other evidence of metastatic disease or primary malignancy in the chest, abdomen, or pelvis. 3. Exophytic 16 mm nodule from the thyroid isthmus, patient had previous thyroid uptake and scan at an outside institution that reported this nodule, however size was not specified, and those images are not available. No thyroid ultrasounds are available for review. Therefore, recommend thyroid US (ref: J Am Coll Radiol. 2015 Feb;12(2): 143-50), unless performed elsewhere. 4. Dependent opacities in the lung bases favor atelectasis. 5/5: US Thyroid: >> 1.6 cm isthmus nodule technically meets criteria for fine-needle aspiration. The nodule is borderline in size for biopsy and 1 year follow-up ultrasound could also be considered. 5/6: Brain MRI s/p surgery: >> Expected postoperative changes of gross total resection of left frontal mass. Surrounding edema and mild mass effect are similar.  Micro Data:  5/3: SARS Cov2/Influenza A/Influenza B: Negative  Antimicrobials:  None    Interim history/subjective:  5/7: Pt did well post-operatively but was unable to be extubated. During her bath at 2200 on 5/6, pt had possible seizure with R gaze deviation and stiffening. Keppra increased to 1000 q12h and has not had recurrence. Preliminary path from surgery showed adenocarcinoma. Bps up to 170s with discontinuation of propofol, responsive to labetalol prn x1. VS otherwise stable. Pt is now extubated SpO2 100 on 6L. Pt moving her L side well but cannot move R arm or leg against gravity. Does have some tone. Morning labs unremarkable.  5/6: NAE.  Pt intubated, sedated, and comfortable. Pt able to sit up with decreased sedation yesterday. Continues to tolerate tube feeds but is NPO currently for surgery today. VSS on minimal vent settings of 400/30%/+5. Adequate UOP of 2.1L. Thyroid US showed 1.6 cm isthmus nodule. Morning  labs unremarkable.   5/5: NAE. Pt intubated, sedated, and comfortable on below settings. Began tube feeds and tolerating well. VSS. TSH low at 0.124 but free T4 wnl. WBC 22.9 from 17, K 4.2 Mg 2.2. CT chest, abd, pelvis showed spiculated pulmonary nodule in R middle lobe c/f primary bronchogenic malignancy. Also showed exophytic 16 mm thyroid nodule. Brain MRI showed solitary hemorrhagic mass favoring solitary metastasis.   5/4: NAE. Pt received 1x Labetalol prn and VSS since, BP 110s/60s, HR 80s. SpO2 100 on PRVC FiO2 40%, PEEP 5, Plat 13. ABG at 0400 was 7.4/34.7/200/21. Morning labs significant for K 4.0 from 3.2, TSH 0.124. 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 Pt did have concerning lung nodule in January 2020 that has not been followed. Son at the bedside, says they would like to undergo surgery here if it is "simple enough".   Objective   Blood pressure (!) 165/85, pulse 99, temperature 98.2 F (36.8 C), temperature source Axillary, resp. rate 17, height 5\' 2"  (1.575 m), weight 56.8 kg, SpO2 100 %.    Vent Mode: PSV;CPAP FiO2 (%):  [30 %] 30 % Set Rate:  [20 bmp] 20 bmp Vt Set:  [400 mL] 400 mL PEEP:  [5 cmH20] 5 cmH20 Pressure Support:  [5 cmH20] 5 cmH20 Plateau Pressure:  [14 cmH20-18 cmH20] 16 cmH20   Intake/Output Summary (Last 24 hours) at 06/30/2019 0928 Last data filed at 06/30/2019 0700 Gross per 24 hour  Intake 1986.48 ml  Output 1985 ml  Net 1.48 ml   Filed Weights   06/28/19 0446 06/29/19 0500 06/30/19 0500  Weight: 57.6 kg 57 kg 56.8 kg    Examination: Gen:  Appears comfortable, awake. Alert.  HEENT: atraumatic, normocephalic, pupils equal, responsive to light Neck: no cervical lymphadenopathy, no JVD, no palpable thyroid nodule however neck fullness present  Heart: RRR, S1, S2, no M/R/G, no chest wall tenderness Lungs: CTAB, no crackles or wheezes Abdomen: Normoactive bowel sounds, soft nondistended  Extremities: no clubbing, cyanosis, or edema: pulses are +2. Strength 4/5 in L Upper and lower extremity, moving freely. Strength 1/5 in R upper and L extremity, however has some tone and visible muscle contraction.  Neuro: Pupillary reflex intact. Sensation full and equal in upper and lower extremities. CNII - XII without deficit bilaterally.  Skin:  No rashes, lesions   Resolved Hospital Problem list   none  Assessment & Plan:  Ms. Manka is a 69 yo F with h/o HTN, MDD who presented to [facility] with new, acute onset seizure found to have hemorrhagic L frontal lobe mass on head CT and spiculated pulmonary nodule on CT chest, c/f primary bronchogenic carcinoma with metastasis.   Intracranial Hemorrhage of L Frontal lobe  L frontal lobe hemorrhagic mass s/p resection on 5/6: Pt presented with seizure and found to have 2.7x2.0x2.8 cm h/emorrhagic mass in L frontal lobe with surrounding edema and 2.3mm R midline shift on 5/4. VSS and no signs of overt elevated ICP. Neurosurgery consulted and following. Pt's son had GBM operated on at [facility] 8 months ago and has since undergone extensive chemo and radiation. Pt's family initially inclined to attempt to go to [facility] for surgery but  now intend to do it at [facility]. MRI brain on 5/4 showed Solitary hemorrhagic mass left frontal lobe measuring 2.2 x 2.3 x 2.8 cm favoring solitary metastatic deposit over primary brain tumor. Pt had worrisome pulmonary nodule on CT in Jan 2020 which was redemonstrated with interval growth as below. Preliminary pathology from neurosurgery showed adenocarcinoma. F/u frain MRI on 5/6 showed total resection of mass. Worry that she may be aphasic / plegic on R, however we will do a full neurologic exam when she is further removed from sedation.  - Neurosurgery consulted. S/p resection  - Most likely metastatic adenocarcinoma  - Consulted Oncology, appreciate recs  - Decadron ongoing  Pulmonary Nodule: Pt had concerning pulmonary nodule on Chest  CT in Jan 2020, which was not followed. CT chest abd pelvis on 5/4 showed Spiculated right middle lobe pulmonary nodule measuring 11 x 11 x 10 mm, minimally increased in size from prior exam c/f primary bronchogenic malignancy. There was not evidence of nodal metastasis however contrast was not used. Brain mass pathology showed adenocarcinoma; at this point, primary bronchogenic with mets to brain is most likely.  - Consulted Oncology, appreciate recs  - plan for PET/CT as outpatient    Acute Respiratory Failure: Pt was unable to protect airway 2/2 multiple seizures and was intubated on 5/3. Most recent ABG 7.4/34.7/200/21, which is reassuring. CXR confirmed ETT placement 3 cm above carina. Pt continues to do well on PRVC FiO2 30, PEEP 5, Plat 13. Did well with SBT on 5/5 and was able to sit up. Pt extubated morning of 5/7 and continues to do well. - Resp support as needed  - Will pursue better post-op pain control as respiratory status improves off vent  Seizures: 2/2 L frontal hemorrhage. Pt presented with multiple seizures and was unable to protect her airway. She received Ativan acutely as well as Keppra. Currently on Keppra and sedation with propofol, and has not had recurrence of seizures. Pt had EEG on 5/4 without seizures captured. Pt had recurrent seizure activity on 5/6 evening with R gaze deviation and stiffening; Keppra increased without recurrence. Spoke with neurosurgery who rec'd LTM with EEG; we will get one EEG and proceed from there.  - Seizure precautions  - Keppra increased to 1000 q12h - EEG once; considering LTM   Exophytic thyroid mass  Low TSH:  Pt's son emphasized concern regarding pt's thyroid history. Though it is unclear, he mentioned that she may have a history of thyroid nodules and disease, and remarked that she "took pills for thyroid back home" but has not taken anything since [place] the [place] TSH low at 0.124 on 5/4 however free T4 wnl at 0.99. Son denies sx of  hyperthyroid in recent history however he is unsure. Her presentation is not consistent with thyroid storm as pt has been normal from a thermoregulatory and vitals standpoint. CT chest abd pelvis revealed Exophytic 16 mm nodule from the thyroid isthmus. She did have thyroid uptake and scan at OSH which did not specify size. Thyroid US showed 1.6 cm isthmus nodule technically meets criteria for FNA vs 1 year f/u US. Family informed.   - F/u US in 1 year vs FNA for thyroid nodule   HTN: Pt has h/o HTN on HCTZ however questionable compliance. Bps elevated as sedation weaned. We began Labetalol prn with good response.  - Continue Labetalol prn  - Opioids for pain when respiratory status stabilizes off vent   FEN/GI: Pt began tube feeds on 5/4  and is tolerating well. Lytes wnl.  - No need for lyte repletion today  - Daily BMP   Best practice:  Diet: Tube feeds  Pain/Anxiety/Delirium protocol (if indicated): Fentanyl prn  ; if she swallows will move to hydrocodone  VAP protocol (if indicated): per protocol  DVT prophylaxis: SCDs GI prophylaxis: Protonix  Glucose control: none indicated  Mobility: Intubated, sedated, bedrest Code Status: FULL Family Communication: 5/6 AM: Son at bedside.  Disposition: ICU   Labs   CBC: Recent Labs  Lab 06/26/19 2002 06/27/19 0401 06/28/19 0720 06/29/19 0635 06/30/19 0625  WBC 17.9*  --  22.9* 21.2* 19.3*  NEUTROABS 15.2*  --   --   --   --   HGB 13.1 11.9* 11.5* 11.7* 10.7*  HCT 38.7 35.0* 35.0* 36.1 32.9*  MCV 91.7  --  95.4 94.3 93.7  PLT 230  --  203 196 614    Basic Metabolic Panel: Recent Labs  Lab 06/27/19 0928 06/27/19 1853 06/28/19 0720 06/28/19 0720 06/28/19 1626 06/29/19 0635 06/29/19 1627 06/30/19 0625 06/30/19 0635  NA 139  --  137  --   --  141  --  147* 140  K 4.0  --  4.2  --   --  4.0  --  4.4 3.6  CL 109  --  108  --   --  104  --  116* 104  CO2 19*  --  21*  --   --  25  --  19* 23  GLUCOSE 147*  --  183*  --   --   159*  --  104* 144*  BUN 15  --  25*  --   --  26*  --  19 22  CREATININE 0.67  --  0.67  --   --  0.71  --  0.91 0.71  CALCIUM 8.9  --  9.3  --   --  9.5  --  8.6* 8.7*  MG 1.9  --  2.2   < > 2.4 2.2 2.1 2.5* 2.1  PHOS 2.9   < > 2.5  --  2.2* 2.7  --  3.2 3.8   < > = values in this interval not displayed.   GFR: Estimated Creatinine Clearance: 53.2 mL/min (by C-G formula based on SCr of 0.71 mg/dL). Recent Labs  Lab 06/26/19 2002 06/28/19 0720 06/29/19 0635 06/30/19 0625  WBC 17.9* 22.9* 21.2* 19.3*    Liver Function Tests: Recent Labs  Lab 06/26/19 2002 06/27/19 0928  AST 25 26  ALT 17 17  ALKPHOS 82 60  BILITOT 1.2 1.2  PROT 7.9 6.1*  ALBUMIN 4.8 3.4*   No results for input(s): LIPASE, AMYLASE in the last 168 hours. No results for input(s): AMMONIA in the last 168 hours.  ABG    Component Value Date/Time   PHART 7.391 06/27/2019 0401   PCO2ART 34.7 06/27/2019 0401   PO2ART 200 (H) 06/27/2019 0401   HCO3 21.0 06/27/2019 0401   TCO2 22 06/27/2019 0401   ACIDBASEDEF 3.0 (H) 06/27/2019 0401   O2SAT 100.0 06/27/2019 0401     Coagulation Profile: Recent Labs  Lab 06/26/19 2002  INR 0.9    Cardiac Enzymes: No results for input(s): CKTOTAL, CKMB, CKMBINDEX, TROPONINI in the last 168 hours.  HbA1C: No results found for: HGBA1C  CBG: Recent Labs  Lab 06/29/19 1157 06/29/19 1916 06/29/19 2324 06/30/19 0328 06/30/19 0802  GLUCAP 127* 127* 126* 158* 144*      Critical care time:  The patient is critically ill with multiple organ systems failure and requires high complexity decision making for assessment and support, frequent evaluation and titration of therapies, application of advanced monitoring technologies and extensive interpretation of multiple databases.  Additional Critical care time 32 mins. This represents my time independent of the medical students/residents/NPs time taking care of the pt. This is excluding procedures.    Ivin Poot, MS4

## 2019-06-30 NOTE — Progress Notes (Addendum)
Neurosurgery Service Progress Note  Subjective: NAE ON, had a roughly 30 sec period of possible clonic movement on the right, but pt is intubated so difficult to further evaluate, keppra inc'd without repeat event  Objective: Vitals:   06/30/19 0700 06/30/19 0800 06/30/19 0808 06/30/19 0828  BP: 123/69 (!) 171/105 (!) 171/105 (!) 165/85  Pulse: 78 (!) 124 (!) 123 99  Resp: 20 20 (!) 22 17  Temp:      TempSrc:      SpO2: 99% 100% 100% 100%  Weight:      Height:       Temp (24hrs), Avg:98.7 F (37.1 C), Min:97.3 F (36.3 C), Max:99.9 F (37.7 C)  CBC Latest Ref Rng & Units 06/30/2019 06/29/2019 06/28/2019  WBC 4.0 - 10.5 K/uL 19.3(H) 21.2(H) 22.9(H)  Hemoglobin 12.0 - 15.0 g/dL 10.7(L) 11.7(L) 11.5(L)  Hematocrit 36.0 - 46.0 % 32.9(L) 36.1 35.0(L)  Platelets 150 - 400 K/uL 165 196 203   BMP Latest Ref Rng & Units 06/30/2019 06/30/2019 06/29/2019  Glucose 70 - 99 mg/dL 144(H) 104(H) 159(H)  BUN 8 - 23 mg/dL 22 19 26(H)  Creatinine 0.44 - 1.00 mg/dL 0.71 0.91 0.71  Sodium 135 - 145 mmol/L 140 147(H) 141  Potassium 3.5 - 5.1 mmol/L 3.6 4.4 4.0  Chloride 98 - 111 mmol/L 104 116(H) 104  CO2 22 - 32 mmol/L 23 19(L) 25  Calcium 8.9 - 10.3 mg/dL 8.7(L) 8.6(L) 9.5    Intake/Output Summary (Last 24 hours) at 06/30/2019 0843 Last data filed at 06/30/2019 0700 Gross per 24 hour  Intake 2201.54 ml  Output 1985 ml  Net 216.54 ml    Current Facility-Administered Medications:  .  acetaminophen (TYLENOL) tablet 650 mg, 650 mg, Per Tube, Q4H PRN, Audria Nine, DO .  chlorhexidine gluconate (MEDLINE KIT) (PERIDEX) 0.12 % solution 15 mL, 15 mL, Mouth Rinse, BID, Anders Simmonds, MD, 15 mL at 06/30/19 0750 .  Chlorhexidine Gluconate Cloth 2 % PADS 6 each, 6 each, Topical, Daily, Ashok Pall, MD, 6 each at 06/29/19 2146 .  dexamethasone (DECADRON) injection 4 mg, 4 mg, Intravenous, Q6H, Eustace Moore, MD, 4 mg at 06/30/19 0555 .  docusate (COLACE) 50 MG/5ML liquid 100 mg, 100 mg, Per Tube, BID,  Bowser, Laurel Dimmer, NP, 100 mg at 06/29/19 2135 .  feeding supplement (PRO-STAT SUGAR FREE 64) liquid 30 mL, 30 mL, Per Tube, Daily, Ruthann Cancer, Jessica, DO, 30 mL at 06/28/19 1037 .  feeding supplement (VITAL AF 1.2 CAL) liquid 1,000 mL, 1,000 mL, Per Tube, Continuous, Audria Nine, DO, Stopped at 06/29/19 0001 .  fentaNYL (SUBLIMAZE) injection 25 mcg, 25 mcg, Intravenous, Q15 min PRN, Bowser, Grace E, NP .  fentaNYL (SUBLIMAZE) injection 25-100 mcg, 25-100 mcg, Intravenous, Q30 min PRN, Bowser, Grace E, NP, 100 mcg at 06/30/19 0410 .  labetalol (NORMODYNE) injection 10-20 mg, 10-20 mg, Intravenous, Q2H PRN, Rigoberto Noel, MD, 10 mg at 06/30/19 0827 .  lactated ringers infusion, , Intravenous, Continuous, Bowser, Grace E, NP, Last Rate: 50 mL/hr at 06/30/19 0700, Rate Verify at 06/30/19 0700 .  levETIRAcetam (KEPPRA) IVPB 1000 mg/100 mL premix, 1,000 mg, Intravenous, Q12H, Ashok Pall, MD, Last Rate: 400 mL/hr at 06/30/19 0817, 1,000 mg at 06/30/19 0817 .  LORazepam (ATIVAN) injection 2 mg, 2 mg, Intravenous, Q5 min PRN, Bowser, Laurel Dimmer, NP, 2 mg at 06/27/19 1610 .  MEDLINE mouth rinse, 15 mL, Mouth Rinse, 10 times per day, Anders Simmonds, MD, 15 mL at 06/30/19 0547 .  ondansetron (ZOFRAN) injection 4 mg, 4 mg, Intravenous, Q6H PRN, Bowser, Grace E, NP .  pantoprazole (PROTONIX) injection 40 mg, 40 mg, Intravenous, QHS, Bowser, Grace E, NP, 40 mg at 06/29/19 2135 .  polyethylene glycol (MIRALAX / GLYCOLAX) packet 17 g, 17 g, Per Tube, Daily PRN, Audria Nine, DO .  propofol (DIPRIVAN) 1000 MG/100ML infusion, 0-50 mcg/kg/min, Intravenous, Continuous, Blenda Nicely, RPH, Last Rate: 13.82 mL/hr at 06/30/19 0700, 40 mcg/kg/min at 06/30/19 0700   Physical Exam: Intubated, sedated, eyes open to voice, trying to sit up in bed, MAEx4, slower on the R than left  Assessment & Plan: 69 y.o. woman p/w seizures, MRI brain with left frontal 23x40m hemorrhagic mass, CT CAP with pulmonary nodule  with some growth since 2020 CT.  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

## 2019-06-30 NOTE — Progress Notes (Signed)
PT Cancellation Note  Patient Details Name: Sara Schroeder MRN: [id number] DOB: 30-Apr-1950   Cancelled Treatment:    Reason Eval/Treat Not Completed: Active bedrest order. Pt also on continuous EEG, RN requesting PT hold until EEG complete. PT will follow up when off bedrest and medically appropriate for PT intervention.   Zenaida Niece 06/30/2019, 2:57 PM

## 2019-06-30 NOTE — Progress Notes (Signed)
69 year old [place] woman,admitted with seizure, found to have hemorrhagic left frontal lobe mass 2.8 cm  CT chest/abdomen/pelvis from 5/4 >> spiculated right middle lobe nodule 11 mm increased from 10 mm in 02/2019 and 16 mm thyroid nodule noted prior  EEG negative  5/6 underwent resection of brain mass >> prelim metastatic adenocarcinoma  Episode of brief 30 sec clonic movement on the right  Vitals:   06/30/19 0828 06/30/19 0859  BP: (!) 165/85   Pulse: 99   Resp: 17   Temp:    SpO2: 100% 100%    This a.m. -awake, follows commands with son translating, right hemiparesis, flicker movement, bilateral ventilated breath sounds, S1-S2 tacky when propofol turned off and hypertensive  Labs show mild hypokalemia, leukocytosis.  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 EEG spot and LTM per neurosurgery  Son updated at bedside  The patient is critically ill with multiple organ systems failure and requires high complexity decision making for assessment and support, frequent evaluation and titration of therapies, application of advanced monitoring technologies and extensive interpretation of multiple databases. Critical Care Time devoted to patient care services described in this note independent of APP/resident  time is 32 minutes.   Leanna Sato Elsworth Soho MD

## 2019-06-30 NOTE — Progress Notes (Signed)
Patient transported to MRI and back to 2S91 without complications.

## 2019-07-01 DIAGNOSIS — I612 Nontraumatic intracerebral hemorrhage in hemisphere, unspecified: Secondary | ICD-10-CM | POA: Diagnosis not present

## 2019-07-01 DIAGNOSIS — C7931 Secondary malignant neoplasm of brain: Secondary | ICD-10-CM | POA: Diagnosis not present

## 2019-07-01 LAB — BASIC METABOLIC PANEL
Anion gap: 12 (ref 5–15)
BUN: 17 mg/dL (ref 8–23)
CO2: 23 mmol/L (ref 22–32)
Calcium: 8.8 mg/dL — ABNORMAL LOW (ref 8.9–10.3)
Chloride: 103 mmol/L (ref 98–111)
Creatinine, Ser: 0.64 mg/dL (ref 0.44–1.00)
GFR calc Af Amer: >60 mL/min (ref >60)
GFR calc non Af Amer: >60 mL/min (ref >60)
Glucose, Bld: 124 mg/dL — ABNORMAL HIGH (ref 70–99)
Potassium: 3.4 mmol/L — ABNORMAL LOW (ref 3.5–5.1)
Sodium: 138 mmol/L (ref 135–145)

## 2019-07-01 LAB — CBC
HCT: 31.2 % — ABNORMAL LOW (ref 36.0–46.0)
Hemoglobin: 10.1 g/dL — ABNORMAL LOW (ref 12.0–15.0)
MCH: 30.3 pg (ref 26.0–34.0)
MCHC: 32.4 g/dL (ref 30.0–36.0)
MCV: 93.7 fL (ref 80.0–100.0)
Platelets: 156 10*3/uL (ref 150–400)
RBC: 3.33 MIL/uL — ABNORMAL LOW (ref 3.87–5.11)
RDW: 13.6 % (ref 11.5–15.5)
WBC: 16.1 10*3/uL — ABNORMAL HIGH (ref 4.0–10.5)
nRBC: 0 % (ref 0.0–0.2)

## 2019-07-01 LAB — GLUCOSE, CAPILLARY
Glucose-Capillary: 118 mg/dL — ABNORMAL HIGH (ref 70–99)
Glucose-Capillary: 119 mg/dL — ABNORMAL HIGH (ref 70–99)
Glucose-Capillary: 127 mg/dL — ABNORMAL HIGH (ref 70–99)
Glucose-Capillary: 141 mg/dL — ABNORMAL HIGH (ref 70–99)
Glucose-Capillary: 153 mg/dL — ABNORMAL HIGH (ref 70–99)

## 2019-07-01 LAB — CULTURE, BLOOD (ROUTINE X 2)
Culture: NO GROWTH
Culture: NO GROWTH

## 2019-07-01 LAB — MAGNESIUM: Magnesium: 2 mg/dL (ref 1.7–2.4)

## 2019-07-01 LAB — PHOSPHORUS: Phosphorus: 3 mg/dL (ref 2.5–4.6)

## 2019-07-01 MED ORDER — POTASSIUM CHLORIDE 10 MEQ/100ML IV SOLN
10.0000 meq | INTRAVENOUS | Status: AC
  Administered 2019-07-01 (×4): 10 meq via INTRAVENOUS
  Filled 2019-07-01: qty 100

## 2019-07-01 MED ORDER — DEXAMETHASONE SODIUM PHOSPHATE 4 MG/ML IJ SOLN
4.0000 mg | Freq: Three times a day (TID) | INTRAMUSCULAR | Status: DC
  Administered 2019-07-01 – 2019-07-03 (×6): 4 mg via INTRAVENOUS
  Filled 2019-07-01 (×6): qty 1

## 2019-07-01 MED ORDER — POTASSIUM CHLORIDE 20 MEQ PO PACK: 40.0000 meq | PACK | Freq: Once | ORAL | Status: DC

## 2019-07-01 MED ORDER — ACETAMINOPHEN 325 MG PO TABS
650.0000 mg | ORAL_TABLET | ORAL | Status: DC | PRN
  Administered 2019-07-01 – 2019-07-06 (×7): 650 mg via ORAL
  Filled 2019-07-01 (×7): qty 2

## 2019-07-01 NOTE — Progress Notes (Signed)
Subjective: Extubated yesterday  Objective: Vital signs in last 24 hours: Temp:  [98.3 F (36.8 C)-100.3 F (37.9 C)] 99.2 F (37.3 C) (05/08 0400) Pulse Rate:  [82-101] 94 (05/08 0900) Resp:  [18-33] 20 (05/08 0900) BP: (137-172)/(67-124) 137/73 (05/08 0900) SpO2:  [89 %-100 %] 98 % (05/08 0900) Weight:  [57 kg] 57 kg (05/08 0500)  Intake/Output from previous day: 05/07 0701 - 05/08 0700 In: 1335.7 [I.V.:1135.7; IV Piggyback:200] Out: 2700 [Urine:2700] Intake/Output this shift: Total I/O In: 237.4 [I.V.:137.4; IV Piggyback:100] Out: -   Making sounds but no speech.  Follows commands with son.  No movement RUE, 3/5 RLE.  Full strength on left. Incision c/d  Lab Results: Recent Labs    06/30/19 0625 07/01/19 0554  WBC 19.3* 16.1*  HGB 10.7* 10.1*  HCT 32.9* 31.2*  PLT 165 156   BMET Recent Labs    06/30/19 0635 07/01/19 0554  NA 140 138  K 3.6 3.4*  CL 104 103  CO2 23 23  GLUCOSE 144* 124*  BUN 22 17  CREATININE 0.71 0.64  CALCIUM 8.7* 8.8*    Studies/Results: MR BRAIN W WO CONTRAST  Result Date: 06/30/2019 CLINICAL DATA:  Post mass resection EXAM: MRI HEAD WITHOUT AND WITH CONTRAST TECHNIQUE: Multiplanar, multiecho pulse sequences of the brain and surrounding structures were obtained without and with intravenous contrast. CONTRAST:  11m GADAVIST GADOBUTROL 1 MMOL/ML IV SOLN COMPARISON:  06/27/2019 FINDINGS: Brain: Post resection of left mass with thin extra-axial collection and resection cavity containing fluid and blood products. There is reduced diffusion at the deep margin likely reflecting postoperative contusion. There is no residual nodular enhancement. Associated edema and regional mass effect are similar. Vascular: Major vessel flow voids at the skull base are preserved. Skull and upper cervical spine: Post left frontal craniotomy. Normal marrow signal is preserved. Sinuses/Orbits: Paranasal sinuses are aerated. Orbits are unremarkable. Other: Sella is  unremarkable.  Mastoid air cells are clear. IMPRESSION: Expected postoperative changes of gross total resection of left frontal mass. Surrounding edema and mild mass effect are similar. Electronically Signed   By: PMacy MisM.D.   On: 06/30/2019 08:08   EEG adult  Result Date: 06/30/2019 YLora Havens MD     07/01/2019  8:50 AM Patient Name: Sara BoakMRN: [id number] Attending: PLora HavensReferring Physician/Provider:  Dr. RKara MeadDate: 06/30/2019 Duration: 24.07 minutes  Patient history: 69year old female presented with seizure-like episode and was found to have left frontal lobe hemorrhagic mass.  EEG evaluate for seizures.  Level of alertness:  Awake  AEDs during EEG study: Keppra  Technical aspects: This EEG study was done with scalp electrodes positioned according to the 10-20 International system of electrode placement. Electrical activity was acquired at a sampling rate of 500Hz  and reviewed with a high frequency filter of 70Hz  and a low frequency filter of 1Hz . EEG data were recorded continuously and digitally stored.  Description:  No clear posterior dominant rhythm was seen.  EEG showed continuous generalized polymorphic 5 to 9 Hz theta-alpha activity.  Hyperventilation and photic stimulation were not performed. Of note, EEG was technically difficult due to significant eye flutter artifact.  Abnormality -Continuous slow, generalized  IMPRESSION: This technically difficult study suggestive of moderate diffuse encephalopathy, nonspecific etiology. No seizures or definite epileptiform discharges were seen throughout the recording. Priyanka OBarbra [name]  Overnight EEG with video  Result Date: 07/01/2019 YLora Havens MD     07/01/2019  9:06 AM Patient Name:Sara Schroeder MGEZ:[id number]  Epilepsy Attending:Priyanka [name] [name] Referring Physician/Provider: Dr. Emelda Brothers Duration:06/30/2019 [phone] to 07/01/2019 0900  Patient history:69 year old female presented with seizure-like  episode and was found to have left frontal lobe hemorrhagic mass. EEG evaluate for seizures.  Level of alertness: awake, asleep  AEDs during EEG study:Keppra  Technical aspects: This EEG study was done with scalp electrodes positioned according to the 10-20 International system of electrode placement. Electrical activity was acquired at a sampling rate of 500Hz  and reviewed with a high frequency filter of 70Hz  and a low frequency filter of 1Hz . EEG data were recorded continuously and digitally stored.  Description: No clear posterior dominant rhythm was seen. Sleep was characterized by vertex waves, sleep spindles (12-14hz ), maximal frontocentral region. EEG showed continuous generalized and lateralized left frontotemporal region  3-6 Hz theta-delta slowing.  Hyperventilation and photic stimulation were not performed.  Abnormality -Continuous slow, generalized and lateralized left frontotemporal region  IMPRESSION: This study suggestive of cortical dysfunction in left frontotemporal region consistent with underlying structural abnormality. Additionally, there is evidence of moderate diffuse encephalopathy, nonspecific etiology.No seizures ordefiniteepileptiform discharges were seen throughout the recording.  Priyanka [name] [name]    Assessment/Plan: S/p resection of brain met in left frontal lobe - patient will need PT/OT, speech therapy - will need to remain on AEDs - steroid taper - possible candidate for downgrade   Vallarie Mare 07/01/2019, 9:49 AM

## 2019-07-01 NOTE — Progress Notes (Addendum)
NAMEAbilene Schroeder, MRN:  [id number], DOB:  12-18-1950, LOS: 4 ADMISSION DATE:  06/27/2019, CONSULTATION DATE:  06/26/19 REFERRING MD:  [facility], CHIEF COMPLAINT:  Seizure  Brief History   Sara Schroeder is a 69 yo F with h/o HTN, anxiety, and MDD who presented with seizure and was found to have a large hemorrhagic L frontal lobe mass. Briefly, she had new onset seizure at [facility] on 5/3 characterized by R-sided convulsions and twitching. EMS arrived and she had another seizure in transit. She was given Ativan and Keppra at [facility] ED. Vitals on presentation significant for elevated BP of 170s/90s, HR 100s, T37. Labs were unremarkable except WBC of 17.2. CT head showed 2.7x2.0x2.8cm hemorrhagic mass in the L frontal lobe. Pt was intubated for airway protection and transferred to [facility] ICU. Pt's son and granddaughter deny history of seizures.   Past Medical History  HTN Depression Anxiety  Significant Hospital Events   5/3: Multiple seizures, intubated for airway protection.  5/3: L frontal hemorrhagic mass on CT head  5/4: Transferred to [facility] Neuro ICU 5/6 Left craniectomy, tumor resection. Prelim path cw adenoca 5/7 Extubated, remains on LTM  Consults:  Neurosurgery (5/4)  Procedures:  5/3 >> 5/7 ETT   Significant Diagnostic Tests:  5/3 CT Head noncon> L frontal lobe hemorrhage 2.7x2.0 x 2.8 cm with surrounding edema. 7m rightward midline shift. No herniation.  5/3 CXR> ETT 166mabove carina. Enteric tube above JE junction. Recommend retraction of ETT 2-3 cm, advancement of Enteric tube 8 cm. 5/4 CXR: ETT 2-3 cm above carine. OG tube in stomach.  5/4 MRI brain: Solitary hemorrhagic mass left frontal lobe measuring 2.2 x 2.3 x 2.8 cm. Extensive vasogenic edema. 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 Primary brain tumor not likely. 5/4: CT Chest, Abd, Pelvis >> 1. Spiculated right middle lobe pulmonary nodule measuring 11 x 11 x 10 mm, only minimally increased in size from prior exam.  This is suspicious for primary bronchogenic malignancy. Evaluation of the hila is limited in the absence of IV contrast, allowing for this limitation, no evidence of nodal metastasis. 2. No other evidence of metastatic disease or primary malignancy in the chest, abdomen, or pelvis. 3. Exophytic 16 mm nodule from the thyroid isthmus, patient had previous thyroid uptake and scan at an outside institution that reported this nodule, however size was not specified, and those images are not available. No thyroid ultrasounds are available for review. Therefore, recommend thyroid USKorearef: J Am Coll Radiol. 2015 Feb;12(2): 143-50), unless performed elsewhere. 4. Dependent opacities in the lung bases favor atelectasis. 5/5: USKoreahyroid: >> 1.6 cm isthmus nodule technically meets criteria for fine-needle aspiration. The nodule is borderline in size for biopsy and 1 year follow-up ultrasound could also be considered. 5/6: Brain MRI s/p surgery: >> Expected postoperative changes of gross total resection of left frontal mass. Surrounding edema and mild mass effect are similar.  Micro Data:  5/3: SARS Cov2/Influenza A/Influenza B: Negative  Antimicrobials:  None   Interim history/subjective:   Tolerated extubation.  No acute events overnight.   Objective   Blood pressure 137/73, pulse 94, temperature 99.2 F (37.3 C), temperature source Oral, resp. rate 20, height 5' 2"  (1.575 m), weight 57 kg, SpO2 98 %.        Intake/Output Summary (Last 24 hours) at 07/01/2019 0923 Last data filed at 07/01/2019 0900 Gross per 24 hour  Intake 1573.07 ml  Output 2700 ml  Net -1126.93 ml  Filed Weights   06/29/19 0500 06/30/19 0500 07/01/19 0500  Weight: 57 kg 56.8 kg 57 kg    Examination: General: This is a 69 year old non-English-speaking female she is currently awake, interactive, able to follow commands HEENT normocephalic atraumatic EEG leads are in place pupils reactive Neuro: Awake, will follow  simple commands.  She has right sided weakness, she does not speak [place], however her sons at bedside and reports she still is confused Pulmonary: Clear to auscultation currently on room air Cardiac: Regular rate and rhythm Abdomen: Soft not tender no organomegaly Extremities: Warm dry brisk capillary refill GU clear yellow  Resolved Hospital Problem list   none  Assessment & Plan:  Sara Schroeder is a 69 yo F with h/o HTN, MDD who presented to [facility] with new, acute onset seizure found to have hemorrhagic L frontal lobe mass on head CT and spiculated pulmonary nodule on CT chest, c/f primary bronchogenic carcinoma with metastasis.   Intracranial Hemorrhage of L Frontal lobe  L frontal lobe hemorrhagic mass s/p resection on 5/6. + adenocarcinoma so w/ presence of lung nodule Lung possible primary -appreciate onc eval  Plan Cont current supportive care Post-op care per neuro surg Will need PET scan post op Await molecular markers  Seizure 2/2 brain mass -eeg as of 5/8 showing cortical dysfunction of the left frontal temporal region consistent with underlying structural abnormality no seizures or definitive epileptiform  activity Plan Continue serial neuro checks LTM per neurology Continue anticonvulsants  Acute toxic and metabolic encephalopathy. Suspect some of this reflects a postictal state superimposed on recent brain met Plan Continue serial neuro checks Treating seizure We will ask SLP to see for swallowing eval  Hypertension Plan Continue as needed labetalol  Fluid and electrolyte Imbalance  Plan Replace and recheck   Exophytic thyroid mass  Low TSH Plan PET scan outpatient  Best practice:  Diet: Tube feeds  Pain/Anxiety/Delirium protocol (if indicated): Fentanyl prn  ; if she swallows will move to hydrocodone  VAP protocol (if indicated): per protocol  DVT prophylaxis: SCDs GI prophylaxis: Protonix  Glucose control: none indicated  Mobility: Intubated, sedated,  bedrest Code Status: FULL Family Communication: 5/6 AM: Son at bedside.  Disposition: ICU will stay here for now, Once off LTM can move to neuro floor and ask internal medicine to assume care  My critical care time 32 minutes  Erick Colace ACNP-BC [place] Pager # [phone] OR # [phone] if no answer

## 2019-07-01 NOTE — Evaluation (Signed)
Physical Therapy Evaluation Patient Details Name: Sara Schroeder MRN: [id number] DOB: 12-08-1950 Today's Date: 07/01/2019   History of Present Illness  69 year old female with a past medical history significant for hypertension and depression.  The patient presented to the emergency room with new onset of seizure.  CT of the head on admission showed a left frontal lobe hemorrhagic mass with surrounding vasogenic edema and a 2 mm of rightward midline shift. CT chest also demonstrating right middle pulmonary nodule. Pt underwent L frontal craniotomy and tumor resection on 5/6, preliminary pathology showing metastatic adenocarcinoma.  Clinical Impression  Pt presents to PT with deficits in functional mobility, gait, balance, endurance, communication, motor control and planning, and likely cognition. Pt is able to follow 25-50% of commands provided through son who is agreeable to translate. Pt with signs of both expressive and receptive aphasias. Pt currently requires physical assistance to initiate most mobility as well as assistance throughout task to reduce falls risk. Pt with R weakness, UE>LE, with flaccid RUE during this session. Pt will benefit from continued acute PT POC to reduce falls risk and improve mobility quality. PT recommending high intensity inpatient PT as pt tolerated exercise well and was independent prior to admission.    Follow Up Recommendations CIR;Supervision/Assistance - 24 hour    Equipment Recommendations  Wheelchair (measurements PT);Wheelchair cushion (measurements PT);Hospital bed(if home today)    Recommendations for Other Services Rehab consult     Precautions / Restrictions Precautions Precautions: Fall Restrictions Weight Bearing Restrictions: No      Mobility  Bed Mobility Overal bed mobility: Needs Assistance Bed Mobility: Supine to Sit     Supine to sit: Max assist     General bed mobility comments: pt with poor initiation and ability to follow commands  to mobilize to edge of bed  Transfers Overall transfer level: Needs assistance Equipment used: 1 person hand held assist Transfers: Sit to/from Stand;Stand Pivot Transfers Sit to Stand: Mod assist;From elevated surface Stand pivot transfers: Mod assist;From elevated surface       General transfer comment: pt requires initiation to start stand. Once standing pt requries minA to maintain standing balance, but then requires PT physical assistance to direct and facilitate turn toward recliner  Ambulation/Gait                Stairs            Wheelchair Mobility    Modified Rankin (Stroke Patients Only)       Balance Overall balance assessment: Needs assistance Sitting-balance support: Bilateral upper extremity supported;Feet unsupported Sitting balance-Leahy Scale: Fair Sitting balance - Comments: minG at edge of bed with BUE support   Standing balance support: Bilateral upper extremity supported Standing balance-Leahy Scale: Poor Standing balance comment: min-mod A for static standing balance                             Pertinent Vitals/Pain Pain Assessment: Faces Faces Pain Scale: Hurts little more Pain Location: generalized Pain Descriptors / Indicators: Grimacing Pain Intervention(s): Monitored during session    Home Living Family/patient expects to be discharged to:: Private residence Living Arrangements: Spouse/significant other;Children Available Help at Discharge: Family;Available 24 hours/day Type of Home: House Home Access: Level entry     Home Layout: Two level;Able to live on main level with bedroom/bathroom Home Equipment: None      Prior Function Level of Independence: Independent         Comments: pt walks  around neighborhood with spouse often. Per son pt takes bird baths     Hand Dominance   Dominant Hand: Right    Extremity/Trunk Assessment   Upper Extremity Assessment Upper Extremity Assessment: RUE  deficits/detail RUE Deficits / Details: RUE flaccid during session, no AROM noted, PROM WFL    Lower Extremity Assessment Lower Extremity Assessment: Difficult to assess due to impaired cognition(ROM WFL in BLE, RLE weaker than LLE but no buckling noted)    Cervical / Trunk Assessment Cervical / Trunk Assessment: Normal  Communication   Communication: Prefers language other than English;Expressive difficulties;Receptive difficulties  Cognition Arousal/Alertness: Awake/alert Behavior During Therapy: Restless(in recliner at end of session) Overall Cognitive Status: Difficult to assess                                 General Comments: pt unable to verbalize during session, sometimes produces low pitched hums but otherwise no sound production. Pt follows ~25-50% of verbal motor commands, does better with PT initiation of task for pt to follow through. May have some apraxia present as pt initially appears to understand command to sit at edge of bed but then only moves legs short distance back and forth      General Comments General comments (skin integrity, edema, etc.): VSS, BP in 786V-672C systolic and stable compared to resting BP    Exercises     Assessment/Plan    PT Assessment Patient needs continued PT services  PT Problem List Decreased strength;Decreased activity tolerance;Decreased balance;Decreased mobility;Decreased coordination;Decreased cognition;Decreased knowledge of use of DME;Decreased safety awareness;Decreased knowledge of precautions       PT Treatment Interventions DME instruction;Gait training;Stair training;Functional mobility training;Therapeutic activities;Therapeutic exercise;Balance training;Neuromuscular re-education;Cognitive remediation;Patient/family education    PT Goals (Current goals can be found in the Care Plan section)  Acute Rehab PT Goals Patient Stated Goal: To improve mobility PT Goal Formulation: With family Time For Goal  Achievement: 07/15/19 Potential to Achieve Goals: Good Additional Goals Additional Goal #1: Pt will follow at least 75% of verbal commands given through translator    Frequency Min 4X/week   Barriers to discharge        Co-evaluation               AM-PAC PT "6 Clicks" Mobility  Outcome Measure Help needed turning from your back to your side while in a flat bed without using bedrails?: A Lot Help needed moving from lying on your back to sitting on the side of a flat bed without using bedrails?: Total Help needed moving to and from a bed to a chair (including a wheelchair)?: A Lot Help needed standing up from a chair using your arms (e.g., wheelchair or bedside chair)?: A Lot Help needed to walk in hospital room?: Total Help needed climbing 3-5 steps with a railing? : Total 6 Click Score: 9    End of Session   Activity Tolerance: Patient tolerated treatment well Patient left: in chair;with call bell/phone within reach;with chair alarm set;with family/visitor present Nurse Communication: Mobility status PT Visit Diagnosis: Unsteadiness on feet (R26.81);Other abnormalities of gait and mobility (R26.89);Muscle weakness (generalized) (M62.81);Apraxia (R48.2);Other symptoms and signs involving the nervous system (R29.898)    Time: 1458-1530 PT Time Calculation (min) (ACUTE ONLY): 32 min   Charges:   PT Evaluation $PT Eval Moderate Complexity: 1 Mod PT Treatments $Therapeutic Activity: 8-22 mins        Zenaida Niece, PT, DPT Acute  Rehabilitation Pager: [phone]   Zenaida Niece 07/01/2019, 3:46 PM

## 2019-07-01 NOTE — Evaluation (Signed)
Clinical/Bedside Swallow Evaluation Patient Details  Name: Sara Schroeder MRN: [id number] Date of Birth: Nov 28, 1950  Today's Date: 07/01/2019 Time: SLP Start Time (ACUTE ONLY): 1213 SLP Stop Time (ACUTE ONLY): 1236 SLP Time Calculation (min) (ACUTE ONLY): 23 min  Past Medical History:  Past Medical History:  Diagnosis Date  . Anxiety   . Hypertension    Past Surgical History:  Past Surgical History:  Procedure Laterality Date  . APPLICATION OF CRANIAL NAVIGATION N/A 06/29/2019   Procedure: APPLICATION OF CRANIAL NAVIGATION;  Surgeon: Sara Part, MD;  Location: [place];  Service: Neurosurgery;  Laterality: N/A;  . CRANIOTOMY Left 06/29/2019   Procedure: LEFT CRANIOTOMY FOR TUMOR EXCISION;  Surgeon: Sara Part, MD;  Location: [place];  Service: Neurosurgery;  Laterality: Left;   HPI:  69 yo F presented to [facility] with new onset seizure. 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  Pt underwent L frontal craniotomy for tumor resection on 06/29/19.   Intubated due to recurring seizures.  Pt was intubated from 5/3-5/7.     Assessment / Plan / Recommendation Clinical Impression  Pt was seen for a bedside swallow evaluation.  [employer] audio interpreter services were utilized (interpreter [phone]) for this evaluation.  Pt's son was present for the later half of this session.  Pt did not verbalize and she was unable to follow commands in order to complete oral mechanism evaluation.  Oral cavity was noted to be mildly dry to limited observation.  Pt consumed trials of ice chips, thin liquid, puree, and regular solids.  Pt was noted to have a strong cough x1 prior to PO trials.  She required extra time to achieve labial closure around the spoon and straw.  Straw sips and independent cup sips were noted to be very small.  Mastication of regular solids was mildly prolonged and the pt was observed to have a delayed cough following 1/4 trials.  She was additionally noted to be impulsive with regular solid intake.   Suspect prolonged AP transport and possible delayed swallow initiation with liquid trials; however, no overt s/sx of aspiration were observed.  Recommend initiation of Dysphagia 1 (puree) solids and thin liquids with medications administered crushed in puree and strict adherence to compensatory strategies (listed below).  Pt will benefit from full assistance with PO intake.  Additionally recommend a comprehensive cognitive-linguistic evaluation given tumor resection and suspected aphasia.  MD please order if you feel that this is appropriate.    SLP Visit Diagnosis: Dysphagia, unspecified (R13.10)    Aspiration Risk  Mild aspiration risk    Diet Recommendation Dysphagia 1 (Puree);Thin liquid   Liquid Administration via: Cup;Straw Medication Administration: Crushed with puree Supervision: Staff to assist with self feeding;Full supervision/cueing for compensatory strategies Compensations: Minimize environmental distractions;Slow rate;Small sips/bites Postural Changes: Seated upright at 90 degrees    Other  Recommendations Oral Care Recommendations: Oral care BID;Staff/trained caregiver to provide oral care   Follow up Recommendations Inpatient Rehab      Frequency and Duration min 2x/week  2 weeks       Prognosis Prognosis for Safe Diet Advancement: Good Barriers to Reach Goals: Language deficits      Swallow Study   General HPI: 69 yo F presented to [facility] with new onset seizure. 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  Pt underwent L frontral craniotomy for tumor resection on 06/29/19.   Intubated due to recurring seizures.  Pt was intubated from 5/3-5/7.   Type of Study: Bedside Swallow Evaluation  Previous Swallow Assessment: None  Diet Prior to this Study: NPO Temperature Spikes Noted: Yes Respiratory Status: Room air History of Recent Intubation: Yes Length of Intubations (days): 3 days Date extubated: 06/30/19 Behavior/Cognition: Cooperative;Impulsive;Lethargic/Drowsy Oral  Cavity Assessment: Dry Oral Care Completed by SLP: No Oral Cavity - Dentition: Adequate natural dentition Vision: Functional for self-feeding Self-Feeding Abilities: Able to feed self;Needs set up Patient Positioning: Upright in bed Baseline Vocal Quality: Not observed Volitional Cough: Strong Volitional Swallow: Unable to elicit    Oral/Motor/Sensory Function Overall Oral Motor/Sensory Function: Other (comment)(Pt unable to follow commands )   Ice Chips Ice chips: Within functional limits Presentation: Spoon   Thin Liquid Thin Liquid: Within functional limits Presentation: Spoon;Straw    Nectar Thick Nectar Thick Liquid: Not tested   Honey Thick Honey Thick Liquid: Not tested   Puree Puree: Within functional limits   Solid     Solid: Impaired Presentation: Self Fed Oral Phase Functional Implications: Prolonged oral transit Pharyngeal Phase Impairments: Cough - Delayed     Sara Schroeder M.S., CCC-SLP Acute Rehabilitation Services Office: [phone]  Sara Schroeder Sara Schroeder 07/01/2019,12:52 PM

## 2019-07-01 NOTE — Procedures (Addendum)
Patient Name:Sara Schroeder AJL:[id number] Epilepsy Attending:Monnie Anspach [name] [name] Referring Physician/Provider: Dr. Emelda Brothers Duration:06/30/2019 [phone] to 07/01/2019 1223  Patient history:69 year old female presented with seizure-like episode and was found to have left frontal lobe hemorrhagic mass. EEG evaluate for seizures.  Level of alertness: awake, asleep  AEDs during EEG study:Keppra  Technical aspects: This EEG study was done with scalp electrodes positioned according to the 10-20 International system of electrode placement. Electrical activity was acquired at a sampling rate of 500Hz  and reviewed with a high frequency filter of 70Hz  and a low frequency filter of 1Hz . EEG data were recorded continuously and digitally stored.  Description: No clear posterior dominant rhythm was seen. Sleep was characterized by vertex waves, sleep spindles (12-14hz ), maximal frontocentral region. EEG showed continuous generalized and lateralized left frontotemporal region  3-6 Hz theta-delta slowing.  Hyperventilation and photic stimulation were not performed.  Abnormality -Continuous slow, generalized and lateralized left frontotemporal region  IMPRESSION: This study suggestive of cortical dysfunction in left frontotemporal region consistent with underlying structural abnormality. Additionally, there is evidence of moderate diffuse encephalopathy, nonspecific etiology.No seizures ordefiniteepileptiform discharges were seen throughout the recording.  Charnele Semple [name] [name]

## 2019-07-01 NOTE — Progress Notes (Signed)
LTM EEG discontinued - no skin breakdown at unhook.   

## 2019-07-02 DIAGNOSIS — R918 Other nonspecific abnormal finding of lung field: Secondary | ICD-10-CM | POA: Diagnosis not present

## 2019-07-02 DIAGNOSIS — C7931 Secondary malignant neoplasm of brain: Principal | ICD-10-CM

## 2019-07-02 DIAGNOSIS — J9601 Acute respiratory failure with hypoxia: Secondary | ICD-10-CM | POA: Diagnosis not present

## 2019-07-02 DIAGNOSIS — C801 Malignant (primary) neoplasm, unspecified: Secondary | ICD-10-CM | POA: Diagnosis not present

## 2019-07-02 DIAGNOSIS — I612 Nontraumatic intracerebral hemorrhage in hemisphere, unspecified: Secondary | ICD-10-CM | POA: Diagnosis not present

## 2019-07-02 LAB — CBC
HCT: 33 % — ABNORMAL LOW (ref 36.0–46.0)
Hemoglobin: 10.8 g/dL — ABNORMAL LOW (ref 12.0–15.0)
MCH: 30.4 pg (ref 26.0–34.0)
MCHC: 32.7 g/dL (ref 30.0–36.0)
MCV: 93 fL (ref 80.0–100.0)
Platelets: 189 10*3/uL (ref 150–400)
RBC: 3.55 MIL/uL — ABNORMAL LOW (ref 3.87–5.11)
RDW: 13.2 % (ref 11.5–15.5)
WBC: 14.5 10*3/uL — ABNORMAL HIGH (ref 4.0–10.5)
nRBC: 0 % (ref 0.0–0.2)

## 2019-07-02 LAB — PHOSPHORUS: Phosphorus: 3.3 mg/dL (ref 2.5–4.6)

## 2019-07-02 LAB — BASIC METABOLIC PANEL
Anion gap: 11 (ref 5–15)
BUN: 21 mg/dL (ref 8–23)
CO2: 25 mmol/L (ref 22–32)
Calcium: 9.2 mg/dL (ref 8.9–10.3)
Chloride: 101 mmol/L (ref 98–111)
Creatinine, Ser: 0.7 mg/dL (ref 0.44–1.00)
GFR calc Af Amer: >60 mL/min (ref >60)
GFR calc non Af Amer: >60 mL/min (ref >60)
Glucose, Bld: 147 mg/dL — ABNORMAL HIGH (ref 70–99)
Potassium: 3.8 mmol/L (ref 3.5–5.1)
Sodium: 137 mmol/L (ref 135–145)

## 2019-07-02 LAB — GLUCOSE, CAPILLARY
Glucose-Capillary: 141 mg/dL — ABNORMAL HIGH (ref 70–99)
Glucose-Capillary: 140 mg/dL — ABNORMAL HIGH (ref 70–99)
Glucose-Capillary: 130 mg/dL — ABNORMAL HIGH (ref 70–99)
Glucose-Capillary: 150 mg/dL — ABNORMAL HIGH (ref 70–99)

## 2019-07-02 LAB — MAGNESIUM: Magnesium: 2 mg/dL (ref 1.7–2.4)

## 2019-07-02 MED ORDER — FLEET ENEMA 7-19 GM/118ML RE ENEM
1.0000 | ENEMA | Freq: Once | RECTAL | Status: AC
  Administered 2019-07-02: 1 via RECTAL
  Filled 2019-07-02: qty 1

## 2019-07-02 MED ORDER — DOCUSATE SODIUM 50 MG/5ML PO LIQD
100.0000 mg | Freq: Two times a day (BID) | ORAL | Status: DC
  Administered 2019-07-02 – 2019-07-07 (×11): 100 mg via ORAL
  Filled 2019-07-02 (×10): qty 10

## 2019-07-02 NOTE — Progress Notes (Signed)
Subjective: The patient is seen and examined today.  A family member was at the bedside and I also spoke to her son by phone during the visit.  She is slowly improving after her brain surgery.  She denied having any current chest pain, shortness of breath, cough or hemoptysis.  She denied having any fever or chills.  Objective: Vital signs in last 24 hours: Temp:  [97.8 F (36.6 C)-98.9 F (37.2 C)] 98.4 F (36.9 C) (05/09 0800) Pulse Rate:  [59-96] 80 (05/09 0900) Resp:  [14-27] 18 (05/09 1000) BP: (111-149)/(58-100) 111/61 (05/09 1000) SpO2:  [92 %-100 %] 98 % (05/09 1000) Weight:  [126 lb 12.2 oz (57.5 kg)] 126 lb 12.2 oz (57.5 kg) (05/09 0500)  Intake/Output from previous day: 05/08 0701 - 05/09 0700 In: 1426.2 [I.V.:1018.4; IV Piggyback:407.8] Out: 450 [Urine:450] Intake/Output this shift: Total I/O In: 235.7 [I.V.:135.7; IV Piggyback:100] Out: -   General appearance: alert, cooperative, fatigued and no distress Resp: clear to auscultation bilaterally Cardio: regular rate and rhythm, S1, S2 normal, no murmur, click, rub or gallop GI: soft, non-tender; bowel sounds normal; no masses,  no organomegaly Extremities: extremities normal, atraumatic, no cyanosis or edema  Lab Results:  Recent Labs    07/01/19 0554 07/02/19 0619  WBC 16.1* 14.5*  HGB 10.1* 10.8*  HCT 31.2* 33.0*  PLT 156 189   BMET Recent Labs    07/01/19 0554 07/02/19 0619  NA 138 137  K 3.4* 3.8  CL 103 101  CO2 23 25  GLUCOSE 124* 147*  BUN 17 21  CREATININE 0.64 0.70  CALCIUM 8.8* 9.2    Studies/Results: Overnight EEG with video  Result Date: 07/01/2019 Lora Havens, MD     07/02/2019  9:21 AM Patient Name:Sara Schroeder CLE:[id number] Epilepsy Attending:Priyanka [name] [name] Referring Physician/Provider: Dr. Emelda Brothers Duration:06/30/2019 0950 to 07/01/2019 1223  Patient history:69 year old female presented with seizure-like episode and was found to have left frontal lobe hemorrhagic mass.  EEG evaluate for seizures.  Level of alertness: awake, asleep  AEDs during EEG study:Keppra  Technical aspects: This EEG study was done with scalp electrodes positioned according to the 10-20 International system of electrode placement. Electrical activity was acquired at a sampling rate of 500Hz  and reviewed with a high frequency filter of 70Hz  and a low frequency filter of 1Hz . EEG data were recorded continuously and digitally stored.  Description: No clear posterior dominant rhythm was seen. Sleep was characterized by vertex waves, sleep spindles (12-14hz ), maximal frontocentral region. EEG showed continuous generalized and lateralized left frontotemporal region  3-6 Hz theta-delta slowing.  Hyperventilation and photic stimulation were not performed.  Abnormality -Continuous slow, generalized and lateralized left frontotemporal region  IMPRESSION: This study suggestive of cortical dysfunction in left frontotemporal region consistent with underlying structural abnormality. Additionally, there is evidence of moderate diffuse encephalopathy, nonspecific etiology.No seizures ordefiniteepileptiform discharges were seen throughout the recording.  Priyanka [name] [name]    Medications: I have reviewed the patient's current medications.  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  She underwent surgical resection of the solitary large brain metastasis.  The only other lesion seen on this patient is a right upper lobe nodule.  She will need a PET scan on outpatient basis to confirm the absence of any other metastatic disease.  I discussed with the patient and her son the possible future treatment options including sending her tissue block for molecular studies  and PD-L1 expression. If the PET scan showed no other lesion besides the right upper lobe nodule, this could be considered for surgical resection  or SBRT in addition systemic therapy with either oral targeted therapy if the patient has actionable mutation or several cycles of platinum based chemotherapy in combination with immunotherapy if she has no actionable mutations. The patient is followed by Dr. Marin Olp and he is providing her with great care.  She will follow up with him after discharge and give further recommendation. Thank you for taking good care of Sara Schroeder.  Please call if you have any questions.  Disclaimer: This note was dictated with voice recognition software. Similar sounding words can inadvertently be transcribed and may be missed upon review.    LOS: 5 days    [name] 07/02/2019

## 2019-07-02 NOTE — Progress Notes (Signed)
NAMEBaylea Schroeder, MRN:  [id number], DOB:  04-17-1950, LOS: 5 ADMISSION DATE:  06/27/2019, CONSULTATION DATE:  06/26/19 REFERRING MD:  [facility], CHIEF COMPLAINT:  Seizure  Brief History   Sara Schroeder is a 69 yo F with h/o HTN, anxiety, and MDD who presented with seizure and was found to have a large hemorrhagic L frontal lobe mass. Briefly, she had new onset seizure at [facility] on 5/3 characterized by R-sided convulsions and twitching. EMS arrived and she had another seizure in transit. She was given Ativan and Keppra at [facility] ED. Vitals on presentation significant for elevated BP of 170s/90s, HR 100s, T37. Labs were unremarkable except WBC of 17.2. CT head showed 2.7x2.0x2.8cm hemorrhagic mass in the L frontal lobe. Pt was intubated for airway protection and transferred to [facility] ICU. Pt's son and granddaughter deny history of seizures.   Past Medical History  HTN Depression Anxiety  Significant Hospital Events   5/3: Multiple seizures, intubated for airway protection.  5/3: L frontal hemorrhagic mass on CT head  5/4: Transferred to [facility] Neuro ICU 5/6 Left craniectomy, tumor resection. Prelim path cw adenoca 5/7 Extubated, remains on LTM  Consults:  Neurosurgery (5/4)  Procedures:  5/3 >> 5/7 ETT   Significant Diagnostic Tests:  5/3 CT Head noncon> L frontal lobe hemorrhage 2.7x2.0 x 2.8 cm with surrounding edema. 62m rightward midline shift. No herniation.  5/3 CXR> ETT 122mabove carina. Enteric tube above JE junction. Recommend retraction of ETT 2-3 cm, advancement of Enteric tube 8 cm. 5/4 CXR: ETT 2-3 cm above carine. OG tube in stomach.  5/4 MRI brain: Solitary hemorrhagic mass left frontal lobe measuring 2.2 x 2.3 x 2.8 cm. Extensive vasogenic edema. 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 Primary brain tumor not likely. 5/4: CT Chest, Abd, Pelvis >> 1. Spiculated right middle lobe pulmonary nodule measuring 11 x 11 x 10 mm, only minimally increased in size from prior exam.  This is suspicious for primary bronchogenic malignancy. Evaluation of the hila is limited in the absence of IV contrast, allowing for this limitation, no evidence of nodal metastasis. 2. No other evidence of metastatic disease or primary malignancy in the chest, abdomen, or pelvis. 3. Exophytic 16 mm nodule from the thyroid isthmus, patient had previous thyroid uptake and scan at an outside institution that reported this nodule, however size was not specified, and those images are not available. No thyroid ultrasounds are available for review. Therefore, recommend thyroid USKorearef: J Am Coll Radiol. 2015 Feb;12(2): 143-50), unless performed elsewhere. 4. Dependent opacities in the lung bases favor atelectasis. 5/5: USKoreahyroid: >> 1.6 cm isthmus nodule technically meets criteria for fine-needle aspiration. The nodule is borderline in size for biopsy and 1 year follow-up ultrasound could also be considered. 5/6: Brain MRI s/p surgery: >> Expected postoperative changes of gross total resection of left frontal mass. Surrounding edema and mild mass effect are similar.  Micro Data:  5/3: SARS Cov2/Influenza A/Influenza B: Negative  Antimicrobials:  None   Interim history/subjective:  Looking better  Objective   Blood pressure 138/73, pulse 80, temperature 98.4 F (36.9 C), temperature source Oral, resp. rate (Abnormal) 25, height _0  (1.575 m), weight 57.5 kg, SpO2 96 %.        Intake/Output Summary (Last 24 hours) at 07/02/2019 0949 Last data filed at 07/02/2019 0900 Gross per 24 hour  Intake 1374.44 ml  Output 450 ml  Net 924.44 ml   [employer] 06/30/19  0500 07/01/19 0500 07/02/19 0500  Weight: 56.8 kg 57 kg 57.5 kg    Examination: General: Non-English-speaking 69 year old female she is resting in bed and in no acute distress this morning. HEENT normocephalic atraumatic no jugular venous distention pupils equal reactive Pulmonary: Clear to auscultation currently room  air Cardiac: Regular rate and rhythm Abdomen: Soft, not tender, but reporting to family discomfort Extremities: Warm and dry Neuro: Moving all extremities, remains confused, slightly impulsive but more appropriate today per family.  Right-sided weakness is gradually improving. GU: Clear yellow.  Resolved Hospital Problem list   none  Assessment & Plan:  Sara Schroeder is a 69 yo F with h/o HTN, MDD who presented to [facility] with new, acute onset seizure found to have hemorrhagic L frontal lobe mass on head CT and spiculated pulmonary nodule on CT chest, c/f primary bronchogenic carcinoma with metastasis.   Intracranial Hemorrhage of L Frontal lobe  L frontal lobe hemorrhagic mass s/p resection on 5/6. + adenocarcinoma so w/ presence of lung nodule Lung possible primary -appreciate onc eval  Plan Outpatient PET scan Decadron per surgery  Seizure 2/2 brain mass -eeg as of 5/8 showing cortical dysfunction of the left frontal temporal region consistent with underlying structural abnormality no seizures or definitive epileptiform  activity Plan Continue anticonvulsants  Acute toxic and metabolic encephalopathy. Suspect some of this reflects a postictal state superimposed on recent brain met Plan Continue serial neuro checks No change in anticonvulsants DC fentanyl Will ask CIR to see  Abdominal pain Suspect constipation Plan Regular diet Fleet Enema  Hypertension Plan As needed hydralazine  Fluid and electrolyte Imbalance  Plan Replace and recheck PRN  Exophytic thyroid mass  Low TSH Plan PET scan as out-pt   Best practice:  Diet: Tube feeds  Pain/Anxiety/Delirium protocol (if indicated): Fentanyl prn  ; if she swallows will move to hydrocodone  VAP protocol (if indicated): per protocol  DVT prophylaxis: SCDs GI prophylaxis: Protonix  Glucose control: none indicated  Mobility: Intubated, sedated, bedrest Code Status: FULL Family Communication: 5/6 AM: Son at bedside.    Disposition:neuro med/surg   Erick Colace ACNP-BC [place] Pager # [phone] OR # [phone] if no answer

## 2019-07-02 NOTE — Progress Notes (Signed)
Inpatient Rehab Admissions Coordinator Note:   Per PT recommendation, pt was screened for CIR candidacy by Gayland Curry, MS, CCC-SLP.  At this time we are recommending Inpatient Rehab consult.  AC will contact MD to request consult order.  Please contact me with questions.   Gayland Curry, [place], [place] Admissions Coordinator [phone]

## 2019-07-02 NOTE — Progress Notes (Signed)
Subjective: No seizures, NAEs o/n.  Objective: Vital signs in last 24 hours: Temp:  [97.8 F (36.6 C)-98.9 F (37.2 C)] 98.4 F (36.9 C) (05/09 0800) Pulse Rate:  [59-96] 80 (05/09 0900) Resp:  [14-27] 18 (05/09 1000) BP: (111-149)/(58-100) 111/61 (05/09 1000) SpO2:  [92 %-100 %] 98 % (05/09 1000) Weight:  [57.5 kg] 57.5 kg (05/09 0500)  Intake/Output from previous day: 05/08 0701 - 05/09 0700 In: 1426.2 [I.V.:1018.4; IV Piggyback:407.8] Out: 450 [Urine:450] Intake/Output this shift: Total I/O In: 235.7 [I.V.:135.7; IV Piggyback:100] Out: -   Awake, alert, makes sounds but no words. FC x 4.  Antigravity RUE, 2/5 in hand, 3/5 RLE. Incision c/d  Lab Results: Recent Labs    07/01/19 0554 07/02/19 0619  WBC 16.1* 14.5*  HGB 10.1* 10.8*  HCT 31.2* 33.0*  PLT 156 189   BMET Recent Labs    07/01/19 0554 07/02/19 0619  NA 138 137  K 3.4* 3.8  CL 103 101  CO2 23 25  GLUCOSE 124* 147*  BUN 17 21  CREATININE 0.64 0.70  CALCIUM 8.8* 9.2    Studies/Results: Overnight EEG with video  Result Date: 07/01/2019 Lora Havens, MD     07/02/2019  9:21 AM Patient Name:Sara Schroeder VEH:[id number] Epilepsy Attending:Priyanka [name] Schroeder Referring Physician/Provider: Dr. Emelda Brothers Duration:06/30/2019 [phone] to 07/01/2019 1223  Patient history:69 year old female presented with seizure-like episode and was found to have left frontal lobe hemorrhagic mass. EEG evaluate for seizures.  Level of alertness: awake, asleep  AEDs during EEG study:Keppra  Technical aspects: This EEG study was done with scalp electrodes positioned according to the 10-20 International system of electrode placement. Electrical activity was acquired at a sampling rate of 500Hz  and reviewed with a high frequency filter of 70Hz  and a low frequency filter of 1Hz . EEG data were recorded continuously and digitally stored.  Description: No clear posterior dominant rhythm was seen. Sleep was characterized by  vertex waves, sleep spindles (12-14hz ), maximal frontocentral region. EEG showed continuous generalized and lateralized left frontotemporal region  3-6 Hz theta-delta slowing.  Hyperventilation and photic stimulation were not performed.  Abnormality -Continuous slow, generalized and lateralized left frontotemporal region  IMPRESSION: This study suggestive of cortical dysfunction in left frontotemporal region consistent with underlying structural abnormality. Additionally, there is evidence of moderate diffuse encephalopathy, nonspecific etiology.No seizures ordefiniteepileptiform discharges were seen throughout the recording.  Sara Schroeder    Assessment/Plan: S/p crani for excision of left frontal brain mass - slow dex taper - cont keppra - awaiting downgrade bed   Vallarie Mare 07/02/2019, 10:18 AM

## 2019-07-02 NOTE — Progress Notes (Signed)
Inpatient Rehab Admissions:  Inpatient Rehab Consult received.  I met with pt at the bedside for rehabilitation assessment and to discuss goals and expectations of an inpatient rehab admission.  Husband was also present.  Shortly after entry into room, son, [name] called. Husband gave me the phone to talk to him.  I explained CIR goals and expectations to son. He acknowledged understanding of goals and expectations and is interested in CIR.  Will continue to follow pt's medical workup and progress with acute care therapies to determine potential admission for CIR.  Signed: Gayland Curry, [place], [place] Admissions Coordinator [phone]

## 2019-07-03 ENCOUNTER — Other Ambulatory Visit: Payer: Self-pay | Admitting: Anatomic Pathology & Clinical Pathology

## 2019-07-03 DIAGNOSIS — I612 Nontraumatic intracerebral hemorrhage in hemisphere, unspecified: Secondary | ICD-10-CM | POA: Diagnosis not present

## 2019-07-03 DIAGNOSIS — C7931 Secondary malignant neoplasm of brain: Secondary | ICD-10-CM | POA: Diagnosis not present

## 2019-07-03 DIAGNOSIS — J9601 Acute respiratory failure with hypoxia: Secondary | ICD-10-CM

## 2019-07-03 LAB — CBC
HCT: 32.5 % — ABNORMAL LOW (ref 36.0–46.0)
Hemoglobin: 10.7 g/dL — ABNORMAL LOW (ref 12.0–15.0)
MCH: 30.7 pg (ref 26.0–34.0)
MCHC: 32.9 g/dL (ref 30.0–36.0)
MCV: 93.4 fL (ref 80.0–100.0)
Platelets: 221 10*3/uL (ref 150–400)
RBC: 3.48 MIL/uL — ABNORMAL LOW (ref 3.87–5.11)
RDW: 13.2 % (ref 11.5–15.5)
WBC: 14 10*3/uL — ABNORMAL HIGH (ref 4.0–10.5)
nRBC: 0 % (ref 0.0–0.2)

## 2019-07-03 LAB — BASIC METABOLIC PANEL
Anion gap: 8 (ref 5–15)
BUN: 25 mg/dL — ABNORMAL HIGH (ref 8–23)
CO2: 27 mmol/L (ref 22–32)
Calcium: 8.9 mg/dL (ref 8.9–10.3)
Chloride: 104 mmol/L (ref 98–111)
Creatinine, Ser: 0.65 mg/dL (ref 0.44–1.00)
GFR calc Af Amer: >60 mL/min (ref >60)
GFR calc non Af Amer: >60 mL/min (ref >60)
Glucose, Bld: 165 mg/dL — ABNORMAL HIGH (ref 70–99)
Potassium: 3.9 mmol/L (ref 3.5–5.1)
Sodium: 139 mmol/L (ref 135–145)

## 2019-07-03 LAB — GLUCOSE, CAPILLARY: Glucose-Capillary: 114 mg/dL — ABNORMAL HIGH (ref 70–99)

## 2019-07-03 MED ORDER — ADULT MULTIVITAMIN W/MINERALS CH
1.0000 | ORAL_TABLET | Freq: Every day | ORAL | Status: DC
  Administered 2019-07-04 – 2019-07-07 (×4): 1 via ORAL
  Filled 2019-07-03 (×5): qty 1

## 2019-07-03 MED ORDER — PRO-STAT SUGAR FREE PO LIQD
30.0000 mL | Freq: Two times a day (BID) | ORAL | Status: DC
  Administered 2019-07-03 – 2019-07-07 (×8): 30 mL via ORAL
  Filled 2019-07-03 (×9): qty 30

## 2019-07-03 MED ORDER — DEXAMETHASONE SODIUM PHOSPHATE 4 MG/ML IJ SOLN
4.0000 mg | Freq: Two times a day (BID) | INTRAMUSCULAR | Status: DC
  Administered 2019-07-03: 4 mg via INTRAVENOUS
  Filled 2019-07-03: qty 1

## 2019-07-03 MED ORDER — ENSURE ENLIVE PO LIQD
237.0000 mL | Freq: Three times a day (TID) | ORAL | Status: DC
  Administered 2019-07-03 – 2019-07-07 (×12): 237 mL via ORAL

## 2019-07-03 MED ORDER — HEPARIN SODIUM (PORCINE) 5000 UNIT/ML IJ SOLN
5000.0000 [IU] | Freq: Three times a day (TID) | INTRAMUSCULAR | Status: DC
  Administered 2019-07-03 – 2019-07-06 (×11): 5000 [IU] via SUBCUTANEOUS
  Filled 2019-07-03 (×10): qty 1

## 2019-07-03 MED ORDER — MENTHOL 3 MG MT LOZG
1.0000 | LOZENGE | OROMUCOSAL | Status: DC | PRN
  Administered 2019-07-03 – 2019-07-04 (×2): 3 mg via ORAL
  Filled 2019-07-03: qty 9

## 2019-07-03 NOTE — Progress Notes (Signed)
Nutrition Follow-up  RD working remotely.  DOCUMENTATION CODES:   Not applicable  INTERVENTION:   - Ensure Enlive po TID, each supplement provides 350 kcal and 20 grams of protein  - Pro-stat 30 ml po BID, each supplement provides 100 kcal and 15 grams of protein  - MVI with minerals daily  - Encourage adequate PO intake  NUTRITION DIAGNOSIS:   Inadequate oral intake related to inability to eat as evidenced by NPO status.  Progressing, diet advanced to dysphagia 1 with thin liquids  GOAL:   Patient will meet greater than or equal to 90% of their needs  Progressing  MONITOR:   PO intake, Supplement acceptance, Diet advancement, Labs, Weight trends, Skin, I & O's  REASON FOR ASSESSMENT:   Consult, Ventilator Enteral/tube feeding initiation and management  ASSESSMENT:   Pt with PMH of HTN, anxiety, and depression admitted s/p seizure and dx with large L frontal mass.  5/06 - s/p left craniotomy for tumor resection 5/07 - extubated 5/08 - s/p BSE with diet advanced to dysphagia 1, thin liquids  Pt with brain lesion with preliminary pathology showing metastatic adenocarcinoma and spiculated right middle lobe lung mass. Awaiting final pathology. Pt will need PET scan as outpatient. Possible d/c to CIR.  No meal completions recorded since diet advanced. RD will order oral nutrition supplements to aid pt in meeting kcal and protein needs.  Weight up 8 lbs since admission. No edema noted in RN assessment.  Medications reviewed and include: decadron, colace, Keppra  Labs reviewed. CBG's: 130-150 x 24 hours  Diet Order:   Diet Order            DIET - DYS 1 Room service appropriate? Yes with Assist; Fluid consistency: Thin  Diet effective now              EDUCATION NEEDS:   No education needs have been identified at this time  Skin:  Skin Assessment: Skin Integrity Issues: Incisions: head  Last BM:  06/27/19 small type 6  Height:   Ht Readings from  Last 1 Encounters:  06/27/19 5\' 2"  (1.575 m)    Weight:   Wt Readings from Last 1 Encounters:  07/03/19 61.6 kg    Ideal Body Weight:  50 kg  BMI:  Body mass index is 24.84 kg/m.  Estimated Nutritional Needs:   Kcal:  1500-1700  Protein:  75-100 grams  Fluid:  >1.5 L/day    Gaynell Face, MS, RD, LDN Inpatient Clinical Dietitian Pager: [phone] Weekend/After Hours: [phone]

## 2019-07-03 NOTE — Consult Note (Signed)
Physical Medicine and Rehabilitation Consult Reason for Consult: Decreased functional mobility seizure and aphasia Referring Physician: [facility]   HPI: Sara Schroeder is a 69 y.o. right-handed female with history of anxiety and hypertension as well as history of pulmonary nodule January 2020.  Presented 06/27/2019 with reported seizure and aphasia.  Per chart review independent prior to admission living with spouse and family.  Two-level home bed and bath on main level.  Cranial CT scan showed left frontal lobe hemorrhagic mass with surrounding vasogenic edema and a 2 mm of rightward midline shift.  MRI solitary 2.6 cm left lateral frontal mass compatible with hemorrhagic neoplasm.  CT of chest abdomen pelvis showed spiculated right middle lobe pulmonary nodule measuring 11 x 11 x 10 mm.  No other evidence of metastatic disease.  Patient underwent left frontal craniotomy tumor resection 06/29/2019 per Dr. Venetia Constable.  She was extubated 06/30/2019.  EEG 06/30/2019 showed no seizure activity and currently maintained on Keppra for seizure prophylaxis.  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  Decadron protocol as indicated.  Tolerating a dysphagia #1 thin liquid diet.   Review of Systems  Unable to perform ROS: Language   Past Medical History:  Diagnosis Date  . Anxiety   . Hypertension    Past Surgical History:  Procedure Laterality Date  . APPLICATION OF CRANIAL NAVIGATION N/A 06/29/2019   Procedure: APPLICATION OF CRANIAL NAVIGATION;  Surgeon: Judith Part, MD;  Location: [facility];  Service: Neurosurgery;  Laterality: N/A;  . CRANIOTOMY Left 06/29/2019   Procedure: LEFT CRANIOTOMY FOR TUMOR EXCISION;  Surgeon: Judith Part, MD;  Location: [facility];  Service: Neurosurgery;  Laterality: Left;   History reviewed. No pertinent family history. Social History:  reports that she has never smoked. She has never used smokeless tobacco.  She reports that she does not drink alcohol or use drugs. Allergies:  Allergies  Allergen Reactions  . Lisinopril Other (See Comments)    Per med list from [facility]    Medications Prior to Admission  Medication Sig Dispense Refill  . dicyclomine (BENTYL) 20 MG tablet Take 20 mg by mouth 4 (four) times daily as needed for spasms (abdominal pain).    Marland Kitchen FLUoxetine (PROZAC) 20 MG capsule Take 20 mg by mouth daily.    . hydrochlorothiazide (HYDRODIURIL) 25 MG tablet Take 25 mg by mouth daily.    Marland Kitchen LORazepam (ATIVAN) 0.5 MG tablet Take 0.25 mg by mouth at bedtime as needed for anxiety.    . melatonin 1 MG TABS tablet Take 1-3 mg by mouth at bedtime as needed (sleep).    . methylcellulose (CITRUCEL) oral powder Take 1 packet by mouth daily. Mix with 8oz of water or juice    . omeprazole (PRILOSEC) 20 MG capsule Take 20 mg by mouth daily.    . pravastatin (PRAVACHOL) 20 MG tablet Take 20 mg by mouth every evening.    . propranolol (INDERAL) 10 MG tablet Take 10 mg by mouth 2 (two) times daily.    . traZODone (DESYREL) 50 MG tablet Take 100 mg by mouth at bedtime.      Home: Home Living Family/patient expects to be discharged to:: Private residence Living Arrangements: Spouse/significant other Available Help at Discharge: Family, Available 24 hours/day Type of Home: House Home Access: Level entry Home Layout: Two level, Able to live on main level with bedroom/bathroom Alternate Level Stairs-Number of Steps: flight Bathroom Shower/Tub: Tub/shower unit  Bathroom Toilet: Programmer, systems: Yes Home Equipment: None  Lives With: Spouse(sons, granddaughter, daughter, and son-in-law live close by)  Functional History: Prior Function Level of Independence: Independent Comments: pt walks around neighborhood with spouse often. Per son pt takes bird baths Functional Status:  Mobility: Bed Mobility Overal bed mobility: Needs Assistance Bed Mobility: Supine  to Sit Supine to sit: Max assist General bed mobility comments: pt with poor initiation and ability to follow commands to mobilize to edge of bed Transfers Overall transfer level: Needs assistance Equipment used: 1 person hand held assist Transfers: Sit to/from Stand, Stand Pivot Transfers Sit to Stand: Mod assist, From elevated surface Stand pivot transfers: Mod assist, From elevated surface General transfer comment: pt requires initiation to start stand. Once standing pt requries minA to maintain standing balance, but then requires PT physical assistance to direct and facilitate turn toward recliner      ADL:    Cognition: Cognition Overall Cognitive Status: Difficult to assess Orientation Level: Other (comment) Cognition Arousal/Alertness: Awake/alert Behavior During Therapy: Restless(in recliner at end of session) Overall Cognitive Status: Difficult to assess General Comments: pt unable to verbalize during session, sometimes produces low pitched hums but otherwise no sound production. Pt follows ~25-50% of verbal motor commands, does better with PT initiation of task for pt to follow through. May have some apraxia present as pt initially appears to understand command to sit at edge of bed but then only moves legs short distance back and forth Difficult to assess due to: Impaired communication, Non-English speaking  Blood pressure 116/66, pulse 84, temperature 98.2 F (36.8 C), temperature source Oral, resp. rate 18, height 5\' 2"  (1.575 m), weight 61.6 kg, SpO2 97 %.  Physical Exam  General: Alert and oriented x 3, No apparent distress HEENT: Head is normocephalic, atraumatic, PERRLA, EOMI, sclera anicteric, oral mucosa pink and moist, dentition intact, ext ear canals clear,  Neck: Supple without JVD or lymphadenopathy Heart: Reg rate and rhythm. No murmurs rubs or gallops Chest: CTA bilaterally without wheezes, rales, or rhonchi; no distress Abdomen: Soft, non-tender,  non-distended, bowel sounds positive. Extremities: No clubbing, cyanosis, or edema. Pulses are 2+ Skin: Cranial site clean and dry.  Neuro/MSK: Lethargic but arousable.  Non-English-speaking.  Follows some simple demonstrated commands.  Examination overall limited. 5/5 throughout right side and 4/5 throughout left side Psych: Pt's affect is appropriate. Pt is cooperative   Results for orders placed or performed during the hospital encounter of 06/27/19 (from the past 24 hour(s))  Glucose, capillary     Status: Abnormal   Collection Time: 07/02/19  7:29 AM  Result Value Ref Range   Glucose-Capillary 140 (H) 70 - 99 mg/dL  Glucose, capillary     Status: Abnormal   Collection Time: 07/02/19 12:13 PM  Result Value Ref Range   Glucose-Capillary 130 (H) 70 - 99 mg/dL  Glucose, capillary     Status: Abnormal   Collection Time: 07/02/19  5:05 PM  Result Value Ref Range   Glucose-Capillary 150 (H) 70 - 99 mg/dL  CBC     Status: Abnormal   Collection Time: 07/03/19  3:51 AM  Result Value Ref Range   WBC 14.0 (H) 4.0 - 10.5 K/uL   RBC 3.48 (L) 3.87 - 5.11 MIL/uL   Hemoglobin 10.7 (L) 12.0 - 15.0 g/dL   HCT 32.5 (L) 36.0 - 46.0 %   MCV 93.4 80.0 - 100.0 fL   MCH 30.7 26.0 - 34.0 pg   MCHC 32.9 30.0 - 36.0  g/dL   RDW 13.2 11.5 - 15.5 %   Platelets 221 150 - 400 K/uL   nRBC 0.0 0.0 - 0.2 %  Basic metabolic panel Once     Status: Abnormal   Collection Time: 07/03/19  3:51 AM  Result Value Ref Range   Sodium 139 135 - 145 mmol/L   Potassium 3.9 3.5 - 5.1 mmol/L   Chloride 104 98 - 111 mmol/L   CO2 27 22 - 32 mmol/L   Glucose, Bld 165 (H) 70 - 99 mg/dL   BUN 25 (H) 8 - 23 mg/dL   Creatinine, Ser 0.65 0.44 - 1.00 mg/dL   Calcium 8.9 8.9 - 10.3 mg/dL   GFR calc non Af Amer >60 >60 mL/min   GFR calc Af Amer >60 >60 mL/min   Anion gap 8 5 - 15   Overnight EEG with video  Result Date: 07/01/2019 Lora Havens, MD     07/02/2019  9:21 AM Patient Name:Lulani Brannigan PYK:[id number] Epilepsy  Attending:Priyanka [name] [name] Referring Physician/Provider: Dr. Emelda Brothers Duration:06/30/2019 0950 to 07/01/2019 1223  Patient history:69 year old female presented with seizure-like episode and was found to have left frontal lobe hemorrhagic mass. EEG evaluate for seizures.  Level of alertness: awake, asleep  AEDs during EEG study:Keppra  Technical aspects: This EEG study was done with scalp electrodes positioned according to the 10-20 International system of electrode placement. Electrical activity was acquired at a sampling rate of 500Hz  and reviewed with a high frequency filter of 70Hz  and a low frequency filter of 1Hz . EEG data were recorded continuously and digitally stored.  Description: No clear posterior dominant rhythm was seen. Sleep was characterized by vertex waves, sleep spindles (12-14hz ), maximal frontocentral region. EEG showed continuous generalized and lateralized left frontotemporal region  3-6 Hz theta-delta slowing.  Hyperventilation and photic stimulation were not performed.  Abnormality -Continuous slow, generalized and lateralized left frontotemporal region  IMPRESSION: This study suggestive of cortical dysfunction in left frontotemporal region consistent with underlying structural abnormality. Additionally, there is evidence of moderate diffuse encephalopathy, nonspecific etiology.No seizures ordefiniteepileptiform discharges were seen throughout the recording.  Priyanka [name] [name]     Assessment/Plan: Diagnosis: Left frontal hemorrhagic mass s/p crani, prelim + for metastatic carcinoma 1. Does the need for close, 24 hr/day medical supervision in concert with the patient's rehab needs make it unreasonable for this patient to be served in a less intensive setting? Yes 2. Co-Morbidities requiring supervision/potential complications: seizures, nausea, post-operative pain, impaired mobility and ADLs, impaired cognition, impaired speech 3. Due to bladder management, bowel  management, safety, skin/wound care, disease management, medication administration, pain management and patient education, does the patient require 24 hr/day rehab nursing? Yes 4. Does the patient require coordinated care of a physician, rehab nurse, therapy disciplines of PT, OT, SLP to address physical and functional deficits in the context of the above medical diagnosis(es)? Yes Addressing deficits in the following areas: balance, endurance, locomotion, strength, transferring, bowel/bladder control, bathing, dressing, feeding, grooming, toileting, cognition, speech, language and psychosocial support 5. Can the patient actively participate in an intensive therapy program of at least 3 hrs of therapy per day at least 5 days per week? Yes 6. The potential for patient to make measurable gains while on inpatient rehab is excellent 7. Anticipated functional outcomes upon discharge from inpatient rehab are min assist  with PT, min assist with OT, min assist with SLP. 8. Estimated rehab length of stay to reach the above functional goals is: 14-16 days 9. Anticipated discharge  destination: Home 10. Overall Rehab/Functional Prognosis: excellent  RECOMMENDATIONS: This patient's condition is appropriate for continued rehabilitative care in the following setting: CIR Patient has agreed to participate in recommended program. Yes Note that insurance prior authorization may be required for reimbursement for recommended care.  Comment: Mrs. Bordwell would be an excellent CIR candidate. She will have the support of several family members upon discharge home. We will continue to follow in Mrs. Brzoska's care. Thank you for this consult.   Lavon Paganini Angiulli, PA-C 07/03/2019   I have personally performed a face to face diagnostic evaluation, including, but not limited to relevant history and physical exam findings, of this patient and developed relevant assessment and plan.  Additionally, I have reviewed and concur with the  physician assistant's documentation above.  Leeroy Cha, MD

## 2019-07-03 NOTE — PMR Pre-admission (Addendum)
PMR Admission Coordinator Pre-Admission Assessment  Patient: Sara Schroeder is an 69 y.o., female MRN: [id number] DOB: [birth year] Height: 5\' 2"  (157.5 cm) Weight: 61.6 kg              Insurance Information HMO:     PPO:      PCP:      IPA:      80/20:      OTHER:  PRIMARY: Medicaid [place]      Policy#: [id number]      Subscriber: patient CM Name:       Phone#:      Fax#:  Pre-Cert#: MAA coverage code      Employer:  Benefits:  Phone #:      Name:  Eff. Date: active as of 07/05/19     Deduct:       Out of Pocket Max:       Life Max:   CIR:       SNF:  Outpatient:      Co-Pay:  Home Health:       Co-Pay:  DME:      Co-Pay:  Providers:  SECONDARY:       Policy#:       Phone#:   Development worker, community:       Phone#:   The Engineer, petroleum" for patients in Inpatient Rehabilitation Facilities with attached "Privacy Act [place] Records" was provided and verbally reviewed with: N/A  Emergency Contact Information Contact Information     Name Relation Home Work Mobile   Day, Day Son   [phone]   Spero Geralds   [phone]   Gerre Couch    [phone]   Bing Neighbors   [phone]      Current Medical History  Patient Admitting Diagnosis: Left frontal hemorrhagic mass s/p crani, 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  Presented 06/27/2019 with reported seizure and aphasia.   Cranial CT scan showed left frontal lobe hemorrhagic mass with surrounding vasogenic edema and a 2 mm rightward midline shift.  MRI solitary 2.6 cm left lateral frontal mass compatible with hemorrhagic neoplasm.  CT of the chest abdomen pelvis showed spiculated right middle lobe pulmonary nodule measuring 11 x 11 x 10 mm.  No other evidence of metastatic disease.  Patient underwent left frontal craniotomy tumor resection  06/29/2019 per Dr. Venetia Constable.  She was extubated 06/30/2019.  EEG showed no seizure activity maintained on Keppra for seizure prophylaxis.  Oncology services Dr. 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  Radiation oncology has also been consulted for planned radiation therapy to the brain.  Plan PET scan as outpatient.  Decadron protocol as indicated and course has been completed.  Tolerating a dysphagia #1 thin liquid diet.  Subcutaneous heparin was initiated for DVT prophylaxis 07/03/2019.    Past Medical History  Past Medical History:  Diagnosis Date   Anxiety    Hypertension     Family History  family history is not on file.  Prior Rehab/Hospitalizations:  Has the patient had prior rehab or hospitalizations prior to admission? No  Has the patient had major surgery during 100 days prior to admission? Yes  Current Medications   Current Facility-Administered Medications:    acetaminophen (TYLENOL) tablet 650 mg, 650 mg, Oral, Q4H PRN, Judith Part, MD,  650 mg at 07/06/19 1812   docusate (COLACE) 50 MG/5ML liquid 100 mg, 100 mg, Oral, BID, Erick Colace, NP, 100 mg at 07/07/19 0855   enoxaparin (LOVENOX) injection 40 mg, 40 mg, Subcutaneous, Q24H, Hall, Carole N, DO, 40 mg at 07/06/19 2100   feeding supplement (ENSURE ENLIVE) (ENSURE ENLIVE) liquid 237 mL, 237 mL, Oral, TID BM, Hall, Carole N, DO, 237 mL at 07/07/19 0902   feeding supplement (PRO-STAT SUGAR FREE 64) liquid 30 mL, 30 mL, Oral, BID, Hall, Carole N, DO, 30 mL at 07/07/19 0855   fluconazole (DIFLUCAN) tablet 100 mg, 100 mg, Oral, Daily, Hall, Carole N, DO, 100 mg at 07/07/19 0901   levETIRAcetam (KEPPRA) 100 MG/ML solution 1,000 mg, 1,000 mg, Oral, BID, Hall, Carole N, DO, 1,000 mg at 07/07/19 0855   LORazepam (ATIVAN) injection 2 mg, 2 mg, Intravenous, Q5 min PRN, Erick Colace, NP, 2 mg at 07/02/19 2356   magic mouthwash  w/lidocaine, 5 mL, Oral, TID PRN, [place] Crane, Carole N, DO   menthol-cetylpyridinium (CEPACOL) lozenge 3 mg, 1 lozenge, Oral, PRN, Judith Part, MD, 3 mg at 07/04/19 1843   multivitamin with minerals tablet 1 tablet, 1 tablet, Oral, Daily, Hall, Carole N, DO, 1 tablet at 07/07/19 0855   nystatin (MYCOSTATIN) 100000 UNIT/ML suspension 500,000 Units, 5 mL, Oral, QID, Hall, Carole N, DO, 500,000 Units at 07/07/19 0855   ondansetron (ZOFRAN) injection 4 mg, 4 mg, Intravenous, Q6H PRN, Erick Colace, NP   polyethylene glycol (MIRALAX / GLYCOLAX) packet 17 g, 17 g, Per Tube, Daily PRN, Erick Colace, NP, 17 g at 07/04/19 0855  Patients Current Diet:  Diet Order             DIET - DYS 1 Room service appropriate? Yes with Assist; Fluid consistency: Thin  Diet effective now              Meds crushed in pureed  Precautions / Restrictions Precautions Precautions: Fall Precaution Comments: impaired vision Restrictions Weight Bearing Restrictions: No   Has the patient had 2 or more falls or a fall with injury in the past year?No  Prior Activity Level Community (5-7x/wk): ~3x/week if feeling well per son  Prior Functional Level Prior Function Level of Independence: Independent Comments: pt walks around neighborhood with spouse often. Per son pt takes bird baths  Self Care: Did the patient need help bathing, dressing, using the toilet or eating?  Independent  Indoor Mobility: Did the patient need assistance with walking from room to room (with or without device)? Independent  Stairs: Did the patient need assistance with internal or external stairs (with or without device)? Independent  Functional Cognition: Did the patient need help planning regular tasks such as shopping or remembering to take medications? Independent  Home Assistive Devices / Equipment Home Assistive Devices/Equipment: None Home Equipment: None  Prior Device Use: Indicate devices/aids used by the patient  prior to current illness, exacerbation or injury? None of the above  Current Functional Level Cognition  Arousal/Alertness: Awake/alert Overall Cognitive Status: Impaired/Different from baseline Difficult to assess due to: Non-English speaking, Impaired communication Current Attention Level: Sustained Orientation Level: Oriented to person, Other (comment) Following Commands: Follows one step commands with increased time, Follows one step commands inconsistently Safety/Judgement: Decreased awareness of safety, Decreased awareness of deficits General Comments: pt with slow processing and at times not responding to commands or questions; pt able to complete mobility tasks when initiated by therapists Attention: Focused Behaviors: Restless, Impulsive  Extremity Assessment (includes Sensation/Coordination)  Upper Extremity Assessment: RUE deficits/detail RUE Deficits / Details: Pt demonstrates strength grossly 3-/5.    RUE Coordination: decreased gross motor, decreased fine motor  Lower Extremity Assessment: Defer to PT evaluation    ADLs  Overall ADL's : Needs assistance/impaired Eating/Feeding: Set up, Minimal assistance, Sitting Eating/Feeding Details (indicate cue type and reason): assist to open containers. Able to load spoon and bring food to mouth with no physical assist. suspect needing assist for full meal consumption 2/2 fatigue. Grooming: Oral care, Maximal assistance, Standing Grooming Details (indicate cue type and reason): Pt able to identiy toothbrush, but unable to sequence toothpaste or wetting of toothbrush without cues to complete in appropriate order Upper Body Bathing: Maximal assistance, Sitting Lower Body Bathing: Maximal assistance, Sit to/from stand Upper Body Dressing : Maximal assistance, Sitting Lower Body Dressing: Sitting/lateral leans, Minimal assistance, Min guard Lower Body Dressing Details (indicate cue type and reason): Donning socks, increased cues to  adjust sock on R foot, increased prompting to initiate task, socks had to be placed in pt's hands to initiate Toilet Transfer: Moderate assistance, +2 for safety/equipment, Stand-pivot, Minimal assistance Toilet Transfer Details (indicate cue type and reason): HHA with PT simulated to recliner Toileting- Clothing Manipulation and Hygiene: Maximal assistance, Sit to/from stand Functional mobility during ADLs: +2 for physical assistance, +2 for safety/equipment, Moderate assistance General ADL Comments: HHA of 2 to walk, would not ambulate without 2 person assist, (would grip onto objects) decreased reports of dizziness, however continued deficits in vision which is difficult to determine due to language deficit    Mobility  Overal bed mobility: Needs Assistance Bed Mobility: Supine to Sit Supine to sit: Min guard, HOB elevated General bed mobility comments: pt able to long sit in bed; cues for sequencing to get to EOB    Transfers  Overall transfer level: Needs assistance Equipment used: Rolling walker (2 wheeled) Transfers: Sit to/from Stand Sit to Stand: Min assist Stand pivot transfers: Mod assist General transfer comment: pt stood X 2 from EOB with min A to power up and for balance upon standing (2 person hand held assist use to progress mobility)    Ambulation / Gait / Stairs / Wheelchair Mobility  Ambulation/Gait Ambulation/Gait assistance: Min assist, +2 physical assistance, +2 safety/equipment Gait Distance (Feet): (~60 ft) Assistive device: 2 person hand held assist Gait Pattern/deviations: Step-through pattern, Decreased stride length, Shuffle, Trunk flexed, Narrow base of support General Gait Details: assist for balance; cues for increased bilat step lengths; no change in gait noted with cues Gait velocity: very slow not attempted due to dizziness   Posture / Balance Dynamic Sitting Balance Sitting balance - Comments: able to maintain EOB sitting with min gaurd assist   Balance Overall balance assessment: Needs assistance Sitting-balance support: No upper extremity supported, Feet unsupported Sitting balance-Leahy Scale: Fair Sitting balance - Comments: able to maintain EOB sitting with min gaurd assist  Standing balance support: Single extremity supported Standing balance-Leahy Scale: Poor Standing balance comment: able to static stand without UE support; without 2 person HHA pt would grip onto objects in environment and require increased encouragement to ambulate    Special needs/care consideration Skin surgical incision  Bowel and bladder incontinence  Designated visitors: Family have been staying with her 24/7 in hospital due to language barrier. Speaks "Santiago Glad". Son to clarify specific members Julieta Bellini to be arranged if possible for therapy. [place] does have dialect   Previous Home Environment  Living Arrangements: Spouse/significant other  Lives With:  Spouse Available Help at Discharge: Family, Available 24 hours/day Type of Home: House Home Layout: Two level, Able to live on main level with bedroom/bathroom Alternate Level Stairs-Number of Steps: flight Home Access: Level entry Bathroom Shower/Tub: Chiropodist: Standard Bathroom Accessibility: Yes How Accessible: Accessible via walker [place]: No  Discharge Living Setting Plans for Discharge Living Setting: Patient's home Type of Home at Discharge: House Discharge Home Layout: Two level, Able to live on main level with bedroom/bathroom Discharge Home Access: Level entry Discharge Bathroom Shower/Tub: Tub/shower unit Discharge Bathroom Toilet: Standard Discharge Bathroom Accessibility: Yes How Accessible: Accessible via walker Does the patient have any problems obtaining your medications?: No  Social/Family/Support Systems Patient Roles: Spouse, Parent Anticipated Caregiver: Pricilla Holm, son Anticipated Caregiver's Contact Information:  [phone] Ability/Limitations of Caregiver: n/a Caregiver Availability: 24/7 Discharge Plan Discussed with Primary Caregiver: Yes Is Caregiver In Agreement with Plan?: Yes Does Caregiver/Family have Issues with Lodging/Transportation while Pt is in Rehab?: No  Goals Patient/Family Goal for Rehab: PT/OT/SLP supervision to mod I Expected length of stay: 7 -10 days Cultural Considerations: Santiago Glad dialect Additional Information: needs interpreter Pt/Family Agrees to Admission and willing to participate: Yes Program Orientation Provided & Reviewed with Pt/Caregiver Including Roles  & Responsibilities: Yes  Barriers to Discharge: Insurance for SNF coverage  Decrease burden of Care through IP rehab admission: N/a  Possible need for SNF placement upon discharge: Not anticipated  Patient Condition: This patient's medical and functional status has changed since the consult dated: 07/03/2019 in which the Rehabilitation Physician determined and documented that the patient's condition is appropriate for intensive rehabilitative care in an inpatient rehabilitation facility. See "History of Present Illness" (above) for medical update. Functional changes are: overall min assist. Patient's medical and functional status update has been discussed with the Rehabilitation physician and patient remains appropriate for inpatient rehabilitation. Will admit to inpatient rehab today.  Preadmission Screen Completed By:  Bethel Born, CCC-SLP, with brief updates by Danne Baxter RN MSN 07/07/2019 10:28 AM ______________________________________________________________________   Discussed status with Dr. Posey Pronto on 07/07/2019 at 1028 and received approval for admission today.  Admission Coordinator: Gayland Curry SLP with updates by  Cleatrice Burke, time 1028 Date 07/07/2019

## 2019-07-03 NOTE — Evaluation (Signed)
Occupational Therapy Evaluation Patient Details Name: Sara Schroeder MRN: [id number] DOB: April 30, 1950 Today's Date: 07/03/2019    History of Present Illness 69 year old female with a past medical history significant for hypertension and depression.  The patient presented to the emergency room with new onset of seizure.  CT of the head on admission showed a left frontal lobe hemorrhagic mass with surrounding vasogenic edema and a 2 mm of rightward midline shift. CT chest also demonstrating right middle pulmonary nodule. Pt underwent L frontal craniotomy and tumor resection on 5/6, preliminary pathology showing metastatic adenocarcinoma.   Clinical Impression   Pt admitted with above. She demonstrates the below listed deficits and will benefit from continued OT to maximize safety and independence with BADLs.  Pt presents to OT with generalized weakness, Rt hemiparesis, Impaired vision, ideamotor apraxia, deficits with attention, initiation, sequencing, and problem solving, and decreased balance.  She requires mod A - max A for ADLs, and mod A for functional transfers.  She lives with her spouse and was mod I PTA.  I anticipate as her lethargy improves, she will progress nicely.  Recommend CIR.       Follow Up Recommendations  CIR;Supervision/Assistance - 24 hour    Equipment Recommendations  None recommended by OT    Recommendations for Other Services Rehab consult     Precautions / Restrictions        Mobility Bed Mobility Overal bed mobility: Needs Assistance Bed Mobility: Supine to Sit     Supine to sit: Mod assist     General bed mobility comments: assist to lift trunk   Transfers Overall transfer level: Needs assistance Equipment used: 1 person hand held assist Transfers: Sit to/from Stand;Stand Pivot Transfers Sit to Stand: Mod assist Stand pivot transfers: Mod assist       General transfer comment: assist to move into standing, assist to weighshift to step and assist for  balance     Balance Overall balance assessment: Needs assistance Sitting-balance support: No upper extremity supported Sitting balance-Leahy Scale: Fair Sitting balance - Comments: able to maintain EOB sitting with min gaurd assist    Standing balance support: Bilateral upper extremity supported Standing balance-Leahy Scale: Poor Standing balance comment: requires mod A and bil. UE support                            ADL either performed or assessed with clinical judgement   ADL Overall ADL's : Needs assistance/impaired Eating/Feeding: Sitting;Maximal assistance   Grooming: Wash/dry hands;Oral care;Maximal assistance;Sitting Grooming Details (indicate cue type and reason): Pt requires hand over hand assist due to difficulty orienting hand to toothbrush.  She also requires cues and assist for attention  Upper Body Bathing: Maximal assistance;Sitting   Lower Body Bathing: Maximal assistance;Sit to/from stand   Upper Body Dressing : Maximal assistance;Sitting   Lower Body Dressing: Total assistance;Sit to/from stand   Toilet Transfer: Moderate assistance;Stand-pivot;BSC   Toileting- Clothing Manipulation and Hygiene: Maximal assistance;Sit to/from stand       Functional mobility during ADLs: Moderate assistance       Vision Baseline Vision/History: Wears glasses Wears Glasses: At all times Patient Visual Report: Blurring of vision Additional Comments: Pt with ptosis Rt eye.  She indicates blurring that is worse than normal without glasses, and possible diplopia.  Unable to accurately assess this date due to attentional deficits, lethargy, and communication difficulties      Perception Perception Perception Tested?: Yes   Praxis Praxis Praxis  tested?: Deficits Deficits: Ideomotor;Initiation Praxis-Other Comments: Pt demonstrates a delay in initiation, and demonstrates difficulty orienting her hand to the toothbrush appropriately     Pertinent Vitals/Pain  Pain Assessment: Faces Faces Pain Scale: Hurts a little bit Pain Location: headache  Pain Descriptors / Indicators: Grimacing Pain Intervention(s): Monitored during session     Hand Dominance Right   Extremity/Trunk Assessment Upper Extremity Assessment Upper Extremity Assessment: RUE deficits/detail RUE Deficits / Details: Pt demonstrates strength grossly 3-/5.    RUE Coordination: decreased gross motor;decreased fine motor   Lower Extremity Assessment Lower Extremity Assessment: Defer to PT evaluation   Cervical / Trunk Assessment Cervical / Trunk Assessment: Normal   Communication Communication Communication: Prefers language other than English;Expressive difficulties;Receptive difficulties   Cognition Arousal/Alertness: Awake/alert;Lethargic Behavior During Therapy: Flat affect Overall Cognitive Status: Impaired/Different from baseline Area of Impairment: Orientation;Attention;Following commands;Problem solving                 Orientation Level: Disoriented to;Time;Situation Current Attention Level: Sustained   Following Commands: Follows one step commands consistently;Follows one step commands with increased time     Problem Solving: Slow processing;Decreased initiation;Difficulty sequencing;Requires verbal cues;Requires tactile cues General Comments: Pt knows she is in the hospital because she was sick.  She is unaware that she had surgery.  She thinks it's October.  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  Spouse present and very supportive.  Spoke with son via phone at the initiation of eval     Exercises     Shoulder Instructions      Home Living Family/patient expects to be discharged to:: Private residence Living Arrangements: Spouse/significant other Available Help at Discharge: Family;Available 24 hours/day Type of Home: House Home Access: Level entry     Home  Layout: Two level;Able to live on main level with bedroom/bathroom Alternate Level Stairs-Number of Steps: flight   Bathroom Shower/Tub: Teacher, early years/pre: Standard Bathroom Accessibility: Yes How Accessible: Accessible via walker Home Equipment: None      Lives With: Spouse    Prior Functioning/Environment Level of Independence: Independent        Comments: pt walks around neighborhood with spouse often. Per son pt takes bird baths        OT Problem List: Decreased strength;Decreased activity tolerance;Impaired balance (sitting and/or standing);Decreased coordination;Decreased cognition;Decreased safety awareness;Decreased knowledge of use of DME or AE;Impaired UE functional use;Decreased range of motion;Impaired vision/perception      OT Treatment/Interventions: Self-care/ADL training;Neuromuscular education;DME and/or AE instruction;Therapeutic activities;Cognitive remediation/compensation;Visual/perceptual remediation/compensation;Patient/family education;Balance training    OT Goals(Current goals can be found in the care plan section) Acute Rehab OT Goals Patient Stated Goal: Did not state  OT Goal Formulation: With patient/family Time For Goal Achievement: 07/17/19 Potential to Achieve Goals: Good ADL Goals Pt Will Perform Eating: (P) with min assist;sitting Pt Will Perform Grooming: (P) with min assist;sitting Pt Will Perform Upper Body Bathing: (P) with min assist;sitting Pt Will Perform Lower Body Bathing: (P) with mod assist;sit to/from stand Pt Will Transfer to Toilet: (P) with min assist;ambulating;regular height toilet;bedside commode;grab bars Pt Will Perform Toileting - Clothing Manipulation and hygiene: (P) with min assist;sit to/from stand  OT Frequency: Min 2X/week   Barriers to D/C:            Co-evaluation              AM-PAC OT "6 Clicks" Daily Activity  Outcome Measure Help from another person eating meals?: A Lot Help  from another person taking care of personal grooming?: A Lot Help from another person toileting, which includes using toliet, bedpan, or urinal?: A Lot Help from another person bathing (including washing, rinsing, drying)?: A Lot Help from another person to put on and taking off regular upper body clothing?: A Lot Help from another person to put on and taking off regular lower body clothing?: Total 6 Click Score: 11   End of Session Equipment Utilized During Treatment: Gait belt Nurse Communication: Mobility status  Activity Tolerance: Patient tolerated treatment well Patient left: in chair;with call bell/phone within reach;with chair alarm set;with family/visitor present  OT Visit Diagnosis: Unsteadiness on feet (R26.81);Cognitive communication deficit (R41.841)                Time: 3017-2091 OT Time Calculation (min): 57 min Charges:  OT General Charges $OT Visit: 1 Visit OT Evaluation $OT Eval Moderate Complexity: 1 Mod OT Treatments $Self Care/Home Management : 38-52 mins  Nilsa Nutting., OTR/L Acute Rehabilitation Services Pager [phone] Office [place], [place] 07/03/2019, 3:03 PM

## 2019-07-03 NOTE — Progress Notes (Signed)
Neurosurgery Service Progress Note  Subjective: NAE ON, improving over the weekend  Objective: Vitals:   07/02/19 2349 07/03/19 0407 07/03/19 0409 07/03/19 0738  BP: 123/68 116/66  112/60  Pulse: 75 84  83  Resp:    18  Temp: 98.7 F (37.1 C) 98.2 F (36.8 C)  98.4 F (36.9 C)  TempSrc: Oral Oral  Oral  SpO2: 94% 97%  96%  Weight:   61.6 kg   Height:       Temp (24hrs), Avg:98.5 F (36.9 C), Min:98.2 F (36.8 C), Max:98.7 F (37.1 C)  CBC Latest Ref Rng & Units 07/03/2019 07/02/2019 07/01/2019  WBC 4.0 - 10.5 K/uL 14.0(H) 14.5(H) 16.1(H)  Hemoglobin 12.0 - 15.0 g/dL 10.7(L) 10.8(L) 10.1(L)  Hematocrit 36.0 - 46.0 % 32.5(L) 33.0(L) 31.2(L)  Platelets 150 - 400 K/uL 221 189 156   BMP Latest Ref Rng & Units 07/03/2019 07/02/2019 07/01/2019  Glucose 70 - 99 mg/dL 165(H) 147(H) 124(H)  BUN 8 - 23 mg/dL 25(H) 21 17  Creatinine 0.44 - 1.00 mg/dL 0.65 0.70 0.64  Sodium 135 - 145 mmol/L 139 137 138  Potassium 3.5 - 5.1 mmol/L 3.9 3.8 3.4(L)  Chloride 98 - 111 mmol/L 104 101 103  CO2 22 - 32 mmol/L 27 25 23   Calcium 8.9 - 10.3 mg/dL 8.9 9.2 8.8(L)    Intake/Output Summary (Last 24 hours) at 07/03/2019 0913 Last data filed at 07/02/2019 1300 Gross per 24 hour  Intake 180.05 ml  Output --  Net 180.05 ml    Current Facility-Administered Medications:  .  acetaminophen (TYLENOL) tablet 650 mg, 650 mg, Oral, Q4H PRN, Judith Part, MD, 650 mg at 07/02/19 2351 .  dexamethasone (DECADRON) injection 4 mg, 4 mg, Intravenous, Q8H, Erick Colace, NP, 4 mg at 07/03/19 0612 .  docusate (COLACE) 50 MG/5ML liquid 100 mg, 100 mg, Oral, BID, Erick Colace, NP, 100 mg at 07/03/19 0805 .  levETIRAcetam (KEPPRA) IVPB 1000 mg/100 mL premix, 1,000 mg, Intravenous, Q12H, Erick Colace, NP, Last Rate: 400 mL/hr at 07/03/19 0805, 1,000 mg at 07/03/19 0805 .  LORazepam (ATIVAN) injection 2 mg, 2 mg, Intravenous, Q5 min PRN, Erick Colace, NP, 2 mg at 07/02/19 2356 .  ondansetron (ZOFRAN)  injection 4 mg, 4 mg, Intravenous, Q6H PRN, Erick Colace, NP .  polyethylene glycol (MIRALAX / GLYCOLAX) packet 17 g, 17 g, Per Tube, Daily PRN, Erick Colace, NP, 17 g at 07/02/19 1725   Physical Exam: Awake/alert, Ox1 but occasionally speaking a few words at a time, comprehension intact, R side 4/5, L 5/5 Incision c/d/i  Assessment & Plan: 69 y.o. woman p/w seizures, MRI brain with left frontal 23x8mm hemorrhagic mass, CT CAP with pulmonary nodule with some growth since 2020 CT. 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

## 2019-07-03 NOTE — Progress Notes (Signed)
PT Cancellation Note  Patient Details Name: Sara Schroeder MRN: [id number] DOB: 07/31/1950   Cancelled Treatment:    Reason Eval/Treat Not Completed: Patient at procedure or test/unavailable  Nursing in to bathe/clean pt. RN noted pt's HR currently 140s while lying in bed.    Arby Barrette, PT Pager [phone]   Rexanne Mano 07/03/2019, 3:39 PM

## 2019-07-03 NOTE — Progress Notes (Signed)
PROGRESS NOTE  Sara Schroeder QMG:[id number] DOB: 1951/02/13 DOA: 06/27/2019 PCP: [facility] [place]  HPI/Recap of past 24 hours: Sara Schroeder is a 69 yo F with h/o HTN, anxiety, and MDD who presented with seizure and was found to have a large hemorrhagic L frontal lobe mass. Briefly, she had new onset seizure at [facility] on 5/3 characterized by R-sided convulsions and twitching. EMS arrived and she had another seizure in transit. She was given Ativan and Keppra at [facility] ED. Vitals on presentation significant for elevated BP of 170s/90s, HR 100s, T37. Labs were unremarkable except WBC of 17.2. CT head showed 2.7x2.0x2.8cm hemorrhagic mass in the L frontal lobe. Pt was intubated for airway protection and transferred to [facility] ICU. Pt's son and granddaughter deny history of seizures.   07/03/19:  Seen and examined with her son and husband at bedside.  No headaches, dizziness or nausea.  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 Suspect some of this reflects a postictal state superimposed on recent brain met Plan Continue serial neuro checks No change in anticonvulsants CIR consult  Abdominal pain Suspect constipation Plan Regular diet Fleet Enema  Hypertension BP at goal Plan As needed hydralazine  Fluid and electrolyte Imbalance  Plan Replace and recheck PRN  Exophytic thyroid mass  Low  TSH Plan PET scan as out-pt   Best practice:  Diet: Tube feeds  Pain/Anxiety/Delirium protocol (if indicated): Fentanyl prn  ; if she swallows will move to hydrocodone  VAP protocol (if indicated): per protocol  DVT prophylaxis: SCDs GI prophylaxis: Protonix  Glucose control: none indicated  Mobility: Intubated, sedated, bedrest Code Status: FULL Family Communication: 5/6 AM: Son at bedside.  Disposition:neuro med/surg     Status is: Inpatient   Dispo: The patient is from: Home              Anticipated d/c is to: CIR              Anticipated d/c date is: next 24 hours               Patient currently awaiting placement         Objective: Vitals:   07/02/19 2349 07/03/19 0407 07/03/19 0409 07/03/19 0738  BP: 123/68 116/66  112/60  Pulse: 75 84  83  Resp:    18  Temp: 98.7 F (37.1 C) 98.2 F (36.8 C)  98.4 F (36.9 C)  TempSrc: Oral Oral  Oral  SpO2: 94% 97%  96%  Weight:   61.6 kg   Height:        Intake/Output Summary (Last 24 hours) at 07/03/2019 0746 Last data filed at 07/02/2019 1300 Gross per 24 hour  Intake 365.75 ml  Output --  Net 365.75 ml   Filed Weights   07/01/19 0500 07/02/19 0500 07/03/19 0409  Weight: 57 kg 57.5 kg 61.6 kg    Exam:  . General: 69 y.o. year-old female well developed well nourished in no acute  distress.  Alert and cooperative. . Cardiovascular: Regular rate and rhythm with no rubs or gallops.    Marland Kitchen Respiratory: Clear to auscultation with no wheezes or rales.  . Abdomen: Soft nontender nondistended with normal bowel sounds x4 quadrants. . Musculoskeletal: No lower extremity edema. Marland Kitchen Psychiatry: Mood is appropriate for condition and setting   Data Reviewed: CBC: Recent Labs  Lab 06/26/19 2002 06/27/19 0401 06/29/19 0635 06/30/19 0625 07/01/19 0554 07/02/19 0619 07/03/19 0351  WBC 17.9*   < > 21.2* 19.3* 16.1* 14.5* 14.0*  NEUTROABS 15.2*  --   --   --   --   --   --   HGB 13.1   < > 11.7* 10.7* 10.1* 10.8*  10.7*  HCT 38.7   < > 36.1 32.9* 31.2* 33.0* 32.5*  MCV 91.7   < > 94.3 93.7 93.7 93.0 93.4  PLT 230   < > 196 165 156 189 221   < > = values in this interval not displayed.   Basic Metabolic Panel: Recent Labs  Lab 06/29/19 0635 06/29/19 1657 06/29/19 1627 06/30/19 0625 06/30/19 9038 07/01/19 0554 07/02/19 0619 07/03/19 0351  NA 141   < >  --  147* 140 138 137 139  K 4.0   < >  --  4.4 3.6 3.4* 3.8 3.9  CL 104   < >  --  116* 104 103 101 104  CO2 25   < >  --  19* 23 23 25 27   GLUCOSE 159*   < >  --  104* 144* 124* 147* 165*  BUN 26*   < >  --  19 22 17 21  25*  CREATININE 0.71   < >  --  0.91 0.71 0.64 0.70 0.65  CALCIUM 9.5   < >  --  8.6* 8.7* 8.8* 9.2 8.9  MG 2.2   < > 2.1 2.5* 2.1 2.0 2.0  --   PHOS 2.7  --   --  3.2 3.8 3.0 3.3  --    < > = values in this interval not displayed.   GFR: Estimated Creatinine Clearance: 58.1 mL/min (by C-G formula based on SCr of 0.65 mg/dL). Liver Function Tests: Recent Labs  Lab 06/26/19 2002 06/27/19 0928  AST 25 26  ALT 17 17  ALKPHOS 82 60  BILITOT 1.2 1.2  PROT 7.9 6.1*  ALBUMIN 4.8 3.4*   No results for input(s): LIPASE, AMYLASE in the last 168 hours. No results for input(s): AMMONIA in the last 168 hours. Coagulation Profile: Recent Labs  Lab 06/26/19 2002  INR 0.9   Cardiac Enzymes: No results for input(s): CKTOTAL, CKMB, CKMBINDEX, TROPONINI in the last 168 hours. BNP (last 3 results) No results for input(s): PROBNP in the last 8760 hours. HbA1C: No results for input(s): HGBA1C in the last 72 hours. CBG: Recent Labs  Lab 07/01/19 2347 07/02/19 0328 07/02/19 0729 07/02/19 1213 07/02/19 1705  GLUCAP 118* 141* 140* 130* 150*   Lipid Profile: No results for input(s): CHOL, HDL, LDLCALC, TRIG, CHOLHDL, LDLDIRECT in the last 72 hours. Thyroid Function Tests: No results for input(s): TSH, T4TOTAL, FREET4, T3FREE, THYROIDAB in the last 72 hours. Anemia Panel: No results for input(s): VITAMINB12, FOLATE,  FERRITIN, TIBC, IRON, RETICCTPCT in the last 72 hours. Urine analysis:    Component Value Date/Time   COLORURINE STRAW (A) 06/26/2019 2200   APPEARANCEUR CLEAR (A) 06/26/2019 2200   LABSPEC 1.016 06/26/2019 2200   PHURINE 8.0 06/26/2019 2200   GLUCOSEU  NEGATIVE 06/26/2019 [place] 06/26/2019 [place] 06/26/2019 [place] 06/26/2019 2200   PROTEINUR 100 (A) 06/26/2019 2200   NITRITE NEGATIVE 06/26/2019 2200   LEUKOCYTESUR NEGATIVE 06/26/2019 2200   Sepsis Labs: @LABRCNTIP (procalcitonin:4,lacticidven:4)  ) Recent Results (from the past 240 hour(s))  Blood Culture (routine x 2)     Status: None   Collection Time: 06/26/19  8:02 PM   Specimen: BLOOD  Result Value Ref Range Status   Specimen Description BLOOD LEFT HAND  Final   Special Requests BOTTLES DRAWN AEROBIC AND ANAEROBIC BCAV  Final   Culture   Final    NO GROWTH 5 DAYS Performed at [facility], [address] [place], [place] [address]    Report Status 07/01/2019 FINAL  Final  Blood Culture (routine x 2)     Status: None   Collection Time: 06/26/19  8:02 PM   Specimen: BLOOD  Result Value Ref Range Status   Specimen Description BLOOD LAC  Final   Special Requests BOTTLES DRAWN AEROBIC AND ANAEROBIC [place]  Final   Culture   Final    NO GROWTH 5 DAYS Performed at [facility], [address] [place], [place] [address]    Report Status 07/01/2019 FINAL  Final  Respiratory Panel by RT PCR (Flu A&B, Covid) - Nasopharyngeal Swab     Status: None   Collection Time: 06/26/19  8:42 PM   Specimen: Nasopharyngeal Swab  Result Value Ref Range Status   SARS Coronavirus 2 by RT PCR NEGATIVE NEGATIVE Final    Comment: (NOTE) SARS-CoV-2 target nucleic acids are NOT DETECTED. The SARS-CoV-2 RNA is generally detectable in upper respiratoy specimens during the acute phase of infection. The lowest concentration of SARS-CoV-2 viral copies this assay can detect  is 131 copies/mL. A negative result does not preclude SARS-Cov-2 infection and should not be used as the sole basis for treatment or other patient management decisions. A negative result may occur with  improper specimen collection/handling, submission of specimen other than nasopharyngeal swab, presence of viral mutation(s) within the areas targeted by this assay, and inadequate number of viral copies (<131 copies/mL). A negative result must be combined with clinical observations, patient history, and epidemiological information. The expected result is Negative. Fact Sheet for Patients:  PinkCheek.be Fact Sheet for Healthcare Providers:  GravelBags.it This test is not yet ap proved or cleared by the [place] FDA and  has been authorized for detection and/or diagnosis of SARS-CoV-2 by FDA under an Emergency Use Authorization (EUA). This EUA will remain  in effect (meaning this test can be used) for the duration of the COVID-19 declaration under Section 564(b)(1) of the Act, 21 U.S.C. section 360bbb-3(b)(1), unless the authorization is terminated or revoked sooner.    Influenza A by PCR NEGATIVE NEGATIVE Final   Influenza B by PCR NEGATIVE NEGATIVE Final    Comment: (NOTE) The Xpert Xpress SARS-CoV-2/FLU/RSV assay is intended as an aid in  the diagnosis of influenza from Nasopharyngeal swab specimens and  should not be used as a sole basis for treatment. Nasal washings and  aspirates are unacceptable for Xpert Xpress SARS-CoV-2/FLU/RSV  testing. Fact Sheet for Patients: PinkCheek.be Fact Sheet for Healthcare Providers: GravelBags.it This test is not yet approved or cleared by the [place] FDA and  has been authorized for detection and/or diagnosis of SARS-CoV-2 by  FDA under an Emergency Use Authorization (EUA). This EUA will remain  in effect (meaning this  test can be used) for the duration of the  Covid-19 declaration under Section 564(b)(1) of the Act, 21  U.S.C. section 360bbb-3(b)(1), unless the authorization is  terminated or revoked. Performed at [facility], [address] [place], [place] [address]   MRSA PCR Screening     Status: None   Collection Time: 06/27/19  3:52 AM   Specimen: Nasal Mucosa; Nasopharyngeal  Result Value Ref Range Status   MRSA by PCR NEGATIVE NEGATIVE Final    Comment:        The GeneXpert MRSA Assay (FDA approved for NASAL specimens only), is one component of a comprehensive MRSA colonization surveillance program. It is not intended to diagnose MRSA infection nor to guide or monitor treatment for MRSA infections. Performed at [facility], [place] [address] [place],  [address]       Studies: No results found.  Scheduled Meds: . dexamethasone (DECADRON) injection  4 mg Intravenous Q8H  . docusate  100 mg Oral BID    Continuous Infusions: . levETIRAcetam 1,000 mg (07/02/19 2026)     LOS: 6 days     Kayleen Memos, MD [facility] Pager [phone]  If 7PM-7AM, please contact night-coverage [email or web address] Password [facility] 07/03/2019, 7:46 AM

## 2019-07-04 DIAGNOSIS — C7931 Secondary malignant neoplasm of brain: Secondary | ICD-10-CM | POA: Diagnosis not present

## 2019-07-04 DIAGNOSIS — J9601 Acute respiratory failure with hypoxia: Secondary | ICD-10-CM | POA: Diagnosis not present

## 2019-07-04 LAB — CBC
HCT: 32.5 % — ABNORMAL LOW (ref 36.0–46.0)
Hemoglobin: 10.7 g/dL — ABNORMAL LOW (ref 12.0–15.0)
MCH: 30.7 pg (ref 26.0–34.0)
MCHC: 32.9 g/dL (ref 30.0–36.0)
MCV: 93.4 fL (ref 80.0–100.0)
Platelets: 249 10*3/uL (ref 150–400)
RBC: 3.48 MIL/uL — ABNORMAL LOW (ref 3.87–5.11)
RDW: 13.1 % (ref 11.5–15.5)
WBC: 13.1 10*3/uL — ABNORMAL HIGH (ref 4.0–10.5)
nRBC: 0 % (ref 0.0–0.2)

## 2019-07-04 LAB — SURGICAL PATHOLOGY

## 2019-07-04 MED ORDER — LEVETIRACETAM 500 MG PO TABS: 1000.0000 mg | ORAL_TABLET | Freq: Two times a day (BID) | ORAL | Status: DC

## 2019-07-04 MED ORDER — DEXAMETHASONE 2 MG PO TABS
2.0000 mg | ORAL_TABLET | Freq: Two times a day (BID) | ORAL | Status: DC
  Administered 2019-07-04 (×2): 2 mg via ORAL
  Filled 2019-07-04 (×3): qty 1

## 2019-07-04 MED ORDER — DEXAMETHASONE SODIUM PHOSPHATE 4 MG/ML IJ SOLN
2.0000 mg | Freq: Two times a day (BID) | INTRAMUSCULAR | Status: DC
  Administered 2019-07-04: 09:00:00 2 mg via INTRAVENOUS
  Filled 2019-07-04: qty 1

## 2019-07-04 MED ORDER — LEVETIRACETAM 100 MG/ML PO SOLN
1000.0000 mg | Freq: Two times a day (BID) | ORAL | Status: DC
  Administered 2019-07-04 – 2019-07-07 (×7): 1000 mg via ORAL
  Filled 2019-07-04 (×7): qty 10

## 2019-07-04 NOTE — Progress Notes (Addendum)
PROGRESS NOTE  Australia Droll WSF:[id number] DOB: [birth year] DOA: 06/27/2019 PCP: Services, [place]  HPI/Recap of past 24 hours: Ms. Usery is a 69 yo F with h/o HTN, anxiety, and MDD who presented with seizure and was found to have a large hemorrhagic L frontal lobe mass. Briefly, she had new onset seizure at [facility] on 5/3 characterized by R-sided convulsions and twitching. EMS arrived and she had another seizure in transit. She was given Ativan and Keppra at [facility] ED. Vitals on presentation significant for elevated BP of 170s/90s, HR 100s, T37. Labs were unremarkable except WBC of 17.2. CT head showed 2.7x2.0x2.8cm hemorrhagic mass in the L frontal lobe. Pt was intubated for airway protection and transferred to [facility] ICU. Pt's son and granddaughter deny history of seizures.   07/04/19: Seen and examined with her son and spouse at bedside.  She feels better today.   Assessment/Plan: Active Problems:   Intracerebral hemorrhage (HCC)   Acute hypoxemic respiratory failure (HCC)    Left frontal lobe hemorrhagic mass s/p resection on 5/6.  Surgical pathology 06/29/19 positive for metastatic lung adenocarcinoma. -appreciate onc eval  Plan Outpatient PET scan Decadron per surgery  Newly diagnosed metastatic lung adenocarcinoma Has a spiculated right middle lobe pulmonary nodule measuring 11 mm. Surgical pathology 06/29/19 returned positive for metastatic lung adenocarcinoma. 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  Acute toxic and metabolic encephalopathy, improving. Suspect some of this reflects a postictal state superimposed on recent brain met Plan Continue serial neuro checks No change in anticonvulsants CIR consult  Abdominal  pain Suspect constipation Plan Regular diet Fleet Enema as needed  Hypertension BP at goal Plan As needed hydralazine  Fluid and electrolyte Imbalance  Plan Replace and recheck PRN  Exophytic thyroid mass  Low TSH Plan TSH 0.124, free T4 normal (06/27/19) PET scan as out-pt   Best practice:   DVT prophylaxis: Subcu heparin 3 times daily  Code Status: FULL  Family Communication: Updated her son Annie Main at bedside..     Dispo: The patient is from: Home              Anticipated d/c is to: CIR              Anticipated d/c date is: next 24 hours or when Neurosurgery signs off.              Patient currently awaiting placement         Objective: Vitals:   07/04/19 0012 07/04/19 0409 07/04/19 0905 07/04/19 1214  BP: 115/65 110/62 112/61 132/72  Pulse: 94 82 88 74  Resp: 18 18 18 16   Temp: (!) 97.4 F (36.3 C) 98.1 F (36.7 C) (!) 97.5 F (36.4 C) 97.9 F (36.6 C)  TempSrc: Oral Oral Oral Oral  SpO2: 99% 97% 99% 98%  Weight:      Height:        Intake/Output Summary (Last 24 hours) at 07/04/2019 1557 Last data filed at 07/04/2019 0410 Gross per 24 hour  Intake 100 ml  Output 900 ml  Net -800 ml   Filed Weights   07/01/19 0500 07/02/19 0500 07/03/19 0409  Weight: 57 kg 57.5 kg 61.6 kg    Exam:  . General: 69 y.o. year-old female well-developed well-nourished in no distress.  Alert and cooperative. . Cardiovascular: Regular rate and rhythm no rubs or gallops.   Marland Kitchen Respiratory: Clear to auscultation no wheezes no rales.  .  Abdomen: Soft nontender nondistended normal bowel sounds present.   . Musculoskeletal: No lower extremity edema bilaterally.   Marland Kitchen Psychiatry: Mood is appropriate for condition and setting.   Data Reviewed: CBC: Recent Labs  Lab 06/30/19 0625 07/01/19 0554 07/02/19 0619 07/03/19 0351 07/04/19 0323  WBC 19.3* 16.1* 14.5* 14.0* 13.1*  HGB 10.7* 10.1* 10.8* 10.7* 10.7*  HCT 32.9* 31.2* 33.0* 32.5* 32.5*  MCV 93.7 93.7  93.0 93.4 93.4  PLT 165 156 189 221 244   Basic Metabolic Panel: Recent Labs  Lab 06/29/19 0635 06/29/19 0635 06/29/19 1627 06/30/19 0625 06/30/19 0635 07/01/19 0554 07/02/19 0619 07/03/19 0351  NA 141   < >  --  147* 140 138 137 139  K 4.0   < >  --  4.4 3.6 3.4* 3.8 3.9  CL 104   < >  --  116* 104 103 101 104  CO2 25   < >  --  19* 23 23 25 27   GLUCOSE 159*   < >  --  104* 144* 124* 147* 165*  BUN 26*   < >  --  19 22 17 21  25*  CREATININE 0.71   < >  --  0.91 0.71 0.64 0.70 0.65  CALCIUM 9.5   < >  --  8.6* 8.7* 8.8* 9.2 8.9  MG 2.2   < > 2.1 2.5* 2.1 2.0 2.0  --   PHOS 2.7  --   --  3.2 3.8 3.0 3.3  --    < > = values in this interval not displayed.   GFR: Estimated Creatinine Clearance: 58.1 mL/min (by C-G formula based on SCr of 0.65 mg/dL). Liver Function Tests: No results for input(s): AST, ALT, ALKPHOS, BILITOT, PROT, ALBUMIN in the last 168 hours. No results for input(s): LIPASE, AMYLASE in the last 168 hours. No results for input(s): AMMONIA in the last 168 hours. Coagulation Profile: No results for input(s): INR, PROTIME in the last 168 hours. Cardiac Enzymes: No results for input(s): CKTOTAL, CKMB, CKMBINDEX, TROPONINI in the last 168 hours. BNP (last 3 results) No results for input(s): PROBNP in the last 8760 hours. HbA1C: No results for input(s): HGBA1C in the last 72 hours. CBG: Recent Labs  Lab 07/01/19 2347 07/02/19 0328 07/02/19 0729 07/02/19 1213 07/02/19 1705  GLUCAP 118* 141* 140* 130* 150*   Lipid Profile: No results for input(s): CHOL, HDL, LDLCALC, TRIG, CHOLHDL, LDLDIRECT in the last 72 hours. Thyroid Function Tests: No results for input(s): TSH, T4TOTAL, FREET4, T3FREE, THYROIDAB in the last 72 hours. Anemia Panel: No results for input(s): VITAMINB12, FOLATE, FERRITIN, TIBC, IRON, RETICCTPCT in the last 72 hours. Urine analysis:    Component Value Date/Time   COLORURINE STRAW (A) 06/26/2019 2200   APPEARANCEUR CLEAR (A) 06/26/2019  2200   LABSPEC 1.016 06/26/2019 2200   PHURINE 8.0 06/26/2019 2200   GLUCOSEU NEGATIVE 06/26/2019 2200   HGBUR NEGATIVE 06/26/2019 2200   BILIRUBINUR NEGATIVE 06/26/2019 2200   KETONESUR NEGATIVE 06/26/2019 2200   PROTEINUR 100 (A) 06/26/2019 2200   NITRITE NEGATIVE 06/26/2019 2200   LEUKOCYTESUR NEGATIVE 06/26/2019 2200   Sepsis Labs: @LABRCNTIP (procalcitonin:4,lacticidven:4)  ) Recent Results (from the past 240 hour(s))  Blood Culture (routine x 2)     Status: None   Collection Time: 06/26/19  8:02 PM   Specimen: BLOOD  Result Value  Ref Range Status   Specimen Description BLOOD LEFT HAND  Final   Special Requests BOTTLES DRAWN AEROBIC AND ANAEROBIC BCAV  Final   Culture   Final    NO GROWTH 5 DAYS Performed at [facility], [address] [place], [place] [address]    Report Status 07/01/2019 FINAL  Final  Blood Culture (routine x 2)     Status: None   Collection Time: 06/26/19  8:02 PM   Specimen: BLOOD  Result Value Ref Range Status   Specimen Description BLOOD LAC  Final   Special Requests BOTTLES DRAWN AEROBIC AND ANAEROBIC [place]  Final   Culture   Final    NO GROWTH 5 DAYS Performed at [facility], [address] [place], [place] [address]    Report Status 07/01/2019 FINAL  Final  Respiratory Panel by RT PCR (Flu A&B, Covid) - Nasopharyngeal Swab     Status: None   Collection Time: 06/26/19  8:42 PM   Specimen: Nasopharyngeal Swab  Result Value Ref Range Status   SARS Coronavirus 2 by RT PCR NEGATIVE NEGATIVE Final    Comment: (NOTE) SARS-CoV-2 target nucleic acids are NOT DETECTED. The SARS-CoV-2 RNA is generally detectable in upper respiratoy specimens during the acute phase of infection. The lowest concentration of SARS-CoV-2 viral copies this assay can detect is 131 copies/mL. A negative result does not preclude SARS-Cov-2 infection and should not be used as the sole basis for treatment or other patient management decisions. A  negative result may occur with  improper specimen collection/handling, submission of specimen other than nasopharyngeal swab, presence of viral mutation(s) within the areas targeted by this assay, and inadequate number of viral copies (<131 copies/mL). A negative result must be combined with clinical observations, patient history, and epidemiological information. The expected result is Negative. Fact Sheet for Patients:  PinkCheek.be Fact Sheet for Healthcare Providers:  GravelBags.it This test is not yet ap proved or cleared by the [place] FDA and  has been authorized for detection and/or diagnosis of SARS-CoV-2 by FDA under an Emergency Use Authorization (EUA). This EUA will remain  in effect (meaning this test can be used) for the duration of the COVID-19 declaration under Section 564(b)(1) of the Act, 21 U.S.C. section 360bbb-3(b)(1), unless the authorization is terminated or revoked sooner.    Influenza A by PCR NEGATIVE NEGATIVE Final   Influenza B by PCR NEGATIVE NEGATIVE Final    Comment: (NOTE) The Xpert Xpress SARS-CoV-2/FLU/RSV assay is intended as an aid in  the diagnosis of influenza from Nasopharyngeal swab specimens and  should not be used as a sole basis for treatment. Nasal washings and  aspirates are unacceptable for Xpert Xpress SARS-CoV-2/FLU/RSV  testing. Fact Sheet for Patients: PinkCheek.be Fact Sheet for Healthcare Providers: GravelBags.it This test is not yet approved or cleared by the [place] FDA and  has been authorized for detection and/or diagnosis of SARS-CoV-2 by  FDA under an Emergency Use Authorization (EUA). This EUA will remain  in effect (meaning this test can be used) for the duration of the  Covid-19 declaration under Section 564(b)(1) of the Act, 21  U.S.C. section 360bbb-3(b)(1), unless the authorization is   terminated or revoked. Performed at [facility], [address] [place], [place] [address]   MRSA PCR Screening     Status: None   Collection Time: 06/27/19  3:52 AM   Specimen: Nasal Mucosa; Nasopharyngeal  Result Value Ref Range Status   MRSA by PCR NEGATIVE NEGATIVE  Final    Comment:        The GeneXpert MRSA Assay (FDA approved for NASAL specimens only), is one component of a comprehensive MRSA colonization surveillance program. It is not intended to diagnose MRSA infection nor to guide or monitor treatment for MRSA infections. Performed at [facility], [place] [address], [place], [place] [address]       Studies: No results found.  Scheduled Meds: . dexamethasone  2 mg Oral Q12H  . docusate  100 mg Oral BID  . feeding supplement (ENSURE ENLIVE)  237 mL Oral TID BM  . feeding supplement (PRO-STAT SUGAR FREE 64)  30 mL Oral BID  . heparin injection (subcutaneous)  5,000 Units Subcutaneous Q8H  . levETIRAcetam  1,000 mg Oral BID  . multivitamin with minerals  1 tablet Oral Daily    Continuous Infusions:    LOS: 7 days     Kayleen Memos, MD [facility] Pager [phone]  If 7PM-7AM, please contact night-coverage [email or web address] Password [facility] 07/04/2019, 3:57 PM

## 2019-07-04 NOTE — Progress Notes (Signed)
Occupational Therapy Treatment Note  Discussed with PT who feels pt may be having diplopia. Interpreter (Tabi) used during session with husband present. Pt reports "seeing 2". States her vision is "much worse than before surgery". Partial occlusion used on R nasal field and pt reports this improves her vision. Pt to use occlusion to improve her functional vision for ADL and mobility. Will continue to follow acutely and adjust appropriately.    07/04/19 1600  OT Visit Information  Last OT Received On 07/04/19  Assistance Needed +1  History of Present Illness 69 year old female with a past medical history significant for hypertension and depression.  The patient presented to the emergency room with new onset of seizure.  CT of the head on admission showed a left frontal lobe hemorrhagic mass with surrounding vasogenic edema and a 2 mm of rightward midline shift. CT chest also demonstrating right middle pulmonary nodule. Pt underwent L frontal craniotomy and tumor resection on 5/6, preliminary pathology showing metastatic adenocarcinoma.  Precautions  Precautions Fall  Precaution Comments impaired vision  Pain Assessment  Pain Assessment Faces  Faces Pain Scale 2  Pain Location headache   Pain Descriptors / Indicators Grimacing  Pain Intervention(s) Limited activity within patient's tolerance  Cognition  Arousal/Alertness Awake/alert  Behavior During Therapy Flat affect  Overall Cognitive Status Impaired/Different from baseline  Area of Impairment Attention  Vision- Assessment  Vision Assessment? Vision impaired- to be further tested in functional context  Additional Comments Pt complaining of double vision. Difficult to assess, however pt appears to be L eye dominant adn when R nasal field occluded, pt reports it helps her vision. Attmepted to educate pt/husband on purpose of partial occlusion to improve funcitonal vision through use of interpreter. Pt/husband verbalized understanding. Pt may  also to be having difficulty with gaze stabilization, however taping appears to help. Will further assess  OT - End of Session  Activity Tolerance Patient tolerated treatment well  Patient left in bed;with call bell/phone within reach;with bed alarm set;with family/visitor present  Nurse Communication Mobility status;Other (comment) (use of partial occlusion)  OT Assessment/Plan  OT Plan Discharge plan remains appropriate  OT Visit Diagnosis Unsteadiness on feet (R26.81);Cognitive communication deficit (R41.841);Low vision, both eyes (H54.2);Muscle weakness (generalized) (M62.81)  Symptoms and signs involving cognitive functions Other cerebrovascular disease  OT Frequency (ACUTE ONLY) Min 2X/week  Recommendations for Other Services Rehab consult  Follow Up Recommendations CIR;Supervision/Assistance - 24 hour  OT Equipment None recommended by OT  AM-PAC OT "6 Clicks" Daily Activity Outcome Measure (Version 2)  Help from another person eating meals? 2  Help from another person taking care of personal grooming? 2  Help from another person toileting, which includes using toliet, bedpan, or urinal? 2  Help from another person bathing (including washing, rinsing, drying)? 2  Help from another person to put on and taking off regular upper body clothing? 2  Help from another person to put on and taking off regular lower body clothing? 1  6 Click Score 11  OT Goal Progression  Progress towards OT goals Progressing toward goals  Acute Rehab OT Goals  Patient Stated Goal Did not state   OT Goal Formulation With patient/family  Time For Goal Achievement 07/17/19  Potential to Achieve Goals Good  ADL Goals  Pt Will Perform Eating with min assist;sitting  Pt Will Perform Grooming with min assist;sitting  Pt Will Perform Upper Body Bathing with min assist;sitting  Pt Will Perform Lower Body Bathing with mod assist;sit to/from stand  Pt  Will Transfer to Toilet with min assist;ambulating;regular  height toilet;bedside commode;grab bars  Pt Will Perform Toileting - Clothing Manipulation and hygiene with min assist;sit to/from stand  OT Time Calculation  OT Start Time (ACUTE ONLY) 1620  OT Stop Time (ACUTE ONLY) 1646  OT Time Calculation (min) 26 min  OT General Charges  $OT Visit 1 Visit  OT Treatments  $Therapeutic Activity 23-37 mins  Maurie Boettcher, OT/L   Acute OT Clinical Specialist Acute Rehabilitation Services Pager [phone] Office [phone]

## 2019-07-04 NOTE — Plan of Care (Signed)
  Problem: Pain Managment: Goal: General experience of comfort will improve Outcome: Progressing   Problem: Safety: Goal: Ability to remain free from injury will improve Outcome: Progressing   

## 2019-07-04 NOTE — Progress Notes (Signed)
  Speech Language Pathology Treatment:    Patient Details Name: Sara Schroeder MRN: [id number] DOB: 1950-12-12 Today's Date: 07/04/2019 Time: 3154-0086 SLP Time Calculation (min) (ACUTE ONLY): 10 min  Assessment / Plan / Recommendation Clinical Impression  Mrs. Critcher was seen for skilled diet tolerance/advancement. She continues with s/s oral apraxia with noted groping behaviors for oral imitation (lip rounding for smile, tongue out for pucker, etc.) She confirmed difficulty chewing and stated she was doing well with puree. She was seen with puree and thin liquids without any difficulty. She had to be encouraged to trial the regular solids. She appeared to do well, without coughing, but was noted with prolonged mastication and some discoordination of bolus cohesion (using tongue around mouth to move bolus posteriorly), r/t apraxia. She required cues to utilize liquid wash for complete oral clearance. Based on current presentation and ongoing apraxia, continue purees and thin liquids. ST to follow for diet adv as lingual/oral control improves.   HPI HPI: 69 yo F presented to [facility] with new onset seizure. 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  Pt underwent L frontral craniotomy for tumor resection on 06/29/19.   Intubated due to recurring seizures.  Pt was intubated from 5/3-5/7.        SLP Plan  Continue with current plan of care  Patient needs continued Speech Lanaguage Pathology Services    Recommendations  Medication Administration: Crushed with puree Compensations: Minimize environmental distractions;Slow rate;Small sips/bites                Oral Care Recommendations: Oral care BID;Staff/trained caregiver to provide oral care Follow up Recommendations: Inpatient Rehab SLP Visit Diagnosis: Dysphagia, unspecified (R13.10) Plan: Continue with current plan of care       Lamekia Nolden P. Taheem Fricke, M.S., CCC-SLP Speech-Language Pathologist Acute Rehabilitation Services Pager: [place] 07/04/2019, 3:19 PM

## 2019-07-04 NOTE — Progress Notes (Signed)
Physical Therapy Treatment Patient Details Name: Sara Schroeder MRN: [id number] DOB: 04-16-1950 Today's Date: 07/04/2019    History of Present Illness 69 year old female with a past medical history significant for hypertension and depression.  The patient presented to the emergency room with new onset of seizure.  CT of the head on admission showed a left frontal lobe hemorrhagic mass with surrounding vasogenic edema and a 2 mm of rightward midline shift. CT chest also demonstrating right middle pulmonary nodule. Pt underwent L frontal craniotomy and tumor resection on 5/6, preliminary pathology showing metastatic adenocarcinoma.    PT Comments    Patient moving better with improved strength on her right. Noted to appear dizzy in standing and via interpreter pt acknowledged she was seeing double, felt dizzy and nauseated. Pt closing rt eye during session. Notified OT re: her reports to see if patching or taping her glasses would be appropriate to decrease symptoms and hopefully allow better tolerance for mobility. OT plans to further assess. Increase time due to pt's delayed responses and use of interpreter (virtual, [place]).     Follow Up Recommendations  CIR;Supervision/Assistance - 24 hour     Equipment Recommendations  Other (comment)(TBD if no post-acute therapy)    Recommendations for Other Services       Precautions / Restrictions Precautions Precautions: Fall Restrictions Weight Bearing Restrictions: No    Mobility  Bed Mobility Overal bed mobility: Needs Assistance Bed Mobility: Supine to Sit     Supine to sit: Min guard     General bed mobility comments: from [facility] elevated, pt raised to long-sitting and then turned to sit at EOB  Transfers Overall transfer level: Needs assistance Equipment used: 1 person hand held assist Transfers: Sit to/from [facility] Sit to Stand: Min assist Stand pivot transfers: Mod assist       General transfer comment: pt  stood and holding tightly to PT's upper arm; appeared dizzy and confirmed via interpreter; rested at EOB then transferred to recliner on her left pt very guarded and reaching prematurely for armrest  Ambulation/Gait             General Gait Details: pt reporting double-vision, dizziness, and nausea. Could not tolerate ambulation   Stairs             Wheelchair Mobility    Modified Rankin (Stroke Patients Only)       Balance Overall balance assessment: Needs assistance Sitting-balance support: No upper extremity supported;Feet unsupported Sitting balance-Leahy Scale: Fair     Standing balance support: Bilateral upper extremity supported Standing balance-Leahy Scale: Poor Standing balance comment: reaching for UE support due to dizziness                            Cognition Arousal/Alertness: Awake/alert Behavior During Therapy: Flat affect Overall Cognitive Status: (not formally assessed beyond oriented person, place, situati)                                 General Comments: followed all commands via interpreter (virtual)      Exercises General Exercises - Lower Extremity Ankle Circles/Pumps: AROM;Both;10 reps;Seated Long Arc Quad: AROM;Right;5 reps;Seated    General Comments General comments (skin integrity, edema, etc.): "father of her children" (as she introduced him) present throughout. Educated both pt and gentleman that he is not to help her get out of chair--must call for assistance and reviewed  how to use call bell to call for assistance.       Pertinent Vitals/Pain Pain Assessment: No/denies pain    Home Living     Available Help at Discharge: Family;Available 24 hours/day Type of Home: House              Prior Function            PT Goals (current goals can now be found in the care plan section) Acute Rehab PT Goals Patient Stated Goal: Did not state  Time For Goal Achievement: 07/15/19 Potential to  Achieve Goals: Good Progress towards PT goals: Not progressing toward goals - comment(dizzy, diplopia, nausea)    Frequency    Min 4X/week      PT Plan Current plan remains appropriate    Co-evaluation              AM-PAC PT "6 Clicks" Mobility   Outcome Measure  Help needed turning from your back to your side while in a flat bed without using bedrails?: A Lot Help needed moving from lying on your back to sitting on the side of a flat bed without using bedrails?: A Little Help needed moving to and from a bed to a chair (including a wheelchair)?: A Lot Help needed standing up from a chair using your arms (e.g., wheelchair or bedside chair)?: A Lot Help needed to walk in hospital room?: Total Help needed climbing 3-5 steps with a railing? : Total 6 Click Score: 11    End of Session Equipment Utilized During Treatment: Gait belt Activity Tolerance: Treatment limited secondary to medical complications (Comment)(diplopia, dizzy, nausea) Patient left: in chair;with call bell/phone within reach;with chair alarm set;with family/visitor present Nurse Communication: Mobility status;Other (comment)(diplopia, dizzy, nauseated) PT Visit Diagnosis: Unsteadiness on feet (R26.81);Other abnormalities of gait and mobility (R26.89);Muscle weakness (generalized) (M62.81);Apraxia (R48.2);Other symptoms and signs involving the nervous system (R29.898)     Time: 0981-1914 PT Time Calculation (min) (ACUTE ONLY): 27 min  Charges:  $Therapeutic Activity: 23-37 mins                      Arby Barrette, PT Pager [phone]    Rexanne Mano 07/04/2019, 3:51 PM

## 2019-07-04 NOTE — Evaluation (Signed)
Speech Language Pathology Evaluation Patient Details Name: Sara Schroeder MRN: [id number] DOB: 1950-10-07 Today's Date: 07/04/2019 Time: 9622-2979 SLP Time Calculation (min) (ACUTE ONLY): 30 min  Problem List:  Patient Active Problem List   Diagnosis Date Noted  . Acute hypoxemic respiratory failure ([place])   . Intracerebral hemorrhage ([place]) 06/27/2019   Past Medical History:  Past Medical History:  Diagnosis Date  . Anxiety   . Hypertension    Past Surgical History:  Past Surgical History:  Procedure Laterality Date  . APPLICATION OF CRANIAL NAVIGATION N/A 06/29/2019   Procedure: APPLICATION OF CRANIAL NAVIGATION;  Surgeon: Judith Part, MD;  Location: [place];  Service: Neurosurgery;  Laterality: N/A;  . CRANIOTOMY Left 06/29/2019   Procedure: LEFT CRANIOTOMY FOR TUMOR EXCISION;  Surgeon: Judith Part, MD;  Location: [place];  Service: Neurosurgery;  Laterality: Left;   HPI:  69 yo F presented to [facility] with new onset seizure. 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  Pt underwent L frontral craniotomy for tumor resection on 06/29/19.   Intubated due to recurring seizures.  Pt was intubated from 5/3-5/7.     Assessment / Plan / Recommendation Clinical Impression  [name] demonstrates at least a moderate expressive/receptive aphasia with suspected cognitive deficits co-occurring (difficult to determine d/t language difference and impairment). Speech-language evaluation was completed with son as interpreter, as iPad interpreter did not have an available person at this time for this language. Pt reported difficulty with speech, but was unable to elaborate. 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 She was provided portions of the [place] Test (MAST) to assess abilities, however, this test is not standardized on non-English speakers. Expressively, she demonstrated deficits in confrontation naming, repetition at sentence level, generative naming. Receptively, she  demonstrated deficits in complex yes/no questions and following 2+ step directions. Strengths include simple naming, following simple directions, and answering simple yes/no questions. Pt answered orientation questions with some difficulty (stated "hospital" but did not know what city she was in). When asked what month it is, she started counting on her fingers and naming all the months, instead of the current month (suspect receptive deficit impacting acc'y). When asked why she was here, she stated, "I got cold all of a sudden, then I was weak." Pt is very motivated, as is family. She is a great candidate for further rehabilitation at intensive level (inpatient rehab).   [place] Aphasia Screening Test: Naming: 3/5 Automatic Speech: 4/10 (deficits r/t comprehension for completing common sentences--son gave common sentences for their language) Repetition: 6/10 Yes/No: 5/10 Objective Recognition: 10/10 Following Instructions: 6/10 Reading/Writing: not assessed 2/2 language difference and not having someone in person to write in her language for reading      SLP Assessment  SLP Recommendation/Assessment: Patient needs continued Speech Lanaguage Pathology Services SLP Visit Diagnosis: Dysphagia, oral phase (R13.11);Aphasia (R47.01)    Follow Up Recommendations  Inpatient Rehab    Frequency and Duration min 2x/week  2 weeks      SLP Evaluation Cognition  Overall Cognitive Status: Impaired/Different from baseline Arousal/Alertness: Awake/alert Orientation Level: Oriented to person Attention: Focused Behaviors: Restless;Impulsive       Comprehension  Auditory Comprehension Overall Auditory Comprehension: Impaired Yes/No Questions: Impaired Complex Questions: 25-49% accurate Commands: Impaired Two Step Basic Commands: 25-49% accurate Multistep Basic Commands: 25-49% accurate Complex Commands: 25-49% accurate Conversation: Complex Visual  Recognition/Discrimination Discrimination: Not tested Reading Comprehension Reading Status: Not tested    Expression Expression Primary Mode of Expression: Nonverbal -  gestures Verbal Expression Overall Verbal Expression: Impaired Initiation: Impaired Automatic Speech: Name;Counting;Day of week Level of Generative/Spontaneous Verbalization: Phrase;Sentence Repetition: Impaired Level of Impairment: Sentence level Naming: Impairment Responsive: 76-100% accurate Confrontation: Impaired Convergent: 50-74% accurate Divergent: 0-24% accurate Verbal Errors: Aware of errors;Semantic paraphasias;Phonemic paraphasias Pragmatics: No impairment Non-Verbal Means of Communication: Gestures   Oral / Motor  Oral Motor/Sensory Function Overall Oral Motor/Sensory Function: Mild impairment Motor Speech Respiration: Within functional limits Phonation: Normal   Lealer Marsland P. Chisom Muntean, M.S., CCC-SLP Speech-Language Pathologist Acute Rehabilitation Services Pager: [place] 07/04/2019, 3:09 PM

## 2019-07-04 NOTE — Progress Notes (Signed)
Neurosurgery Service Progress Note  Subjective: NAE ON, per family she is speaking in longer sentences / using more words  Objective: Vitals:   07/03/19 1629 07/03/19 2000 07/04/19 0012 07/04/19 0409  BP: (!) 108/58 124/65 115/65 110/62  Pulse: 89 94 94 82  Resp: 18 18 18 18   Temp: 97.9 F (36.6 C) 97.7 F (36.5 C) (!) 97.4 F (36.3 C) 98.1 F (36.7 C)  TempSrc: Oral Oral Oral Oral  SpO2: 97% 98% 99% 97%  Weight:      Height:       Temp (24hrs), Avg:97.9 F (36.6 C), Min:97.4 F (36.3 C), Max:98.4 F (36.9 C)  CBC Latest Ref Rng & Units 07/04/2019 07/03/2019 07/02/2019  WBC 4.0 - 10.5 K/uL 13.1(H) 14.0(H) 14.5(H)  Hemoglobin 12.0 - 15.0 g/dL 10.7(L) 10.7(L) 10.8(L)  Hematocrit 36.0 - 46.0 % 32.5(L) 32.5(L) 33.0(L)  Platelets 150 - 400 K/uL 249 221 189   BMP Latest Ref Rng & Units 07/03/2019 07/02/2019 07/01/2019  Glucose 70 - 99 mg/dL 165(H) 147(H) 124(H)  BUN 8 - 23 mg/dL 25(H) 21 17  Creatinine 0.44 - 1.00 mg/dL 0.65 0.70 0.64  Sodium 135 - 145 mmol/L 139 137 138  Potassium 3.5 - 5.1 mmol/L 3.9 3.8 3.4(L)  Chloride 98 - 111 mmol/L 104 101 103  CO2 22 - 32 mmol/L 27 25 23   Calcium 8.9 - 10.3 mg/dL 8.9 9.2 8.8(L)    Intake/Output Summary (Last 24 hours) at 07/04/2019 0731 Last data filed at 07/04/2019 0410 Gross per 24 hour  Intake 100 ml  Output 1100 ml  Net -1000 ml    Current Facility-Administered Medications:  .  acetaminophen (TYLENOL) tablet 650 mg, 650 mg, Oral, Q4H PRN, Judith Part, MD, 650 mg at 07/02/19 2351 .  dexamethasone (DECADRON) injection 4 mg, 4 mg, Intravenous, Q12H, Nyra Anspaugh, Joyice Faster, MD, 4 mg at 07/03/19 2156 .  docusate (COLACE) 50 MG/5ML liquid 100 mg, 100 mg, Oral, BID, Erick Colace, NP, 100 mg at 07/03/19 2156 .  feeding supplement (ENSURE ENLIVE) (ENSURE ENLIVE) liquid 237 mL, 237 mL, Oral, TID BM, Hall, Carole N, DO, 237 mL at 07/03/19 1549 .  feeding supplement (PRO-STAT SUGAR FREE 64) liquid 30 mL, 30 mL, Oral, BID, Hall, Carole  N, DO, 30 mL at 07/03/19 1122 .  heparin injection 5,000 Units, 5,000 Units, Subcutaneous, Q8H, Judith Part, MD, 5,000 Units at 07/04/19 0535 .  levETIRAcetam (KEPPRA) IVPB 1000 mg/100 mL premix, 1,000 mg, Intravenous, Q12H, Erick Colace, NP, Last Rate: 400 mL/hr at 07/03/19 2152, 1,000 mg at 07/03/19 2152 .  LORazepam (ATIVAN) injection 2 mg, 2 mg, Intravenous, Q5 min PRN, Erick Colace, NP, 2 mg at 07/02/19 2356 .  menthol-cetylpyridinium (CEPACOL) lozenge 3 mg, 1 lozenge, Oral, PRN, Judith Part, MD, 3 mg at 07/03/19 2211 .  multivitamin with minerals tablet 1 tablet, 1 tablet, Oral, Daily, Hall, Carole N, DO .  ondansetron (ZOFRAN) injection 4 mg, 4 mg, Intravenous, Q6H PRN, Erick Colace, NP .  polyethylene glycol (MIRALAX / GLYCOLAX) packet 17 g, 17 g, Per Tube, Daily PRN, Erick Colace, NP, 17 g at 07/02/19 1725   Physical Exam: Awake/alert, occasionally speaking a few words at a time, comprehension intact, R side 4/5, L 5/5 Incision c/d/i  Assessment & Plan: 69 y.o. woman p/w seizures, MRI brain with left frontal 23x96mm hemorrhagic mass, CT CAP with pulmonary nodule with some growth since 2020 CT. 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

## 2019-07-05 ENCOUNTER — Encounter: Payer: Self-pay | Admitting: *Deleted

## 2019-07-05 ENCOUNTER — Inpatient Hospital Stay (HOSPITAL_COMMUNITY): Payer: Medicare Other

## 2019-07-05 ENCOUNTER — Other Ambulatory Visit: Payer: Self-pay | Admitting: Radiation Therapy

## 2019-07-05 DIAGNOSIS — C3411 Malignant neoplasm of upper lobe, right bronchus or lung: Secondary | ICD-10-CM

## 2019-07-05 DIAGNOSIS — E041 Nontoxic single thyroid nodule: Secondary | ICD-10-CM | POA: Diagnosis not present

## 2019-07-05 DIAGNOSIS — C7931 Secondary malignant neoplasm of brain: Secondary | ICD-10-CM | POA: Diagnosis not present

## 2019-07-05 DIAGNOSIS — J9601 Acute respiratory failure with hypoxia: Secondary | ICD-10-CM | POA: Diagnosis not present

## 2019-07-05 LAB — PROCALCITONIN: Procalcitonin: <0.1 ng/mL

## 2019-07-05 IMAGING — DX DG CHEST 1V PORT
1 series · 1 of 1 positions shown · non-contrast
Comparison: Chest radiograph [DATE], chest CT [DATE]

CLINICAL DATA: [place]. Additional provided: Patient recently
presented with seizure was found to have a large hemorrhagic left
frontal lobe mass.

EXAM:
PORTABLE CHEST 1 VIEW

[chest ap]
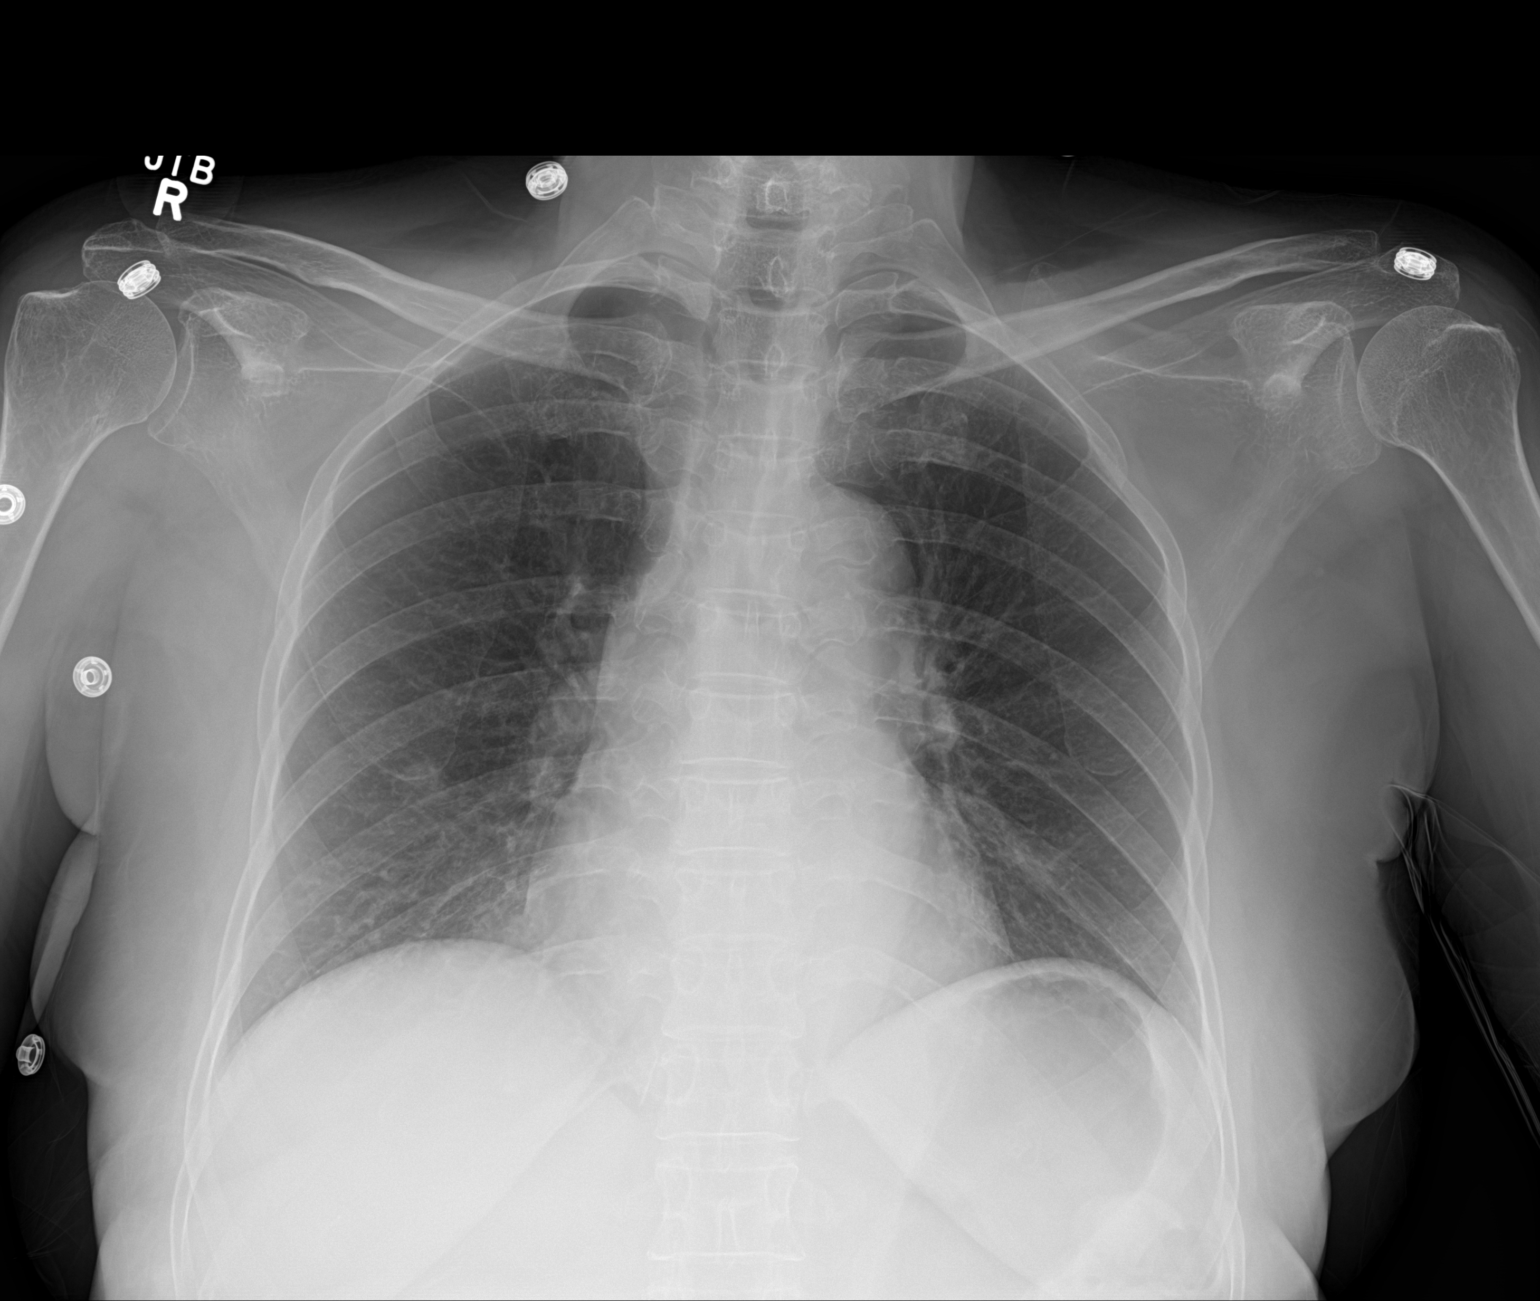

[1 of 1 positions shown; findings below may reference images not displayed]

FINDINGS: Heart size within normal limits. Mild ill-defined opacity at the
right lung base. Redemonstrated right middle lobe lung nodule better
characterized on chest CT [DATE]. The left lung is clear. No
evidence of pleural effusion or pneumothorax. No acute bony
abnormality identified.
IMPRESSION: Mild ill-defined opacity at the right lung base which may reflect
atelectasis or pneumonia.

Redemonstrated right middle lobe pulmonary nodule better
characterized on prior chest CT [DATE].

## 2019-07-05 MED ORDER — NYSTATIN 100000 UNIT/ML MT SUSP
5.0000 mL | Freq: Four times a day (QID) | OROMUCOSAL | Status: DC
  Administered 2019-07-05 – 2019-07-07 (×8): 500000 [IU] via ORAL
  Filled 2019-07-05 (×8): qty 5

## 2019-07-05 MED ORDER — MAGIC MOUTHWASH W/LIDOCAINE
5.0000 mL | Freq: Three times a day (TID) | ORAL | Status: DC | PRN
  Filled 2019-07-05: qty 5

## 2019-07-05 NOTE — Progress Notes (Signed)
Physical Therapy Treatment Patient Details Name: Sara Schroeder MRN: [id number] DOB: 1951-02-05 Today's Date: 07/05/2019    History of Present Illness 69 year old female with a past medical history significant for hypertension and depression.  The patient presented to the emergency room with new onset of seizure.  CT of the head on admission showed a left frontal lobe hemorrhagic mass with surrounding vasogenic edema and a 2 mm of rightward midline shift. CT chest also demonstrating right middle pulmonary nodule. Pt underwent L frontal craniotomy and tumor resection on 5/6, preliminary pathology showing metastatic adenocarcinoma.    PT Comments    Patient seen in co-treatment with OT to further address vision and impact on mobility. Use of virtual interpreter throughout session, however pt often with difficulty describing her symptoms (of dizziness or decr vision). She was noted to open Rt eye when wearing glasses with partially occluded rt lens (taped medially) and holds rt eye closed when glasses removed. Able to walk to the sink with +2 assist and RW. Continues to report "spinning" dizziness and noted to have drop in BP from sit to stand. RN made aware.     Follow Up Recommendations  CIR;Supervision/Assistance - 24 hour     Equipment Recommendations  Other (comment)(TBD if no post-acute therapy)    Recommendations for Other Services       Precautions / Restrictions Precautions Precautions: Fall Precaution Comments: impaired vision Restrictions Weight Bearing Restrictions: No    Mobility  Bed Mobility Overal bed mobility: Needs Assistance Bed Mobility: Supine to Sit     Supine to sit: Min assist     General bed mobility comments: from [facility] elevated, pt raised to long-sitting and then turned to sit at EOB with repeated cues/gestures (would initiate and stop movement multiple times)  Transfers Overall transfer level: Needs assistance Equipment used: Rolling walker (2  wheeled) Transfers: Sit to/from Stand Sit to Stand: Min assist         General transfer comment: initial attempt to stand pt returned to sitting stating she was unable to stand; via interpreter "too dizzy"; with RW stood x 2 (also with +2 for safety and perhaps pt felt more secure)  Ambulation/Gait Ambulation/Gait assistance: Min assist;+2 physical assistance;+2 safety/equipment Gait Distance (Feet): 5 Feet Assistive device: Rolling walker (2 wheeled) Gait Pattern/deviations: Step-through pattern;Decreased stride length;Shuffle;Trunk flexed Gait velocity: very slow   General Gait Details: pt required incr assist to maneuver RW around counter/sink on her rt   Chief Strategy Officer    Modified Rankin (Stroke Patients Only)       Balance Overall balance assessment: Needs assistance Sitting-balance support: No upper extremity supported;Feet unsupported Sitting balance-Leahy Scale: Fair Sitting balance - Comments: able to maintain EOB sitting with min gaurd assist    Standing balance support: Bilateral upper extremity supported Standing balance-Leahy Scale: Poor                              Cognition Arousal/Alertness: Awake/alert Behavior During Therapy: Flat affect Overall Cognitive Status: Impaired/Different from baseline Area of Impairment: Attention;Following commands;Awareness;Problem solving                   Current Attention Level: Sustained   Following Commands: Follows one step commands with increased time   Awareness: Intellectual Problem Solving: Slow processing;Decreased initiation;Difficulty sequencing;Requires verbal cues;Requires tactile cues General Comments: unable to figure out how to scoot rt  side back into seat of chair; ?inattention to rt      Exercises      General Comments General comments (skin integrity, edema, etc.): Continues to report "spinning dizziness" that improves with sitting. Assessed  orthostatics with +drop from sitting to standing. (SBP 119 to 89)      Pertinent Vitals/Pain Pain Assessment: Faces Faces Pain Scale: Hurts a little bit Pain Location: throat (from coughing) Pain Descriptors / Indicators: Discomfort Pain Intervention(s): Monitored during session    Home Living                      Prior Function            PT Goals (current goals can now be found in the care plan section) Acute Rehab PT Goals Patient Stated Goal: Did not state  Time For Goal Achievement: 07/15/19 Potential to Achieve Goals: Good Progress towards PT goals: Progressing toward goals    Frequency    Min 4X/week      PT Plan Current plan remains appropriate    Co-evaluation              AM-PAC PT "6 Clicks" Mobility   Outcome Measure  Help needed turning from your back to your side while in a flat bed without using bedrails?: A Little Help needed moving from lying on your back to sitting on the side of a flat bed without using bedrails?: A Little Help needed moving to and from a bed to a chair (including a wheelchair)?: A Little Help needed standing up from a chair using your arms (e.g., wheelchair or bedside chair)?: A Little Help needed to walk in hospital room?: A Lot Help needed climbing 3-5 steps with a railing? : Total 6 Click Score: 15    End of Session Equipment Utilized During Treatment: Gait belt Activity Tolerance: Treatment limited secondary to medical complications (Comment)(dizziness, BP) Patient left: in chair;with call bell/phone within reach;with chair alarm set;with family/visitor present;Other (comment)(with OT) Nurse Communication: Mobility status(use of glasses for decr diplopia; dizzy; orthostatic) PT Visit Diagnosis: Unsteadiness on feet (R26.81);Other abnormalities of gait and mobility (R26.89);Muscle weakness (generalized) (M62.81);Apraxia (R48.2);Other symptoms and signs involving the nervous system (R29.898)     Time:  2774-1287 PT Time Calculation (min) (ACUTE ONLY): 38 min  Charges:  $Therapeutic Activity: 8-22 mins                      Arby Barrette, PT Pager [phone]    Rexanne Mano 07/05/2019, 1:01 PM

## 2019-07-05 NOTE — Progress Notes (Signed)
Neurosurgery Service Progress Note  Subjective: NAE ON, per family she continues to have increased verbal output in [place]. Per them, she is mildly dyusarthric but normal tonality and content.  Objective: Vitals:   07/04/19 1956 07/05/19 0002 07/05/19 0454 07/05/19 0854  BP: 114/64 (!) 120/58 (!) 114/96 122/69  Pulse: 72 92 84 83  Resp: 18 18 18 20   Temp: 98 F (36.7 C) 98.1 F (36.7 C) 98.1 F (36.7 C) 97.8 F (36.6 C)  TempSrc: Oral Oral Oral Oral  SpO2: 98% 97% 96% 99%  Weight:      Height:       Temp (24hrs), Avg:98.1 F (36.7 C), Min:97.8 F (36.6 C), Max:98.5 F (36.9 C)  CBC Latest Ref Rng & Units 07/04/2019 07/03/2019 07/02/2019  WBC 4.0 - 10.5 K/uL 13.1(H) 14.0(H) 14.5(H)  Hemoglobin 12.0 - 15.0 g/dL 10.7(L) 10.7(L) 10.8(L)  Hematocrit 36.0 - 46.0 % 32.5(L) 32.5(L) 33.0(L)  Platelets 150 - 400 K/uL 249 221 189   BMP Latest Ref Rng & Units 07/03/2019 07/02/2019 07/01/2019  Glucose 70 - 99 mg/dL 165(H) 147(H) 124(H)  BUN 8 - 23 mg/dL 25(H) 21 17  Creatinine 0.44 - 1.00 mg/dL 0.65 0.70 0.64  Sodium 135 - 145 mmol/L 139 137 138  Potassium 3.5 - 5.1 mmol/L 3.9 3.8 3.4(L)  Chloride 98 - 111 mmol/L 104 101 103  CO2 22 - 32 mmol/L 27 25 23   Calcium 8.9 - 10.3 mg/dL 8.9 9.2 8.8(L)    Intake/Output Summary (Last 24 hours) at 07/05/2019 0925 Last data filed at 07/05/2019 0454 Gross per 24 hour  Intake -  Output 850 ml  Net -850 ml    Current Facility-Administered Medications:  .  acetaminophen (TYLENOL) tablet 650 mg, 650 mg, Oral, Q4H PRN, Judith Part, MD, 650 mg at 07/05/19 0811 .  dexamethasone (DECADRON) tablet 2 mg, 2 mg, Oral, Q12H, Zanylah Hardie, Joyice Faster, MD, 2 mg at 07/04/19 2128 .  docusate (COLACE) 50 MG/5ML liquid 100 mg, 100 mg, Oral, BID, Erick Colace, NP, 100 mg at 07/04/19 2128 .  feeding supplement (ENSURE ENLIVE) (ENSURE ENLIVE) liquid 237 mL, 237 mL, Oral, TID BM, Hall, Carole N, DO, 237 mL at 07/04/19 1352 .  feeding supplement (PRO-STAT SUGAR  FREE 64) liquid 30 mL, 30 mL, Oral, BID, Hall, Carole N, DO, 30 mL at 07/04/19 2128 .  heparin injection 5,000 Units, 5,000 Units, Subcutaneous, Q8H, Judith Part, MD, 5,000 Units at 07/05/19 0602 .  levETIRAcetam (KEPPRA) 100 MG/ML solution 1,000 mg, 1,000 mg, Oral, BID, Irene Pap N, DO, 1,000 mg at 07/04/19 2128 .  LORazepam (ATIVAN) injection 2 mg, 2 mg, Intravenous, Q5 min PRN, Erick Colace, NP, 2 mg at 07/02/19 2356 .  magic mouthwash w/lidocaine, 5 mL, Oral, TID PRN, Irene Pap N, DO .  menthol-cetylpyridinium (CEPACOL) lozenge 3 mg, 1 lozenge, Oral, PRN, Judith Part, MD, 3 mg at 07/04/19 1843 .  multivitamin with minerals tablet 1 tablet, 1 tablet, Oral, Daily, Irene Pap N, DO, 1 tablet at 07/04/19 0855 .  ondansetron (ZOFRAN) injection 4 mg, 4 mg, Intravenous, Q6H PRN, Salvadore Dom E, NP .  polyethylene glycol (MIRALAX / GLYCOLAX) packet 17 g, 17 g, Per Tube, Daily PRN, Erick Colace, NP, 17 g at 07/04/19 7253   Physical Exam: Awake/alert, occasionally speaking a few words at a time to family, comprehension intact, R side 4+/5, L 5/5 Incision c/d/i  Assessment & Plan: 69 y.o. woman p/w seizures, MRI brain with left frontal 23x61mm  hemorrhagic mass, CT CAP with pulmonary nodule with some growth since 2020 CT. 5/6 s/p crani for rsxn, 5/7 post-op MRI GTR  -exam improving as expected, I think her language should return to baseline or close to baseline given how fast it is recovering. -continue keppra, d/c dex -will discuss at tumor board, but if she has one lesion that was a gross total resection and it's NSCLC, I do not think she will require any further treatment to her resection cavity, just q41mo MRIs -family aware of pathology, I explained how complicated and individualized treatment can be in the modern genetic era and encouraged them to write down questions to discuss with Dr. Nicholas Lose A Darrelle Barrell  07/05/19 9:25 AM

## 2019-07-05 NOTE — Progress Notes (Signed)
PROGRESS NOTE  Sara Schroeder JSE:[id number] DOB: [birth year] DOA: 06/27/2019 PCP: [facility], [place]  HPI/Recap of past 24 hours: [name] is a 69 yo F with h/o HTN, anxiety, and MDD who presented with seizure and was found to have a large hemorrhagic L frontal lobe mass. Briefly, she had new onset seizure at [facility] on 5/3 characterized by R-sided convulsions and twitching. EMS arrived and she had another seizure in transit. She was given Ativan and Keppra at [facility] ED. Vitals on presentation significant for elevated BP of 170s/90s, HR 100s, T37. Labs were unremarkable except WBC of 17.2. CT head showed 2.7x2.0x2.8cm hemorrhagic mass in the L frontal lobe. Pt was intubated for airway protection and transferred to [facility] ICU. Pt's son and granddaughter deny history of seizures.   Post left frontal lobe hemorrhagic mass resection on 06/29/19 by Dr. Zada Finders, neurosurgery.  Path returned positive for metastatic lung carcinoma.  Oncology following.  07/05/19: Seen and examined.  Persistent cough noted in her room.  Chest x-ray ordered.  No clear evidence of pulmonary infiltrates.  Will obtain a procalcitonin.  Also reports odynophagia.  Oral thrush noted.  Will start oral nystatin QID x 3 days.    Assessment/Plan: Active Problems:   Intracerebral hemorrhage (HCC)   Acute hypoxemic respiratory failure (HCC)    Left frontal lobe hemorrhagic mass s/p resection on 5/6.  Surgical pathology 06/29/19 positive for metastatic lung adenocarcinoma. -appreciate onc eval  Plan Outpatient PET scan Completed course of Decadron by neurosurgery  Newly diagnosed metastatic lung adenocarcinoma Has a spiculated right middle lobe pulmonary nodule measuring 11 mm. Surgical pathology 06/29/19 returned positive for metastatic lung adenocarcinoma. 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  Oral thrush/ odynophagia Started oral nystatin QID x 3 days Continue to monitor  Acute toxic and metabolic encephalopathy, improving. Suspect some of this reflects a postictal state superimposed on recent brain met Plan Continue serial neuro checks No change in anticonvulsants CIR consult  Resolved Abdominal pain Suspect constipation Plan Regular diet Fleet Enema as needed  Hypertension BP at goal Plan As needed hydralazine  Fluid and electrolyte Imbalance  Plan Replace and recheck PRN  Exophytic thyroid mass  Low TSH Plan TSH 0.124, free T4 normal (06/27/19) PET scan as out-pt   Best practice:   DVT prophylaxis: Subcu heparin 3 times daily  Code Status: FULL  Family Communication: Updated her son Annie Main at bedside..     Dispo: The patient is from: Home              Anticipated d/c is to: CIR              Anticipated d/c date is: when Neurosurgery and oncology sign off.              Patient currently awaiting placement         Objective: Vitals:   07/05/19 1116 07/05/19 1200 07/05/19 1205 07/05/19 1219  BP: 118/63 (!) 119/92 (!) 89/71 109/71  Pulse: 80 (!) 113 (!) 101 93  Resp: 20     Temp: 98.7 F (37.1 C)     TempSrc: Oral     SpO2: 97%     Weight:      Height:        Intake/Output Summary (Last  24 hours) at 07/05/2019 1617 Last data filed at 07/05/2019 0454 Gross per 24 hour  Intake --  Output 850 ml  Net -850 ml   Filed Weights   07/01/19 0500 07/02/19 0500 07/03/19 0409  Weight: 57 kg 57.5 kg 61.6 kg    Exam:  . General: 70 y.o. year-old female well-developed well-nourished in no acute distress.  Alert and cooperative.   . Cardiovascular: Regular rate and rhythm no rubs or gallops.   Marland Kitchen Respiratory: Clear to auscultation no wheezes or rales.  .  Abdomen: Soft normal bowel sounds present.    . Musculoskeletal: No lower extremity edema bilaterally..   . Psychiatry: Mood is appropriate for condition and setting.  Data Reviewed: CBC: Recent Labs  Lab 06/30/19 0625 07/01/19 0554 07/02/19 0619 07/03/19 0351 07/04/19 0323  WBC 19.3* 16.1* 14.5* 14.0* 13.1*  HGB 10.7* 10.1* 10.8* 10.7* 10.7*  HCT 32.9* 31.2* 33.0* 32.5* 32.5*  MCV 93.7 93.7 93.0 93.4 93.4  PLT 165 156 189 221 710   Basic Metabolic Panel: Recent Labs  Lab 06/29/19 0635 06/29/19 0635 06/29/19 1627 06/30/19 0625 06/30/19 0635 07/01/19 0554 07/02/19 0619 07/03/19 0351  NA 141   < >  --  147* 140 138 137 139  K 4.0   < >  --  4.4 3.6 3.4* 3.8 3.9  CL 104   < >  --  116* 104 103 101 104  CO2 25   < >  --  19* _0 GLUCOSE 159*   < >  --  104* 144* 124* 147* 165*  BUN 26*   < >  --  _1 25*  CREATININE 0.71   < >  --  0.91 0.71 0.64 0.70 0.65  CALCIUM 9.5   < >  --  8.6* 8.7* 8.8* 9.2 8.9  MG 2.2   < > 2.1 2.5* 2.1 2.0 2.0  --   PHOS 2.7  --   --  3.2 3.8 3.0 3.3  --    < > = values in this interval not displayed.   GFR: Estimated Creatinine Clearance: 58.1 mL/min (by C-G formula based on SCr of 0.65 mg/dL). Liver Function Tests: No results for input(s): AST, ALT, ALKPHOS, BILITOT, PROT, ALBUMIN in the last 168 hours. No results for input(s): LIPASE, AMYLASE in the last 168 hours. No results for input(s): AMMONIA in the last 168 hours. Coagulation Profile: No results for input(s): INR, PROTIME in the last 168 hours. Cardiac Enzymes: No results for input(s): CKTOTAL, CKMB, CKMBINDEX, TROPONINI in the last 168 hours. BNP (last 3 results) No results for input(s): PROBNP in the last 8760 hours. HbA1C: No results for input(s): HGBA1C in the last 72 hours. CBG: Recent Labs  Lab 07/01/19 2347 07/02/19 0328 07/02/19 0729 07/02/19 1213 07/02/19 1705  GLUCAP 118* 141* 140* 130* 150*   Lipid Profile: No results for input(s): CHOL, HDL, LDLCALC, TRIG, CHOLHDL, LDLDIRECT in the  last 72 hours. Thyroid Function Tests: No results for input(s): TSH, T4TOTAL, FREET4, T3FREE, THYROIDAB in the last 72 hours. Anemia Panel: No results for input(s): VITAMINB12, FOLATE, FERRITIN, TIBC, IRON, RETICCTPCT in the last 72 hours. Urine analysis:    Component Value Date/Time   COLORURINE STRAW (A) 06/26/2019 2200   APPEARANCEUR CLEAR (A) 06/26/2019 2200   LABSPEC 1.016 06/26/2019 2200   PHURINE 8.0 06/26/2019 2200   GLUCOSEU NEGATIVE 06/26/2019 2200   HGBUR NEGATIVE 06/26/2019 2200   BILIRUBINUR NEGATIVE 06/26/2019 2200   KETONESUR  NEGATIVE 06/26/2019 2200   PROTEINUR 100 (A) 06/26/2019 2200   NITRITE NEGATIVE 06/26/2019 2200   LEUKOCYTESUR NEGATIVE 06/26/2019 2200   Sepsis Labs: _0 (procalcitonin:4,lacticidven:4)  ) Recent Results (from the past 240 hour(s))  Blood Culture (routine x 2)     Status: None   Collection Time: 06/26/19  8:02 PM   Specimen: BLOOD  Result Value Ref Range Status   Specimen Description BLOOD LEFT HAND  Final   Special Requests BOTTLES DRAWN AEROBIC AND ANAEROBIC BCAV  Final   Culture   Final    NO GROWTH 5 DAYS Performed at [facility], [address] [place], [place] [address]    Report Status 07/01/2019 FINAL  Final  Blood Culture (routine x 2)     Status: None   Collection Time: 06/26/19  8:02 PM   Specimen: BLOOD  Result Value Ref Range Status   Specimen Description BLOOD LAC  Final   Special Requests BOTTLES DRAWN AEROBIC AND ANAEROBIC [place]  Final   Culture   Final    NO GROWTH 5 DAYS Performed at [facility], [address] [place], [place] [address]    Report Status 07/01/2019 FINAL  Final  Respiratory Panel by RT PCR (Flu A&B, Covid) - Nasopharyngeal Swab     Status: None   Collection Time: 06/26/19  8:42 PM   Specimen: Nasopharyngeal Swab  Result Value Ref Range Status   SARS Coronavirus 2 by RT PCR NEGATIVE NEGATIVE Final    Comment: (NOTE) SARS-CoV-2 target nucleic acids are NOT  DETECTED. The SARS-CoV-2 RNA is generally detectable in upper respiratoy specimens during the acute phase of infection. The lowest concentration of SARS-CoV-2 viral copies this assay can detect is 131 copies/mL. A negative result does not preclude SARS-Cov-2 infection and should not be used as the sole basis for treatment or other patient management decisions. A negative result may occur with  improper specimen collection/handling, submission of specimen other than nasopharyngeal swab, presence of viral mutation(s) within the areas targeted by this assay, and inadequate number of viral copies (<131 copies/mL). A negative result must be combined with clinical observations, patient history, and epidemiological information. The expected result is Negative. Fact Sheet for Patients:  PinkCheek.be Fact Sheet for Healthcare Providers:  GravelBags.it This test is not yet ap proved or cleared by the [place] FDA and  has been authorized for detection and/or diagnosis of SARS-CoV-2 by FDA under an Emergency Use Authorization (EUA). This EUA will remain  in effect (meaning this test can be used) for the duration of the COVID-19 declaration under Section 564(b)(1) of the Act, 21 U.S.C. section 360bbb-3(b)(1), unless the authorization is terminated or revoked sooner.    Influenza A by PCR NEGATIVE NEGATIVE Final   Influenza B by PCR NEGATIVE NEGATIVE Final    Comment: (NOTE) The Xpert Xpress SARS-CoV-2/FLU/RSV assay is intended as an aid in  the diagnosis of influenza from Nasopharyngeal swab specimens and  should not be used as a sole basis for treatment. Nasal washings and  aspirates are unacceptable for Xpert Xpress SARS-CoV-2/FLU/RSV  testing. Fact Sheet for Patients: PinkCheek.be Fact Sheet for Healthcare Providers: GravelBags.it This test is not yet approved or cleared  by the [place] FDA and  has been authorized for detection and/or diagnosis of SARS-CoV-2 by  FDA under an Emergency Use Authorization (EUA). This EUA will remain  in effect (meaning this test can be used) for the duration of the  Covid-19 declaration under Section 564(b)(1) of the Act,  21  U.S.C. section 360bbb-3(b)(1), unless the authorization is  terminated or revoked. Performed at [facility], [address] [place], [place] [address]   MRSA PCR Screening     Status: None   Collection Time: 06/27/19  3:52 AM   Specimen: Nasal Mucosa; Nasopharyngeal  Result Value Ref Range Status   MRSA by PCR NEGATIVE NEGATIVE Final    Comment:        The GeneXpert MRSA Assay (FDA approved for NASAL specimens only), is one component of a comprehensive MRSA colonization surveillance program. It is not intended to diagnose MRSA infection nor to guide or monitor treatment for MRSA infections. Performed at [facility], [place] [address] [place], [place] [address]       Studies: DG CHEST PORT 1 VIEW  Result Date: 07/05/2019 CLINICAL DATA:  Rales. Additional provided: Patient recently presented with seizure was found to have a large hemorrhagic left frontal lobe mass. EXAM: PORTABLE CHEST 1 VIEW COMPARISON:  Chest radiograph 06/28/2019, chest CT 06/27/2019 FINDINGS: Heart size within normal limits. Mild ill-defined opacity at the right lung base. Redemonstrated right middle lobe lung nodule better characterized on chest CT 06/27/2019. The left lung is clear. No evidence of pleural effusion or pneumothorax. No acute bony abnormality identified. IMPRESSION: Mild ill-defined opacity at the right lung base which may reflect atelectasis or pneumonia. Redemonstrated right middle lobe pulmonary nodule better characterized on prior chest CT 06/27/2019. Electronically Signed   By: Kellie Simmering DO   On: 07/05/2019 11:06    Scheduled Meds: . docusate  100 mg Oral BID  . feeding  supplement (ENSURE ENLIVE)  237 mL Oral TID BM  . feeding supplement (PRO-STAT SUGAR FREE 64)  30 mL Oral BID  . heparin injection (subcutaneous)  5,000 Units Subcutaneous Q8H  . levETIRAcetam  1,000 mg Oral BID  . multivitamin with minerals  1 tablet Oral Daily    Continuous Infusions:    LOS: 8 days     Kayleen Memos, MD [facility] Pager [phone]  If 7PM-7AM, please contact night-coverage [email or web address] Password [facility] 07/05/2019, 4:17 PM

## 2019-07-05 NOTE — Progress Notes (Signed)
Occupational Therapy Treatment Patient Details Name: Sara Schroeder MRN: [id number] DOB: 06-Feb-1951 Today's Date: 07/05/2019    History of present illness 69 year old female with a past medical history significant for hypertension and depression.  The patient presented to the emergency room with new onset of seizure.  CT of the head on admission showed a left frontal lobe hemorrhagic mass with surrounding vasogenic edema and a 2 mm of rightward midline shift. CT chest also demonstrating right middle pulmonary nodule. Pt underwent L frontal craniotomy and tumor resection on 5/6, preliminary pathology showing metastatic adenocarcinoma.   OT comments  Pt progressing towards acute OT goals. Able to walk from EOB to sink with min A +2 for physical assist and safety. Session limited by onset of dizziness found to be orthostatic. Pt noted to keep R eye closed unless wearing modified R medially occluded glasses. D/c plan remains appropriate.    Follow Up Recommendations  CIR;Supervision/Assistance - 24 hour    Equipment Recommendations  None recommended by OT    Recommendations for Other Services      Precautions / Restrictions Precautions Precautions: Fall Precaution Comments: impaired vision, orthostatic Restrictions Weight Bearing Restrictions: No       Mobility Bed Mobility Overal bed mobility: Needs Assistance Bed Mobility: Supine to Sit     Supine to sit: Min assist     General bed mobility comments: from [facility] elevated, pt raised to long-sitting and then turned to sit at EOB with repeated cues/gestures (would initiate and stop movement multiple times)  Transfers Overall transfer level: Needs assistance Equipment used: Rolling walker (2 wheeled) Transfers: Sit to/from Stand Sit to Stand: Min assist         General transfer comment: initial attempt to stand pt returned to sitting stating she was unable to stand; via interpreter "too dizzy"; with RW stood x 2 (also with +2 for  safety and perhaps pt felt more secure)    Balance Overall balance assessment: Needs assistance Sitting-balance support: No upper extremity supported;Feet unsupported Sitting balance-Leahy Scale: Fair Sitting balance - Comments: able to maintain EOB sitting with min gaurd assist    Standing balance support: Bilateral upper extremity supported Standing balance-Leahy Scale: Poor Standing balance comment: reaching for UE support due to dizziness                           ADL either performed or assessed with clinical judgement   ADL Overall ADL's : Needs assistance/impaired Eating/Feeding: Set up;Minimal assistance;Sitting Eating/Feeding Details (indicate cue type and reason): assist to open containers. Able to load spoon and bring food to mouth with no physical assist. suspect needing assist for full meal consumption 2/2 fatigue.                     Toilet Transfer: Moderate assistance;+2 for safety/equipment;Stand-pivot           Functional mobility during ADLs: Moderate assistance;Rolling walker General ADL Comments: able to walk to sink with mod A +2 safety and use of walker but session limited by onset of dizziness     Vision   Vision Assessment?: Vision impaired- to be further tested in functional context Additional Comments: noted to open Rt eye when wearing glasses with partially occluded rt lens (taped medially) and holds rt eye closed when glasses removed. She reports feeling the glasses help her be less dizzy,   Perception     Praxis      Cognition Arousal/Alertness: Awake/alert  Behavior During Therapy: Flat affect Overall Cognitive Status: Impaired/Different from baseline Area of Impairment: Attention;Following commands;Awareness;Problem solving                   Current Attention Level: Sustained   Following Commands: Follows one step commands with increased time   Awareness: Intellectual Problem Solving: Slow processing;Decreased  initiation;Difficulty sequencing;Requires verbal cues;Requires tactile cues General Comments: unable to figure out how to scoot rt side back into seat of chair; ?inattention to rt        Exercises     Shoulder Instructions       General Comments Continues to report "spinning dizziness" that improves with sitting. Assessed orthostatics with +drop from sitting to standing. (SBP 119 to 89). Pt reporting a few times throughout session she is not sleeping well at night.    Pertinent Vitals/ Pain       Pain Assessment: Faces Faces Pain Scale: Hurts a little bit Pain Location: throat (from coughing) Pain Descriptors / Indicators: Discomfort Pain Intervention(s): Monitored during session  Home Living                                          Prior Functioning/Environment              Frequency  Min 2X/week        Progress Toward Goals  OT Goals(current goals can now be found in the care plan section)  Progress towards OT goals: Progressing toward goals  Acute Rehab OT Goals Patient Stated Goal: Did not state  OT Goal Formulation: With patient/family Time For Goal Achievement: 07/17/19 Potential to Achieve Goals: Good ADL Goals Pt Will Perform Eating: with min assist;sitting Pt Will Perform Grooming: with min assist;sitting Pt Will Perform Upper Body Bathing: with min assist;sitting Pt Will Perform Lower Body Bathing: with mod assist;sit to/from stand Pt Will Transfer to Toilet: with min assist;ambulating;regular height toilet;bedside commode;grab bars Pt Will Perform Toileting - Clothing Manipulation and hygiene: with min assist;sit to/from stand Additional ADL Goal #1: using partial occlusion, pt will verbalize improvement of vision/reduce symptoms of diplopia to increase independence and reduce risk of falls  Plan Discharge plan remains appropriate    Co-evaluation    PT/OT/SLP Co-Evaluation/Treatment: Yes Reason for Co-Treatment: To address  functional/ADL transfers;For patient/therapist safety;Complexity of the patient's impairments (multi-system involvement)   OT goals addressed during session: ADL's and self-care      AM-PAC OT "6 Clicks" Daily Activity     Outcome Measure   Help from another person eating meals?: A Little Help from another person taking care of personal grooming?: A Lot Help from another person toileting, which includes using toliet, bedpan, or urinal?: A Lot Help from another person bathing (including washing, rinsing, drying)?: A Lot Help from another person to put on and taking off regular upper body clothing?: A Lot Help from another person to put on and taking off regular lower body clothing?: Total 6 Click Score: 12    End of Session Equipment Utilized During Treatment: Gait belt;Rolling walker  OT Visit Diagnosis: Unsteadiness on feet (R26.81);Cognitive communication deficit (R41.841);Low vision, both eyes (H54.2);Muscle weakness (generalized) (M62.81) Symptoms and signs involving cognitive functions: Other cerebrovascular disease   Activity Tolerance Other (comment)(symptomatic orthostatic hypotension)   Patient Left in chair;with call bell/phone within reach;with chair alarm set;with family/visitor present   Nurse Communication Mobility status;Other (comment)(orthostatic and dizzy OOB)  Time: 3846-6599 OT Time Calculation (min): 41 min  Charges: OT General Charges $OT Visit: 1 Visit OT Treatments $Self Care/Home Management : 8-22 mins Tyrone Schimke, OT Acute Rehabilitation Services Pager: [phone] Office: [phone]    Hortencia Pilar 07/05/2019, 2:41 PM

## 2019-07-05 NOTE — Progress Notes (Signed)
Inpatient Rehab Admissions Coordinator:   Met with pt and her spouse at bedside.  Her son, Day-Day, interpreted over the phone.  I let them know that we do not have any beds available today.  Pending completion of medical workup may be able to admit in the next few days pending bed availability.   Shann Medal, PT, DPT Admissions Coordinator [phone] 07/05/19  1:05 PM

## 2019-07-05 NOTE — Progress Notes (Signed)
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  This is not surprising.  I have to believe that given that she is Asian in a nonsmoker, that she will have an EGFR mutation associated with her cancer.  We will send off molecular markers and seeing what we have.  One of her sons was with her this morning.  I explained to him what needed to be done.  She needs radiation therapy to the brain now that she has had the tumor resected.  I will know if this is whole brain radiation or this will be stereotactic radiosurgery.  I am sure Radiation Oncology will decide this.  We then need to see what the extent of her systemic diseases.  By her scans, she does has the one nodule in the right lung.  We really need to get a PET scan on her.  Unfortunately, this cannot be done with her in the hospital.  This will have to be done when she is discharged.  I would think that if the PET scan does not show any evidence of disease outside of the lung, then she would be a candidate for curative resection if her performance status improves.  If she is not a candidate for curative resection, then I would consider stereotactic radiosurgery as an option.  If she does have an EGFR mutation, then we could certainly consider using an oral agent to treat her.  She does seem to be still weak.  I do not know she is going to go to rehab when she is discharged.  I will send the tumor off for molecular analysis.  This probably will take about 10 days to return.  I will call Radiation Oncology so they can see her to plan for radiation therapy to the brain.  Lattie Haw, MD  Oswaldo Milian 55:8

## 2019-07-05 NOTE — Progress Notes (Signed)
Per Dr Antonieta Pert request, request for [facility] testing sent on specimen [id number] [facility] 06/29/2019

## 2019-07-06 ENCOUNTER — Ambulatory Visit
Admission: RE | Admit: 2019-07-06 | Discharge: 2019-07-06 | Disposition: A | Discharge status: Home / Self Care | Payer: Medicare Other | Source: Ambulatory Visit | Attending: Radiation Oncology | Admitting: Radiation Oncology

## 2019-07-06 ENCOUNTER — Other Ambulatory Visit: Payer: Self-pay | Admitting: Radiation Oncology

## 2019-07-06 DIAGNOSIS — C7931 Secondary malignant neoplasm of brain: Secondary | ICD-10-CM | POA: Insufficient documentation

## 2019-07-06 DIAGNOSIS — J9601 Acute respiratory failure with hypoxia: Secondary | ICD-10-CM | POA: Diagnosis not present

## 2019-07-06 DIAGNOSIS — Z510 Encounter for antineoplastic radiation therapy: Secondary | ICD-10-CM | POA: Insufficient documentation

## 2019-07-06 DIAGNOSIS — C342 Malignant neoplasm of middle lobe, bronchus or lung: Secondary | ICD-10-CM

## 2019-07-06 DIAGNOSIS — C3401 Malignant neoplasm of right main bronchus: Secondary | ICD-10-CM | POA: Insufficient documentation

## 2019-07-06 LAB — CBC WITH DIFFERENTIAL/PLATELET
Abs Immature Granulocytes: 0.36 10*3/uL — ABNORMAL HIGH (ref 0.00–0.07)
Basophils Absolute: 0 10*3/uL (ref 0.0–0.1)
Basophils Relative: 0 %
Eosinophils Absolute: 0.3 10*3/uL (ref 0.0–0.5)
Eosinophils Relative: 2 %
HCT: 37.9 % (ref 36.0–46.0)
Hemoglobin: 12.2 g/dL (ref 12.0–15.0)
Immature Granulocytes: 3 %
Lymphocytes Relative: 17 %
Lymphs Abs: 2.1 10*3/uL (ref 0.7–4.0)
MCH: 30.6 pg (ref 26.0–34.0)
MCHC: 32.2 g/dL (ref 30.0–36.0)
MCV: 95 fL (ref 80.0–100.0)
Monocytes Absolute: 0.7 10*3/uL (ref 0.1–1.0)
Monocytes Relative: 6 %
Neutro Abs: 9.2 10*3/uL — ABNORMAL HIGH (ref 1.7–7.7)
Neutrophils Relative %: 72 %
Platelets: 310 10*3/uL (ref 150–400)
RBC: 3.99 MIL/uL (ref 3.87–5.11)
RDW: 13.3 % (ref 11.5–15.5)
WBC: 12.7 10*3/uL — ABNORMAL HIGH (ref 4.0–10.5)
nRBC: 0 % (ref 0.0–0.2)

## 2019-07-06 LAB — BASIC METABOLIC PANEL
Anion gap: 9 (ref 5–15)
BUN: 20 mg/dL (ref 8–23)
CO2: 30 mmol/L (ref 22–32)
Calcium: 9.2 mg/dL (ref 8.9–10.3)
Chloride: 100 mmol/L (ref 98–111)
Creatinine, Ser: 0.71 mg/dL (ref 0.44–1.00)
GFR calc Af Amer: >60 mL/min (ref >60)
GFR calc non Af Amer: >60 mL/min (ref >60)
Glucose, Bld: 123 mg/dL — ABNORMAL HIGH (ref 70–99)
Potassium: 4.1 mmol/L (ref 3.5–5.1)
Sodium: 139 mmol/L (ref 135–145)

## 2019-07-06 MED ORDER — FLUCONAZOLE 200 MG PO TABS
200.0000 mg | ORAL_TABLET | Freq: Once | ORAL | Status: AC
  Administered 2019-07-06: 200 mg via ORAL
  Filled 2019-07-06: qty 1

## 2019-07-06 MED ORDER — FLUCONAZOLE 100 MG PO TABS
100.0000 mg | ORAL_TABLET | Freq: Every day | ORAL | Status: DC
  Administered 2019-07-07: 100 mg via ORAL
  Filled 2019-07-06: qty 1

## 2019-07-06 MED ORDER — ENOXAPARIN SODIUM 40 MG/0.4ML ~~LOC~~ SOLN
40.0000 mg | SUBCUTANEOUS | Status: DC
  Administered 2019-07-06: 40 mg via SUBCUTANEOUS
  Filled 2019-07-06: qty 0.4

## 2019-07-06 NOTE — H&P (Signed)
Physical Medicine and Rehabilitation Admission H&P     HPI: Sara Schroeder is a 69 year old right-handed female with history of anxiety and hypertension as well as history of pulmonary nodule January 2020.  History taken from chart review and sign due to cognition and language.  She presented on 06/27/2019 with seizures and aphasia.  Prior to admission patient was living with spouse and family.  Two-level home bed and bath on main level.  Cranial CT scan showed left frontal hemorrhagic mass with surrounding vasogenic edema and a 2 mm rightward midline shift.  MRI solitary 2.6 cm left lateral frontal mass compatible with hemorrhagic neoplasm. CT of the chest abdomen pelvis showed spiculated right middle lobe pulmonary nodule measuring 11 x 11 x 10 mm.  No other evidence of metastatic disease.  Patient underwent left frontal craniotomy with tumor resection on 06/29/19 per Dr. Venetia Constable.  She was extubated on 06/24/2019.  EEG did not show seizure activity.  Keppra maintained for seizure prophylaxis.  Oncology services Dr. 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  Radiation oncology consulted with plans for radiation therapy to the brain.  Plan PET scan as outpatient.  Decadron protocol as indicated and course has been completed.  Tolerating a dysphagia #1 thin liquid diet.  Subcutaneous Lovenox was initiated for DVT prophylaxis 07/03/2019.  Patient with oral thrush completing course of Diflucan.  Therapy evaluations completed and patient was admitted for a comprehensive rehab program.  Please see preadmission assessment from earlier today as well.  Review of Systems  Unable to perform ROS: Language   Past Medical History:  Diagnosis Date  . Anxiety   . Hypertension    Past Surgical History:  Procedure Laterality Date  . APPLICATION OF CRANIAL NAVIGATION N/A 06/29/2019   Procedure: APPLICATION OF CRANIAL NAVIGATION;  Surgeon:  Judith Part, MD;  Location: [place];  Service: Neurosurgery;  Laterality: N/A;  . CRANIOTOMY Left 06/29/2019   Procedure: LEFT CRANIOTOMY FOR TUMOR EXCISION;  Surgeon: Judith Part, MD;  Location: [place];  Service: Neurosurgery;  Laterality: Left;   History reviewed. No pertinent family history on file, unable to obtain from patient. Social History:  reports that she has never smoked. She has never used smokeless tobacco. She reports that she does not drink alcohol or use drugs. Allergies:  Allergies  Allergen Reactions  . Lisinopril Other (See Comments)    Per med list from [facility]    Medications Prior to Admission  Medication Sig Dispense Refill  . dicyclomine (BENTYL) 20 MG tablet Take 20 mg by mouth 4 (four) times daily as needed for spasms (abdominal pain).    Marland Kitchen FLUoxetine (PROZAC) 20 MG capsule Take 20 mg by mouth daily.    . hydrochlorothiazide (HYDRODIURIL) 25 MG tablet Take 25 mg by mouth daily.    Marland Kitchen LORazepam (ATIVAN) 0.5 MG tablet Take 0.25 mg by mouth at bedtime as needed for anxiety.    . melatonin 1 MG TABS tablet Take 1-3 mg by mouth at bedtime as needed (sleep).    . methylcellulose (CITRUCEL) oral powder Take 1 packet by mouth daily. Mix with 8oz of water or juice    . omeprazole (PRILOSEC) 20 MG capsule Take 20 mg by mouth daily.    . pravastatin (PRAVACHOL) 20 MG tablet Take 20 mg by mouth every evening.    . propranolol (INDERAL) 10 MG tablet Take 10 mg by mouth 2 (two) times daily.    Marland Kitchen  traZODone (DESYREL) 50 MG tablet Take 100 mg by mouth at bedtime.      Drug Regimen Review Drug regimen was reviewed and remains appropriate with no significant issues identified  Home: Home Living Family/patient expects to be discharged to:: Private residence Living Arrangements: Spouse/significant other Available Help at Discharge: Family, Available 24 hours/day Type of Home: House Home Access: Level entry Home Layout: Two level, Able to  live on main level with bedroom/bathroom Alternate Level Stairs-Number of Steps: flight Bathroom Shower/Tub: Optometrist: Yes Home Equipment: None  Lives With: Spouse   Functional History: Prior Function Level of Independence: Independent Comments: pt walks around neighborhood with spouse often. Per son pt takes bird baths  Functional Status:  Mobility: Bed Mobility Overal bed mobility: Needs Assistance Bed Mobility: Supine to Sit Supine to sit: Min guard, HOB elevated General bed mobility comments: pt able to long sit in bed; cues for sequencing to get to EOB Transfers Overall transfer level: Needs assistance Equipment used: Rolling walker (2 wheeled) Transfers: Sit to/from Stand Sit to Stand: Min assist Stand pivot transfers: Mod assist General transfer comment: pt stood X 2 from EOB with min A to power up and for balance upon standing (2 person hand held assist use to progress mobility) Ambulation/Gait Ambulation/Gait assistance: Min assist, +2 physical assistance, +2 safety/equipment Gait Distance (Feet): (~60 ft) Assistive device: 2 person hand held assist Gait Pattern/deviations: Step-through pattern, Decreased stride length, Shuffle, Trunk flexed, Narrow base of support General Gait Details: assist for balance; cues for increased bilat step lengths; no change in gait noted with cues Gait velocity: very slow    ADL: ADL Overall ADL's : Needs assistance/impaired Eating/Feeding: Set up, Minimal assistance, Sitting Eating/Feeding Details (indicate cue type and reason): assist to open containers. Able to load spoon and bring food to mouth with no physical assist. suspect needing assist for full meal consumption 2/2 fatigue. Grooming: Oral care, Maximal assistance, Standing Grooming Details (indicate cue type and reason): Pt able to identiy toothbrush, but unable to sequence toothpaste or wetting of toothbrush without  cues to complete in appropriate order Upper Body Bathing: Maximal assistance, Sitting Lower Body Bathing: Maximal assistance, Sit to/from stand Upper Body Dressing : Maximal assistance, Sitting Lower Body Dressing: Sitting/lateral leans, Minimal assistance, Min guard Lower Body Dressing Details (indicate cue type and reason): Donning socks, increased cues to adjust sock on R foot, increased prompting to initiate task, socks had to be placed in pt's hands to initiate Toilet Transfer: Moderate assistance, +2 for safety/equipment, Stand-pivot, Minimal assistance Toilet Transfer Details (indicate cue type and reason): HHA with PT simulated to recliner Toileting- Clothing Manipulation and Hygiene: Maximal assistance, Sit to/from stand Functional mobility during ADLs: +2 for physical assistance, +2 for safety/equipment, Moderate assistance General ADL Comments: HHA of 2 to walk, would not ambulate without 2 person assist, (would grip onto objects) decreased reports of dizziness, however continued deficits in vision which is difficult to determine due to language deficit  Cognition: Cognition Overall Cognitive Status: Impaired/Different from baseline Arousal/Alertness: Awake/alert Orientation Level: Oriented to person, Other (comment) Attention: Focused Behaviors: Restless, Impulsive Cognition Arousal/Alertness: Awake/alert Behavior During Therapy: Flat affect Overall Cognitive Status: Impaired/Different from baseline Area of Impairment: Attention, Following commands, Problem solving, Safety/judgement, Awareness, Memory Orientation Level: Disoriented to, Time, Situation Current Attention Level: Sustained Memory: Decreased recall of precautions, Decreased short-term memory Following Commands: Follows one step commands with increased time, Follows one step commands inconsistently Safety/Judgement: Decreased awareness of safety, Decreased awareness of  deficits Awareness: Intellectual Problem  Solving: Slow processing, Decreased initiation, Difficulty sequencing, Requires verbal cues, Requires tactile cues General Comments: pt with slow processing and at times not responding to commands or questions; pt able to complete mobility tasks when initiated by therapists Difficult to assess due to: Non-English speaking, Impaired communication  Physical Exam: Blood pressure 116/62, pulse (!) 110, temperature 98.5 F (36.9 C), temperature source Oral, resp. rate 18, height 5\' 2"  (1.575 m), weight 61.6 kg, SpO2 98 %. Physical Exam  Vitals reviewed. Constitutional: She appears well-developed and well-nourished.  HENT:  Head: Normocephalic and atraumatic.  Eyes: Right eye exhibits no discharge. Left eye exhibits no discharge. No scleral icterus.  Neck: No tracheal deviation present. No thyromegaly present.  Respiratory: Effort normal. No stridor. No respiratory distress.  GI: Soft. She exhibits no distension.  Musculoskeletal:     Comments: No edema or tenderness in extremities  Neurological: She is alert.  Patient is alert makes eye contact with examiner.  Examination limited by language barrier as well as cognition.   Unable to consistently follow commands Motor: Bilateral upper extremities appear to be 5/5 proximal distal Bilateral lower extremities, limited due to comprehension, but moving freely  Skin: Skin is warm and dry.  Psychiatric:  Appears to be blunted, delayed, slowed   Results for orders placed or performed during the hospital encounter of 06/27/19 (from the past 48 hour(s))  Procalcitonin     Status: None   Collection Time: 07/05/19  5:26 PM  Result Value Ref Range   Procalcitonin <0.10 ng/mL    Comment:        Interpretation: PCT (Procalcitonin) <= 0.5 ng/mL: Systemic infection (sepsis) is not likely. Local bacterial infection is possible. (NOTE)       Sepsis PCT Algorithm           Lower Respiratory Tract                                      Infection PCT  Algorithm    ----------------------------     ----------------------------         PCT < 0.25 ng/mL                PCT < 0.10 ng/mL         Strongly encourage             Strongly discourage   discontinuation of antibiotics    initiation of antibiotics    ----------------------------     -----------------------------       PCT 0.25 - 0.50 ng/mL            PCT 0.10 - 0.25 ng/mL               OR       >80% decrease in PCT            Discourage initiation of                                            antibiotics      Encourage discontinuation           of antibiotics    ----------------------------     -----------------------------         PCT >= 0.50 ng/mL  PCT 0.26 - 0.50 ng/mL               AND        <80% decrease in PCT             Encourage initiation of                                             antibiotics       Encourage continuation           of antibiotics    ----------------------------     -----------------------------        PCT >= 0.50 ng/mL                  PCT > 0.50 ng/mL               AND         increase in PCT                  Strongly encourage                                      initiation of antibiotics    Strongly encourage escalation           of antibiotics                                     -----------------------------                                           PCT <= 0.25 ng/mL                                                 OR                                        > 80% decrease in PCT                                     Discontinue / Do not initiate                                             antibiotics Performed at [facility], [address] [place], [place] [address]   CBC with Differential/Platelet     Status: Abnormal   Collection Time: 07/06/19  7:02 AM  Result Value Ref Range   WBC 12.7 (H) 4.0 - 10.5 K/uL   RBC 3.99 3.87 - 5.11 MIL/uL   Hemoglobin 12.2 12.0 - 15.0 g/dL   HCT 37.9 36.0 - 46.0 %   MCV 95.0 80.0 -  100.0 fL   MCH 30.6  26.0 - 34.0 pg   MCHC 32.2 30.0 - 36.0 g/dL   RDW 13.3 11.5 - 15.5 %   Platelets 310 150 - 400 K/uL   nRBC 0.0 0.0 - 0.2 %   Neutrophils Relative % 72 %   Neutro Abs 9.2 (H) 1.7 - 7.7 K/uL   Lymphocytes Relative 17 %   Lymphs Abs 2.1 0.7 - 4.0 K/uL   Monocytes Relative 6 %   Monocytes Absolute 0.7 0.1 - 1.0 K/uL   Eosinophils Relative 2 %   Eosinophils Absolute 0.3 0.0 - 0.5 K/uL   Basophils Relative 0 %   Basophils Absolute 0.0 0.0 - 0.1 K/uL   Immature Granulocytes 3 %   Abs Immature Granulocytes 0.36 (H) 0.00 - 0.07 K/uL    Comment: Performed at [facility] [address], [place], [place] [address]  Basic metabolic panel     Status: Abnormal   Collection Time: 07/06/19  7:02 AM  Result Value Ref Range   Sodium 139 135 - 145 mmol/L   Potassium 4.1 3.5 - 5.1 mmol/L   Chloride 100 98 - 111 mmol/L   CO2 30 22 - 32 mmol/L   Glucose, Bld 123 (H) 70 - 99 mg/dL    Comment: Glucose reference range applies only to samples taken after fasting for at least 8 hours.   BUN 20 8 - 23 mg/dL   Creatinine, Ser 0.71 0.44 - 1.00 mg/dL   Calcium 9.2 8.9 - 10.3 mg/dL   GFR calc non Af Amer >60 >60 mL/min   GFR calc Af Amer >60 >60 mL/min   Anion gap 9 5 - 15    Comment: Performed at [facility] [address] [place], [place] [address]  Basic metabolic panel     Status: Abnormal   Collection Time: 07/07/19  6:44 AM  Result Value Ref Range   Sodium 138 135 - 145 mmol/L   Potassium 4.0 3.5 - 5.1 mmol/L   Chloride 102 98 - 111 mmol/L   CO2 26 22 - 32 mmol/L   Glucose, Bld 140 (H) 70 - 99 mg/dL    Comment: Glucose reference range applies only to samples taken after fasting for at least 8 hours.   BUN 24 (H) 8 - 23 mg/dL   Creatinine, Ser 0.75 0.44 - 1.00 mg/dL   Calcium 9.5 8.9 - 10.3 mg/dL   GFR calc non Af Amer >60 >60 mL/min   GFR calc Af Amer >60 >60 mL/min   Anion gap 10 5 - 15    Comment: Performed at [facility] [address],  [place], [place] [address]  CBC with Differential/Platelet     Status: Abnormal   Collection Time: 07/07/19  6:44 AM  Result Value Ref Range   WBC 12.5 (H) 4.0 - 10.5 K/uL   RBC 3.97 3.87 - 5.11 MIL/uL   Hemoglobin 12.1 12.0 - 15.0 g/dL   HCT 37.6 36.0 - 46.0 %   MCV 94.7 80.0 - 100.0 fL   MCH 30.5 26.0 - 34.0 pg   MCHC 32.2 30.0 - 36.0 g/dL   RDW 13.4 11.5 - 15.5 %   Platelets 305 150 - 400 K/uL   nRBC 0.0 0.0 - 0.2 %   Neutrophils Relative % 71 %   Neutro Abs 8.8 (H) 1.7 - 7.7 K/uL   Lymphocytes Relative 20 %   Lymphs Abs 2.6 0.7 - 4.0 K/uL   Monocytes Relative 5 %   Monocytes  Absolute 0.6 0.1 - 1.0 K/uL   Eosinophils Relative 1 %   Eosinophils Absolute 0.2 0.0 - 0.5 K/uL   Basophils Relative 0 %   Basophils Absolute 0.0 0.0 - 0.1 K/uL   Immature Granulocytes 3 %   Abs Immature Granulocytes 0.33 (H) 0.00 - 0.07 K/uL    Comment: Performed at [facility] [address] [place],  [address]   No results found. Medical Problem List and Plan: 1.  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  Status post left frontal craniotomy tumor resection 06/29/2019.  Patient will follow up outpatient oncology services Dr. Marin Olp as well as radiation oncology Dr. Mickeal Skinner  -patient may not shower  -ELOS/Goals: 8-13 days/supervision/Min A  Admit to CIR 2.  Antithrombotics: -DVT/anticoagulation: Subcutaneous Lovenox  -antiplatelet therapy: N/A 3. Pain Management: Tylenol as needed  Monitor headaches with increased exertion 4. Mood: Provide emotional support  -antipsychotic agents: N/A 5. Neuropsych: This patient is not fully capable of making decisions on her own behalf. 6. Skin/Wound Care: Routine skin checks 7. Fluids/Electrolytes/Nutrition: Routine in and outs.  CMP ordered. 8.  Seizure disorder.  Continue Keppra 1000 mg twice daily.  EEG negative 9.  Oral thrush.  Complete course of Diflucan 10.  Tachycardia.  ECG  on 5/3 showing atrial flutter, await official read  Repeat ECG ordered 11.  Leukocytosis-likely secondary to steroids  CBC ordered  Cathlyn Parsons, PA-C 07/07/2019  I have personally performed a face to face diagnostic evaluation, including, but not limited to relevant history and physical exam findings, of this patient and developed relevant assessment and plan.  Additionally, I have reviewed and concur with the physician assistant's documentation above.  Delice Lesch, MD, ABPMR

## 2019-07-06 NOTE — Progress Notes (Signed)
PROGRESS NOTE  Sara Schroeder JJH:[id number] DOB: [birth year] DOA: 06/27/2019 PCP: Services, [place]  HPI/Recap of past 24 hours: Sara Schroeder is a 69 yo F with h/o HTN, anxiety, and MDD who presented with seizure and was found to have a large hemorrhagic L frontal lobe mass. Briefly, she had new onset seizure at [facility] on 5/3 characterized by R-sided convulsions and twitching. EMS arrived and she had another seizure in transit. She was given Ativan and Keppra at [facility] ED. Vitals on presentation significant for elevated BP of 170s/90s, HR 100s, T37. Labs were unremarkable except WBC of 17.2. CT head showed 2.7x2.0x2.8cm hemorrhagic mass in the L frontal lobe. Pt was intubated for airway protection and transferred to [facility] ICU. Pt's son and granddaughter deny history of seizures.   Post left frontal lobe hemorrhagic mass resection on 06/29/19 by Dr. Zada Finders, neurosurgery.  Path returned positive for metastatic lung carcinoma.  Oncology following.  07/06/19: Seen and examined with her son Sara Schroeder and husband at bedside.  She is more talkative today.  Reports odynophagia.  She has oral candidiasis.  Started on oral fluconazole today.  We will continue nystatin mouthwash.    Assessment/Plan: Active Problems:   Intracerebral hemorrhage (HCC)   Acute hypoxemic respiratory failure (HCC)    Left frontal lobe hemorrhagic mass s/p resection on 5/6.  Surgical pathology 06/29/19 positive for metastatic lung adenocarcinoma. 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 Surgical pathology 06/29/19 returned positive for metastatic lung adenocarcinoma. Plan per oncology Will need a PET scan outpatient EGFR mutation assessment, molecular markers sent off by  oncology Possible radiation oncology involvement outpatient.  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  Oral thrush/ odynophagia Start oral Diflucan 200 mg daily x 1 then 100 mg daily x 13 days Continue oral nystatin QID x 3 days  Cough Likely in the setting of lung cancer Personally reviewed chest x-ray which showed no clear evidence of pulmonary infiltrates Showing atelectasis, patient advised to use incentive spirometer and flutter valve. Procalcitonin less than 0.10  Leukocytosis likely reactive in the setting of recent steroids WBC is trending down  Improving acute toxic and metabolic encephalopathy. Suspect some of this reflects a postictal state superimposed on recent brain met Plan Continue to treat underlying condition CIR consult  Resolved Abdominal pain Suspect constipation Plan Regular diet Fleet Enema as needed  Hypertension BP at goal Plan As needed hydralazine  Fluid and electrolyte Imbalance  Plan Replace and recheck PRN  Exophytic thyroid mass  Low TSH Plan TSH 0.124, free T4 normal (06/27/19) PET scan as out-pt   Ambulatory dysfunction PT OT recommended CIR Plan to discharge to CIR once a bed is available Continue PT OT with assistance and fall precautions  Best practice:   DVT prophylaxis: Subcu Lovenox daily  Code Status: FULL  Family Communication: Updated her son Sara Schroeder at bedside.    Dispo: The patient is from: Home              Anticipated d/c is to: CIR  Anticipated d/c date is: Pending bed availability at CIR.              Patient currently awaiting placement         Objective: Vitals:   07/05/19 2347 07/06/19 0400 07/06/19 0726 07/06/19 1134  BP: 121/73 111/70  116/72 112/69  Pulse: (!) 103 100 100 97  Resp: 17 16 16 18   Temp: 97.8 F (36.6 C) 99.5 F (37.5 C) 98.4 F (36.9 C) 98.7 F (37.1 C)  TempSrc: Oral Oral Oral Oral  SpO2: 94% 95% 97% 96%  Weight:      Height:        Intake/Output Summary (Last 24 hours) at 07/06/2019 1205 Last data filed at 07/06/2019 0900 Gross per 24 hour  Intake 120 ml  Output 600 ml  Net -480 ml   Filed Weights   07/01/19 0500 07/02/19 0500 07/03/19 0409  Weight: 57 kg 57.5 kg 61.6 kg    Exam:  . General: 69 y.o. year-old female well-developed well-nourished in no acute distress.  Alert cooperative and talkative. . Cardiovascular: Regular rate and rhythm no rubs or gallops.  No JVD or thyromegaly noted.  Marland Kitchen Respiratory: Clear to auscultation with no wheezes or rales. .  Abdomen: Soft nontender normal bowel sounds present. . Musculoskeletal: No lower extremity edema bilaterally. Marland Kitchen Psychiatry: Mood is appropriate for condition and setting.   Data Reviewed: CBC: Recent Labs  Lab 07/01/19 0554 07/02/19 0619 07/03/19 0351 07/04/19 0323 07/06/19 0702  WBC 16.1* 14.5* 14.0* 13.1* 12.7*  NEUTROABS  --   --   --   --  9.2*  HGB 10.1* 10.8* 10.7* 10.7* 12.2  HCT 31.2* 33.0* 32.5* 32.5* 37.9  MCV 93.7 93.0 93.4 93.4 95.0  PLT 156 189 221 249 098   Basic Metabolic Panel: Recent Labs  Lab 06/29/19 1627 06/30/19 0625 06/30/19 0625 06/30/19 1191 07/01/19 0554 07/02/19 0619 07/03/19 0351 07/06/19 0702  NA  --  147*   < > 140 138 137 139 139  K  --  4.4   < > 3.6 3.4* 3.8 3.9 4.1  CL  --  116*   < > 104 103 101 104 100  CO2  --  19*   < > 23 23 25 27 30   GLUCOSE  --  104*   < > 144* 124* 147* 165* 123*  BUN  --  19   < > 22 17 21  25* 20  CREATININE  --  0.91   < > 0.71 0.64 0.70 0.65 0.71  CALCIUM  --  8.6*   < > 8.7* 8.8* 9.2 8.9 9.2  MG 2.1 2.5*  --  2.1 2.0 2.0  --   --   PHOS  --  3.2  --  3.8 3.0 3.3  --   --    < > = values in this interval not displayed.   GFR: Estimated Creatinine  Clearance: 58.1 mL/min (by C-G formula based on SCr of 0.71 mg/dL). Liver Function Tests: No results for input(s): AST, ALT, ALKPHOS, BILITOT, PROT, ALBUMIN in the last 168 hours. No results for input(s): LIPASE, AMYLASE in the last 168 hours. No results for input(s): AMMONIA in the last 168 hours. Coagulation Profile: No results for input(s): INR, PROTIME in the last 168 hours. Cardiac Enzymes: No results for input(s): CKTOTAL, CKMB, CKMBINDEX, TROPONINI in the last 168 hours. BNP (last 3 results) No results for input(s): PROBNP in the last 8760 hours. HbA1C: No results for input(s): HGBA1C in  the last 72 hours. CBG: Recent Labs  Lab 07/01/19 2347 07/02/19 0328 07/02/19 0729 07/02/19 1213 07/02/19 1705  GLUCAP 118* 141* 140* 130* 150*   Lipid Profile: No results for input(s): CHOL, HDL, LDLCALC, TRIG, CHOLHDL, LDLDIRECT in the last 72 hours. Thyroid Function Tests: No results for input(s): TSH, T4TOTAL, FREET4, T3FREE, THYROIDAB in the last 72 hours. Anemia Panel: No results for input(s): VITAMINB12, FOLATE, FERRITIN, TIBC, IRON, RETICCTPCT in the last 72 hours. Urine analysis:    Component Value Date/Time   COLORURINE STRAW (A) 06/26/2019 2200   APPEARANCEUR CLEAR (A) 06/26/2019 2200   LABSPEC 1.016 06/26/2019 2200   PHURINE 8.0 06/26/2019 2200   GLUCOSEU NEGATIVE 06/26/2019 2200   HGBUR NEGATIVE 06/26/2019 2200   BILIRUBINUR NEGATIVE 06/26/2019 2200   KETONESUR NEGATIVE 06/26/2019 2200   PROTEINUR 100 (A) 06/26/2019 2200   NITRITE NEGATIVE 06/26/2019 2200   LEUKOCYTESUR NEGATIVE 06/26/2019 2200   Sepsis Labs: @LABRCNTIP (procalcitonin:4,lacticidven:4)  ) Recent Results (from the past 240 hour(s))  Blood Culture (routine x 2)     Status: None   Collection Time: 06/26/19  8:02 PM   Specimen: BLOOD  Result Value Ref Range Status   Specimen Description BLOOD LEFT HAND  Final   Special Requests BOTTLES DRAWN AEROBIC AND ANAEROBIC BCAV  Final   Culture   Final     NO GROWTH 5 DAYS Performed at [facility], [address] [place], [place] [address]    Report Status 07/01/2019 FINAL  Final  Blood Culture (routine x 2)     Status: None   Collection Time: 06/26/19  8:02 PM   Specimen: BLOOD  Result Value Ref Range Status   Specimen Description BLOOD LAC  Final   Special Requests BOTTLES DRAWN AEROBIC AND ANAEROBIC [place]  Final   Culture   Final    NO GROWTH 5 DAYS Performed at [facility], [address] [place], [place] [address]    Report Status 07/01/2019 FINAL  Final  Respiratory Panel by RT PCR (Flu A&B, Covid) - Nasopharyngeal Swab     Status: None   Collection Time: 06/26/19  8:42 PM   Specimen: Nasopharyngeal Swab  Result Value Ref Range Status   SARS Coronavirus 2 by RT PCR NEGATIVE NEGATIVE Final    Comment: (NOTE) SARS-CoV-2 target nucleic acids are NOT DETECTED. The SARS-CoV-2 RNA is generally detectable in upper respiratoy specimens during the acute phase of infection. The lowest concentration of SARS-CoV-2 viral copies this assay can detect is 131 copies/mL. A negative result does not preclude SARS-Cov-2 infection and should not be used as the sole basis for treatment or other patient management decisions. A negative result may occur with  improper specimen collection/handling, submission of specimen other than nasopharyngeal swab, presence of viral mutation(s) within the areas targeted by this assay, and inadequate number of viral copies (<131 copies/mL). A negative result must be combined with clinical observations, patient history, and epidemiological information. The expected result is Negative. Fact Sheet for Patients:  PinkCheek.be Fact Sheet for Healthcare Providers:  GravelBags.it This test is not yet ap proved or cleared by the [place] FDA and  has been authorized for detection and/or diagnosis of SARS-CoV-2 by FDA under an Emergency  Use Authorization (EUA). This EUA will remain  in effect (meaning this test can be used) for the duration of the COVID-19 declaration under Section 564(b)(1) of the Act, 21 U.S.C. section 360bbb-3(b)(1), unless the authorization is terminated or revoked sooner.    Influenza A by  PCR NEGATIVE NEGATIVE Final   Influenza B by PCR NEGATIVE NEGATIVE Final    Comment: (NOTE) The Xpert Xpress SARS-CoV-2/FLU/RSV assay is intended as an aid in  the diagnosis of influenza from Nasopharyngeal swab specimens and  should not be used as a sole basis for treatment. Nasal washings and  aspirates are unacceptable for Xpert Xpress SARS-CoV-2/FLU/RSV  testing. Fact Sheet for Patients: PinkCheek.be Fact Sheet for Healthcare Providers: GravelBags.it This test is not yet approved or cleared by the [place] FDA and  has been authorized for detection and/or diagnosis of SARS-CoV-2 by  FDA under an Emergency Use Authorization (EUA). This EUA will remain  in effect (meaning this test can be used) for the duration of the  Covid-19 declaration under Section 564(b)(1) of the Act, 21  U.S.C. section 360bbb-3(b)(1), unless the authorization is  terminated or revoked. Performed at [facility], [address] [place], [place] [address]   MRSA PCR Screening     Status: None   Collection Time: 06/27/19  3:52 AM   Specimen: Nasal Mucosa; Nasopharyngeal  Result Value Ref Range Status   MRSA by PCR NEGATIVE NEGATIVE Final    Comment:        The GeneXpert MRSA Assay (FDA approved for NASAL specimens only), is one component of a comprehensive MRSA colonization surveillance program. It is not intended to diagnose MRSA infection nor to guide or monitor treatment for MRSA infections. Performed at [facility], [place] [address] [place], [place] [address]       Studies: No results found.  Scheduled Meds: . docusate  100 mg Oral BID   . feeding supplement (ENSURE ENLIVE)  237 mL Oral TID BM  . feeding supplement (PRO-STAT SUGAR FREE 64)  30 mL Oral BID  . [START ON 07/07/2019] fluconazole  100 mg Oral Daily  . fluconazole  200 mg Oral Once  . heparin injection (subcutaneous)  5,000 Units Subcutaneous Q8H  . levETIRAcetam  1,000 mg Oral BID  . multivitamin with minerals  1 tablet Oral Daily  . nystatin  5 mL Oral QID    Continuous Infusions:    LOS: 9 days     Kayleen Memos, MD [facility] Pager [phone]  If 7PM-7AM, please contact night-coverage [email or web address] Password [facility] 07/06/2019, 12:05 PM

## 2019-07-06 NOTE — Progress Notes (Signed)
Occupational Therapy Treatment Patient Details Name: Sara Schroeder MRN: [id number] DOB: 12-27-1950 Today's Date: 07/06/2019    History of present illness 69 year old female with a past medical history significant for hypertension and depression.  The patient presented to the emergency room with new onset of seizure.  CT of the head on admission showed a left frontal lobe hemorrhagic mass with surrounding vasogenic edema and a 2 mm of rightward midline shift. CT chest also demonstrating right middle pulmonary nodule. Pt underwent L frontal craniotomy and tumor resection on 5/6, preliminary pathology showing metastatic adenocarcinoma.   OT comments  Patient continues to make steady progress towards goals in skilled OT session. Patient's session encompassed co-treat with PT in order to further progress with functional mobility as well as increased independence in ADLs. Pt continues to demonstrate deficits in vision, with taped glasses helping, however with increased ambulation and completion of ADLs pt will continue to occlude R eye, and at times occlude L whilst ambulating. Despite use of translator, pt unable to verbalize deficits other than stating that the dizziness is improving. Pt demonstrating need for increased assistance to sequence basic tasks, however with cuing with items pt could sequence appropriately 50% of the time. Discharge plans remain appropriate; will continue to follow acutely.    Follow Up Recommendations  CIR;Supervision/Assistance - 24 hour    Equipment Recommendations  None recommended by OT    Recommendations for Other Services      Precautions / Restrictions Precautions Precautions: Fall Precaution Comments: impaired vision Restrictions Weight Bearing Restrictions: No       Mobility Bed Mobility Overal bed mobility: Needs Assistance Bed Mobility: Supine to Sit     Supine to sit: Min guard;HOB elevated     General bed mobility comments: pt able to long sit in  bed; cues for sequencing to get to EOB  Transfers Overall transfer level: Needs assistance   Transfers: Sit to/from Stand Sit to Stand: Min assist         General transfer comment: pt stood X 2 from EOB with min A to power up and for balance upon standing (2 person hand held assist use to progress mobility)    Balance Overall balance assessment: Needs assistance Sitting-balance support: No upper extremity supported;Feet unsupported Sitting balance-Leahy Scale: Fair     Standing balance support: Single extremity supported Standing balance-Leahy Scale: Poor Standing balance comment: able to static stand without UE support; without 2 person HHA pt would grip onto objects in environment and require increased encouragement to ambulate                           ADL either performed or assessed with clinical judgement   ADL Overall ADL's : Needs assistance/impaired     Grooming: Oral care;Maximal assistance;Standing Grooming Details (indicate cue type and reason): Pt able to identiy toothbrush, but unable to sequence toothpaste or wetting of toothbrush without cues to complete in appropriate order             Lower Body Dressing: Sitting/lateral leans;Minimal assistance;Min guard Lower Body Dressing Details (indicate cue type and reason): Donning socks, increased cues to adjust sock on R foot, increased prompting to initiate task, socks had to be placed in pt's hands to initiate Toilet Transfer: Moderate assistance;+2 for safety/equipment;Stand-pivot;Minimal assistance Toilet Transfer Details (indicate cue type and reason): HHA with PT simulated to recliner         Functional mobility during ADLs: +2 for physical assistance;+2  for safety/equipment;Moderate assistance General ADL Comments: HHA of 2 to walk, would not ambulate without 2 person assist, (would grip onto objects) decreased reports of dizziness, however continued deficits in vision which is difficult to  determine due to language deficit     Vision   Vision Assessment?: Vision impaired- to be further tested in functional context Additional Comments: Tape on glasses is aiding in dizziness per report, however with continued ambulation pt would close her R eye intermittently and was noted to close her L while ambulating, when prompted, pt was unable to answer appropriately   Perception     Praxis      Cognition Arousal/Alertness: Awake/alert Behavior During Therapy: Flat affect Overall Cognitive Status: Impaired/Different from baseline Area of Impairment: Attention;Following commands;Problem solving;Safety/judgement;Awareness;Memory                   Current Attention Level: Sustained Memory: Decreased recall of precautions;Decreased short-term memory Following Commands: Follows one step commands with increased time;Follows one step commands inconsistently Safety/Judgement: Decreased awareness of safety;Decreased awareness of deficits Awareness: Intellectual Problem Solving: Slow processing;Decreased initiation;Difficulty sequencing;Requires verbal cues;Requires tactile cues General Comments: pt with slow processing and at times not responding to commands or questions; pt able to complete mobility tasks when initiated by therapists        Exercises     Shoulder Instructions       General Comments      Pertinent Vitals/ Pain       Pain Assessment: Faces Faces Pain Scale: No hurt  Home Living                                          Prior Functioning/Environment              Frequency  Min 2X/week        Progress Toward Goals  OT Goals(current goals can now be found in the care plan section)  Progress towards OT goals: Progressing toward goals  Acute Rehab OT Goals Patient Stated Goal: Did not state  OT Goal Formulation: With patient/family Time For Goal Achievement: 07/17/19 Potential to Achieve Goals: Good  Plan Discharge plan  remains appropriate    Co-evaluation                 AM-PAC OT "6 Clicks" Daily Activity     Outcome Measure   Help from another person eating meals?: A Little Help from another person taking care of personal grooming?: A Lot Help from another person toileting, which includes using toliet, bedpan, or urinal?: A Lot Help from another person bathing (including washing, rinsing, drying)?: A Lot Help from another person to put on and taking off regular upper body clothing?: A Lot Help from another person to put on and taking off regular lower body clothing?: A Lot 6 Click Score: 13    End of Session Equipment Utilized During Treatment: Gait belt;Other (comment)(2 person hand held assist)  OT Visit Diagnosis: Unsteadiness on feet (R26.81);Cognitive communication deficit (R41.841);Low vision, both eyes (H54.2);Muscle weakness (generalized) (M62.81) Symptoms and signs involving cognitive functions: Other cerebrovascular disease   Activity Tolerance Patient limited by lethargy;Patient limited by fatigue   Patient Left in chair;with call bell/phone within reach;with chair alarm set;with family/visitor present   Nurse Communication Mobility status        Time: 0947-0962 OT Time Calculation (min): 33 min  Charges: OT General Charges $  OT Visit: 1 Visit OT Treatments $Self Care/Home Management : 8-22 mins  Corinne Ports E. Eryck Negron, COTA/L Acute Rehabilitation Services [phone] [place] 07/06/2019, 3:03 PM

## 2019-07-06 NOTE — Progress Notes (Signed)
Physical Therapy Treatment Patient Details Name: Sara Schroeder MRN: [id number] DOB: 07-27-1950 Today's Date: 07/06/2019    History of Present Illness 69 year old female with a past medical history significant for hypertension and depression.  The patient presented to the emergency room with new onset of seizure.  CT of the head on admission showed a left frontal lobe hemorrhagic mass with surrounding vasogenic edema and a 2 mm of rightward midline shift. CT chest also demonstrating right middle pulmonary nodule. Pt underwent L frontal craniotomy and tumor resection on 5/6, preliminary pathology showing metastatic adenocarcinoma.    PT Comments    Patient tolerated increased gait distance this session with min A +2 HHA. Pt with c/o mild dizziness with mobility potentially due to visual impairments vs BP. Pt presents with impaired mobility, cognition, and vision increasing risk for falls. Continue to recommend CIR level therapies to maximize independence and safety with mobility.    Follow Up Recommendations  CIR;Supervision/Assistance - 24 hour     Equipment Recommendations  Other (comment)(TBD if no post-acute therapy)    Recommendations for Other Services Rehab consult     Precautions / Restrictions Precautions Precautions: Fall Precaution Comments: impaired vision Restrictions Weight Bearing Restrictions: No    Mobility  Bed Mobility Overal bed mobility: Needs Assistance Bed Mobility: Supine to Sit     Supine to sit: Min guard;HOB elevated     General bed mobility comments: pt able to long sit in bed; cues for sequencing to get to EOB  Transfers Overall transfer level: Needs assistance   Transfers: Sit to/from Stand Sit to Stand: Min assist         General transfer comment: pt stood X 2 from EOB with min A to power up and for balance upon standing  Ambulation/Gait Ambulation/Gait assistance: Min assist;+2 physical assistance;+2 safety/equipment Gait Distance (Feet):  (~60 ft) Assistive device: 2 person hand held assist Gait Pattern/deviations: Step-through pattern;Decreased stride length;Shuffle;Trunk flexed;Narrow base of support Gait velocity: very slow   General Gait Details: assist for balance; cues for increased bilat step lengths; no change in gait noted with cues   Stairs             Wheelchair Mobility    Modified Rankin (Stroke Patients Only)       Balance Overall balance assessment: Needs assistance Sitting-balance support: No upper extremity supported;Feet unsupported Sitting balance-Leahy Scale: Fair     Standing balance support: Single extremity supported Standing balance-Leahy Scale: Poor Standing balance comment: able to static stand without UE support                             Cognition Arousal/Alertness: Awake/alert Behavior During Therapy: Flat affect Overall Cognitive Status: Impaired/Different from baseline Area of Impairment: Attention;Following commands;Problem solving                   Current Attention Level: Sustained   Following Commands: Follows one step commands with increased time;Follows one step commands inconsistently     Problem Solving: Slow processing;Decreased initiation;Difficulty sequencing;Requires verbal cues;Requires tactile cues General Comments: pt with slow processing and at times not responding to commands or questions; pt able to complete mobility tasks when initiated by therapists      Exercises      General Comments        Pertinent Vitals/Pain Pain Assessment: Faces Faces Pain Scale: No hurt    Home Living  Prior Function            PT Goals (current goals can now be found in the care plan section) Acute Rehab PT Goals Patient Stated Goal: Did not state  Progress towards PT goals: Progressing toward goals    Frequency    Min 4X/week      PT Plan Current plan remains appropriate    Co-evaluation               AM-PAC PT "6 Clicks" Mobility   Outcome Measure  Help needed turning from your back to your side while in a flat bed without using bedrails?: None Help needed moving from lying on your back to sitting on the side of a flat bed without using bedrails?: None Help needed moving to and from a bed to a chair (including a wheelchair)?: A Little Help needed standing up from a chair using your arms (e.g., wheelchair or bedside chair)?: A Little Help needed to walk in hospital room?: A Little Help needed climbing 3-5 steps with a railing? : A Little 6 Click Score: 20    End of Session Equipment Utilized During Treatment: Gait belt Activity Tolerance: Patient tolerated treatment well Patient left: in chair;with call bell/phone within reach;with chair alarm set;with family/visitor present Nurse Communication: Mobility status PT Visit Diagnosis: Unsteadiness on feet (R26.81);Other abnormalities of gait and mobility (R26.89);Muscle weakness (generalized) (M62.81);Apraxia (R48.2);Other symptoms and signs involving the nervous system (R29.898)     Time: 2800-3491 PT Time Calculation (min) (ACUTE ONLY): 33 min  Charges:  $Gait Training: 8-22 mins                     Earney Navy, PTA Acute Rehabilitation Services Pager: [phone] Office: [phone]     Darliss Cheney 07/06/2019, 2:53 PM

## 2019-07-06 NOTE — Progress Notes (Signed)
Neurosurgery Service Progress Note  Subjective: NAE ON, per family stable verbal function overnight, more output than immediately post-op but not yet back to baseline  Objective: Vitals:   07/05/19 2014 07/05/19 2347 07/06/19 0400 07/06/19 0726  BP: 118/67 121/73 111/70 116/72  Pulse: (!) 105 (!) 103 100 100  Resp: 17 17 16 16   Temp: (!) 100.4 F (38 C) 97.8 F (36.6 C) 99.5 F (37.5 C) 98.4 F (36.9 C)  TempSrc: Oral Oral Oral Oral  SpO2: 97% 94% 95% 97%  Weight:      Height:       Temp (24hrs), Avg:98.9 F (37.2 C), Min:97.8 F (36.6 C), Max:100.4 F (38 C)  CBC Latest Ref Rng & Units 07/06/2019 07/04/2019 07/03/2019  WBC 4.0 - 10.5 K/uL 12.7(H) 13.1(H) 14.0(H)  Hemoglobin 12.0 - 15.0 g/dL 12.2 10.7(L) 10.7(L)  Hematocrit 36.0 - 46.0 % 37.9 32.5(L) 32.5(L)  Platelets 150 - 400 K/uL 310 249 221   BMP Latest Ref Rng & Units 07/06/2019 07/03/2019 07/02/2019  Glucose 70 - 99 mg/dL 123(H) 165(H) 147(H)  BUN 8 - 23 mg/dL 20 25(H) 21  Creatinine 0.44 - 1.00 mg/dL 0.71 0.65 0.70  Sodium 135 - 145 mmol/L 139 139 137  Potassium 3.5 - 5.1 mmol/L 4.1 3.9 3.8  Chloride 98 - 111 mmol/L 100 104 101  CO2 22 - 32 mmol/L 30 27 25   Calcium 8.9 - 10.3 mg/dL 9.2 8.9 9.2    Intake/Output Summary (Last 24 hours) at 07/06/2019 0854 Last data filed at 07/05/2019 2345 Gross per 24 hour  Intake --  Output 600 ml  Net -600 ml    Current Facility-Administered Medications:  .  acetaminophen (TYLENOL) tablet 650 mg, 650 mg, Oral, Q4H PRN, Judith Part, MD, 650 mg at 07/05/19 2120 .  docusate (COLACE) 50 MG/5ML liquid 100 mg, 100 mg, Oral, BID, Erick Colace, NP, 100 mg at 07/05/19 2120 .  feeding supplement (ENSURE ENLIVE) (ENSURE ENLIVE) liquid 237 mL, 237 mL, Oral, TID BM, Hall, Carole N, DO, 237 mL at 07/05/19 2145 .  feeding supplement (PRO-STAT SUGAR FREE 64) liquid 30 mL, 30 mL, Oral, BID, Hall, Carole N, DO, 30 mL at 07/05/19 2120 .  heparin injection 5,000 Units, 5,000 Units,  Subcutaneous, Q8H, Judith Part, MD, 5,000 Units at 07/06/19 0551 .  levETIRAcetam (KEPPRA) 100 MG/ML solution 1,000 mg, 1,000 mg, Oral, BID, Irene Pap N, DO, 1,000 mg at 07/05/19 2120 .  LORazepam (ATIVAN) injection 2 mg, 2 mg, Intravenous, Q5 min PRN, Erick Colace, NP, 2 mg at 07/02/19 2356 .  magic mouthwash w/lidocaine, 5 mL, Oral, TID PRN, Irene Pap N, DO .  menthol-cetylpyridinium (CEPACOL) lozenge 3 mg, 1 lozenge, Oral, PRN, Judith Part, MD, 3 mg at 07/04/19 1843 .  multivitamin with minerals tablet 1 tablet, 1 tablet, Oral, Daily, Irene Pap N, DO, 1 tablet at 07/05/19 1033 .  nystatin (MYCOSTATIN) 100000 UNIT/ML suspension 500,000 Units, 5 mL, Oral, QID, Kayleen Memos, DO, 500,000 Units at 07/05/19 2120 .  ondansetron (ZOFRAN) injection 4 mg, 4 mg, Intravenous, Q6H PRN, Salvadore Dom E, NP .  polyethylene glycol (MIRALAX / GLYCOLAX) packet 17 g, 17 g, Per Tube, Daily PRN, Erick Colace, NP, 17 g at 07/04/19 3710   Physical Exam: Awake/alert, occasionally speaking a few words at a time to family, comprehension intact, R side 5/5, L 5/5 Incision c/d/i  Assessment & Plan: 69 y.o. woman p/w seizures, MRI brain with left frontal 23x72mm hemorrhagic mass,  CT CAP with pulmonary nodule with some growth since 2020 CT. 5/6 s/p crani for rsxn, 5/7 post-op MRI GTR  -continue keppra, off dex -will discuss at tumor board, but if she has one lesion that was a gross total resection and it's NSCLC, I do not think she will require any further treatment to her resection cavity, just q41mo MRIs  Judith Part  07/06/19 8:54 AM

## 2019-07-06 NOTE — Consult Note (Signed)
Radiation Oncology         [phone] ________________________________  Name: Sara Schroeder        MRN: [id number]  Date of Service: 07/06/19  DOB: [birth year]  [id number], [facility]   REFERRING PHYSICIAN: Dr. Zada Finders  DIAGNOSIS: Diagnoses of Acute hypoxemic respiratory failure ([place]), Thyroid nodule greater than or equal to 1.5 cm in diameter incidentally noted on imaging study, and Rales were pertinent to this visit.   HISTORY OF PRESENT ILLNESS: Sara Schroeder is a 68 y.o. female seen at the request of Dr. Zada Finders for a newly diagnosed metastatic lung cancer. The patient presented on 06/26/2019 to the emergency department after a witnessed seizure of the right extremities at home.  In the emergency department she had approximately 2-3 additional seizures.  CT scan imaging revealed a frontal mass in the left hemisphere measuring up to 2.8 cm.  Subsequent MRI with and without contrast on 06/27/19 revealed a solitary 2.6 cm mass in the left frontal lobe with extensive vasogenic edema.  CT chest abdomen pelvis that day revealed an 11 x 11 x 10 mm right middle lobe nodule as well as a 63mm thyroid nodule without evidence of other distant disease but this was limited by the lack of contrast.  She subsequently underwent craniotomy with Dr. Venetia Constable on 06/29/2019.  Follow-up MRI scans revealed what appeared to be a gross total resection, she did have some persistent edema and mild mass-effect that was similar to her prior scans, her final pathology revealed a metastatic adenocarcinoma consistent with lung primary.  She has been accepted in the rehab program at [facility] and the anticipated time for her rehabilitation will be about a week.  I contacted her son today to initially introduce our department so that we can coordinate outpatient follow-up though our conversation continued to develop into more of a formal consultation.    PREVIOUS RADIATION THERAPY: No   PAST MEDICAL HISTORY:  Past Medical  History:  Diagnosis Date  . Anxiety   . Hypertension        PAST SURGICAL HISTORY: Past Surgical History:  Procedure Laterality Date  . APPLICATION OF CRANIAL NAVIGATION N/A 06/29/2019   Procedure: APPLICATION OF CRANIAL NAVIGATION;  Surgeon: Judith Part, MD;  Location: [facility];  Service: Neurosurgery;  Laterality: N/A;  . CRANIOTOMY Left 06/29/2019   Procedure: LEFT CRANIOTOMY FOR TUMOR EXCISION;  Surgeon: Judith Part, MD;  Location: [facility];  Service: Neurosurgery;  Laterality: Left;     FAMILY HISTORY: History reviewed. No pertinent family history.   SOCIAL HISTORY:  reports that she has never smoked. She has never used smokeless tobacco. She reports that she does not drink alcohol or use drugs.  The patient is married she lives in [place] which is in [place].  She has 3 sons, her son Annie Main is a interpreter as her native language is [place] Glad he has been most physically present at the hospital for discussions.  Her Sunday is also listed as her primary contact who is the son I spoke with.  ALLERGIES: Lisinopril   MEDICATIONS:  Current Facility-Administered Medications  Medication Dose Route Frequency Provider Last Rate Last Admin  . acetaminophen (TYLENOL) tablet 650 mg  650 mg Oral Q4H PRN Judith Part, MD   650 mg at 07/05/19 2120  . docusate (COLACE) 50 MG/5ML liquid 100 mg  100 mg Oral BID Erick Colace, NP   100 mg at 07/06/19 1014  . feeding  supplement (ENSURE ENLIVE) (ENSURE ENLIVE) liquid 237 mL  237 mL Oral TID BM Hall, Carole N, DO   237 mL at 07/06/19 1355  . feeding supplement (PRO-STAT SUGAR FREE 64) liquid 30 mL  30 mL Oral BID Irene Pap N, DO   30 mL at 07/06/19 1014  . [START ON 07/07/2019] fluconazole (DIFLUCAN) tablet 100 mg  100 mg Oral Daily Irene Pap N, DO      . heparin injection 5,000 Units  5,000 Units Subcutaneous Q8H Judith Part, MD   5,000 Units at 07/06/19 1355  . levETIRAcetam  (KEPPRA) 100 MG/ML solution 1,000 mg  1,000 mg Oral BID Irene Pap N, DO   1,000 mg at 07/06/19 1014  . LORazepam (ATIVAN) injection 2 mg  2 mg Intravenous Q5 min PRN Erick Colace, NP   2 mg at 07/02/19 2356  . magic mouthwash w/lidocaine  5 mL Oral TID PRN Kayleen Memos, DO      . menthol-cetylpyridinium (CEPACOL) lozenge 3 mg  1 lozenge Oral PRN Judith Part, MD   3 mg at 07/04/19 1843  . multivitamin with minerals tablet 1 tablet  1 tablet Oral Daily Irene Pap N, DO   1 tablet at 07/06/19 1014  . nystatin (MYCOSTATIN) 100000 UNIT/ML suspension 500,000 Units  5 mL Oral QID Irene Pap N, DO   500,000 Units at 07/06/19 1355  . ondansetron (ZOFRAN) injection 4 mg  4 mg Intravenous Q6H PRN Erick Colace, NP      . polyethylene glycol (MIRALAX / GLYCOLAX) packet 17 g  17 g Per Tube Daily PRN Erick Colace, NP   17 g at 07/04/19 4403     REVIEW OF SYSTEMS: On review of systems, the patient's son states his mother is doing well with her recovery and her speech is improved. No other complaints are noted.    PHYSICAL EXAM:  Wt Readings from Last 3 Encounters:  07/03/19 135 lb 12.9 oz (61.6 kg)  06/26/19 94 lb 12.8 oz (43 kg)  09/29/17 95 lb (43.1 kg)   Temp Readings from Last 3 Encounters:  07/06/19 98.7 F (37.1 C) (Oral)  06/27/19 99.8 F (37.7 C)  03/01/18 98.7 F (37.1 C) (Oral)   BP Readings from Last 3 Encounters:  07/06/19 112/69  06/27/19 111/62  03/01/18 (!) 162/82   Pulse Readings from Last 3 Encounters:  07/06/19 97  06/27/19 83  03/01/18 100   Pain Assessment Pain Score: 0-No pain/10    ECOG = 2  0 - Asymptomatic (Fully active, able to carry on all predisease activities without restriction)  1 - Symptomatic but completely ambulatory (Restricted in physically strenuous activity but ambulatory and able to carry out work of a light or sedentary nature. For example, light housework, office work)  2 - Symptomatic, <50% in bed during the day  (Ambulatory and capable of all self care but unable to carry out any work activities. Up and about more than 50% of waking hours)  3 - Symptomatic, >50% in bed, but not bedbound (Capable of only limited self-care, confined to bed or chair 50% or more of waking hours)  4 - Bedbound (Completely disabled. Cannot carry on any self-care. Totally confined to bed or chair)  5 - Death   Eustace Pen MM, Creech RH, Tormey DC, et al. [phone]). "Toxicity and response criteria of the [facility] Group". [place] Oncol. 5 (6): 649-55    LABORATORY DATA:  Lab Results  Component Value  Date   WBC 12.7 (H) 07/06/2019   HGB 12.2 07/06/2019   HCT 37.9 07/06/2019   MCV 95.0 07/06/2019   PLT 310 07/06/2019   Lab Results  Component Value Date   NA 139 07/06/2019   K 4.1 07/06/2019   CL 100 07/06/2019   CO2 30 07/06/2019   Lab Results  Component Value Date   ALT 17 06/27/2019   AST 26 06/27/2019   ALKPHOS 60 06/27/2019   BILITOT 1.2 06/27/2019      RADIOGRAPHY: EEG  Result Date: 06/27/2019 Lora Havens, MD     06/27/2019  9:56 AM Patient Name: Paradise Vensel MRN: [id number] Epilepsy Attending: Lora Havens Referring Physician/Provider: Eliseo Gum, NP Date: 06/27/2019 Duration: 28.21 minutes Patient history: 69 year old female presented with seizure-like episode and was found to have left frontal lobe hemorrhagic mass.  EEG evaluate for seizures. Level of alertness: Comatose AEDs during EEG study: Keppra, propofol Technical aspects: This EEG study was done with scalp electrodes positioned according to the 10-20 International system of electrode placement. Electrical activity was acquired at a sampling rate of 500Hz  and reviewed with a high frequency filter of 70Hz  and a low frequency filter of 1Hz . EEG data were recorded continuously and digitally stored. Description: EEG showed an excessive amount of 15 to 18 Hz, sharply contoured beta activity with irregular morphology distributed  symmetrically and diffusely.   Intermittent generalized background attenuation was also noted. Hyperventilation and photic stimulation were not performed. Abnormality -Excessive beta, generalized -Background attenuation, generalized IMPRESSION: This study is suggestive of severe to profound diffuse encephalopathy, nonspecific etiology but most likely secondary to sedation. No seizures or definite epileptiform discharges were seen throughout the recording. Priyanka [name] [name]   CT ABDOMEN PELVIS WO CONTRAST  Result Date: 06/27/2019 CLINICAL DATA:  Brain mass. Lung nodule. EXAM: CT CHEST, ABDOMEN AND PELVIS WITHOUT CONTRAST TECHNIQUE: Multidetector CT imaging of the chest, abdomen and pelvis was performed following the standard protocol without IV contrast. COMPARISON:  Chest radiograph yesterday. CT chest abdomen pelvis 03/01/2018 FINDINGS: CT CHEST FINDINGS Cardiovascular: Aortic atherosclerosis. No aortic aneurysm. Heart is normal in size. No pericardial effusion. Mediastinum/Nodes: Accurate assessment for hilar adenopathy is limited in the absence of IV contrast. There are no enlarged mediastinal lymph nodes. Endotracheal tube with tip above the carina. Enteric tube decompresses the esophagus. There is a 16 mm nodule that abuts the isthmus of the thyroid, likely unchanged from prior, on the previous exam portions of this region obscured by streak artifact from IV contrast. There is no axillary adenopathy. Lungs/Pleura: Spiculated right middle lobe pulmonary nodule measures 11 x 11 x 10 mm, only minimally increased in size from prior exam. Margins are irregular and spiculated with small spiculation extending to the pleural surface anteriorly, minimal pleural thickening. No other pulmonary nodule or mass. Dependent opacities in the lung bases favor atelectasis. Trace pleural thickening without significant effusion. Musculoskeletal: No blastic or evidence of destructive lytic lesion. Ordinary degenerative change in  the spine. No obvious breast mass. CT ABDOMEN PELVIS FINDINGS Hepatobiliary: Lack of IV contrast limits assessment for focal liver lesion. Capsular calcification involving the dome. No evidence of focal hepatic lesion. Gallbladder physiologically distended, no calcified stone. No biliary dilatation. Pancreas: No ductal dilatation or inflammation. No evidence of focal lesion on this noncontrast exam. Spleen: Normal in size without focal abnormality. Adrenals/Urinary Tract: Slight adrenal thickening without renal nodule. No hydronephrosis. Mild prominence of both renal collecting systems. No evidence of focal renal lesion on noncontrast exam. Urinary bladder  is decompressed by Foley catheter. Stomach/Bowel: Enteric tube tip in the stomach. Contrast and fluid in the stomach without evidence of focal gastric mass. No small bowel wall thickening or inflammation. No obstruction, administered enteric contrast reaches the colon. Normal contrast filled appendix. Moderate stool distally. Vascular/Lymphatic: Aortic atherosclerosis. No aortic aneurysm. No enlarged lymph nodes in the abdomen or pelvis, detailed assessment limited in the absence of IV contrast. Small calcified lymph node adjacent to the proximal sigmoid colon, suggesting prior granulomatous disease. Reproductive: Uterus and bilateral adnexa are unremarkable. No evidence of adnexal mass. Other: No ascites or free fluid. No omental thickening. Musculoskeletal: No blastic or destructive lytic lesions. Transitional lumbosacral anatomy. Degenerative disc disease at L4-L5. There are no acute or suspicious osseous abnormalities. Calcified granuloma in the subcutaneous soft tissues. No suspicious skin or subcutaneous lesion. IMPRESSION: 1. Spiculated right middle lobe pulmonary nodule measuring 11 x 11 x 10 mm, only minimally increased in size from prior exam. This is suspicious for primary bronchogenic malignancy. Evaluation of the hila is limited in the absence of IV  contrast, allowing for this limitation, no evidence of nodal metastasis. 2. No other evidence of metastatic disease or primary malignancy in the chest, abdomen, or pelvis. 3. Exophytic 16 mm nodule from the thyroid isthmus, patient had previous thyroid uptake and scan at an outside institution that reported this nodule, however size was not specified, and those images are not available. No thyroid ultrasounds are available for review. Therefore, recommend thyroid US (ref: J Am Coll Radiol. 2015 Feb;12(2): 143-50), unless performed elsewhere. 4. Dependent opacities in the lung bases favor atelectasis. Aortic Atherosclerosis (ICD10-I70.0). Electronically Signed   By: Keith Rake M.D.   On: 06/27/2019 16:56   CT Head Wo Contrast  Result Date: 06/26/2019 CLINICAL DATA:  Seizure EXAM: CT HEAD WITHOUT CONTRAST TECHNIQUE: Contiguous axial images were obtained from the base of the skull through the vertex without intravenous contrast. COMPARISON:  None. FINDINGS: Brain: There is hemorrhage in the left frontal lobe measuring 2.7 x 2.0 x 2.8 cm with a large amount surrounding vasogenic edema. This is likely a mass. Brain parenchyma is otherwise normal. There is no herniation. 2 mm of rightward midline shift. Vascular: Negative Skull: Normal Sinuses/Orbits: Clear sinuses. Normal orbits. Other: None IMPRESSION: Left frontal lobe hemorrhagic mass with surrounding vasogenic edema and 2 mm of rightward midline shift. MRI with and without contrast is recommended for further characterization. Critical Value/emergent results were called by telephone at the time of interpretation on 06/26/2019 at 8:39 pm to provider [facility] , who verbally acknowledged these results. Electronically Signed   By: Ulyses Jarred M.D.   On: 06/26/2019 20:39   CT CHEST WO CONTRAST  Result Date: 06/27/2019 CLINICAL DATA:  Brain mass. Lung nodule. EXAM: CT CHEST, ABDOMEN AND PELVIS WITHOUT CONTRAST TECHNIQUE: Multidetector CT imaging of the  chest, abdomen and pelvis was performed following the standard protocol without IV contrast. COMPARISON:  Chest radiograph yesterday. CT chest abdomen pelvis 03/01/2018 FINDINGS: CT CHEST FINDINGS Cardiovascular: Aortic atherosclerosis. No aortic aneurysm. Heart is normal in size. No pericardial effusion. Mediastinum/Nodes: Accurate assessment for hilar adenopathy is limited in the absence of IV contrast. There are no enlarged mediastinal lymph nodes. Endotracheal tube with tip above the carina. Enteric tube decompresses the esophagus. There is a 16 mm nodule that abuts the isthmus of the thyroid, likely unchanged from prior, on the previous exam portions of this region obscured by streak artifact from IV contrast. There is no axillary adenopathy. Lungs/Pleura: Spiculated  right middle lobe pulmonary nodule measures 11 x 11 x 10 mm, only minimally increased in size from prior exam. Margins are irregular and spiculated with small spiculation extending to the pleural surface anteriorly, minimal pleural thickening. No other pulmonary nodule or mass. Dependent opacities in the lung bases favor atelectasis. Trace pleural thickening without significant effusion. Musculoskeletal: No blastic or evidence of destructive lytic lesion. Ordinary degenerative change in the spine. No obvious breast mass. CT ABDOMEN PELVIS FINDINGS Hepatobiliary: Lack of IV contrast limits assessment for focal liver lesion. Capsular calcification involving the dome. No evidence of focal hepatic lesion. Gallbladder physiologically distended, no calcified stone. No biliary dilatation. Pancreas: No ductal dilatation or inflammation. No evidence of focal lesion on this noncontrast exam. Spleen: Normal in size without focal abnormality. Adrenals/Urinary Tract: Slight adrenal thickening without renal nodule. No hydronephrosis. Mild prominence of both renal collecting systems. No evidence of focal renal lesion on noncontrast exam. Urinary bladder is  decompressed by Foley catheter. Stomach/Bowel: Enteric tube tip in the stomach. Contrast and fluid in the stomach without evidence of focal gastric mass. No small bowel wall thickening or inflammation. No obstruction, administered enteric contrast reaches the colon. Normal contrast filled appendix. Moderate stool distally. Vascular/Lymphatic: Aortic atherosclerosis. No aortic aneurysm. No enlarged lymph nodes in the abdomen or pelvis, detailed assessment limited in the absence of IV contrast. Small calcified lymph node adjacent to the proximal sigmoid colon, suggesting prior granulomatous disease. Reproductive: Uterus and bilateral adnexa are unremarkable. No evidence of adnexal mass. Other: No ascites or free fluid. No omental thickening. Musculoskeletal: No blastic or destructive lytic lesions. Transitional lumbosacral anatomy. Degenerative disc disease at L4-L5. There are no acute or suspicious osseous abnormalities. Calcified granuloma in the subcutaneous soft tissues. No suspicious skin or subcutaneous lesion. IMPRESSION: 1. Spiculated right middle lobe pulmonary nodule measuring 11 x 11 x 10 mm, only minimally increased in size from prior exam. This is suspicious for primary bronchogenic malignancy. Evaluation of the hila is limited in the absence of IV contrast, allowing for this limitation, no evidence of nodal metastasis. 2. No other evidence of metastatic disease or primary malignancy in the chest, abdomen, or pelvis. 3. Exophytic 16 mm nodule from the thyroid isthmus, patient had previous thyroid uptake and scan at an outside institution that reported this nodule, however size was not specified, and those images are not available. No thyroid ultrasounds are available for review. Therefore, recommend thyroid US (ref: J Am Coll Radiol. 2015 Feb;12(2): 143-50), unless performed elsewhere. 4. Dependent opacities in the lung bases favor atelectasis. Aortic Atherosclerosis (ICD10-I70.0). Electronically Signed    By: Keith Rake M.D.   On: 06/27/2019 16:56   MR BRAIN W WO CONTRAST  Result Date: 06/30/2019 CLINICAL DATA:  Post mass resection EXAM: MRI HEAD WITHOUT AND WITH CONTRAST TECHNIQUE: Multiplanar, multiecho pulse sequences of the brain and surrounding structures were obtained without and with intravenous contrast. CONTRAST:  98mL GADAVIST GADOBUTROL 1 MMOL/ML IV SOLN COMPARISON:  06/27/2019 FINDINGS: Brain: Post resection of left mass with thin extra-axial collection and resection cavity containing fluid and blood products. There is reduced diffusion at the deep margin likely reflecting postoperative contusion. There is no residual nodular enhancement. Associated edema and regional mass effect are similar. Vascular: Major vessel flow voids at the skull base are preserved. Skull and upper cervical spine: Post left frontal craniotomy. Normal marrow signal is preserved. Sinuses/Orbits: Paranasal sinuses are aerated. Orbits are unremarkable. Other: Sella is unremarkable.  Mastoid air cells are clear. IMPRESSION: Expected postoperative changes  of gross total resection of left frontal mass. Surrounding edema and mild mass effect are similar. Electronically Signed   By: Macy Mis M.D.   On: 06/30/2019 08:08   MR BRAIN W WO CONTRAST  Result Date: 06/27/2019 CLINICAL DATA:  Brain mass.  History of lung nodule. EXAM: MRI HEAD WITHOUT AND WITH CONTRAST TECHNIQUE: Multiplanar, multiecho pulse sequences of the brain and surrounding structures were obtained without and with intravenous contrast. CONTRAST:  5.22mL GADAVIST GADOBUTROL 1 MMOL/ML IV SOLN COMPARISON:  CT head 06/26/2019. MRI head without with contrast 06/27/2019 FINDINGS: Brain: SRS protocol with thin sections performed on 3 tesla MRI. Solitary mass left frontal lobe with evidence of internal hemorrhage. The mass measures 2.2 x 2.3 x 2.8 cm. There is extensive vasogenic edema in the white matter. This is causing mass-effect and mild midline shift to the  right of approximately 2 mm. Ventricle size normal. No second lesion identified. Mild chronic microvascular ischemic change in the white matter. Negative for acute infarct. Vascular: Normal arterial flow voids Skull and upper cervical spine: No focal skeletal lesion. Sinuses/Orbits: Mild mucosal edema paranasal sinuses. Bilateral orbits Other: None IMPRESSION: Solitary hemorrhagic mass left frontal lobe measuring 2.2 x 2.3 x 2.8 cm. Extensive vasogenic edema. 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 Primary brain tumor not likely. Electronically Signed   By: Franchot Gallo M.D.   On: 06/27/2019 17:43   MR BRAIN W WO CONTRAST  Result Date: 06/27/2019 CLINICAL DATA:  Mass follow-up EXAM: MRI HEAD WITHOUT AND WITH CONTRAST TECHNIQUE: Multiplanar, multiecho pulse sequences of the brain and surrounding structures were obtained without and with intravenous contrast. CONTRAST:  53mL GADAVIST GADOBUTROL 1 MMOL/ML IV SOLN COMPARISON:  Head CT from yesterday FINDINGS: Brain: 2.6 cm left lateral frontal mass which appears intra-axial. The mass is T2 hypointense and mildly dense by CT. There is patchy T1 shortening compatible with blood products based on gradient imaging. There is prominent adjacent vasogenic edema. The mass is diffusely enhancing and not a simple hematoma. No second mass is seen.  No infarct, hydrocephalus, or collection. Vascular: Normal flow voids and vascular enhancements Skull and upper cervical spine: Normal marrow signal Sinuses/Orbits: Negative IMPRESSION: 1. 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 There was a worrisome pulmonary nodule on a January 2020 chest CT, updated chest CT may be contributory. 2. Extensive vasogenic edema. Electronically Signed   By: Monte Fantasia M.D.   On: 06/27/2019 08:12   DG CHEST PORT 1 VIEW  Result Date: 07/05/2019 CLINICAL DATA:  Rales. Additional provided: Patient recently presented with seizure was found to have  a large hemorrhagic left frontal lobe mass. EXAM: PORTABLE CHEST 1 VIEW COMPARISON:  Chest radiograph 06/28/2019, chest CT 06/27/2019 FINDINGS: Heart size within normal limits. Mild ill-defined opacity at the right lung base. Redemonstrated right middle lobe lung nodule better characterized on chest CT 06/27/2019. The left lung is clear. No evidence of pleural effusion or pneumothorax. No acute bony abnormality identified. IMPRESSION: Mild ill-defined opacity at the right lung base which may reflect atelectasis or pneumonia. Redemonstrated right middle lobe pulmonary nodule better characterized on prior chest CT 06/27/2019. Electronically Signed   By: Kellie Simmering DO   On: 07/05/2019 11:06   DG CHEST PORT 1 VIEW  Result Date: 06/28/2019 CLINICAL DATA:  Acute hypoxemic respiratory failure EXAM: PORTABLE CHEST 1 VIEW COMPARISON:  CT 06/27/2019 FINDINGS: *Endotracheal tube in the mid trachea 3 cm from the carina. *Transesophageal tube tip and side port  distal to the GE junction curling in the left upper quadrant. *Telemetry leads and support devices overlie the chest. Redemonstration of a right middle lobe pulmonary nodule better seen on comparison CT. Mild basilar atelectatic changes. No acute consolidative opacity or convincing features of edema. No pneumothorax or effusion. Cardiomediastinal contours are stable. No acute osseous or soft tissue abnormality. Degenerative changes are present in the imaged spine and shoulders. IMPRESSION: 1. Mild basilar atelectatic changes. 2. Redemonstration of right middle lobe pulmonary nodule better seen on comparison CT. 3. Lines and tubes as above. Electronically Signed   By: Lovena Le M.D.   On: 06/28/2019 06:27   DG Chest Port 1 View  Result Date: 06/27/2019 CLINICAL DATA:  Intubation. EXAM: PORTABLE CHEST 1 VIEW COMPARISON:  Chest x-ray 06/26/2019, 03/01/2018. FINDINGS: Endotracheal tube, NG tube in good anatomic position. NG tube has been advanced its tip and side  hole in the stomach. Heart size normal. As previously noted on prior study of 06/26/2018 right lower lobe nodule appears larger. Again further evaluation with chest CT is suggested. Mild left base subsegmental atelectasis. No pleural effusion or pneumothorax. IMPRESSION: 1. Lines and tubes in good anatomic position. NG tube has been advanced its tip and side hole are within the stomach. 2. As noted on prior study of 06/26/2018 right lower lobe pulmonary nodule appears larger. Again further evaluation with chest CT suggested. These results will be called to the ordering clinician or representative by the Radiologist Assistant, and communication documented in the PACS or [employer]. Electronically Signed   By: Marcello Moores  Register   On: 06/27/2019 08:42   DG Chest Portable 1 View  Result Date: 06/26/2019 CLINICAL DATA:  69 year old female status post intubation. EXAM: PORTABLE CHEST 1 VIEW COMPARISON:  Earlier radiograph dated 06/26/2019. FINDINGS: Endotracheal tube with tip approximately 18 mm above the carina. Recommend retraction by 2-3 cm for optimal positioning. Enteric tube with side port above the GE junction. Recommend further advancing by additional 8 cm. No other interval change. IMPRESSION: 1. Endotracheal tube above the carina. 2. Enteric tube with side port above the GE junction. Recommend further advancing by additional 8 cm. Electronically Signed   By: Anner Crete M.D.   On: 06/26/2019 21:27   DG Chest Portable 1 View  Result Date: 06/26/2019 CLINICAL DATA:  Fever EXAM: PORTABLE CHEST 1 VIEW COMPARISON:  03/01/2018 FINDINGS: The heart size and mediastinal contours are within normal limits. Right lower lung nodule may be slightly increased, now measuring 14 mm. The visualized skeletal structures are unremarkable. IMPRESSION: No active disease. Suspected slight increase in size of right lower lung nodule. Follow-up chest CT is recommended. Electronically Signed   By: Donavan Foil M.D.   On:  06/26/2019 20:48   EEG adult  Result Date: 06/30/2019 Lora Havens, MD     07/01/2019  8:50 AM Patient Name: Aviance Cooperwood MRN: [id number] Epilepsy Attending: Lora Havens Referring Physician/Provider:  Dr. 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  EEG evaluate for seizures.  Level of alertness:  Awake  AEDs during EEG study: Keppra  Technical aspects: This EEG study was done with scalp electrodes positioned according to the 10-20 International system of electrode placement. Electrical activity was acquired at a sampling rate of 500Hz  and reviewed with a high frequency filter of 70Hz  and a low frequency filter of 1Hz . EEG data were recorded continuously and digitally  stored.  Description:  No clear posterior dominant rhythm was seen.  EEG showed continuous generalized polymorphic 5 to 9 Hz theta-alpha activity.  Hyperventilation and photic stimulation were not performed. Of note, EEG was technically difficult due to significant eye flutter artifact.  Abnormality -Continuous slow, generalized  IMPRESSION: This technically difficult study suggestive of moderate diffuse encephalopathy, nonspecific etiology. No seizures or definite epileptiform discharges were seen throughout the recording. Priyanka [name] [name]   Overnight EEG with video  Result Date: 07/01/2019 Lora Havens, MD     07/02/2019  9:21 AM Patient Name:Eugenie Dusza OYD:[id number] Epilepsy Attending:Priyanka [name] [name] Referring Physician/Provider: Dr. Emelda Brothers Duration:06/30/2019 [phone] to 07/01/2019 1223  Patient history:69 year old female presented with seizure-like episode and was found to have left frontal lobe hemorrhagic mass. EEG evaluate for seizures.  Level of alertness: awake, asleep  AEDs during EEG study:Keppra  Technical aspects: This EEG study was done with scalp electrodes positioned  according to the 10-20 International system of electrode placement. Electrical activity was acquired at a sampling rate of 500Hz  and reviewed with a high frequency filter of 70Hz  and a low frequency filter of 1Hz . EEG data were recorded continuously and digitally stored.  Description: No clear posterior dominant rhythm was seen. Sleep was characterized by vertex waves, sleep spindles (12-14hz ), maximal frontocentral region. EEG showed continuous generalized and lateralized left frontotemporal region  3-6 Hz theta-delta slowing.  Hyperventilation and photic stimulation were not performed.  Abnormality -Continuous slow, generalized and lateralized left frontotemporal region  IMPRESSION: This study suggestive of cortical dysfunction in left frontotemporal region consistent with underlying structural abnormality. Additionally, there is evidence of moderate diffuse encephalopathy, nonspecific etiology.No seizures ordefiniteepileptiform discharges were seen throughout the recording.  Lora Havens   US THYROID  Result Date: 06/28/2019 CLINICAL DATA:  Incidental on CT. Nodule of thyroid isthmus by CT of the chest. EXAM: THYROID ULTRASOUND TECHNIQUE: Ultrasound examination of the thyroid gland and adjacent soft tissues was performed. COMPARISON:  CT of the chest on 06/27/2019 FINDINGS: Parenchymal Echotexture: Mildly heterogenous Isthmus: 1.0 cm Right lobe: 4.4 x 1.7 x 1.2 cm Left lobe: 4.3 x 1.9 x 1.3 cm _________________________________________________________ Estimated total number of nodules >/= 1 cm: 1 Number of spongiform nodules >/=  2 cm not described below (TR1): 0 Number of mixed cystic and solid nodules >/= 1.5 cm not described below (TR2): 0 _________________________________________________________ Nodule # 1: Location: Isthmus Maximum size: 1.6 cm; Other 2 dimensions: 1.4 x 1.1 cm Composition: solid/almost completely solid (2) Echogenicity: hypoechoic (2) Shape: not taller-than-wide (0) Margins:  ill-defined (0) Echogenic foci: none (0) ACR TI-RADS total points: 4. ACR TI-RADS risk category: TR4 (4-6 points). ACR TI-RADS recommendations: **Given size (>/= 1.5 cm) and appearance, fine needle aspiration of this moderately suspicious nodule should be considered based on TI-RADS criteria. _________________________________________________________ Simple cyst in the inferior left lobe measures 0.9 cm in greatest diameter and has a benign appearance. No abnormal lymph nodes identified IMPRESSION: 1.6 cm isthmus nodule technically meets criteria for fine-needle aspiration. The nodule is borderline in size for biopsy and 1 year follow-up ultrasound could also be considered. The above is in keeping with the ACR TI-RADS recommendations - J Am Coll Radiol 2017;14:587-595. Electronically Signed   By: Aletta Edouard M.D.   On: 06/28/2019 15:41       IMPRESSION/PLAN: 1. 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  Initially I contacted the patient's son just to touch base and introduce the idea of our service.  This  time actually became more of a formal consultation though the patient was not present.  For purposes of good communication during our discussion, we would like to meet with the patient as an outpatient with the anticipation of offering postoperative stereotactic radiosurgery [facility]).  I spent time with her son Day discussing her work-up thus far, her pathology results from surgery, and recommendations for adjuvant radiosurgery.  We discussed delivery and logistics of treatment as well. She would need a 3T MRI scan prior to proceeding in this manner and we discussed the utility of simulation to plan her treatment.  I will try and order a 3T MRI scan prior to her discharge. 2. Deconditioning. The patient will continue under the guidance of the physical medicine and rehabilitation service. She is anticipated to remain at [facility] for approximately one week per her son Day.   3. Seizure activity at presentation secondary to #1. The patient will follow up as an outpatient with Dr. Mickeal Skinner for management of her antiepileptics and in long term surveillance upon the completion of her radiotherapy. This will be coordinated following her radiotherapy treatment.  In a visit lasting 60 minutes, greater than 50% of the time was spent by phone discussing the patient's condition, in preparation for the discussion, and coordinating the patient's care.     Carola Rhine, PAC

## 2019-07-06 NOTE — Progress Notes (Signed)
  Speech Language Pathology Treatment: Dysphagia;Cognitive-Linquistic  Patient Details Name: Sara Schroeder MRN: [id number] DOB: [birth year] Today's Date: 07/06/2019 Time: 4650-3546 SLP Time Calculation (min) (ACUTE ONLY): 32 min  Assessment / Plan / Recommendation Clinical Impression   Mrs. Altland was seen for dysphagia treatment/diet advancement and aphasia treatment.  Dysphagia: Pt/husband deny any difficulty with current diet of thin liquids and pureed solids. Pt was seen with regular solid graham cracker with timely mastication and adequate oral clearance. She does appear safe to upgrade to regular solids, however, she defers at this time, stating regular solids "scratch her throat." She does have oral candida, likely causing odynophagia, which is being treated medically. Overall, pt may upgrade to regular solids at her discretion. She remains at low risk for aspiration/aspiration PNA based on performance. She was noted with intermittent cough t/o session (with and without PO intake, not appearing to be r/t POs). Plan to complete dysphagia goals and focus therapy on aphasia at this time. Aphasia: pt reports ongoing difficulty both expressively and receptively. She named objects in room with acc'y in 8/8. She was able to state their function in 6/8. Divergent naming was targeted with pt requiring max cues to name 3 functional options (things to drink), making it convergent naming. With less abstract concepts (her family's names) she named 3/3 i'ly and quickly. She reports difficulty understanding her family as well, so comprehension was targeted. She followed 2-step directions with acc'y in 3/4 and 3-step directions with acc'y in 2/4. She was noted to leave off the first direction, questionably r/t working memory versus language impairment. She participated in brief conversation regarding her treatment plan (plans to go to inpatient rehab), which required questions be in closed form. When asked "what do you  think about going to rehab?" patient was unable to provide a response, but when asked "do you want to go to rehab?" she was able to verbalize "yes."   Husband was present and educated on communication strategies including use of closed ended questions, cueing for circumlocution, therapeutic tasks such as divergent naming, confrontation naming, convergent naming, and participating in structured conversation. They demonstrated understanding.    HPI HPI: 69 yo F presented to [facility] with new onset seizure. 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  Pt underwent L frontral craniotomy for tumor resection on 06/29/19.   Intubated due to recurring seizures.  Pt was intubated from 5/3-5/7.  Plans for CIR once bed is available.      SLP Plan  Continue with current plan of care;Goals updated       Recommendations  Diet recommendations: Dysphagia 1 (puree)(at patient request) Liquids provided via: Cup Medication Administration: Whole meds with liquid Supervision: Patient able to self feed Compensations: Slow rate;Small sips/bites Postural Changes and/or Swallow Maneuvers: Seated upright 90 degrees      SLP Visit Diagnosis: Dysphagia, unspecified (R13.10);Aphasia (R47.01) Plan: Continue with current plan of care;Goals updated                    Keerthi Hazell P. Rocquel Askren, M.S., CCC-SLP Speech-Language Pathologist Acute Rehabilitation Services Pager: [phone] 07/06/2019, 3:25 PM

## 2019-07-06 NOTE — Progress Notes (Signed)
Inpatient Rehabilitation Admissions Coordinator  I await bed availability to admit to CIR.  Danne Baxter, RN, MSN Rehab Admissions Coordinator [phone] 07/06/2019 2:33 PM

## 2019-07-07 ENCOUNTER — Inpatient Hospital Stay (HOSPITAL_COMMUNITY)
Admission: RE | Admit: 2019-07-07 | Discharge: 2019-07-12 | DRG: 092 | Disposition: A | Discharge status: Home / Self Care | Payer: Medicare Other | Source: Intra-hospital | Attending: Physical Medicine & Rehabilitation | Admitting: Physical Medicine & Rehabilitation

## 2019-07-07 ENCOUNTER — Encounter (HOSPITAL_COMMUNITY): Payer: Self-pay | Admitting: Physical Medicine & Rehabilitation

## 2019-07-07 DIAGNOSIS — I10 Essential (primary) hypertension: Secondary | ICD-10-CM | POA: Diagnosis not present

## 2019-07-07 DIAGNOSIS — I4892 Unspecified atrial flutter: Secondary | ICD-10-CM | POA: Diagnosis present

## 2019-07-07 DIAGNOSIS — G40909 Epilepsy, unspecified, not intractable, without status epilepticus: Secondary | ICD-10-CM | POA: Diagnosis present

## 2019-07-07 DIAGNOSIS — D72829 Elevated white blood cell count, unspecified: Secondary | ICD-10-CM | POA: Diagnosis not present

## 2019-07-07 DIAGNOSIS — R569 Unspecified convulsions: Secondary | ICD-10-CM | POA: Diagnosis not present

## 2019-07-07 DIAGNOSIS — E785 Hyperlipidemia, unspecified: Secondary | ICD-10-CM | POA: Diagnosis not present

## 2019-07-07 DIAGNOSIS — R000 Tachycardia, unspecified: Secondary | ICD-10-CM

## 2019-07-07 DIAGNOSIS — D72828 Other elevated white blood cell count: Secondary | ICD-10-CM | POA: Diagnosis not present

## 2019-07-07 DIAGNOSIS — G479 Sleep disorder, unspecified: Secondary | ICD-10-CM | POA: Diagnosis not present

## 2019-07-07 DIAGNOSIS — B370 Candidal stomatitis: Secondary | ICD-10-CM | POA: Diagnosis present

## 2019-07-07 DIAGNOSIS — C799 Secondary malignant neoplasm of unspecified site: Secondary | ICD-10-CM | POA: Diagnosis present

## 2019-07-07 DIAGNOSIS — C7931 Secondary malignant neoplasm of brain: Secondary | ICD-10-CM | POA: Diagnosis present

## 2019-07-07 DIAGNOSIS — F419 Anxiety disorder, unspecified: Secondary | ICD-10-CM | POA: Diagnosis present

## 2019-07-07 DIAGNOSIS — J9601 Acute respiratory failure with hypoxia: Secondary | ICD-10-CM | POA: Diagnosis not present

## 2019-07-07 DIAGNOSIS — C3490 Malignant neoplasm of unspecified part of unspecified bronchus or lung: Secondary | ICD-10-CM | POA: Diagnosis not present

## 2019-07-07 DIAGNOSIS — R2689 Other abnormalities of gait and mobility: Principal | ICD-10-CM | POA: Diagnosis present

## 2019-07-07 DIAGNOSIS — T380X5A Adverse effect of glucocorticoids and synthetic analogues, initial encounter: Secondary | ICD-10-CM | POA: Diagnosis not present

## 2019-07-07 LAB — CBC WITH DIFFERENTIAL/PLATELET
Abs Immature Granulocytes: 0.33 10*3/uL — ABNORMAL HIGH (ref 0.00–0.07)
Basophils Absolute: 0 10*3/uL (ref 0.0–0.1)
Basophils Relative: 0 %
Eosinophils Absolute: 0.2 10*3/uL (ref 0.0–0.5)
Eosinophils Relative: 1 %
HCT: 37.6 % (ref 36.0–46.0)
Hemoglobin: 12.1 g/dL (ref 12.0–15.0)
Immature Granulocytes: 3 %
Lymphocytes Relative: 20 %
Lymphs Abs: 2.6 10*3/uL (ref 0.7–4.0)
MCH: 30.5 pg (ref 26.0–34.0)
MCHC: 32.2 g/dL (ref 30.0–36.0)
MCV: 94.7 fL (ref 80.0–100.0)
Monocytes Absolute: 0.6 10*3/uL (ref 0.1–1.0)
Monocytes Relative: 5 %
Neutro Abs: 8.8 10*3/uL — ABNORMAL HIGH (ref 1.7–7.7)
Neutrophils Relative %: 71 %
Platelets: 305 10*3/uL (ref 150–400)
RBC: 3.97 MIL/uL (ref 3.87–5.11)
RDW: 13.4 % (ref 11.5–15.5)
WBC: 12.5 10*3/uL — ABNORMAL HIGH (ref 4.0–10.5)
nRBC: 0 % (ref 0.0–0.2)

## 2019-07-07 LAB — BASIC METABOLIC PANEL
Anion gap: 10 (ref 5–15)
BUN: 24 mg/dL — ABNORMAL HIGH (ref 8–23)
CO2: 26 mmol/L (ref 22–32)
Calcium: 9.5 mg/dL (ref 8.9–10.3)
Chloride: 102 mmol/L (ref 98–111)
Creatinine, Ser: 0.75 mg/dL (ref 0.44–1.00)
GFR calc Af Amer: >60 mL/min (ref >60)
GFR calc non Af Amer: >60 mL/min (ref >60)
Glucose, Bld: 140 mg/dL — ABNORMAL HIGH (ref 70–99)
Potassium: 4 mmol/L (ref 3.5–5.1)
Sodium: 138 mmol/L (ref 135–145)

## 2019-07-07 MED ORDER — ENSURE ENLIVE PO LIQD
237.0000 mL | Freq: Three times a day (TID) | ORAL | Status: DC
  Administered 2019-07-07 – 2019-07-10 (×8): 237 mL via ORAL

## 2019-07-07 MED ORDER — PRO-STAT SUGAR FREE PO LIQD: 30.0000 mL | Freq: Two times a day (BID) | ORAL | 0 refills | Status: DC

## 2019-07-07 MED ORDER — MAGIC MOUTHWASH W/LIDOCAINE
5.0000 mL | Freq: Three times a day (TID) | ORAL | Status: DC | PRN
  Administered 2019-07-08: 5 mL via ORAL
  Filled 2019-07-07 (×2): qty 5

## 2019-07-07 MED ORDER — ADULT MULTIVITAMIN W/MINERALS CH
1.0000 | ORAL_TABLET | Freq: Every day | ORAL | Status: DC
  Administered 2019-07-08 – 2019-07-12 (×5): 1 via ORAL
  Filled 2019-07-07 (×5): qty 1

## 2019-07-07 MED ORDER — PRO-STAT SUGAR FREE PO LIQD
30.0000 mL | Freq: Two times a day (BID) | ORAL | Status: DC
  Administered 2019-07-07 – 2019-07-10 (×7): 30 mL via ORAL
  Filled 2019-07-07 (×7): qty 30

## 2019-07-07 MED ORDER — NYSTATIN 100000 UNIT/ML MT SUSP
5.0000 mL | Freq: Four times a day (QID) | OROMUCOSAL | Status: AC
  Administered 2019-07-07 – 2019-07-08 (×5): 500000 [IU] via ORAL
  Filled 2019-07-07 (×4): qty 5

## 2019-07-07 MED ORDER — ENOXAPARIN SODIUM 40 MG/0.4ML ~~LOC~~ SOLN
40.0000 mg | SUBCUTANEOUS | Status: DC
  Administered 2019-07-07 – 2019-07-10 (×4): 40 mg via SUBCUTANEOUS
  Filled 2019-07-07 (×4): qty 0.4

## 2019-07-07 MED ORDER — FLUCONAZOLE 100 MG PO TABS
100.0000 mg | ORAL_TABLET | Freq: Every day | ORAL | Status: DC
  Administered 2019-07-08 – 2019-07-12 (×5): 100 mg via ORAL
  Filled 2019-07-07 (×5): qty 1

## 2019-07-07 MED ORDER — LEVETIRACETAM 100 MG/ML PO SOLN: 1000.0000 mg | Freq: Two times a day (BID) | ORAL | 0 refills | Status: DC

## 2019-07-07 MED ORDER — DOCUSATE SODIUM 50 MG/5ML PO LIQD
100.0000 mg | Freq: Two times a day (BID) | ORAL | Status: DC
  Administered 2019-07-07 – 2019-07-08 (×2): 100 mg via ORAL
  Filled 2019-07-07 (×2): qty 10

## 2019-07-07 MED ORDER — ADULT MULTIVITAMIN W/MINERALS CH: 1.0000 | ORAL_TABLET | Freq: Every day | ORAL | 0 refills | Status: AC

## 2019-07-07 MED ORDER — AZITHROMYCIN 250 MG PO TABS: 250.0000 mg | ORAL_TABLET | Freq: Every day | ORAL | Status: DC

## 2019-07-07 MED ORDER — FLUCONAZOLE 100 MG PO TABS: 100.0000 mg | ORAL_TABLET | Freq: Every day | ORAL | 0 refills | Status: DC

## 2019-07-07 MED ORDER — ENOXAPARIN SODIUM 40 MG/0.4ML ~~LOC~~ SOLN: 40.0000 mg | SUBCUTANEOUS | Status: DC

## 2019-07-07 MED ORDER — AZITHROMYCIN 250 MG PO TABS: ORAL_TABLET | ORAL | 0 refills | Status: DC

## 2019-07-07 MED ORDER — LEVETIRACETAM 100 MG/ML PO SOLN
1000.0000 mg | Freq: Two times a day (BID) | ORAL | Status: DC
  Administered 2019-07-07 – 2019-07-08 (×2): 1000 mg via ORAL
  Filled 2019-07-07 (×2): qty 10

## 2019-07-07 MED ORDER — ACETAMINOPHEN 325 MG PO TABS
650.0000 mg | ORAL_TABLET | ORAL | Status: DC | PRN
  Administered 2019-07-07: 650 mg via ORAL
  Filled 2019-07-07: qty 2

## 2019-07-07 MED ORDER — ENSURE ENLIVE PO LIQD: 237.0000 mL | Freq: Three times a day (TID) | ORAL | 0 refills | Status: DC

## 2019-07-07 MED ORDER — SORBITOL 70 % SOLN: 30.0000 mL | Freq: Every day | Status: DC | PRN

## 2019-07-07 MED ORDER — AZITHROMYCIN 500 MG PO TABS
500.0000 mg | ORAL_TABLET | Freq: Every day | ORAL | Status: AC
  Administered 2019-07-07: 500 mg via ORAL
  Filled 2019-07-07: qty 1

## 2019-07-07 NOTE — Discharge Summary (Addendum)
Discharge Summary  Sara Schroeder JJH:[id number] DOB: 1950/04/19  PCP: [facility], [place] date: 06/27/2019 Discharge date: 07/07/2019  Time spent: 35 minutes  Recommendations for Outpatient Follow-up:  1. Follow-up with neurosurgery 2. Follow-up with oncology 3. Follow-up with radiation oncology 4. Follow-up with your primary care provider 5. Take your medications as prescribed. 6. Continue PT OT with assistance and fall precautions  Discharge Diagnoses:  Active Hospital Problems   Diagnosis Date Noted  . Metastatic adenocarcinoma to brain ([place])   . Leucocytosis   . Tachycardia   . Seizures ([place])   . Acute hypoxemic respiratory failure ([place])   . Intracerebral hemorrhage ([place]) 06/27/2019    Resolved Hospital Problems  No resolved problems to display.    Discharge Condition: Stable  Diet recommendation: Please follow recommendations from speech therapist:  Recommendations  Diet recommendations: Dysphagia 1 (puree)(at patient request) Liquids provided via: Cup Medication Administration: Whole meds with liquid Supervision: Patient able to self feed Compensations: Slow rate;Small sips/bites Postural Changes and/or Swallow Maneuvers: Seated upright 90 degrees      SLP Visit Diagnosis: Dysphagia, unspecified (R13.10);Aphasia (R47.01) Plan: Continue with current plan of care;Goals updated         Vitals:   07/07/19 0903 07/07/19 0905  BP: 116/62 116/62  Pulse: (!) 110 (!) 110  Resp: 18 18  Temp: 98.5 F (36.9 C) 98.5 F (36.9 C)  SpO2:  98%    History of present illness:  Sara Schroeder is a 69 yo F with h/o HTN, anxiety, and MDD who presented with seizure and was found to have a large hemorrhagic L frontal lobe mass. Briefly, she had new onset seizure at [facility] on 5/3 characterized by R-sided convulsions and twitching. EMS arrived and she had another seizure in transit. She was given Ativan and Keppra at [facility] ED. Vitals on presentation significant for  elevated BP of 170s/90s, HR 100s, T37.  Labs were unremarkable except WBC of 17.2 K. CT head showed 2.7x2.0x2.8cm hemorrhagic mass in the L frontal lobe. Pt was intubated for airway protection and transferred to [facility] ICU. Pt's son and granddaughter deny history of seizures.   Post left frontal lobe hemorrhagic mass resection on 06/29/19 by Dr. Zada Finders, neurosurgery.  Path returned positive for metastatic lung carcinoma.    Seen by oncology and radiation oncology.  Plan is to get a PET scan outpatient after completing rehab and possibly going through post operative stereotactic radiosurgery.  07/07/19: Seen and examined with her son Sara Schroeder and husband at bedside.  Her odynophagia is improved after starting oral Diflucan yesterday.  No other new complaints.  Patient has been accepted to inpatient rehab and will continue her PT and OT there.   Hospital Course:  Active Problems:   Intracerebral hemorrhage (HCC)   Acute hypoxemic respiratory failure (HCC)   Metastatic adenocarcinoma to brain (HCC)   Leucocytosis   Tachycardia   Seizures (HCC)  Left frontal lobe hemorrhagic mass s/p resection on 06/29/19 by Dr. Zada Finders.  Surgical pathology 06/29/19 positive for metastatic lung adenocarcinoma. -appreciate onc eval  Plan Outpatient PET scan Completed course of Decadron  Continue Keppra 1000 mg twice daily Continue seizures precautions Follow-up with neurosurgery, oncology, and radiation oncology  Newly diagnosed metastatic lung adenocarcinoma with solitary brain metastasis.   Has a spiculated right middle lobe pulmonary nodule measuring 11 mm. Surgical pathology 06/29/19 returned positive for metastatic lung adenocarcinoma. Will need a PET scan outpatient EGFR mutation assessment, molecular markers sent off by oncology Possible radiation oncology involvement outpatient  with plan for postoperative stereotactic radiosurgery.  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 Continue seizure precautions Do not drive or operate heavy machinery for at least 6 months unless cleared by your neurosurgeon.  Oral thrush/ odynophagia Symptomatology is improving Received 1 dose of oral Diflucan 200 mg on 07/06/2019 Continue oral Diflucan 100 mg daily x 10 days  Cough, improving Likely in the setting of lung cancer Personally reviewed chest x-ray which showed no clear evidence of pulmonary infiltrates Showing atelectasis, patient advised to use incentive spirometer and flutter valve. Procalcitonin less than 0.10 Start Z-Pak x5 days for its anti-inflammatory effects.  Leukocytosis likely reactive in the setting of recent steroids WBC is trending down  Improving acute toxic and metabolic encephalopathy. Suspect some of this reflects a postictal state superimposed on recent brain met Plan Stable, at baseline.  Resolved Abdominal pain  Hypertension BP is stable but soft.  Hyperlipidemia Resume statin Follow-up with your PCP  Chronic anxiety/depression Home Prozac held due to recent seizures Her mood is stable. She has not received as needed benzodiazepines since 07/02/2019.  We will continue to hold.  Fluid and electrolyte Imbalance  Plan Reviewed and are stable.  Exophytic thyroid mass  Low TSH Plan TSH 0.124, free T4 normal 0.99 (06/27/19) Will complete PET scan as out-pt  Transient atrial flutter, twelve-lead EKG done on 06/26/2019 Suspect in the setting of her lung physiology Denies any symptoms Her blood pressure has been soft, not on rate control agents Due to presenting hemorrhagic mass, has had limited anticoagulation. Continue to monitor if recurs may consider follow-up with cardiology  Ambulatory dysfunction PT  OT recommended CIR Plan to discharge to [place] with assistance and fall precautions  Best practice:     Code Status: FULL       Procedures:  Resection of left frontal hemorrhagic mass on 06/29/2019  Consultations:  Neurosurgery  Oncology  Radiation oncology  Discharge Exam: BP 116/62   Pulse (!) 110   Temp 98.5 F (36.9 C) (Oral)   Resp 18   Ht 5' 2"  (1.575 m)   Wt 61.6 kg   SpO2 98%   BMI 24.84 kg/m  . General: 69 y.o. year-old female well developed well nourished in no acute distress.  Alert and interactive. . Cardiovascular: Regular rate and rhythm with no rubs or gallops.    Marland Kitchen Respiratory: Clear to auscultation with no wheezes or rales. . Abdomen: Soft nontender nondistended with normal bowel sounds x4 quadrants. . Musculoskeletal: No lower extremity edema bilaterally. Marland Kitchen Psychiatry: Mood is appropriate for condition and setting  Discharge Instructions You were cared for by a hospitalist during your hospital stay. If you have any questions about your discharge medications or the care you received while you were in the hospital after you are discharged, you can call the unit and asked to speak with the hospitalist on call if the hospitalist that took care of you is not available. Once you are discharged, your primary care physician will handle any further medical issues. Please note that NO REFILLS for any discharge medications will be authorized once you are discharged, as it is imperative that you return to your primary care physician (or establish a relationship with a primary care physician if you do not have one) for your  aftercare needs so that they can reassess your need for medications and monitor your lab values.   Allergies as of 07/07/2019      Reactions   Lisinopril Other (See Comments)   Per med list from [facility]       Medication List    STOP taking these medications   FLUoxetine 20 MG capsule Commonly  known as: PROZAC   hydrochlorothiazide 25 MG tablet Commonly known as: HYDRODIURIL   LORazepam 0.5 MG tablet Commonly known as: ATIVAN   propranolol 10 MG tablet Commonly known as: INDERAL   traZODone 50 MG tablet Commonly known as: DESYREL     TAKE these medications   azithromycin 250 MG tablet Commonly known as: ZITHROMAX Take 500 mg daily x1 day, then 250 mg daily x 4 days. Start taking on: Jul 08, 2019   Citrucel oral powder Generic drug: methylcellulose Take 1 packet by mouth daily. Mix with 8oz of water or juice   dicyclomine 20 MG tablet Commonly known as: BENTYL Take 20 mg by mouth 4 (four) times daily as needed for spasms (abdominal pain).   feeding supplement (ENSURE ENLIVE) Liqd Take 237 mLs by mouth 3 (three) times daily between meals for 7 days.   feeding supplement (PRO-STAT SUGAR FREE 64) Liqd Take 30 mLs by mouth 2 (two) times daily.   fluconazole 100 MG tablet Commonly known as: DIFLUCAN Take 1 tablet (100 mg total) by mouth daily for 10 days. Start taking on: Jul 08, 2019   levETIRAcetam 100 MG/ML solution Commonly known as: KEPPRA Take 10 mLs (1,000 mg total) by mouth 2 (two) times daily.   melatonin 1 MG Tabs tablet Take 1-3 mg by mouth at bedtime as needed (sleep).   multivitamin with minerals Tabs tablet Take 1 tablet by mouth daily. Start taking on: Jul 08, 2019   omeprazole 20 MG capsule Commonly known as: PRILOSEC Take 20 mg by mouth daily.   pravastatin 20 MG tablet Commonly known as: PRAVACHOL Take 20 mg by mouth every evening.      Allergies  Allergen Reactions  . Lisinopril Other (See Comments)    Per med list from [place], [facility]. Call in 1 day(s).   Why: please call for a post hospital follow up appointment. Contact information: [address] [place] [address] [phone]        Ventura Sellers, MD. Call in 1 day(s).   Specialties:  Psychiatry, Neurology, Oncology Why: Please call for a post hospital follow up appointment Contact information: [place] [address] [phone]        Judith Part, MD. Call in 1 day(s).   Specialty: Neurosurgery Why: Please call for a post hospital follow up appointment Contact information: [place] [address] [phone]            The results of significant diagnostics from this hospitalization (including imaging, microbiology, ancillary and laboratory) are listed below for reference.    Significant Diagnostic Studies: EEG  Result Date: 06/27/2019 Lora Havens, MD     06/27/2019  9:56 AM Patient Name: Adrie Picking MRN: [id number] Epilepsy Attending: Lora Havens Referring Physician/Provider: Eliseo Gum, NP Date: 06/27/2019 Duration: 28.21 minutes Patient history: 69 year old female presented with seizure-like episode and was found to have left frontal lobe hemorrhagic mass.  EEG evaluate for seizures. Level of alertness: Comatose AEDs during EEG study: Keppra, propofol  Technical aspects: This EEG study was done with scalp electrodes positioned according to the 10-20 International system of electrode placement. Electrical activity was acquired at a sampling rate of 500Hz  and reviewed with a high frequency filter of 70Hz  and a low frequency filter of 1Hz . EEG data were recorded continuously and digitally stored. Description: EEG showed an excessive amount of 15 to 18 Hz, sharply contoured beta activity with irregular morphology distributed symmetrically and diffusely.   Intermittent generalized background attenuation was also noted. Hyperventilation and photic stimulation were not performed. Abnormality -Excessive beta, generalized -Background attenuation, generalized IMPRESSION: This study is suggestive of severe to profound diffuse encephalopathy, nonspecific etiology but most likely secondary to sedation. No seizures or definite  epileptiform discharges were seen throughout the recording. Priyanka [name] [name]   CT ABDOMEN PELVIS WO CONTRAST  Result Date: 06/27/2019 CLINICAL DATA:  Brain mass. Lung nodule. EXAM: CT CHEST, ABDOMEN AND PELVIS WITHOUT CONTRAST TECHNIQUE: Multidetector CT imaging of the chest, abdomen and pelvis was performed following the standard protocol without IV contrast. COMPARISON:  Chest radiograph yesterday. CT chest abdomen pelvis 03/01/2018 FINDINGS: CT CHEST FINDINGS Cardiovascular: Aortic atherosclerosis. No aortic aneurysm. Heart is normal in size. No pericardial effusion. Mediastinum/Nodes: Accurate assessment for hilar adenopathy is limited in the absence of IV contrast. There are no enlarged mediastinal lymph nodes. Endotracheal tube with tip above the carina. Enteric tube decompresses the esophagus. There is a 16 mm nodule that abuts the isthmus of the thyroid, likely unchanged from prior, on the previous exam portions of this region obscured by streak artifact from IV contrast. There is no axillary adenopathy. Lungs/Pleura: Spiculated right middle lobe pulmonary nodule measures 11 x 11 x 10 mm, only minimally increased in size from prior exam. Margins are irregular and spiculated with small spiculation extending to the pleural surface anteriorly, minimal pleural thickening. No other pulmonary nodule or mass. Dependent opacities in the lung bases favor atelectasis. Trace pleural thickening without significant effusion. Musculoskeletal: No blastic or evidence of destructive lytic lesion. Ordinary degenerative change in the spine. No obvious breast mass. CT ABDOMEN PELVIS FINDINGS Hepatobiliary: Lack of IV contrast limits assessment for focal liver lesion. Capsular calcification involving the dome. No evidence of focal hepatic lesion. Gallbladder physiologically distended, no calcified stone. No biliary dilatation. Pancreas: No ductal dilatation or inflammation. No evidence of focal lesion on this noncontrast  exam. Spleen: Normal in size without focal abnormality. Adrenals/Urinary Tract: Slight adrenal thickening without renal nodule. No hydronephrosis. Mild prominence of both renal collecting systems. No evidence of focal renal lesion on noncontrast exam. Urinary bladder is decompressed by Foley catheter. Stomach/Bowel: Enteric tube tip in the stomach. Contrast and fluid in the stomach without evidence of focal gastric mass. No small bowel wall thickening or inflammation. No obstruction, administered enteric contrast reaches the colon. Normal contrast filled appendix. Moderate stool distally. Vascular/Lymphatic: Aortic atherosclerosis. No aortic aneurysm. No enlarged lymph nodes in the abdomen or pelvis, detailed assessment limited in the absence of IV contrast. Small calcified lymph node adjacent to the proximal sigmoid colon, suggesting prior granulomatous disease. Reproductive: Uterus and bilateral adnexa are unremarkable. No evidence of adnexal mass. Other: No ascites or free fluid. No omental thickening. Musculoskeletal: No blastic or destructive lytic lesions. Transitional lumbosacral anatomy. Degenerative disc disease at L4-L5. There are no acute or suspicious osseous abnormalities. Calcified granuloma in the subcutaneous soft tissues. No suspicious skin or subcutaneous lesion. IMPRESSION: 1. Spiculated right middle lobe pulmonary nodule measuring 11 x 11 x 10 mm, only minimally increased in  size from prior exam. This is suspicious for primary bronchogenic malignancy. Evaluation of the hila is limited in the absence of IV contrast, allowing for this limitation, no evidence of nodal metastasis. 2. No other evidence of metastatic disease or primary malignancy in the chest, abdomen, or pelvis. 3. Exophytic 16 mm nodule from the thyroid isthmus, patient had previous thyroid uptake and scan at an outside institution that reported this nodule, however size was not specified, and those images are not available. No  thyroid ultrasounds are available for review. Therefore, recommend thyroid US (ref: J Am Coll Radiol. 2015 Feb;12(2): 143-50), unless performed elsewhere. 4. Dependent opacities in the lung bases favor atelectasis. Aortic Atherosclerosis (ICD10-I70.0). Electronically Signed   By: Keith Rake M.D.   On: 06/27/2019 16:56   CT Head Wo Contrast  Result Date: 06/26/2019 CLINICAL DATA:  Seizure EXAM: CT HEAD WITHOUT CONTRAST TECHNIQUE: Contiguous axial images were obtained from the base of the skull through the vertex without intravenous contrast. COMPARISON:  None. FINDINGS: Brain: There is hemorrhage in the left frontal lobe measuring 2.7 x 2.0 x 2.8 cm with a large amount surrounding vasogenic edema. This is likely a mass. Brain parenchyma is otherwise normal. There is no herniation. 2 mm of rightward midline shift. Vascular: Negative Skull: Normal Sinuses/Orbits: Clear sinuses. Normal orbits. Other: None IMPRESSION: Left frontal lobe hemorrhagic mass with surrounding vasogenic edema and 2 mm of rightward midline shift. MRI with and without contrast is recommended for further characterization. Critical Value/emergent results were called by telephone at the time of interpretation on 06/26/2019 at 8:39 pm to provider [facility] , who verbally acknowledged these results. Electronically Signed   By: Ulyses Jarred M.D.   On: 06/26/2019 20:39   CT CHEST WO CONTRAST  Result Date: 06/27/2019 CLINICAL DATA:  Brain mass. Lung nodule. EXAM: CT CHEST, ABDOMEN AND PELVIS WITHOUT CONTRAST TECHNIQUE: Multidetector CT imaging of the chest, abdomen and pelvis was performed following the standard protocol without IV contrast. COMPARISON:  Chest radiograph yesterday. CT chest abdomen pelvis 03/01/2018 FINDINGS: CT CHEST FINDINGS Cardiovascular: Aortic atherosclerosis. No aortic aneurysm. Heart is normal in size. No pericardial effusion. Mediastinum/Nodes: Accurate assessment for hilar adenopathy is limited in the absence  of IV contrast. There are no enlarged mediastinal lymph nodes. Endotracheal tube with tip above the carina. Enteric tube decompresses the esophagus. There is a 16 mm nodule that abuts the isthmus of the thyroid, likely unchanged from prior, on the previous exam portions of this region obscured by streak artifact from IV contrast. There is no axillary adenopathy. Lungs/Pleura: Spiculated right middle lobe pulmonary nodule measures 11 x 11 x 10 mm, only minimally increased in size from prior exam. Margins are irregular and spiculated with small spiculation extending to the pleural surface anteriorly, minimal pleural thickening. No other pulmonary nodule or mass. Dependent opacities in the lung bases favor atelectasis. Trace pleural thickening without significant effusion. Musculoskeletal: No blastic or evidence of destructive lytic lesion. Ordinary degenerative change in the spine. No obvious breast mass. CT ABDOMEN PELVIS FINDINGS Hepatobiliary: Lack of IV contrast limits assessment for focal liver lesion. Capsular calcification involving the dome. No evidence of focal hepatic lesion. Gallbladder physiologically distended, no calcified stone. No biliary dilatation. Pancreas: No ductal dilatation or inflammation. No evidence of focal lesion on this noncontrast exam. Spleen: Normal in size without focal abnormality. Adrenals/Urinary Tract: Slight adrenal thickening without renal nodule. No hydronephrosis. Mild prominence of both renal collecting systems. No evidence of focal renal lesion on noncontrast exam. Urinary  bladder is decompressed by Foley catheter. Stomach/Bowel: Enteric tube tip in the stomach. Contrast and fluid in the stomach without evidence of focal gastric mass. No small bowel wall thickening or inflammation. No obstruction, administered enteric contrast reaches the colon. Normal contrast filled appendix. Moderate stool distally. Vascular/Lymphatic: Aortic atherosclerosis. No aortic aneurysm. No  enlarged lymph nodes in the abdomen or pelvis, detailed assessment limited in the absence of IV contrast. Small calcified lymph node adjacent to the proximal sigmoid colon, suggesting prior granulomatous disease. Reproductive: Uterus and bilateral adnexa are unremarkable. No evidence of adnexal mass. Other: No ascites or free fluid. No omental thickening. Musculoskeletal: No blastic or destructive lytic lesions. Transitional lumbosacral anatomy. Degenerative disc disease at L4-L5. There are no acute or suspicious osseous abnormalities. Calcified granuloma in the subcutaneous soft tissues. No suspicious skin or subcutaneous lesion. IMPRESSION: 1. Spiculated right middle lobe pulmonary nodule measuring 11 x 11 x 10 mm, only minimally increased in size from prior exam. This is suspicious for primary bronchogenic malignancy. Evaluation of the hila is limited in the absence of IV contrast, allowing for this limitation, no evidence of nodal metastasis. 2. No other evidence of metastatic disease or primary malignancy in the chest, abdomen, or pelvis. 3. Exophytic 16 mm nodule from the thyroid isthmus, patient had previous thyroid uptake and scan at an outside institution that reported this nodule, however size was not specified, and those images are not available. No thyroid ultrasounds are available for review. Therefore, recommend thyroid US (ref: J Am Coll Radiol. 2015 Feb;12(2): 143-50), unless performed elsewhere. 4. Dependent opacities in the lung bases favor atelectasis. Aortic Atherosclerosis (ICD10-I70.0). Electronically Signed   By: Keith Rake M.D.   On: 06/27/2019 16:56   MR BRAIN W WO CONTRAST  Result Date: 06/30/2019 CLINICAL DATA:  Post mass resection EXAM: MRI HEAD WITHOUT AND WITH CONTRAST TECHNIQUE: Multiplanar, multiecho pulse sequences of the brain and surrounding structures were obtained without and with intravenous contrast. CONTRAST:  70m GADAVIST GADOBUTROL 1 MMOL/ML IV SOLN COMPARISON:   06/27/2019 FINDINGS: Brain: Post resection of left mass with thin extra-axial collection and resection cavity containing fluid and blood products. There is reduced diffusion at the deep margin likely reflecting postoperative contusion. There is no residual nodular enhancement. Associated edema and regional mass effect are similar. Vascular: Major vessel flow voids at the skull base are preserved. Skull and upper cervical spine: Post left frontal craniotomy. Normal marrow signal is preserved. Sinuses/Orbits: Paranasal sinuses are aerated. Orbits are unremarkable. Other: Sella is unremarkable.  Mastoid air cells are clear. IMPRESSION: Expected postoperative changes of gross total resection of left frontal mass. Surrounding edema and mild mass effect are similar. Electronically Signed   By: PMacy MisM.D.   On: 06/30/2019 08:08   MR BRAIN W WO CONTRAST  Result Date: 06/27/2019 CLINICAL DATA:  Brain mass.  History of lung nodule. EXAM: MRI HEAD WITHOUT AND WITH CONTRAST TECHNIQUE: Multiplanar, multiecho pulse sequences of the brain and surrounding structures were obtained without and with intravenous contrast. CONTRAST:  5.536mGADAVIST GADOBUTROL 1 MMOL/ML IV SOLN COMPARISON:  CT head 06/26/2019. MRI head without with contrast 06/27/2019 FINDINGS: Brain: SRS protocol with thin sections performed on 3 tesla MRI. Solitary mass left frontal lobe with evidence of internal hemorrhage. The mass measures 2.2 x 2.3 x 2.8 cm. There is extensive vasogenic edema in the white matter. This is causing mass-effect and mild midline shift to the right of approximately 2 mm. Ventricle size normal. No second lesion identified. Mild chronic  microvascular ischemic change in the white matter. Negative for acute infarct. Vascular: Normal arterial flow voids Skull and upper cervical spine: No focal skeletal lesion. Sinuses/Orbits: Mild mucosal edema paranasal sinuses. Bilateral orbits Other: None IMPRESSION: Solitary hemorrhagic mass  left frontal lobe measuring 2.2 x 2.3 x 2.8 cm. Extensive vasogenic edema. 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 Primary brain tumor not likely. Electronically Signed   By: Franchot Gallo M.D.   On: 06/27/2019 17:43   MR BRAIN W WO CONTRAST  Result Date: 06/27/2019 CLINICAL DATA:  Mass follow-up EXAM: MRI HEAD WITHOUT AND WITH CONTRAST TECHNIQUE: Multiplanar, multiecho pulse sequences of the brain and surrounding structures were obtained without and with intravenous contrast. CONTRAST:  71m GADAVIST GADOBUTROL 1 MMOL/ML IV SOLN COMPARISON:  Head CT from yesterday FINDINGS: Brain: 2.6 cm left lateral frontal mass which appears intra-axial. The mass is T2 hypointense and mildly dense by CT. There is patchy T1 shortening compatible with blood products based on gradient imaging. There is prominent adjacent vasogenic edema. The mass is diffusely enhancing and not a simple hematoma. No second mass is seen.  No infarct, hydrocephalus, or collection. Vascular: Normal flow voids and vascular enhancements Skull and upper cervical spine: Normal marrow signal Sinuses/Orbits: Negative IMPRESSION: 1. 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 There was a worrisome pulmonary nodule on a January 2020 chest CT, updated chest CT may be contributory. 2. Extensive vasogenic edema. Electronically Signed   By: JMonte FantasiaM.D.   On: 06/27/2019 08:12   DG CHEST PORT 1 VIEW  Result Date: 07/05/2019 CLINICAL DATA:  Rales. Additional provided: Patient recently presented with seizure was found to have a large hemorrhagic left frontal lobe mass. EXAM: PORTABLE CHEST 1 VIEW COMPARISON:  Chest radiograph 06/28/2019, chest CT 06/27/2019 FINDINGS: Heart size within normal limits. Mild ill-defined opacity at the right lung base. Redemonstrated right middle lobe lung nodule better characterized on chest CT 06/27/2019. The left lung is clear. No evidence of pleural effusion or pneumothorax.  No acute bony abnormality identified. IMPRESSION: Mild ill-defined opacity at the right lung base which may reflect atelectasis or pneumonia. Redemonstrated right middle lobe pulmonary nodule better characterized on prior chest CT 06/27/2019. Electronically Signed   By: KKellie SimmeringDO   On: 07/05/2019 11:06   DG CHEST PORT 1 VIEW  Result Date: 06/28/2019 CLINICAL DATA:  Acute hypoxemic respiratory failure EXAM: PORTABLE CHEST 1 VIEW COMPARISON:  CT 06/27/2019 FINDINGS: *Endotracheal tube in the mid trachea 3 cm from the carina. *Transesophageal tube tip and side port distal to the GE junction curling in the left upper quadrant. *Telemetry leads and support devices overlie the chest. Redemonstration of a right middle lobe pulmonary nodule better seen on comparison CT. Mild basilar atelectatic changes. No acute consolidative opacity or convincing features of edema. No pneumothorax or effusion. Cardiomediastinal contours are stable. No acute osseous or soft tissue abnormality. Degenerative changes are present in the imaged spine and shoulders. IMPRESSION: 1. Mild basilar atelectatic changes. 2. Redemonstration of right middle lobe pulmonary nodule better seen on comparison CT. 3. Lines and tubes as above. Electronically Signed   By: PLovena LeM.D.   On: 06/28/2019 06:27   DG Chest Port 1 View  Result Date: 06/27/2019 CLINICAL DATA:  Intubation. EXAM: PORTABLE CHEST 1 VIEW COMPARISON:  Chest x-ray 06/26/2019, 03/01/2018. FINDINGS: Endotracheal tube, NG tube in good anatomic position. NG tube has been advanced its tip and side hole in the stomach. Heart size normal.  As previously noted on prior study of 06/26/2018 right lower lobe nodule appears larger. Again further evaluation with chest CT is suggested. Mild left base subsegmental atelectasis. No pleural effusion or pneumothorax. IMPRESSION: 1. Lines and tubes in good anatomic position. NG tube has been advanced its tip and side hole are within the stomach.  2. As noted on prior study of 06/26/2018 right lower lobe pulmonary nodule appears larger. Again further evaluation with chest CT suggested. These results will be called to the ordering clinician or representative by the Radiologist Assistant, and communication documented in the PACS or [employer]. Electronically Signed   By: Marcello Moores  Register   On: 06/27/2019 08:42   DG Chest Portable 1 View  Result Date: 06/26/2019 CLINICAL DATA:  69 year old female status post intubation. EXAM: PORTABLE CHEST 1 VIEW COMPARISON:  Earlier radiograph dated 06/26/2019. FINDINGS: Endotracheal tube with tip approximately 18 mm above the carina. Recommend retraction by 2-3 cm for optimal positioning. Enteric tube with side port above the GE junction. Recommend further advancing by additional 8 cm. No other interval change. IMPRESSION: 1. Endotracheal tube above the carina. 2. Enteric tube with side port above the GE junction. Recommend further advancing by additional 8 cm. Electronically Signed   By: Anner Crete M.D.   On: 06/26/2019 21:27   DG Chest Portable 1 View  Result Date: 06/26/2019 CLINICAL DATA:  Fever EXAM: PORTABLE CHEST 1 VIEW COMPARISON:  03/01/2018 FINDINGS: The heart size and mediastinal contours are within normal limits. Right lower lung nodule may be slightly increased, now measuring 14 mm. The visualized skeletal structures are unremarkable. IMPRESSION: No active disease. Suspected slight increase in size of right lower lung nodule. Follow-up chest CT is recommended. Electronically Signed   By: Donavan Foil M.D.   On: 06/26/2019 20:48   EEG adult  Result Date: 06/30/2019 Lora Havens, MD     07/01/2019  8:50 AM Patient Name: Meshia Rau MRN: [id number] Epilepsy Attending: Lora Havens Referring Physician/Provider:  Dr. Kara Mead Date: 06/30/2019 Duration: 24.07 minutes  Patient history: 69 year old female presented with seizure-like episode and was found to have left frontal lobe hemorrhagic  mass.  EEG evaluate for seizures.  Level of alertness:  Awake  AEDs during EEG study: Keppra  Technical aspects: This EEG study was done with scalp electrodes positioned according to the 10-20 International system of electrode placement. Electrical activity was acquired at a sampling rate of 500Hz  and reviewed with a high frequency filter of 70Hz  and a low frequency filter of 1Hz . EEG data were recorded continuously and digitally stored.  Description:  No clear posterior dominant rhythm was seen.  EEG showed continuous generalized polymorphic 5 to 9 Hz theta-alpha activity.  Hyperventilation and photic stimulation were not performed. Of note, EEG was technically difficult due to significant eye flutter artifact.  Abnormality -Continuous slow, generalized  IMPRESSION: This technically difficult study suggestive of moderate diffuse encephalopathy, nonspecific etiology. No seizures or definite epileptiform discharges were seen throughout the recording. Priyanka [name] [name]   Overnight EEG with video  Result Date: 07/01/2019 Lora Havens, MD     07/02/2019  9:21 AM Patient Name:Janira Paulick MAU:[id number] Epilepsy Attending:Priyanka [name] [name] Referring Physician/Provider: Dr. Emelda Brothers Duration:06/30/2019 [phone] to 07/01/2019 1223  Patient history:69 year old female presented with seizure-like episode and was found to have left frontal lobe hemorrhagic mass. EEG evaluate for seizures.  Level of alertness: awake, asleep  AEDs during EEG study:Keppra  Technical aspects: This EEG study was done with scalp electrodes positioned  according to the 10-20 International system of electrode placement. Electrical activity was acquired at a sampling rate of 500Hz  and reviewed with a high frequency filter of 70Hz  and a low frequency filter of 1Hz . EEG data were recorded continuously and digitally stored.  Description: No clear posterior dominant rhythm was seen. Sleep was characterized by vertex waves, sleep  spindles (12-14hz ), maximal frontocentral region. EEG showed continuous generalized and lateralized left frontotemporal region  3-6 Hz theta-delta slowing.  Hyperventilation and photic stimulation were not performed.  Abnormality -Continuous slow, generalized and lateralized left frontotemporal region  IMPRESSION: This study suggestive of cortical dysfunction in left frontotemporal region consistent with underlying structural abnormality. Additionally, there is evidence of moderate diffuse encephalopathy, nonspecific etiology.No seizures ordefiniteepileptiform discharges were seen throughout the recording.  Lora Havens   US THYROID  Result Date: 06/28/2019 CLINICAL DATA:  Incidental on CT. Nodule of thyroid isthmus by CT of the chest. EXAM: THYROID ULTRASOUND TECHNIQUE: Ultrasound examination of the thyroid gland and adjacent soft tissues was performed. COMPARISON:  CT of the chest on 06/27/2019 FINDINGS: Parenchymal Echotexture: Mildly heterogenous Isthmus: 1.0 cm Right lobe: 4.4 x 1.7 x 1.2 cm Left lobe: 4.3 x 1.9 x 1.3 cm _________________________________________________________ Estimated total number of nodules >/= 1 cm: 1 Number of spongiform nodules >/=  2 cm not described below (TR1): 0 Number of mixed cystic and solid nodules >/= 1.5 cm not described below (TR2): 0 _________________________________________________________ Nodule # 1: Location: Isthmus Maximum size: 1.6 cm; Other 2 dimensions: 1.4 x 1.1 cm Composition: solid/almost completely solid (2) Echogenicity: hypoechoic (2) Shape: not taller-than-wide (0) Margins: ill-defined (0) Echogenic foci: none (0) ACR TI-RADS total points: 4. ACR TI-RADS risk category: TR4 (4-6 points). ACR TI-RADS recommendations: **Given size (>/= 1.5 cm) and appearance, fine needle aspiration of this moderately suspicious nodule should be considered based on TI-RADS criteria. _________________________________________________________ Simple cyst in the inferior  left lobe measures 0.9 cm in greatest diameter and has a benign appearance. No abnormal lymph nodes identified IMPRESSION: 1.6 cm isthmus nodule technically meets criteria for fine-needle aspiration. The nodule is borderline in size for biopsy and 1 year follow-up ultrasound could also be considered. The above is in keeping with the ACR TI-RADS recommendations - J Am Coll Radiol 2017;14:587-595. Electronically Signed   By: Aletta Edouard M.D.   On: 06/28/2019 15:41    Microbiology: No results found for this or any previous visit (from the past 240 hour(s)).   Labs: Basic Metabolic Panel: Recent Labs  Lab 07/01/19 0554 07/02/19 0619 07/03/19 0351 07/06/19 0702 07/07/19 0644  NA 138 137 139 139 138  K 3.4* 3.8 3.9 4.1 4.0  CL 103 101 104 100 102  CO2 23 25 27 30 26   GLUCOSE 124* 147* 165* 123* 140*  BUN 17 21 25* 20 24*  CREATININE 0.64 0.70 0.65 0.71 0.75  CALCIUM 8.8* 9.2 8.9 9.2 9.5  MG 2.0 2.0  --   --   --   PHOS 3.0 3.3  --   --   --    Liver Function Tests: No results for input(s): AST, ALT, ALKPHOS, BILITOT, PROT, ALBUMIN in the last 168 hours. No results for input(s): LIPASE, AMYLASE in the last 168 hours. No results for input(s): AMMONIA in the last 168 hours. CBC: Recent Labs  Lab 07/02/19 0619 07/03/19 0351 07/04/19 0323 07/06/19 0702 07/07/19 0644  WBC 14.5* 14.0* 13.1* 12.7* 12.5*  NEUTROABS  --   --   --  9.2* 8.8*  HGB 10.8*  10.7* 10.7* 12.2 12.1  HCT 33.0* 32.5* 32.5* 37.9 37.6  MCV 93.0 93.4 93.4 95.0 94.7  PLT 189 221 249 310 305   Cardiac Enzymes: No results for input(s): CKTOTAL, CKMB, CKMBINDEX, TROPONINI in the last 168 hours. BNP: BNP (last 3 results) No results for input(s): BNP in the last 8760 hours.  ProBNP (last 3 results) No results for input(s): PROBNP in the last 8760 hours.  CBG: Recent Labs  Lab 07/01/19 2347 07/02/19 0328 07/02/19 0729 07/02/19 1213 07/02/19 1705  GLUCAP 118* 141* 140* 130* 150*        Signed:  Kayleen Memos, MD [facility] 07/07/2019, 12:34 PM

## 2019-07-07 NOTE — Progress Notes (Signed)
Neurosurgery Service Progress Note  Subjective: NAE ON, no events overnight  Objective: Vitals:   07/06/19 1548 07/06/19 1956 07/06/19 2344 07/07/19 0816  BP: 110/66 113/74 110/70 (!) 108/98  Pulse: (!) 107 (!) 105 (!) 107 (!) 115  Resp: 18 19 18 16   Temp: 99.7 F (37.6 C) 99.7 F (37.6 C) 98.6 F (37 C) 99.2 F (37.3 C)  TempSrc: Oral Oral Oral Oral  SpO2: 97% 97% 97% 98%  Weight:      Height:       Temp (24hrs), Avg:99.2 F (37.3 C), Min:98.6 F (37 C), Max:99.7 F (37.6 C)  CBC Latest Ref Rng & Units 07/07/2019 07/06/2019 07/04/2019  WBC 4.0 - 10.5 K/uL 12.5(H) 12.7(H) 13.1(H)  Hemoglobin 12.0 - 15.0 g/dL 12.1 12.2 10.7(L)  Hematocrit 36.0 - 46.0 % 37.6 37.9 32.5(L)  Platelets 150 - 400 K/uL 305 310 249   BMP Latest Ref Rng & Units 07/07/2019 07/06/2019 07/03/2019  Glucose 70 - 99 mg/dL 140(H) 123(H) 165(H)  BUN 8 - 23 mg/dL 24(H) 20 25(H)  Creatinine 0.44 - 1.00 mg/dL 0.75 0.71 0.65  Sodium 135 - 145 mmol/L 138 139 139  Potassium 3.5 - 5.1 mmol/L 4.0 4.1 3.9  Chloride 98 - 111 mmol/L 102 100 104  CO2 22 - 32 mmol/L 26 30 27   Calcium 8.9 - 10.3 mg/dL 9.5 9.2 8.9    Intake/Output Summary (Last 24 hours) at 07/07/2019 5176 Last data filed at 07/07/2019 0800 Gross per 24 hour  Intake 480 ml  Output 700 ml  Net -220 ml    Current Facility-Administered Medications:  .  acetaminophen (TYLENOL) tablet 650 mg, 650 mg, Oral, Q4H PRN, Judith Part, MD, 650 mg at 07/06/19 1812 .  docusate (COLACE) 50 MG/5ML liquid 100 mg, 100 mg, Oral, BID, Erick Colace, NP, 100 mg at 07/06/19 2100 .  enoxaparin (LOVENOX) injection 40 mg, 40 mg, Subcutaneous, Q24H, Hall, Carole N, DO, 40 mg at 07/06/19 2100 .  feeding supplement (ENSURE ENLIVE) (ENSURE ENLIVE) liquid 237 mL, 237 mL, Oral, TID BM, Hall, Carole N, DO, 237 mL at 07/06/19 2000 .  feeding supplement (PRO-STAT SUGAR FREE 64) liquid 30 mL, 30 mL, Oral, BID, Hall, Carole N, DO, 30 mL at 07/06/19 2100 .  fluconazole  (DIFLUCAN) tablet 100 mg, 100 mg, Oral, Daily, Hall, Carole N, DO .  levETIRAcetam (KEPPRA) 100 MG/ML solution 1,000 mg, 1,000 mg, Oral, BID, Hall, Carole N, DO, 1,000 mg at 07/06/19 2100 .  LORazepam (ATIVAN) injection 2 mg, 2 mg, Intravenous, Q5 min PRN, Erick Colace, NP, 2 mg at 07/02/19 2356 .  magic mouthwash w/lidocaine, 5 mL, Oral, TID PRN, Irene Pap N, DO .  menthol-cetylpyridinium (CEPACOL) lozenge 3 mg, 1 lozenge, Oral, PRN, Judith Part, MD, 3 mg at 07/04/19 1843 .  multivitamin with minerals tablet 1 tablet, 1 tablet, Oral, Daily, Irene Pap N, DO, 1 tablet at 07/06/19 1014 .  nystatin (MYCOSTATIN) 100000 UNIT/ML suspension 500,000 Units, 5 mL, Oral, QID, Kayleen Memos, DO, 500,000 Units at 07/06/19 2100 .  ondansetron (ZOFRAN) injection 4 mg, 4 mg, Intravenous, Q6H PRN, Salvadore Dom E, NP .  polyethylene glycol (MIRALAX / GLYCOLAX) packet 17 g, 17 g, Per Tube, Daily PRN, Erick Colace, NP, 17 g at 07/04/19 1607   Physical Exam: Awake/alert, occasionally speaking a few words at a time to family, comprehension intact, R side 5/5, L 5/5 Incision c/d/i  Assessment & Plan: 69 y.o. woman p/w seizures, MRI brain with  left frontal 23x41mm hemorrhagic mass, CT CAP with pulmonary nodule with some growth since 2020 CT. 5/6 s/p crani for rsxn, 5/7 post-op MRI GTR  -continue keppra, off dex -will discuss post-op SRS at tumor board next week -will be off this weekend, my partners will be available for any concerns or questions, will see the patient on Monday if she is still inpatient  Judith Part  07/07/19 8:22 AM

## 2019-07-07 NOTE — Progress Notes (Signed)
   07/07/19 0905  Vitals  Temp 98.5 F (36.9 C)  Temp Source Oral  BP 116/62  MAP (mmHg) 79  BP Method Automatic  Pulse Rate (!) 110  Pulse Rate Source Monitor  Resp 18  Level of Consciousness  Level of Consciousness Alert  Oxygen Therapy  SpO2 98 %  O2 Device Room Air  Patient Activity (if Appropriate) In bed  Pain Assessment  Pain Scale 0-10  Pain Score 0  MEWS Score  MEWS Temp 0  MEWS Systolic 0  MEWS Pulse 1  MEWS RR 0  MEWS LOC 0  MEWS Score 1  MEWS Score Color Green

## 2019-07-07 NOTE — Progress Notes (Signed)
Patient arrived with her husband from [address] [facility], assigned to [facility]. Appears alert with no complaint of p[ain.

## 2019-07-07 NOTE — TOC Transition Note (Signed)
Transition of Care [facility]) - CM/SW Discharge Note   Patient Details  Name: Sara Schroeder MRN: [id number] Date of Birth: Nov 13, 1950  [facility] CM/SW Contact:  Pollie Friar, RN Phone Number: 07/07/2019, 12:14 PM   Clinical Narrative:    Pt discharging to CIR today. CM signing off.   Final next level of care: IP Rehab Facility Barriers to Discharge: No Barriers Identified   Patient Goals and CMS Choice        Discharge Placement                       Discharge Plan and Services                                     Social Determinants of Health (SDOH) Interventions     Readmission Risk Interventions No flowsheet data found.

## 2019-07-07 NOTE — Progress Notes (Signed)
Sara Ribas, MD  Physician  Physical Medicine and Rehabilitation  Consult Note      Signed  Date of Service:  07/03/2019  6:22 AM      Related encounter: Admission (Current) from 06/27/2019 in [facility]      Signed      Expand AllCollapse All   Show:Clear all [x] Manual[x] Template[] Copied  Added by: [x] Schroeder, Sara Paganini, PA-C[x] Raulkar, Clide Deutscher, MD  [] Hover for details          Physical Medicine and Rehabilitation Consult Reason for Consult: Decreased functional mobility seizure and aphasia Referring Physician: [facility]     HPI: Sara Schroeder is a 69 y.o. right-handed female with history of anxiety and hypertension as well as history of pulmonary nodule January 2020.  Presented 06/27/2019 with reported seizure and aphasia.  Per chart review independent prior to admission living with spouse and family.  Two-level home bed and bath on main level.  Cranial CT scan showed left frontal lobe hemorrhagic mass with surrounding vasogenic edema and a 2 mm of rightward midline shift.  MRI solitary 2.6 cm left lateral frontal mass compatible with hemorrhagic neoplasm.  CT of chest abdomen pelvis showed spiculated right middle lobe pulmonary nodule measuring 11 x 11 x 10 mm.  No other evidence of metastatic disease.  Patient underwent left frontal craniotomy tumor resection 06/29/2019 per Dr. Venetia Constable.  She was extubated 06/30/2019.  EEG 06/30/2019 showed no seizure activity and currently maintained on Keppra for seizure prophylaxis.  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  Decadron protocol as indicated.  Tolerating a dysphagia #1 thin liquid diet.     Review of Systems  Unable to perform ROS: Language        Past Medical History:  Diagnosis Date  . Anxiety    . Hypertension           Past Surgical History:  Procedure Laterality Date  . APPLICATION OF CRANIAL NAVIGATION N/A 06/29/2019    Procedure:  APPLICATION OF CRANIAL NAVIGATION;  Surgeon: Judith Part, MD;  Location: [facility];  Service: Neurosurgery;  Laterality: N/A;  . CRANIOTOMY Left 06/29/2019    Procedure: LEFT CRANIOTOMY FOR TUMOR EXCISION;  Surgeon: Judith Part, MD;  Location: [facility];  Service: Neurosurgery;  Laterality: Left;    History reviewed. No pertinent family history. Social History:  reports that she has never smoked. She has never used smokeless tobacco. She reports that she does not drink alcohol or use drugs. Allergies:       Allergies  Allergen Reactions  . Lisinopril Other (See Comments)      Per med list from [facility]           Medications Prior to Admission  Medication Sig Dispense Refill  . dicyclomine (BENTYL) 20 MG tablet Take 20 mg by mouth 4 (four) times daily as needed for spasms (abdominal pain).      Marland Kitchen FLUoxetine (PROZAC) 20 MG capsule Take 20 mg by mouth daily.      . hydrochlorothiazide (HYDRODIURIL) 25 MG tablet Take 25 mg by mouth daily.      Marland Kitchen LORazepam (ATIVAN) 0.5 MG tablet Take 0.25 mg by mouth at bedtime as needed for anxiety.      . melatonin 1 MG TABS tablet Take 1-3 mg by mouth at bedtime as needed (sleep).      . methylcellulose (CITRUCEL) oral powder Take 1 packet by mouth daily. Mix with  8oz of water or juice      . omeprazole (PRILOSEC) 20 MG capsule Take 20 mg by mouth daily.      . pravastatin (PRAVACHOL) 20 MG tablet Take 20 mg by mouth every evening.      . propranolol (INDERAL) 10 MG tablet Take 10 mg by mouth 2 (two) times daily.      . traZODone (DESYREL) 50 MG tablet Take 100 mg by mouth at bedtime.          Home: Home Living Family/patient expects to be discharged to:: Private residence Living Arrangements: Spouse/significant other Available Help at Discharge: Family, Available 24 hours/day Type of Home: House Home Access: Level entry Home Layout: Two level, Able to live on main level with bedroom/bathroom Alternate Level  Stairs-Number of Steps: flight Bathroom Shower/Tub: Optometrist: Yes Home Equipment: None  Lives With: Spouse(sons, granddaughter, daughter, and son-in-law live close by)  Functional History: Prior Function Level of Independence: Independent Comments: pt walks around neighborhood with spouse often. Per son pt takes bird baths Functional Status:  Mobility: Bed Mobility Overal bed mobility: Needs Assistance Bed Mobility: Supine to Sit Supine to sit: Max assist General bed mobility comments: pt with poor initiation and ability to follow commands to mobilize to edge of bed Transfers Overall transfer level: Needs assistance Equipment used: 1 person hand held assist Transfers: Sit to/from Stand, Stand Pivot Transfers Sit to Stand: Mod assist, From elevated surface Stand pivot transfers: Mod assist, From elevated surface General transfer comment: pt requires initiation to start stand. Once standing pt requries minA to maintain standing balance, but then requires PT physical assistance to direct and facilitate turn toward recliner   ADL:   Cognition: Cognition Overall Cognitive Status: Difficult to assess Orientation Level: Other (comment) Cognition Arousal/Alertness: Awake/alert Behavior During Therapy: Restless(in recliner at end of session) Overall Cognitive Status: Difficult to assess General Comments: pt unable to verbalize during session, sometimes produces low pitched hums but otherwise no sound production. Pt follows ~25-50% of verbal motor commands, does better with PT initiation of task for pt to follow through. May have some apraxia present as pt initially appears to understand command to sit at edge of bed but then only moves legs short distance back and forth Difficult to assess due to: Impaired communication, Non-English speaking   Blood pressure 116/66, pulse 84, temperature 98.2 F (36.8 C), temperature source Oral,  resp. rate 18, height 5\' 2"  (1.575 m), weight 61.6 kg, SpO2 97 %.   Physical Exam  General: Alert and oriented x 3, No apparent distress HEENT: Head is normocephalic, atraumatic, PERRLA, EOMI, sclera anicteric, oral mucosa pink and moist, dentition intact, ext ear canals clear,  Neck: Supple without JVD or lymphadenopathy Heart: Reg rate and rhythm. No murmurs rubs or gallops Chest: CTA bilaterally without wheezes, rales, or rhonchi; no distress Abdomen: Soft, non-tender, non-distended, bowel sounds positive. Extremities: No clubbing, cyanosis, or edema. Pulses are 2+ Skin: Cranial site clean and dry.  Neuro/MSK: Lethargic but arousable.  Non-English-speaking.  Follows some simple demonstrated commands.  Examination overall limited. 5/5 throughout right side and 4/5 throughout left side Psych: Pt's affect is appropriate. Pt is cooperative     Lab Results Last 24 Hours       Results for orders placed or performed during the hospital encounter of 06/27/19 (from the past 24 hour(s))  Glucose, capillary     Status: Abnormal    Collection Time: 07/02/19  7:29 AM  Result Value  Ref Range    Glucose-Capillary 140 (H) 70 - 99 mg/dL  Glucose, capillary     Status: Abnormal    Collection Time: 07/02/19 12:13 PM  Result Value Ref Range    Glucose-Capillary 130 (H) 70 - 99 mg/dL  Glucose, capillary     Status: Abnormal    Collection Time: 07/02/19  5:05 PM  Result Value Ref Range    Glucose-Capillary 150 (H) 70 - 99 mg/dL  CBC     Status: Abnormal    Collection Time: 07/03/19  3:51 AM  Result Value Ref Range    WBC 14.0 (H) 4.0 - 10.5 K/uL    RBC 3.48 (L) 3.87 - 5.11 MIL/uL    Hemoglobin 10.7 (L) 12.0 - 15.0 g/dL    HCT 32.5 (L) 36.0 - 46.0 %    MCV 93.4 80.0 - 100.0 fL    MCH 30.7 26.0 - 34.0 pg    MCHC 32.9 30.0 - 36.0 g/dL    RDW 13.2 11.5 - 15.5 %    Platelets 221 150 - 400 K/uL    nRBC 0.0 0.0 - 0.2 %  Basic metabolic panel Once     Status: Abnormal    Collection Time: 07/03/19   3:51 AM  Result Value Ref Range    Sodium 139 135 - 145 mmol/L    Potassium 3.9 3.5 - 5.1 mmol/L    Chloride 104 98 - 111 mmol/L    CO2 27 22 - 32 mmol/L    Glucose, Bld 165 (H) 70 - 99 mg/dL    BUN 25 (H) 8 - 23 mg/dL    Creatinine, Ser 0.65 0.44 - 1.00 mg/dL    Calcium 8.9 8.9 - 10.3 mg/dL    GFR calc non Af Amer >60 >60 mL/min    GFR calc Af Amer >60 >60 mL/min    Anion gap 8 5 - 15       Imaging Results (Last 48 hours)  Overnight EEG with video   Result Date: 07/01/2019 Sara Havens, MD     07/02/2019  9:21 AM Patient Name: Sara Schroeder  MRN: [id number]  Epilepsy Attending: Lora Schroeder  Referring Physician/Provider:  Dr. Emelda Brothers Duration: 06/30/2019 0950 to 07/01/2019 1223   Patient history: 69 year old female presented with seizure-like episode and was found to have left frontal lobe hemorrhagic mass.  EEG evaluate for seizures.   Level of alertness: awake, asleep   AEDs during EEG study: Keppra   Technical aspects: This EEG study was done with scalp electrodes positioned according to the 10-20 International system of electrode placement. Electrical activity was acquired at a sampling rate of 500Hz  and reviewed with a high frequency filter of 70Hz  and a low frequency filter of 1Hz . EEG data were recorded continuously and digitally stored.    Description:  No clear posterior dominant rhythm was seen. Sleep was characterized by vertex waves, sleep spindles (12-14hz ), maximal frontocentral region. EEG showed continuous generalized and lateralized left frontotemporal region  3-6 Hz theta-delta slowing.  Hyperventilation and photic stimulation were not performed.   Abnormality -Continuous slow, generalized and lateralized left frontotemporal region   IMPRESSION: This study suggestive of cortical dysfunction in left frontotemporal region consistent with underlying structural abnormality. Additionally, there is evidence of moderate diffuse encephalopathy, nonspecific etiology. No seizures or  definite epileptiform discharges were seen throughout the recording.   Sara Schroeder         Assessment/Plan: Diagnosis: Left frontal hemorrhagic mass s/p crani, prelim + for  metastatic carcinoma 1. Does the need for close, 24 hr/day medical supervision in concert with the patient's rehab needs make it unreasonable for this patient to be served in a less intensive setting? Yes 2. Co-Morbidities requiring supervision/potential complications: seizures, nausea, post-operative pain, impaired mobility and ADLs, impaired cognition, impaired speech 3. Due to bladder management, bowel management, safety, skin/wound care, disease management, medication administration, pain management and patient education, does the patient require 24 hr/day rehab nursing? Yes 4. Does the patient require coordinated care of a physician, rehab nurse, therapy disciplines of PT, OT, SLP to address physical and functional deficits in the context of the above medical diagnosis(es)? Yes Addressing deficits in the following areas: balance, endurance, locomotion, strength, transferring, bowel/bladder control, bathing, dressing, feeding, grooming, toileting, cognition, speech, language and psychosocial support 5. Can the patient actively participate in an intensive therapy program of at least 3 hrs of therapy per day at least 5 days per week? Yes 6. The potential for patient to make measurable gains while on inpatient rehab is excellent 7. Anticipated functional outcomes upon discharge from inpatient rehab are min assist  with PT, min assist with OT, min assist with SLP. 8. Estimated rehab length of stay to reach the above functional goals is: 14-16 days 9. Anticipated discharge destination: Home 10. Overall Rehab/Functional Prognosis: excellent   RECOMMENDATIONS: This patient's condition is appropriate for continued rehabilitative care in the following setting: CIR Patient has agreed to participate in recommended program.  Yes Note that insurance prior authorization may be required for reimbursement for recommended care.   Comment: Mrs. Mulhall would be an excellent CIR candidate. She will have the support of several family members upon discharge home. We will continue to follow in Mrs. Dorr's care. Thank you for this consult.    Sara Paganini Angiulli, PA-C 07/03/2019    I have personally performed a face to face diagnostic evaluation, including, but not limited to relevant history and physical exam findings, of this patient and developed relevant assessment and plan.  Additionally, I have reviewed and concur with the physician assistant's documentation above.   Leeroy Cha, MD        Revision History                     Routing History

## 2019-07-07 NOTE — Progress Notes (Signed)
Jamse Arn, MD  Physician  Physical Medicine and Rehabilitation  PMR Pre-admission      Addendum  Date of Service:  07/03/2019  4:35 PM      Related encounter: Admission (Current) from 06/27/2019 in [place]        Show:Clear all [x] Manual[x] Template[x] Copied  Added by: [x] Cristina Gong, RN[x] Lind Covert, Lauren P, CCC-SLP[x] Patel, Ankit [place], MD[x] Michel Santee, PT  [] Hover for details PMR Admission Coordinator Pre-Admission Assessment   Patient: Sara Schroeder is an 69 y.o., female MRN: [id number] DOB: 1951-01-16 Height: 5\' 2"  (157.5 cm) Weight: 61.6 kg                                                                                                                                                  Insurance Information HMO:     PPO:      PCP:      IPA:      80/20:      OTHER:  PRIMARY: Medicaid of [place]      Policy#: [id number]      Subscriber: patient CM Name:       Phone#:      Fax#:  Pre-Cert#: MAA coverage code      Employer:  Benefits:  Phone #:      Name:  Eff. Date: active as of 07/05/19     Deduct:       Out of Pocket Max:       Life Max:   CIR:       SNF:  Outpatient:      Co-Pay:  Home Health:       Co-Pay:  DME:      Co-Pay:  Providers:  SECONDARY:       Policy#:       Phone#:    Development worker, community:       Phone#:    The Engineer, petroleum" for patients in Inpatient Rehabilitation Facilities with attached "Privacy Act [place] Records" was provided and verbally reviewed with: N/A   Emergency Contact Information Contact Information       Name Relation Home Work Mobile    Day, Day Son     [phone]    Spero Geralds     [phone]    Shwe, Nord    Bing Neighbors     [phone]         Current Medical History  Patient Admitting Diagnosis: Left frontal hemorrhagic mass s/p crani, 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  Presented 06/27/2019 with reported seizure and aphasia.   Cranial CT scan showed left frontal lobe hemorrhagic mass with surrounding vasogenic edema  and a 2 mm rightward midline shift.  MRI solitary 2.6 cm left lateral frontal mass compatible with hemorrhagic neoplasm.  CT of the chest abdomen pelvis showed spiculated right middle lobe pulmonary nodule measuring 11 x 11 x 10 mm.  No other evidence of metastatic disease.  Patient underwent left frontal craniotomy tumor resection 06/29/2019 per Dr. Venetia Constable.  She was extubated 06/30/2019.  EEG showed no seizure activity maintained on Keppra for seizure prophylaxis.  Oncology services Dr. 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  Radiation oncology has also been consulted for planned radiation therapy to the brain.  Plan PET scan as outpatient.  Decadron protocol as indicated and course has been completed.  Tolerating a dysphagia #1 thin liquid diet.  Subcutaneous heparin was initiated for DVT prophylaxis 07/03/2019.     Past Medical History      Past Medical History:  Diagnosis Date  . Anxiety    . Hypertension        Family History  family history is not on file.   Prior Rehab/Hospitalizations:  Has the patient had prior rehab or hospitalizations prior to admission? No   Has the patient had major surgery during 100 days prior to admission? Yes   Current Medications    Current Facility-Administered Medications:  .  acetaminophen (TYLENOL) tablet 650 mg, 650 mg, Oral, Q4H PRN, Judith Part, MD, 650 mg at 07/06/19 1812 .  docusate (COLACE) 50 MG/5ML liquid 100 mg, 100 mg, Oral, BID, Erick Colace, NP, 100 mg at 07/07/19 0855 .  enoxaparin (LOVENOX) injection 40 mg, 40 mg, Subcutaneous, Q24H, Hall, Carole N, DO, 40 mg at 07/06/19 2100 .  feeding  supplement (ENSURE ENLIVE) (ENSURE ENLIVE) liquid 237 mL, 237 mL, Oral, TID BM, Hall, Carole N, DO, 237 mL at 07/07/19 0902 .  feeding supplement (PRO-STAT SUGAR FREE 64) liquid 30 mL, 30 mL, Oral, BID, Hall, Carole N, DO, 30 mL at 07/07/19 0855 .  fluconazole (DIFLUCAN) tablet 100 mg, 100 mg, Oral, Daily, Hall, Carole N, DO, 100 mg at 07/07/19 0901 .  levETIRAcetam (KEPPRA) 100 MG/ML solution 1,000 mg, 1,000 mg, Oral, BID, Irene Pap N, DO, 1,000 mg at 07/07/19 0855 .  LORazepam (ATIVAN) injection 2 mg, 2 mg, Intravenous, Q5 min PRN, Erick Colace, NP, 2 mg at 07/02/19 2356 .  magic mouthwash w/lidocaine, 5 mL, Oral, TID PRN, Irene Pap N, DO .  menthol-cetylpyridinium (CEPACOL) lozenge 3 mg, 1 lozenge, Oral, PRN, Judith Part, MD, 3 mg at 07/04/19 1843 .  multivitamin with minerals tablet 1 tablet, 1 tablet, Oral, Daily, Irene Pap N, DO, 1 tablet at 07/07/19 0855 .  nystatin (MYCOSTATIN) 100000 UNIT/ML suspension 500,000 Units, 5 mL, Oral, QID, Kayleen Memos, DO, 500,000 Units at 07/07/19 0855 .  ondansetron (ZOFRAN) injection 4 mg, 4 mg, Intravenous, Q6H PRN, Salvadore Dom E, NP .  polyethylene glycol (MIRALAX / GLYCOLAX) packet 17 g, 17 g, Per Tube, Daily PRN, Erick Colace, NP, 17 g at 07/04/19 0855   Patients Current Diet:  Diet Order                  DIET - DYS 1 Room service appropriate? Yes with Assist; Fluid consistency: Thin  Diet effective now                  Meds crushed in pureed   Precautions / Restrictions Precautions Precautions: Fall Precaution Comments: impaired vision Restrictions  Weight Bearing Restrictions: No    Has the patient had 2 or more falls or a fall with injury in the past year?No   Prior Activity Level Community (5-7x/wk): ~3x/week if feeling well per son   Prior Functional Level Prior Function Level of Independence: Independent Comments: pt walks around neighborhood with spouse often. Per son pt takes bird baths   Self  Care: Did the patient need help bathing, dressing, using the toilet or eating?  Independent   Indoor Mobility: Did the patient need assistance with walking from room to room (with or without device)? Independent   Stairs: Did the patient need assistance with internal or external stairs (with or without device)? Independent   Functional Cognition: Did the patient need help planning regular tasks such as shopping or remembering to take medications? Independent   Home Assistive Devices / Equipment Home Assistive Devices/Equipment: None Home Equipment: None   Prior Device Use: Indicate devices/aids used by the patient prior to current illness, exacerbation or injury? None of the above   Current Functional Level Cognition   Arousal/Alertness: Awake/alert Overall Cognitive Status: Impaired/Different from baseline Difficult to assess due to: Non-English speaking, Impaired communication Current Attention Level: Sustained Orientation Level: Oriented to person, Other (comment) Following Commands: Follows one step commands with increased time, Follows one step commands inconsistently Safety/Judgement: Decreased awareness of safety, Decreased awareness of deficits General Comments: pt with slow processing and at times not responding to commands or questions; pt able to complete mobility tasks when initiated by therapists Attention: Focused Behaviors: Restless, Impulsive    Extremity Assessment (includes Sensation/Coordination)   Upper Extremity Assessment: RUE deficits/detail RUE Deficits / Details: Pt demonstrates strength grossly 3-/5.    RUE Coordination: decreased gross motor, decreased fine motor  Lower Extremity Assessment: Defer to PT evaluation     ADLs   Overall ADL's : Needs assistance/impaired Eating/Feeding: Set up, Minimal assistance, Sitting Eating/Feeding Details (indicate cue type and reason): assist to open containers. Able to load spoon and bring food to mouth with no  physical assist. suspect needing assist for full meal consumption 2/2 fatigue. Grooming: Oral care, Maximal assistance, Standing Grooming Details (indicate cue type and reason): Pt able to identiy toothbrush, but unable to sequence toothpaste or wetting of toothbrush without cues to complete in appropriate order Upper Body Bathing: Maximal assistance, Sitting Lower Body Bathing: Maximal assistance, Sit to/from stand Upper Body Dressing : Maximal assistance, Sitting Lower Body Dressing: Sitting/lateral leans, Minimal assistance, Min guard Lower Body Dressing Details (indicate cue type and reason): Donning socks, increased cues to adjust sock on R foot, increased prompting to initiate task, socks had to be placed in pt's hands to initiate Toilet Transfer: Moderate assistance, +2 for safety/equipment, Stand-pivot, Minimal assistance Toilet Transfer Details (indicate cue type and reason): HHA with PT simulated to recliner Toileting- Clothing Manipulation and Hygiene: Maximal assistance, Sit to/from stand Functional mobility during ADLs: +2 for physical assistance, +2 for safety/equipment, Moderate assistance General ADL Comments: HHA of 2 to walk, would not ambulate without 2 person assist, (would grip onto objects) decreased reports of dizziness, however continued deficits in vision which is difficult to determine due to language deficit     Mobility   Overal bed mobility: Needs Assistance Bed Mobility: Supine to Sit Supine to sit: Min guard, HOB elevated General bed mobility comments: pt able to long sit in bed; cues for sequencing to get to EOB     Transfers   Overall transfer level: Needs assistance Equipment used: Rolling walker (2  wheeled) Transfers: Sit to/from Stand Sit to Stand: Min assist Stand pivot transfers: Mod assist General transfer comment: pt stood X 2 from EOB with min A to power up and for balance upon standing (2 person hand held assist use to progress mobility)       Ambulation / Gait / Stairs / Wheelchair Mobility   Ambulation/Gait Ambulation/Gait assistance: Min assist, +2 physical assistance, +2 safety/equipment Gait Distance (Feet): (~60 ft) Assistive device: 2 person hand held assist Gait Pattern/deviations: Step-through pattern, Decreased stride length, Shuffle, Trunk flexed, Narrow base of support General Gait Details: assist for balance; cues for increased bilat step lengths; no change in gait noted with cues Gait velocity: very slow not attempted due to dizziness    Posture / Balance Dynamic Sitting Balance Sitting balance - Comments: able to maintain EOB sitting with min gaurd assist  Balance Overall balance assessment: Needs assistance Sitting-balance support: No upper extremity supported, Feet unsupported Sitting balance-Leahy Scale: Fair Sitting balance - Comments: able to maintain EOB sitting with min gaurd assist  Standing balance support: Single extremity supported Standing balance-Leahy Scale: Poor Standing balance comment: able to static stand without UE support; without 2 person HHA pt would grip onto objects in environment and require increased encouragement to ambulate     Special needs/care consideration Skin surgical incision  Bowel and bladder incontinence  Designated visitors: Family have been staying with her 24/7 in hospital due to language barrier. Speaks "Santiago Glad". Son to clarify specific members Julieta Bellini to be arranged if possible for therapy. [place] does have dialect    Previous Home Environment  Living Arrangements: Spouse/significant other  Lives With: Spouse Available Help at Discharge: Family, Available 24 hours/day Type of Home: House Home Layout: Two level, Able to live on main level with bedroom/bathroom Alternate Level Stairs-Number of Steps: flight Home Access: Level entry Bathroom Shower/Tub: Chiropodist: Standard Bathroom Accessibility: Yes How Accessible: Accessible via  walker [place]: No   Discharge Living Setting Plans for Discharge Living Setting: Patient's home Type of Home at Discharge: House Discharge Home Layout: Two level, Able to live on main level with bedroom/bathroom Discharge Home Access: Level entry Discharge Bathroom Shower/Tub: Tub/shower unit Discharge Bathroom Toilet: Standard Discharge Bathroom Accessibility: Yes How Accessible: Accessible via walker Does the patient have any problems obtaining your medications?: No   Social/Family/Support Systems Patient Roles: Spouse, Parent Anticipated Caregiver: Pricilla Holm, son Anticipated Caregiver's Contact Information: [phone] Ability/Limitations of Caregiver: n/a Caregiver Availability: 24/7 Discharge Plan Discussed with Primary Caregiver: Yes Is Caregiver In Agreement with Plan?: Yes Does Caregiver/Family have Issues with Lodging/Transportation while Pt is in Rehab?: No   Goals Patient/Family Goal for Rehab: PT/OT/SLP supervision to mod I Expected length of stay: 7 -10 days Cultural Considerations: Santiago Glad dialect Additional Information: needs interpreter Pt/Family Agrees to Admission and willing to participate: Yes Program Orientation Provided & Reviewed with Pt/Caregiver Including Roles  & Responsibilities: Yes  Barriers to Discharge: Insurance for SNF coverage   Decrease burden of Care through IP rehab admission: N/a   Possible need for SNF placement upon discharge: Not anticipated   Patient Condition: This patient's medical and functional status has changed since the consult dated: 07/03/2019 in which the Rehabilitation Physician determined and documented that the patient's condition is appropriate for intensive rehabilitative care in an inpatient rehabilitation facility. See "History of Present Illness" (above) for medical update. Functional changes are: overall min assist. Patient's medical and functional status update has been discussed with the Rehabilitation  physician and  patient remains appropriate for inpatient rehabilitation. Will admit to inpatient rehab today.   Preadmission Screen Completed By:  Bethel Born, CCC-SLP, with brief updates by Danne Baxter RN MSN 07/07/2019 10:28 AM ______________________________________________________________________   Discussed status with Dr. Posey Pronto on 07/07/2019 at 1028 and received approval for admission today.   Admission Coordinator: Gayland Curry SLP with updates by  Cleatrice Burke, time 1028 Date 07/07/2019         Revision History

## 2019-07-07 NOTE — H&P (Signed)
Physical Medicine and Rehabilitation Admission H&P     HPI: Sara Schroeder is a 69 year old right-handed female with history of anxiety and hypertension as well as history of pulmonary nodule January 2020.  History taken from chart review and sign due to cognition and language.  She presented on 06/27/2019 with seizures and aphasia.  Prior to admission patient was living with spouse and family.  Two-level home bed and bath on main level.  Cranial CT scan showed left frontal hemorrhagic mass with surrounding vasogenic edema and a 2 mm rightward midline shift.  MRI solitary 2.6 cm left lateral frontal mass compatible with hemorrhagic neoplasm. CT of the chest abdomen pelvis showed spiculated right middle lobe pulmonary nodule measuring 11 x 11 x 10 mm.  No other evidence of metastatic disease.  Patient underwent left frontal craniotomy with tumor resection on 06/29/19 per Dr. Venetia Constable.  She was extubated on 06/24/2019.  EEG did not show seizure activity.  Keppra maintained for seizure prophylaxis.  Oncology services Dr. 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  Radiation oncology consulted with plans for radiation therapy to the brain.  Plan PET scan as outpatient.  Decadron protocol as indicated and course has been completed.  Tolerating a dysphagia #1 thin liquid diet.  Subcutaneous Lovenox was initiated for DVT prophylaxis 07/03/2019.  Patient with oral thrush completing course of Diflucan.  Therapy evaluations completed and patient was admitted for a comprehensive rehab program.  Please see preadmission assessment from earlier today as well.  Review of Systems  Unable to perform ROS: Language   Past Medical History:  Diagnosis Date  . Anxiety   . Hypertension    Past Surgical History:  Procedure Laterality Date  . APPLICATION OF CRANIAL NAVIGATION N/A 06/29/2019   Procedure: APPLICATION OF CRANIAL NAVIGATION;  Surgeon:  Judith Part, MD;  Location: [facility];  Service: Neurosurgery;  Laterality: N/A;  . CRANIOTOMY Left 06/29/2019   Procedure: LEFT CRANIOTOMY FOR TUMOR EXCISION;  Surgeon: Judith Part, MD;  Location: [facility];  Service: Neurosurgery;  Laterality: Left;   History reviewed. No pertinent family history on file, unable to obtain from patient. Social History:  reports that she has never smoked. She has never used smokeless tobacco. She reports that she does not drink alcohol or use drugs. Allergies:  Allergies  Allergen Reactions  . Lisinopril Other (See Comments)    Per med list [facility]    Medications Prior to Admission  Medication Sig Dispense Refill  . dicyclomine (BENTYL) 20 MG tablet Take 20 mg by mouth 4 (four) times daily as needed for spasms (abdominal pain).    Marland Kitchen FLUoxetine (PROZAC) 20 MG capsule Take 20 mg by mouth daily.    . hydrochlorothiazide (HYDRODIURIL) 25 MG tablet Take 25 mg by mouth daily.    Marland Kitchen LORazepam (ATIVAN) 0.5 MG tablet Take 0.25 mg by mouth at bedtime as needed for anxiety.    . melatonin 1 MG TABS tablet Take 1-3 mg by mouth at bedtime as needed (sleep).    . methylcellulose (CITRUCEL) oral powder Take 1 packet by mouth daily. Mix with 8oz of water or juice    . omeprazole (PRILOSEC) 20 MG capsule Take 20 mg by mouth daily.    . pravastatin (PRAVACHOL) 20 MG tablet Take 20 mg by mouth every evening.    . propranolol (INDERAL) 10 MG tablet Take 10 mg by mouth 2 (two) times daily.    Marland Kitchen  traZODone (DESYREL) 50 MG tablet Take 100 mg by mouth at bedtime.      Drug Regimen Review Drug regimen was reviewed and remains appropriate with no significant issues identified  Home: Home Living Family/patient expects to be discharged to:: Private residence Living Arrangements: Spouse/significant other Available Help at Discharge: Family, Available 24 hours/day Type of Home: House Home Access: Level entry Home Layout: Two level, Able to  live on main level with bedroom/bathroom Alternate Level Stairs-Number of Steps: flight Bathroom Shower/Tub: Optometrist: Yes Home Equipment: None  Lives With: Spouse   Functional History: Prior Function Level of Independence: Independent Comments: pt walks around neighborhood with spouse often. Per son pt takes bird baths  Functional Status:  Mobility: Bed Mobility Overal bed mobility: Needs Assistance Bed Mobility: Supine to Sit Supine to sit: Min guard, HOB elevated General bed mobility comments: pt able to long sit in bed; cues for sequencing to get to EOB Transfers Overall transfer level: Needs assistance Equipment used: Rolling walker (2 wheeled) Transfers: Sit to/from Stand Sit to Stand: Min assist Stand pivot transfers: Mod assist General transfer comment: pt stood X 2 from EOB with min A to power up and for balance upon standing (2 person hand held assist use to progress mobility) Ambulation/Gait Ambulation/Gait assistance: Min assist, +2 physical assistance, +2 safety/equipment Gait Distance (Feet): (~60 ft) Assistive device: 2 person hand held assist Gait Pattern/deviations: Step-through pattern, Decreased stride length, Shuffle, Trunk flexed, Narrow base of support General Gait Details: assist for balance; cues for increased bilat step lengths; no change in gait noted with cues Gait velocity: very slow    ADL: ADL Overall ADL's : Needs assistance/impaired Eating/Feeding: Set up, Minimal assistance, Sitting Eating/Feeding Details (indicate cue type and reason): assist to open containers. Able to load spoon and bring food to mouth with no physical assist. suspect needing assist for full meal consumption 2/2 fatigue. Grooming: Oral care, Maximal assistance, Standing Grooming Details (indicate cue type and reason): Pt able to identiy toothbrush, but unable to sequence toothpaste or wetting of toothbrush without  cues to complete in appropriate order Upper Body Bathing: Maximal assistance, Sitting Lower Body Bathing: Maximal assistance, Sit to/from stand Upper Body Dressing : Maximal assistance, Sitting Lower Body Dressing: Sitting/lateral leans, Minimal assistance, Min guard Lower Body Dressing Details (indicate cue type and reason): Donning socks, increased cues to adjust sock on R foot, increased prompting to initiate task, socks had to be placed in pt's hands to initiate Toilet Transfer: Moderate assistance, +2 for safety/equipment, Stand-pivot, Minimal assistance Toilet Transfer Details (indicate cue type and reason): HHA with PT simulated to recliner Toileting- Clothing Manipulation and Hygiene: Maximal assistance, Sit to/from stand Functional mobility during ADLs: +2 for physical assistance, +2 for safety/equipment, Moderate assistance General ADL Comments: HHA of 2 to walk, would not ambulate without 2 person assist, (would grip onto objects) decreased reports of dizziness, however continued deficits in vision which is difficult to determine due to language deficit  Cognition: Cognition Overall Cognitive Status: Impaired/Different from baseline Arousal/Alertness: Awake/alert Orientation Level: Oriented to person, Other (comment) Attention: Focused Behaviors: Restless, Impulsive Cognition Arousal/Alertness: Awake/alert Behavior During Therapy: Flat affect Overall Cognitive Status: Impaired/Different from baseline Area of Impairment: Attention, Following commands, Problem solving, Safety/judgement, Awareness, Memory Orientation Level: Disoriented to, Time, Situation Current Attention Level: Sustained Memory: Decreased recall of precautions, Decreased short-term memory Following Commands: Follows one step commands with increased time, Follows one step commands inconsistently Safety/Judgement: Decreased awareness of safety, Decreased awareness of  deficits Awareness: Intellectual Problem  Solving: Slow processing, Decreased initiation, Difficulty sequencing, Requires verbal cues, Requires tactile cues General Comments: pt with slow processing and at times not responding to commands or questions; pt able to complete mobility tasks when initiated by therapists Difficult to assess due to: Non-English speaking, Impaired communication  Physical Exam: Blood pressure 116/62, pulse (!) 110, temperature 98.5 F (36.9 C), temperature source Oral, resp. rate 18, height 5\' 2"  (1.575 m), weight 61.6 kg, SpO2 98 %. Physical Exam  Vitals reviewed. Constitutional: She appears well-developed and well-nourished.  HENT:  Head: Normocephalic and atraumatic.  Eyes: Right eye exhibits no discharge. Left eye exhibits no discharge. No scleral icterus.  Neck: No tracheal deviation present. No thyromegaly present.  Respiratory: Effort normal. No stridor. No respiratory distress.  GI: Soft. She exhibits no distension.  Musculoskeletal:     Comments: No edema or tenderness in extremities  Neurological: She is alert.  Patient is alert makes eye contact with examiner.  Examination limited by language barrier as well as cognition.   Unable to consistently follow commands Motor: Bilateral upper extremities appear to be 5/5 proximal distal Bilateral lower extremities, limited due to comprehension, but moving freely  Skin: Skin is warm and dry.  Psychiatric:  Appears to be blunted, delayed, slowed   Results for orders placed or performed during the hospital encounter of 06/27/19 (from the past 48 hour(s))  Procalcitonin     Status: None   Collection Time: 07/05/19  5:26 PM  Result Value Ref Range   Procalcitonin <0.10 ng/mL    Comment:        Interpretation: PCT (Procalcitonin) <= 0.5 ng/mL: Systemic infection (sepsis) is not likely. Local bacterial infection is possible. (NOTE)       Sepsis PCT Algorithm           Lower Respiratory Tract                                      Infection PCT  Algorithm    ----------------------------     ----------------------------         PCT < 0.25 ng/mL                PCT < 0.10 ng/mL         Strongly encourage             Strongly discourage   discontinuation of antibiotics    initiation of antibiotics    ----------------------------     -----------------------------       PCT 0.25 - 0.50 ng/mL            PCT 0.10 - 0.25 ng/mL               OR       >80% decrease in PCT            Discourage initiation of                                            antibiotics      Encourage discontinuation           of antibiotics    ----------------------------     -----------------------------         PCT >= 0.50 ng/mL  PCT 0.26 - 0.50 ng/mL               AND        <80% decrease in PCT             Encourage initiation of                                             antibiotics       Encourage continuation           of antibiotics    ----------------------------     -----------------------------        PCT >= 0.50 ng/mL                  PCT > 0.50 ng/mL               AND         increase in PCT                  Strongly encourage                                      initiation of antibiotics    Strongly encourage escalation           of antibiotics                                     -----------------------------                                           PCT <= 0.25 ng/mL                                                 OR                                        > 80% decrease in PCT                                     Discontinue / Do not initiate                                             antibiotics Performed at [facility], [address] [place], [place] [address]   CBC with Differential/Platelet     Status: Abnormal   Collection Time: 07/06/19  7:02 AM  Result Value Ref Range   WBC 12.7 (H) 4.0 - 10.5 K/uL   RBC 3.99 3.87 - 5.11 MIL/uL   Hemoglobin 12.2 12.0 - 15.0 g/dL   HCT 37.9 36.0 - 46.0 %   MCV 95.0 80.0 -  100.0 fL   MCH 30.6  26.0 - 34.0 pg   MCHC 32.2 30.0 - 36.0 g/dL   RDW 13.3 11.5 - 15.5 %   Platelets 310 150 - 400 K/uL   nRBC 0.0 0.0 - 0.2 %   Neutrophils Relative % 72 %   Neutro Abs 9.2 (H) 1.7 - 7.7 K/uL   Lymphocytes Relative 17 %   Lymphs Abs 2.1 0.7 - 4.0 K/uL   Monocytes Relative 6 %   Monocytes Absolute 0.7 0.1 - 1.0 K/uL   Eosinophils Relative 2 %   Eosinophils Absolute 0.3 0.0 - 0.5 K/uL   Basophils Relative 0 %   Basophils Absolute 0.0 0.0 - 0.1 K/uL   Immature Granulocytes 3 %   Abs Immature Granulocytes 0.36 (H) 0.00 - 0.07 K/uL    Comment: Performed at [facility] [address], [place], [place] [address]  Basic metabolic panel     Status: Abnormal   Collection Time: 07/06/19  7:02 AM  Result Value Ref Range   Sodium 139 135 - 145 mmol/L   Potassium 4.1 3.5 - 5.1 mmol/L   Chloride 100 98 - 111 mmol/L   CO2 30 22 - 32 mmol/L   Glucose, Bld 123 (H) 70 - 99 mg/dL    Comment: Glucose reference range applies only to samples taken after fasting for at least 8 hours.   BUN 20 8 - 23 mg/dL   Creatinine, Ser 0.71 0.44 - 1.00 mg/dL   Calcium 9.2 8.9 - 10.3 mg/dL   GFR calc non Af Amer >60 >60 mL/min   GFR calc Af Amer >60 >60 mL/min   Anion gap 9 5 - 15    Comment: Performed at [facility] [address] [place], [place] [address]  Basic metabolic panel     Status: Abnormal   Collection Time: 07/07/19  6:44 AM  Result Value Ref Range   Sodium 138 135 - 145 mmol/L   Potassium 4.0 3.5 - 5.1 mmol/L   Chloride 102 98 - 111 mmol/L   CO2 26 22 - 32 mmol/L   Glucose, Bld 140 (H) 70 - 99 mg/dL    Comment: Glucose reference range applies only to samples taken after fasting for at least 8 hours.   BUN 24 (H) 8 - 23 mg/dL   Creatinine, Ser 0.75 0.44 - 1.00 mg/dL   Calcium 9.5 8.9 - 10.3 mg/dL   GFR calc non Af Amer >60 >60 mL/min   GFR calc Af Amer >60 >60 mL/min   Anion gap 10 5 - 15    Comment: Performed at [facility] [address],  [place], [place] [address]  CBC with Differential/Platelet     Status: Abnormal   Collection Time: 07/07/19  6:44 AM  Result Value Ref Range   WBC 12.5 (H) 4.0 - 10.5 K/uL   RBC 3.97 3.87 - 5.11 MIL/uL   Hemoglobin 12.1 12.0 - 15.0 g/dL   HCT 37.6 36.0 - 46.0 %   MCV 94.7 80.0 - 100.0 fL   MCH 30.5 26.0 - 34.0 pg   MCHC 32.2 30.0 - 36.0 g/dL   RDW 13.4 11.5 - 15.5 %   Platelets 305 150 - 400 K/uL   nRBC 0.0 0.0 - 0.2 %   Neutrophils Relative % 71 %   Neutro Abs 8.8 (H) 1.7 - 7.7 K/uL   Lymphocytes Relative 20 %   Lymphs Abs 2.6 0.7 - 4.0 K/uL   Monocytes Relative 5 %   Monocytes  Absolute 0.6 0.1 - 1.0 K/uL   Eosinophils Relative 1 %   Eosinophils Absolute 0.2 0.0 - 0.5 K/uL   Basophils Relative 0 %   Basophils Absolute 0.0 0.0 - 0.1 K/uL   Immature Granulocytes 3 %   Abs Immature Granulocytes 0.33 (H) 0.00 - 0.07 K/uL    Comment: Performed at [facility] [address], [place], [place] [address]   No results found. Medical Problem List and Plan: 1.  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  Status post left frontal craniotomy tumor resection 06/29/2019.  Patient will follow up outpatient oncology services Dr. Marin Olp as well as radiation oncology Dr. Mickeal Skinner  -patient may not shower  -ELOS/Goals: 8-13 days/supervision/Min A  Admit to CIR 2.  Antithrombotics: -DVT/anticoagulation: Subcutaneous Lovenox  -antiplatelet therapy: N/A 3. Pain Management: Tylenol as needed  Monitor headaches with increased exertion 4. Mood: Provide emotional support  -antipsychotic agents: N/A 5. Neuropsych: This patient is not fully capable of making decisions on her own behalf. 6. Skin/Wound Care: Routine skin checks 7. Fluids/Electrolytes/Nutrition: Routine in and outs.  CMP ordered. 8.  Seizure disorder.  Continue Keppra 1000 mg twice daily.  EEG negative 9.  Oral thrush.  Complete course of Diflucan 10.  Tachycardia.  ECG  on 5/3 showing atrial flutter, await official read  Repeat ECG ordered 11.  Leukocytosis-likely secondary to steroids  CBC ordered  Cathlyn Parsons, PA-C 07/07/2019  I have personally performed a face to face diagnostic evaluation, including, but not limited to relevant history and physical exam findings, of this patient and developed relevant assessment and plan.  Additionally, I have reviewed and concur with the physician assistant's documentation above.  Delice Lesch, MD, ABPMR  The patient's status has not changed. The original post admission physician evaluation remains appropriate, and any changes from the pre-admission screening or documentation from the acute chart are noted above.   Delice Lesch, MD, ABPMR

## 2019-07-07 NOTE — Discharge Instructions (Addendum)
Seizure, Adult A seizure is a sudden burst of abnormal electrical activity in the brain. Seizures usually last from 30 seconds to 2 minutes. They can cause many different symptoms. Usually, seizures are not harmful unless they last a long time. What are the causes? Common causes of this condition include:  Fever or infection.  Conditions that affect the brain, such as: ? A brain abnormality that you were born with. ? A brain or head injury. ? Bleeding in the brain. ? A tumor. ? Stroke. ? Brain disorders such as autism or cerebral palsy.  Low blood sugar.  Conditions that are passed from parent to child (are inherited).  Problems with substances, such as: ? Having a reaction to a drug or a medicine. ? Suddenly stopping the use of a substance (withdrawal). In some cases, the cause may not be known. A person who has repeated seizures over time without a clear cause has a condition called epilepsy. What increases the risk? You are more likely to get this condition if you have:  A family history of epilepsy.  Had a seizure in the past.  A brain disorder.  A history of head injury, lack of oxygen at birth, or strokes. What are the signs or symptoms? There are many types of seizures. The symptoms vary depending on the type of seizure you have. Examples of symptoms during a seizure include:  Shaking (convulsions).  Stiffness in the body.  Passing out (losing consciousness).  Head nodding.  Staring.  Not responding to sound or touch.  Loss of bladder control and bowel control. Some people have symptoms right before and right after a seizure happens. Symptoms before a seizure may include:  Fear.  Worry (anxiety).  Feeling like you may vomit (nauseous).  Feeling like the room is spinning (vertigo).  Feeling like you saw or heard something before (dj vu).  Odd tastes or smells.  Changes in how you see. You may see flashing lights or spots. Symptoms after a  seizure happens can include:  Confusion.  Sleepiness.  Headache.  Weakness on one side of the body. How is this treated? Most seizures will stop on their own in under 5 minutes. In these cases, no treatment is needed. Seizures that last longer than 5 minutes will usually need treatment. Treatment can include:  Medicines given through an IV tube.  Avoiding things that are known to cause your seizures. These can include medicines that you take for another condition.  Medicines to treat epilepsy.  Surgery to stop the seizures. This may be needed if medicines do not help. Follow these instructions at home: Medicines  Take over-the-counter and prescription medicines only as told by your doctor.  Do not eat or drink anything that may keep your medicine from working, such as alcohol. Activity  Do not do any activities that would be dangerous if you had another seizure, like driving or swimming. Wait until your doctor says it is safe for you to do them.  If you live in the [place] ask your local DMV (department of motor vehicles) when you can drive.  Get plenty of rest. Teaching others Teach friends and family what to do when you have a seizure. They should:  Lay you on the ground.  Protect your head and body.  Loosen any tight clothing around your neck.  Turn you on your side.  Not hold you down.  Not put anything into your mouth.  Know whether or not you need emergency care.  Stay  with you until you are better.  General instructions  Contact your doctor each time you have a seizure.  Avoid anything that gives you seizures.  Keep a seizure diary. Write down: ? What you think caused each seizure. ? What you remember about each seizure.  Keep all follow-up visits as told by your doctor. This is important. Contact a doctor if:  You have another seizure.  You have seizures more often.  There is any change in what happens during your seizures.  You keep having  seizures with treatment.  You have symptoms of being sick or having an infection. Get help right away if:  You have a seizure that: ? Lasts longer than 5 minutes. ? Is different than seizures you had before. ? Makes it harder to breathe. ? Happens after you hurt your head.  You have any of these symptoms after a seizure: ? Not being able to speak. ? Not being able to use a part of your body. ? Confusion. ? A bad headache.  You have two or more seizures in a row.  You do not wake up right after a seizure.  You get hurt during a seizure. These symptoms may be an emergency. Do not wait to see if the symptoms will go away. Get medical help right away. Call your local emergency services (911 in the [place] Do not drive yourself to the hospital. Summary  Seizures usually last from 30 seconds to 2 minutes. Usually, they are not harmful unless they last a long time.  Do not eat or drink anything that may keep your medicine from working, such as alcohol.  Teach friends and family what to do when you have a seizure.  Contact your doctor each time you have a seizure. This information is not intended to replace advice given to you by your health care provider. Make sure you discuss any questions you have with your health care provider. Document Revised: 04/29/2018 Document Reviewed: 04/29/2018 Elsevier Patient Education  2020 [place] Lung cancer is an abnormal growth of cancerous cells that forms a mass (malignant tumor) in a lung. There are several types of lung cancer. The types are based on the appearance of the tumor cells. The two most common types are:  Non-small cell lung cancer. This type of lung cancer is the most common type. Non-small cell lung cancers include squamous cell carcinoma, adenocarcinoma, and large cell carcinoma.  Small cell lung cancer. In this type of lung cancer, abnormal cells are smaller than those of non-small cell lung cancer. Small cell  lung cancer gets worse (progresses) faster than non-small cell lung cancer. What are the causes? The most common cause of lung cancer is smoking tobacco. The second most common cause is exposure to a chemical called radon. What increases the risk? You are more likely to develop this condition if:  You smoke tobacco.  You have been exposed to: ? Secondhand tobacco smoke. ? Radon gas. ? Uranium. ? Asbestos. ? Arsenic in drinking water. ? Air pollution.  You have a family or personal history of lung cancer.  You have had lung radiation therapy in the past.  You are older than age 49. What are the signs or symptoms? In the early stages, you may not have any symptoms. As the cancer progresses, symptoms may include:  A lasting cough, possibly with blood.  Fatigue.  Unexplained weight loss.  Shortness of breath.  Loud breathing (wheezing).  Chest pain.  Loss of appetite.  Symptoms of advanced lung cancer include:  Hoarseness.  Bone or joint pain.  Weakness.  Change in the structure of the fingernails (clubbing), so that the nail looks like an upside-down spoon.  Swelling of the face or arms.  Inability to move the face (paralysis).  Drooping eyelids. How is this diagnosed? This condition may be diagnosed based on:  Your symptoms and medical history.  A physical exam.  A chest X-ray.  A CT scan.  Blood tests.  Sputum tests.  Removal of a sample of lung tissue (lung biopsy) for testing. Your cancer will be assessed (staged) to determine how severe it is and how much it has spread (metastasized). How is this treated? Treatment depends on the type and stage of your cancer. Treatment may include one or more of the following:  Surgery to remove as much of the cancer as possible. Lymph nodes in the area may be removed and tested for cancer as well.  Medicines that kill cancer cells (chemotherapy).  High-energy rays that kill cancer cells (radiation  therapy).  Chemotherapy. This treatment uses medicines to destroy cancer cells.  Targeted therapy. This targets specific parts of cancer cells and the area around them to block the growth and spread of the cancer. Targeted therapy can help limit the damage to healthy cells. Follow these instructions at home: Eating and drinking  Some of your treatments might affect your appetite. If you are having problems eating, or if you do not have an appetite, meet with a dietitian.  If you have side effects that affect your appetite, it may help to: ? Eat smaller meals and snacks often. ? Drink high-nutrition and high-calorie shakes or supplements. ? Eat bland and soft foods that are easy to eat. ? Avoid eating foods that are hot, spicy, or hard to swallow. General instructions   Do not use any products that contain nicotine or tobacco, such as cigarettes and e-cigarettes. If you need help quitting, ask your health care provider.  Do not drink alcohol.  If you are admitted to the hospital, make sure your cancer specialist (oncologist) is aware. Your cancer may affect your treatment for other conditions.  Take over-the-counter and prescription medicines only as told by your health care provider.  Consider joining a support group for people who have been diagnosed with lung cancer.  Work with your health care provider to manage any side effects of treatment.  Keep all follow-up visits as told by your health care provider. This is important. Where to find more information  American Cancer Society: https://www.cancer.[place] https://www.cancer.gov Contact a health care provider if you:  Lose weight without trying.  Have a persistent cough and wheezing.  Feel short of breath.  Get tired easily.  Have bone or joint pain.  Have difficulty swallowing.  Notice that your voice is changing or getting hoarse.  Have pain that does not get better with  medicine. Get help right away if you:  Cough up blood.  Have new breathing problems.  Have chest pain.  Have a fever.  Have swelling in an ankle, leg, or arm, or the face or neck.  Have paralysis in your face.  Are very confused.  Have a drooping eyelid. Summary  Lung cancer is an abnormal growth of cancerous cells that forms a mass (malignant tumor) in a lung.  There are several types of lung cancer. The types are based on the appearance of the tumor cells. The two most common types  are non-small cell and small cell.  The most common cause of lung cancer is smoking tobacco.  Early symptoms include a lasting cough, possibly with blood, and fatigue, unexplained weight loss, and shortness of breath.  After diagnosis, treatment depends on the type and stage of your cancer. This information is not intended to replace advice given to you by your health care provider. Make sure you discuss any questions you have with your health care provider. Document Revised: 01/22/2017 Document Reviewed: 12/17/2016 Elsevier Patient Education  2020 [employer].

## 2019-07-07 NOTE — Progress Notes (Signed)
Inpatient Rehabilitation Admissions Coordinator  I have a CIR bed available to admit patient to today. I met with patient and her spouse at bedside. I spoke with Son, Laverna Peace, [place] speaker, by phone and he is aware and in agreement, I have notified Dr, Nevada Crane, Acute team and [facility] team. I will make the arrnangements to admit today.  Danne Baxter, RN, MSN Rehab Admissions Coordinator [phone] 07/07/2019 10:17 AM

## 2019-07-07 NOTE — Progress Notes (Signed)
Mews score change to a 2 (HR) elevated due to patient ambulating to restroom, vitals retaken at a resting state, mews score now green

## 2019-07-08 ENCOUNTER — Inpatient Hospital Stay (HOSPITAL_COMMUNITY): Payer: Medicaid Other | Admitting: Physical Therapy

## 2019-07-08 ENCOUNTER — Inpatient Hospital Stay (HOSPITAL_COMMUNITY): Payer: Medicaid Other | Admitting: Occupational Therapy

## 2019-07-08 ENCOUNTER — Inpatient Hospital Stay (HOSPITAL_COMMUNITY): Payer: Medicaid Other | Admitting: Speech Pathology

## 2019-07-08 DIAGNOSIS — C7931 Secondary malignant neoplasm of brain: Secondary | ICD-10-CM

## 2019-07-08 DIAGNOSIS — R2689 Other abnormalities of gait and mobility: Secondary | ICD-10-CM | POA: Diagnosis not present

## 2019-07-08 MED ORDER — DOCUSATE SODIUM 100 MG PO CAPS
100.0000 mg | ORAL_CAPSULE | Freq: Two times a day (BID) | ORAL | Status: DC
  Administered 2019-07-08 – 2019-07-12 (×8): 100 mg via ORAL
  Filled 2019-07-08 (×8): qty 1

## 2019-07-08 MED ORDER — LEVETIRACETAM 500 MG PO TABS
1000.0000 mg | ORAL_TABLET | Freq: Two times a day (BID) | ORAL | Status: DC
  Administered 2019-07-08 – 2019-07-12 (×8): 1000 mg via ORAL
  Filled 2019-07-08 (×8): qty 2

## 2019-07-08 MED ORDER — MELATONIN 3 MG PO TABS
3.0000 mg | ORAL_TABLET | Freq: Every day | ORAL | Status: DC
  Administered 2019-07-08: 3 mg via ORAL
  Filled 2019-07-08: qty 1

## 2019-07-08 NOTE — Progress Notes (Signed)
[place] PHYSICAL MEDICINE & REHABILITATION PROGRESS NOTE   Subjective/Complaints: Speech improved. Son has questions regarding genetic testing and plan for radiosurgery Has some throat pain Difficulty sleeping  Review of Systems  Unable to perform ROS: Language   Objective:   No results found. Recent Labs    07/06/19 0702 07/07/19 0644  WBC 12.7* 12.5*  HGB 12.2 12.1  HCT 37.9 37.6  PLT 310 305   Recent Labs    07/06/19 0702 07/07/19 0644  NA 139 138  K 4.1 4.0  CL 100 102  CO2 30 26  GLUCOSE 123* 140*  BUN 20 24*  CREATININE 0.71 0.75  CALCIUM 9.2 9.5    Intake/Output Summary (Last 24 hours) at 07/08/2019 1432 Last data filed at 07/08/2019 1300 Gross per 24 hour  Intake 180 ml  Output --  Net 180 ml     Physical Exam: Vital Signs Blood pressure 130/71, pulse 96, temperature 98.1 F (36.7 C), resp. rate 15, height 5\' 2"  (1.575 m), weight 55.4 kg, SpO2 100 %. Vitals reviewed. Constitutional: She appears well-developed and well-nourished.  HENT:  Head: Normocephalic and atraumatic.  No more evidence of thrush. Poor dental hygience Eyes: Right eye exhibits no discharge. Left eye exhibits no discharge. No scleral icterus.  Neck: No tracheal deviation present. No thyromegaly present.  Respiratory: Effort normal. No stridor. No respiratory distress.  GI: Soft. She exhibits no distension.  Musculoskeletal:     Comments: No edema or tenderness in extremities  Neurological: She is alert.  Patient is alert makes eye contact with examiner.  Examination limited by language barrier as well as cognition.   Unable to consistently follow commands Motor: Bilateral upper extremities appear to be 5/5 proximal distal Bilateral lower extremities, limited due to comprehension, but moving freely  Skin: Skin is warm and dry.  Psychiatric:  Appears to be blunted, delayed, slowed   Assessment/Plan: 1. 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  Physiatrist is providing close team supervision and 24 hour management of active medical problems listed below.  Physiatrist and rehab team continue to assess barriers to discharge/monitor patient progress toward functional and medical goals  Care Tool:  Bathing    Body parts bathed by patient: Front perineal area, Right arm, Chest, Left arm, Abdomen, Left lower leg, Face, Left upper leg, Buttocks, Right upper leg, Right lower leg         Bathing assist Assist Level: Contact Guard/Touching assist     Upper Body Dressing/Undressing Upper body dressing   What is the patient wearing?: Pull over shirt    Upper body assist Assist Level: Minimal Assistance - Patient > 75%    Lower Body Dressing/Undressing Lower body dressing      What is the patient wearing?: Underwear/pull up, Pants     Lower body assist Assist for lower body dressing: Minimal Assistance - Patient > 75%     Toileting Toileting    Toileting assist Assist for toileting: Minimal Assistance - Patient > 75%     Transfers Chair/bed transfer  Transfers assist     Chair/bed transfer assist level: Minimal Assistance - Patient > 75%     Locomotion Ambulation   Ambulation assist              Walk 10 feet activity   Assist           Walk 50 feet activity  Assist           Walk 150 feet activity   Assist           Walk 10 feet on uneven surface  activity   Assist           Wheelchair     Assist               Wheelchair 50 feet with 2 turns activity    Assist            Wheelchair 150 feet activity     Assist          Blood pressure 130/71, pulse 96, temperature 98.1 F (36.7 C), resp. rate 15, height 5\' 2"  (1.575 m), weight 55.4 kg, SpO2 100 %.    Medical Problem List and Plan: 1.  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  Status post left frontal craniotomy tumor resection 06/29/2019.  Patient will follow up outpatient oncology services Dr. Marin Olp as well as radiation oncology Dr. Mickeal Skinner             -patient may not shower             -ELOS/Goals: 8-13 days/supervision/Min A             Initial CIR therapies today. Reviewed oncology plan with son.  2.  Antithrombotics: -DVT/anticoagulation: Subcutaneous Lovenox             -antiplatelet therapy: N/A 3. Pain Management: Tylenol as needed             Monitor headaches with increased exertion 4. Mood: Provide emotional support             -antipsychotic agents: N/A 5. Neuropsych: This patient is not fully capable of making decisions on her own behalf. 6. Skin/Wound Care: Routine skin checks 7. Fluids/Electrolytes/Nutrition: Routine in and outs.  CMP ordered. Ensure.  8.  Seizure disorder.  Continue Keppra 1000 mg twice daily.  EEG negative 9.  Oral thrush.  Complete course of Diflucan 10.  Tachycardia.             ECG on 5/3 showing atrial flutter, await official read             Repeat ECG ordered 11.  Leukocytosis-likely secondary to steroids             CBC ordered. Trending downward.  12. Throat pain: no evidence of thrush, but continue nystatin swish and swallow given continued throat pain. Also has mouth rinse.  LOS: 1 days A FACE TO FACE EVALUATION WAS PERFORMED  Clide Deutscher Camaryn Lumbert 07/08/2019, 2:32 PM

## 2019-07-08 NOTE — Evaluation (Signed)
Occupational Therapy Assessment and Plan  Patient Details  Name: Sara Schroeder MRN: [id number] Date of Birth: 1950-10-26  OT Diagnosis: apraxia, cognitive deficits and muscle weakness (generalized) Rehab Potential: Rehab Potential (ACUTE ONLY): Excellent ELOS: 5-7 days   Today's Date: 07/08/2019 OT Individual Time: 0900-1000 OT Individual Time Calculation (min): 60 min     Problem List:  Patient Active Problem List   Diagnosis Date Noted  . Metastatic adenocarcinoma ([place]) 07/07/2019  . Metastatic adenocarcinoma to brain ([place])   . Leucocytosis   . Tachycardia   . Seizures ([place])   . 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 Brain metastasis ([place]) 07/06/2019  . Acute hypoxemic respiratory failure ([place])   . Intracerebral hemorrhage ([place]) 06/27/2019    Past Medical History:  Past Medical History:  Diagnosis Date  . Anxiety   . Hypertension    Past Surgical History:  Past Surgical History:  Procedure Laterality Date  . APPLICATION OF CRANIAL NAVIGATION N/A 06/29/2019   Procedure: APPLICATION OF CRANIAL NAVIGATION;  Surgeon: Judith Part, MD;  Location: [facility];  Service: Neurosurgery;  Laterality: N/A;  . CRANIOTOMY Left 06/29/2019   Procedure: LEFT CRANIOTOMY FOR TUMOR EXCISION;  Surgeon: Judith Part, MD;  Location: [facility];  Service: Neurosurgery;  Laterality: Left;    Assessment & Plan Clinical Impression: Patient is a 69 y.o. year old female with history of anxiety and hypertension as well as history of pulmonary nodule January 2020.  Presented 06/27/2019 with reported seizure and aphasia.  Per chart review independent prior to admission living with spouse and family.  Two-level home bed and bath on main level.  Cranial CT scan showed left frontal lobe hemorrhagic mass with surrounding vasogenic edema and a 2 mm of rightward midline shift.  MRI solitary 2.6 cm left lateral frontal mass compatible with hemorrhagic neoplasm.  CT of chest abdomen  pelvis showed spiculated right middle lobe pulmonary nodule measuring 11 x 11 x 10 mm.  No other evidence of metastatic disease.  Patient underwent left frontal craniotomy tumor resection 06/29/2019 per Dr. Venetia Constable.  She was extubated 06/30/2019.  EEG 06/30/2019 showed no seizure activity and currently maintained on Keppra for seizure prophylaxis.    Patient transferred to CIR on 07/07/2019 .    Patient currently requires min with basic self-care skills secondary to muscle weakness, motor apraxia, decreased coordination and decreased motor planning, decreased attention to right, decreased initiation, decreased awareness and decreased problem solving and decreased sitting balance, decreased standing balance, decreased postural control and decreased balance strategies.  Prior to hospitalization, patient could complete BADL with independent .  Patient will benefit from skilled intervention to increase independence with basic self-care skills prior to discharge home with care partner.  Anticipate patient will require 24 hour supervision and follow up home health vs OPOT.  OT - End of Session Endurance Deficit: Yes Endurance Deficit Description: Rest breaks within BADL tasks OT Assessment Rehab Potential (ACUTE ONLY): Excellent OT Patient demonstrates impairments in the following area(s): Balance;Cognition;Endurance;Motor;Perception OT Basic ADL's Functional Problem(s): Grooming;Bathing;Dressing;Toileting OT Transfers Functional Problem(s): Toilet;Tub/Shower OT Additional Impairment(s): Fuctional Use of Upper Extremity OT Plan OT Intensity: Minimum of 1-2 x/day, 45 to 90 minutes OT Frequency: 5 out of 7 days OT Duration/Estimated Length of Stay: 5-7 days OT Treatment/Interventions: Balance/vestibular training;Cognitive remediation/compensation;Community reintegration;Discharge planning;DME/adaptive equipment instruction;Functional mobility training;Neuromuscular re-education;Patient/family  education;Therapeutic Activities;Therapeutic Exercise;Self Care/advanced ADL retraining;Psychosocial support;UE/LE Strength taining/ROM;UE/LE Coordination activities;Visual/perceptual remediation/compensation OT Basic Self-Care Anticipated Outcome(s): supervision OT Toileting Anticipated Outcome(s): supervision OT  Bathroom Transfers Anticipated Outcome(s): supervision OT Recommendation Patient destination: Home Follow Up Recommendations: Home health OT Equipment Recommended: To be determined   Skilled Therapeutic Intervention Pt greeted semi-reclined in bed and agreeable to OT eval and treat.  Pt's son "Sara Schroeder" present as interpreter throughout session. OT eval completed addressing rehab process, OT purpose, POC, ELOS, and goals.  Pt completed bed mobility with supervision. Stand-pivot to wc with min A. Nursing administered medications, then pt completed bathing/dressing tasks at the sink. Pt needed overall min A and min A for standing balance when washing bottom and pulling up pants. Pt with difficulty comprehending MMT questions, finger-to-nose, and coordination tests. Pt ambulated 10 feet with min HHA and verbal cues not to reach for walls or furniture. Pt returned to bed at end of session and left semi-reclined in bed with needs met.   OT Evaluation Precautions/Restrictions  Precautions Precautions: Fall Precaution Comments: impaired vision Restrictions Weight Bearing Restrictions: No Pain Pain Assessment Pain Scale: 0-10 Pain Score:0 Home Living/Prior Functioning Home Living Available Help at Discharge: Family, Available 24 hours/day(primarily pt's husband) Type of Home: House Home Access: Stairs to enter [employer] of Steps: 1step-up into the house Home Layout: Two level, Able to live on main level with bedroom/bathroom Alternate Level Stairs-Number of Steps: flight Bathroom Shower/Tub: Government social research officer Accessibility: Yes  Lives  With: Daughter, Spouse(husband, daughter & son-in-law with their 3 children) IADL History IADL Comments: Active and indepedent PTA Prior Function Level of Independence: Independent with gait, Independent with homemaking with ambulation, Independent with transfers  Able to Take Stairs?: Yes Driving: No(doesn't have license) Vocation: Retired Leisure: Hobbies-yes (Comment) Comments: talk with family/friends on the phone and enjoys listening to gospel songs ADL ADL Eating: Supervision/safety Grooming: Supervision/safety, Setup Upper Body Bathing: Minimal assistance Lower Body Bathing: Minimal assistance Upper Body Dressing: Minimal assistance Lower Body Dressing: Minimal assistance Toileting: Minimal assistance Toilet Transfer: Minimal assistance Tub/Shower Transfer: Minimal assistance Vision Baseline Vision/History: Wears glasses Wears Glasses: At all times Patient Visual Report: Blurring of vision Vision Assessment?: Vision impaired- to be further tested in functional context Perception  Perception: (right inattention versus other visual field or acuity defici) Cognition Overall Cognitive Status: Impaired/Different from baseline Arousal/Alertness: Awake/alert Orientation Level: Person;Place;Situation Person: Oriented Place: Oriented Situation: Oriented Year: 2021 Month: March Day of Week: Correct Memory: Impaired Memory Impairment: Storage deficit Immediate Memory Recall: Sock;Blue;Bed Memory Recall Sock: With Cue Memory Recall Blue: Without Cue Memory Recall Bed: Not able to recall Attention: Focused;Sustained Focused Attention: Impaired Sustained Attention: Impaired Sustained Attention Impairment: Functional basic;Verbal basic Awareness: Impaired Awareness Impairment: Emergent impairment Problem Solving: Impaired Problem Solving Impairment: Functional basic;Verbal basic Behaviors: Impulsive Safety/Judgment: Impaired Comments: right inattention versus other  visual field or acuity deficits Sensation Sensation Light Touch: Appears Intact Coordination Fine Motor Movements are Fluid and Coordinated: No Finger Nose Finger Test: slight decreased smoothness and accuracy on R side Motor  Motor Motor: Hemiplegia(mild R hemiplegia) Mobility  Transfers Sit to Stand: Minimal Assistance - Patient > 75% Stand to Sit: Minimal Assistance - Patient > 75%  Balance Balance Balance Assessed: Yes Static Sitting Balance Static Sitting - Balance Support: Feet supported Dynamic Sitting Balance Dynamic Sitting - Level of Assistance: 5: Stand by assistance Static Standing Balance Static Standing - Balance Support: During functional activity Static Standing - Level of Assistance: 4: Min assist Dynamic Standing Balance Dynamic Standing - Balance Support: During functional activity Dynamic Standing - Level of Assistance: 4: Min assist Extremity/Trunk Assessment RUE Assessment General Strength Comments:  difficulty assessing 2/2 cognitive deficits, grossly 4/5 LUE Assessment General Strength Comments: Difficult to assess 2/;2 cognitive deficits, grossy 4+/5    Refer to Care Plan for Long Term Goals  Recommendations for other services: None    Discharge Criteria: Patient will be discharged from OT if patient refuses treatment 3 consecutive times without medical reason, if treatment goals not met, if there is a change in medical status, if patient makes no progress towards goals or if patient is discharged from hospital.  The above assessment, treatment plan, treatment alternatives and goals were discussed and mutually agreed upon: by patient and by family  Valma Cava 07/08/2019, 1:22 PM

## 2019-07-08 NOTE — Evaluation (Addendum)
Speech Language Pathology Assessment and Plan  Patient Details  Name: Sara Schroeder MRN: [id number] Date of Birth: January 10, 1951  SLP Diagnosis: Aphasia;Cognitive Impairments  Rehab Potential: Good ELOS: 7 days    Today's Date: 07/08/2019 SLP Individual Time: 0800-0900 SLP Individual Time Calculation (min): 60 min   Problem List:  Patient Active Problem List   Diagnosis Date Noted  . Metastatic adenocarcinoma ([place]) 07/07/2019  . Metastatic adenocarcinoma to brain ([place])   . Leucocytosis   . Tachycardia   . Seizures ([place])   . 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 Brain metastasis ([place]) 07/06/2019  . Acute hypoxemic respiratory failure ([place])   . Intracerebral hemorrhage ([place]) 06/27/2019   Past Medical History:  Past Medical History:  Diagnosis Date  . Anxiety   . Hypertension    Past Surgical History:  Past Surgical History:  Procedure Laterality Date  . APPLICATION OF CRANIAL NAVIGATION N/A 06/29/2019   Procedure: APPLICATION OF CRANIAL NAVIGATION;  Surgeon: Judith Part, MD;  Location: [facility];  Service: Neurosurgery;  Laterality: N/A;  . CRANIOTOMY Left 06/29/2019   Procedure: LEFT CRANIOTOMY FOR TUMOR EXCISION;  Surgeon: Judith Part, MD;  Location: [facility];  Service: Neurosurgery;  Laterality: Left;    Assessment / Plan / Recommendation Clinical Impression   Sara Schroeder is a 69 year old right-handed female with history of anxiety and hypertension as well as history of pulmonary nodule January 2020.  History taken from chart review and sign due to cognition and language.  She presented on 06/27/2019 with seizures and aphasia.  Prior to admission patient was living with spouse and family.  Two-level home bed and bath on main level.  Cranial CT scan showed left frontal hemorrhagic mass with surrounding vasogenic edema and a 2 mm rightward midline shift.  MRI solitary 2.6 cm left lateral frontal mass compatible with hemorrhagic neoplasm. CT of the  chest abdomen pelvis showed spiculated right middle lobe pulmonary nodule measuring 11 x 11 x 10 mm.  No other evidence of metastatic disease.  Patient underwent left frontal craniotomy with tumor resection on 06/29/19 per Dr. Venetia Constable.  She was extubated on 06/24/2019.  EEG did not show seizure activity.  Keppra maintained for seizure prophylaxis.  Oncology services Dr. 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  Radiation oncology consulted with plans for radiation therapy to the brain.  Plan PET scan as outpatient.  Decadron protocol as indicated and course has been completed.  Tolerating a dysphagia #1 thin liquid diet.  Subcutaneous Lovenox was initiated for DVT prophylaxis 07/03/2019.  Patient with oral thrush completing course of Diflucan.  Therapy evaluations completed and patient was admitted for a comprehensive rehab program.  Please see preadmission assessment from earlier today as well.  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  Pt can answer basic yes/no questions and follow 1 step commands with reliable accuracy but has difficulty following multi-step commands or answering more abstract questions.  She has word finding deficits noted during confrontational naming tasks and in simple conversations with family and SLP.  Verbal errors were characterized by both semantic and phonemic paraphasias, requiring mod-max assist to correct.  Pt also presents with moderate cognitive deficits characterized by decreased sustained attention to tasks and decreased safety awareness with impulsivity in addition to a possible right inattention versus other visual field disturbance; however, given pt's  anticipated short length of stay, the extent of her language deficits, and pt's expression of frustration in the setting of  language deficits, I would favor prioritizing functional communication during her time on CIR.     Bedside Swallow Evaluation: Pt continues to endorse odynophagia with solids although pain is improving with medical management.  Pt was open to trying solids again in the hopes of resuming a regular diet.  Pt consumed regular textures and thin liquids with no overt s/s of aspiration and complete clearance of solids from the oral cavity post swallow.  Pt reports she is now willing to try a solids diet given improvements in pain.  I will liberalize her diet to regular textures and thin liquids to offer pt a variety of choices.  No further ST needs indicated for dysphagia at this time.    Given the abovementioned deficits, pt would benefit from skilled ST while inpatient in order to maximize functional independence and reduce burden of care prior to discharge.  Anticipate that pt will need 24/7 supervision at discharge in addition to [place] follow up at next level of care.     Skilled Therapeutic Interventions          Cognitive-linguistic and bedside swallow evaluation completed with results and recommendations reviewed with patient and family.     SLP Assessment  Patient will need skilled Speech Lanaguage Pathology Services during CIR admission    Recommendations  SLP Diet Recommendations: Age 69 appropriate regular solids;Thin Liquid Administration via: Cup;Straw Medication Administration: Whole meds with liquid Supervision: Patient able to self feed Compensations: Slow rate;Small sips/bites Postural Changes and/or Swallow Maneuvers: Seated upright 90 degrees Oral Care Recommendations: Oral care BID;Staff/trained caregiver to provide oral care Patient destination: Home Follow up Recommendations: Home Health SLP;Outpatient SLP;24 hour supervision/assistance Equipment Recommended: None recommended by SLP    SLP Frequency 3 to 5 out of 7 days   SLP Duration  SLP Intensity  SLP  Treatment/Interventions 7 days  Minumum of 1-2 x/day, 30 to 90 minutes  Cognitive remediation/compensation;Cueing hierarchy;Patient/family education;Environmental controls;Internal/external aids;Speech/Language facilitation    Pain Pain Assessment Pain Scale: 0-10 Pain Score: 0-No pain  Prior Functioning Cognitive/Linguistic Baseline: Within functional limits Type of Home: House  Lives With: Spouse Available Help at Discharge: Family;Available 24 hours/day  SLP Evaluation Cognition Overall Cognitive Status: Impaired/Different from baseline Arousal/Alertness: Awake/alert Orientation Level: Oriented X4(in a general sense (ie oriented to season but not exact date, oriented to seizure, etc.)) Attention: Sustained Sustained Attention: Impaired Sustained Attention Impairment: Functional basic;Verbal basic Memory: Impaired Memory Impairment: Storage deficit Awareness: Impaired Awareness Impairment: Emergent impairment Problem Solving: Impaired Problem Solving Impairment: Functional basic;Verbal basic Behaviors: Impulsive Safety/Judgment: Impaired Comments: right inattention versus other visual field or acuity deficits  Comprehension Auditory Comprehension Overall Auditory Comprehension: Impaired Yes/No Questions: Within Functional Limits Commands: Impaired Two Step Basic Commands: 25-49% accurate Conversation: Simple Interfering Components: Attention;Motor planning;Visual impairments EffectiveTechniques: Extra processing time;Repetition Expression Expression Primary Mode of Expression: Verbal Verbal Expression Overall Verbal Expression: Impaired Initiation: No impairment Automatic Speech: Name Level of Generative/Spontaneous Verbalization: Phrase Repetition: Impaired Level of Impairment: Sentence level Naming: Impairment Confrontation: Impaired Verbal Errors: Aware of errors;Phonemic paraphasias;Semantic paraphasias Oral Motor Oral Motor/Sensory Function Overall  Oral Motor/Sensory Function: Mild impairment Motor Speech Overall Motor Speech: Impaired Respiration: Within functional limits Phonation: Low vocal intensity Intelligibility: Intelligible Motor Planning: Impaired Level of Impairment: Sentence   PMSV Assessment  PMSV Trial Intelligibility: Intelligible  Bedside Swallowing Assessment General Previous Swallow Assessment: MBS 07/01/19 Diet Prior to this Study: Dysphagia  1 (puree);Thin liquids Temperature Spikes Noted: No Respiratory Status: Room air History of Recent Intubation: Yes Length of Intubations (days): 3 days Date extubated: 06/30/19 Behavior/Cognition: Alert;Cooperative Oral Cavity - Dentition: Adequate natural dentition Self-Feeding Abilities: Able to feed self Vision: Functional for self-feeding Patient Positioning: Upright in bed Baseline Vocal Quality: Low vocal intensity  Oral Care Assessment   Ice Chips   Thin Liquid Thin Liquid: Within functional limits Nectar Thick   Honey Thick   Puree   Solid Solid: Within functional limits BSE Assessment Risk for Aspiration Impact on safety and function: Mild aspiration risk Other Related Risk Factors: Cognitive impairment;Deconditioning  Short Term Goals: Week 1: SLP Short Term Goal 1 (Week 1): STG=LTG due to ELOS  Refer to Care Plan for Long Term Goals  Recommendations for other services: None   Discharge Criteria: Patient will be discharged from SLP if patient refuses treatment 3 consecutive times without medical reason, if treatment goals not met, if there is a change in medical status, if patient makes no progress towards goals or if patient is discharged from hospital.  The above assessment, treatment plan, treatment alternatives and goals were discussed and mutually agreed upon: by patient and by family  Emilio Math 07/08/2019, 12:19 PM

## 2019-07-08 NOTE — Evaluation (Signed)
Physical Therapy Assessment and Plan  Patient Details  Name: Denetra Formoso MRN: [id number] Date of Birth: Mar 12, 1950  PT Diagnosis: Abnormal posture, Abnormality of gait, Cognitive deficits, Difficulty walking, Hemiparesis dominant, Impaired cognition, Muscle weakness and Pain in throat Rehab Potential: Good ELOS: ~7 days   Today's Date: 07/08/2019 PT Individual Time: 1305-1406 PT Individual Time Calculation (min): 61 min   and  Today's Date: 07/08/2019 PT Missed Time: 14 Minutes Missed Time Reason: Patient fatigue   Problem List:  Patient Active Problem List   Diagnosis Date Noted  . Metastatic adenocarcinoma ([place]) 07/07/2019  . Metastatic adenocarcinoma to brain ([place])   . Leucocytosis   . Tachycardia   . Seizures ([place])   . 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 Brain metastasis ([place]) 07/06/2019  . Acute hypoxemic respiratory failure ([place])   . Intracerebral hemorrhage ([place]) 06/27/2019    Past Medical History:  Past Medical History:  Diagnosis Date  . Anxiety   . Hypertension    Past Surgical History:  Past Surgical History:  Procedure Laterality Date  . APPLICATION OF CRANIAL NAVIGATION N/A 06/29/2019   Procedure: APPLICATION OF CRANIAL NAVIGATION;  Surgeon: Judith Part, MD;  Location: [facility];  Service: Neurosurgery;  Laterality: N/A;  . CRANIOTOMY Left 06/29/2019   Procedure: LEFT CRANIOTOMY FOR TUMOR EXCISION;  Surgeon: Judith Part, MD;  Location: [facility];  Service: Neurosurgery;  Laterality: Left;    Assessment & Plan Clinical Impression: Patient is a 69 y.o. year old right-handed female with history of anxiety and hypertension as well as history of pulmonary nodule January 2020.  History taken from chart review and sign due to cognition and language.  She presented on 06/27/2019 with seizures and aphasia.  Prior to admission patient was living with spouse and family.  Two-level home bed and bath on main level.  Cranial CT scan  showed left frontal hemorrhagic mass with surrounding vasogenic edema and a 2 mm rightward midline shift.  MRI solitary 2.6 cm left lateral frontal mass compatible with hemorrhagic neoplasm. CT of the chest abdomen pelvis showed spiculated right middle lobe pulmonary nodule measuring 11 x 11 x 10 mm.  No other evidence of metastatic disease.  Patient underwent left frontal craniotomy with tumor resection on 06/29/19 per Dr. Venetia Constable.  She was extubated on 06/24/2019.  EEG did not show seizure activity.  Keppra maintained for seizure prophylaxis.  Oncology services Dr. 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  Radiation oncology consulted with plans for radiation therapy to the brain.  Plan PET scan as outpatient.  Decadron protocol as indicated and course has been completed.  Tolerating a dysphagia #1 thin liquid diet.  Subcutaneous Lovenox was initiated for DVT prophylaxis 07/03/2019.  Patient with oral thrush completing course of Diflucan.  Therapy evaluations completed and patient was admitted for a comprehensive rehab program. Patient transferred to CIR on 07/07/2019 .   Patient currently requires min with mobility secondary to muscle weakness, decreased cardiorespiratoy endurance, impaired timing and sequencing, abnormal tone, unbalanced muscle activation, motor apraxia, decreased coordination and decreased motor planning, decreased visual acuity, decreased awareness, decreased problem solving, decreased safety awareness, decreased memory and delayed processing and decreased standing balance, decreased postural control and decreased balance strategies.  Prior to hospitalization, patient was independent  with mobility and lived with Daughter, Spouse(husband, daughter & son-in-law with their 3 children) in a House home.  Home access is 1step-up into the  house.  Patient will benefit from skilled PT intervention to maximize safe  functional mobility, minimize fall risk and decrease caregiver burden for planned discharge home with 24 hour supervision.  Anticipate patient will benefit from follow up [place] at discharge.  PT - End of Session Activity Tolerance: Tolerates 30+ min activity with multiple rests Endurance Deficit: Yes Endurance Deficit Description: requires frequent seated rest breaks and due to fatigue unable to participate in full session PT Assessment Rehab Potential (ACUTE/IP ONLY): Good PT Barriers to Discharge: Medical stability;Pending chemo/radiation PT Patient demonstrates impairments in the following area(s): Balance;Safety;Behavior;Edema;Skin Integrity;Endurance;Motor;Nutrition;Pain;Perception PT Transfers Functional Problem(s): Bed Mobility;Bed to Chair;Car;Furniture;Floor PT Locomotion Functional Problem(s): Ambulation;Stairs PT Plan PT Intensity: Minimum of 1-2 x/day ,45 to 90 minutes PT Frequency: 5 out of 7 days PT Duration Estimated Length of Stay: ~7 days PT Treatment/Interventions: Ambulation/gait training;Community reintegration;DME/adaptive equipment instruction;Neuromuscular re-education;Psychosocial support;Stair training;UE/LE Strength taining/ROM;Balance/vestibular training;Discharge planning;Functional electrical stimulation;Pain management;Skin care/wound management;Therapeutic Activities;UE/LE Coordination activities;Cognitive remediation/compensation;Disease management/prevention;Functional mobility training;Patient/family education;Splinting/orthotics;Therapeutic Exercise;Visual/perceptual remediation/compensation PT Transfers Anticipated Outcome(s): supervision PT Locomotion Anticipated Outcome(s): supervision PT Recommendation Follow Up Recommendations: Home health PT;24 hour supervision/assistance Patient destination: Home Equipment Recommended: To be determined Equipment Details: pt has none  Skilled Therapeutic Intervention Evaluation completed (see details above and below)  with education on PT POC and goals and individual treatment initiated with focus on activity tolerance, bed mobility, transfers, gait training, stair navigation, and pt/family education regarding daily therapy schedule, weekly team meetings, purpose of PT evaluation, and other CIR information. Pt received supine in bed with her son present and able to interpret during session. Pt demonstrates impaired motor planning/processing and unable to follow directions to complete MMT assessment. Supine>sit with supervision. L stand pivot to w/c with R HHA and CGA for steadying.  Transported to/from gym in w/c for time management and energy conservation. Gait training ~169f with R HHA and CGA/min assist for balance - demonstrates very slow gait speed with decreased B LE step lengths and foot clearances. Pt requires prolonged seated rest break after ambulation due to fatigue - denies SOB. Ascended/descended 4 steps using B HRs with pt self-selecting reciprocal pattern on ascent - pt reports L calf muscle/Achille's pain ascending stairs (anticipate due to over activation of muscles) therefore cued pt through lateral descent leading with L LE using B UE support on R HR - min assist for balance throughout. Transported to ortho gym. Ambulated ~184fup/down ramp with R HHA and pt reaching L UE towards railings with min assist for balance - pt does not functionally use R UE to support her during gait despite this being her dominant hand. Ambulatory simulated car transfer (sedan height) with min assist for balance. Pt reporting significant fatigue and requesting to return to room. Therapist provided pt with w/c cushion for pressure relief. Stand pivot to EOB with CGA for steadying and pt climbing forward onto bed demonstrating slight impaired safety awareness but pt able to perform this without LOB or unsteadiness.  Pt left supine in bed with needs in reach, bed alarm on, and her son present.   PT  Evaluation Precautions/Restrictions Precautions Precautions: Fall Precaution Comments: impaired vision Restrictions Weight Bearing Restrictions: No Pain Pain Assessment Pain Scale: 0-10 Pain Score: 3  Pain Type: Acute pain Pain Location: Throat Pain Orientation: Medial Pain Descriptors / Indicators: Aching Pain Intervention(s): Medication (See eMAR) Home Living/Prior Functioning Home Living Available Help at Discharge: Family;Available 24 hours/day(primarily pt's husband) Type of Home: House Home Access: Stairs to enter [employer] Steps: 1step-up into the  house Home Layout: Two level;Able to live on main level with bedroom/bathroom Alternate Level Stairs-Number of Steps: flight Bathroom Shower/Tub: Chiropodist: Standard Bathroom Accessibility: Yes  Lives With: Daughter;Spouse(husband, daughter & son-in-law with their 3 children) Prior Function Level of Independence: Independent with gait;Independent with homemaking with ambulation;Independent with transfers  Able to Take Stairs?: Yes Driving: No(doesn't have license) Vocation: Retired Leisure: Hobbies-yes (Comment) Comments: talk with family/friends on the phone and enjoys listening to [employer] Perception  Perception Perception: Impaired(right inattention versus other visual field or acuity deficit - to be further assessed) Praxis Praxis: Impaired Praxis Impairment Details: Initiation;Motor planning  Cognition Overall Cognitive Status: Impaired/Different from baseline Arousal/Alertness: Awake/alert Orientation Level: Oriented to person;Oriented to place;Disoriented to situation;Disoriented to time("hospital" reports situation "I was really sick and loss consciousness" with cuing able to recall it is May and aware of year) Attention: Focused;Sustained Focused Attention: Impaired Sustained Attention: Impaired Memory: Impaired Awareness: Impaired Problem Solving: Impaired Problem  Solving Impairment: Functional basic;Verbal basic Behaviors: Impulsive Safety/Judgment: Impaired Comments: right inattention versus other visual field or acuity deficits Sensation Sensation Light Touch: Appears Intact Hot/Cold: Not tested Proprioception: Appears Intact Stereognosis: Not tested Coordination Gross Motor Movements are Fluid and Coordinated: No Fine Motor Movements are Fluid and Coordinated: No Coordination and Movement Description: impaired due to generalized weakness (R LE>L LE), impaired motor planning, and impaired balance Finger Nose Finger Test: slight decreased smoothness and accuracy on R side Heel Shin Test: difficulty understanding instructions - mild incoordination noticed with L LE and weakness resulting in difficulty performing with R LE Motor  Motor Motor: Hemiplegia Motor - Skilled Clinical Observations: mild R hemiplegia  Mobility Bed Mobility Bed Mobility: Sit to Supine;Supine to Sit Supine to Sit: Supervision/Verbal cueing Sit to Supine: Supervision/Verbal cueing Transfers Transfers: Sit to Stand;Stand Pivot Transfers;Stand to Sit Sit to Stand: Minimal Assistance - Patient > 75% Stand to Sit: Minimal Assistance - Patient > 75% Stand Pivot Transfers: Minimal Assistance - Patient > 75% Stand Pivot Transfer Details: Tactile cues for weight shifting;Tactile cues for sequencing;Tactile cues for initiation;Verbal cues for technique;Verbal cues for sequencing Transfer (Assistive device): 1 person hand held assist Locomotion  Gait Ambulation: Yes Gait Assistance: Minimal Assistance - Patient > 75% Gait Distance (Feet): 143 Feet Assistive device: 1 person hand held assist Gait Assistance Details: Tactile cues for weight shifting;Tactile cues for initiation;Tactile cues for posture;Tactile cues for sequencing Gait Gait: Yes Gait Pattern: Impaired Gait Pattern: Decreased step length - left;Decreased step length - right;Decreased stride length;Decreased  hip/knee flexion - right;Decreased hip/knee flexion - left;Narrow base of support Gait velocity: decreased Stairs / Additional Locomotion Stairs: Yes Stairs Assistance: Minimal Assistance - Patient > 75% Stair Management Technique: Two rails Number of Stairs: 4 Height of Stairs: 6 Ramp: Minimal Assistance - Patient >75% Wheelchair Mobility Wheelchair Mobility: No  Trunk/Postural Assessment  Cervical Assessment Cervical Assessment: Within Functional Limits Thoracic Assessment Thoracic Assessment: Exceptions to WFL(thoracic rounding) Lumbar Assessment Lumbar Assessment: Exceptions to WFL(posterior pelvid tilt in sitting) Postural Control Postural Control: Deficits on evaluation Postural Limitations: decreased  Balance Balance Balance Assessed: Yes Static Sitting Balance Static Sitting - Balance Support: Feet supported Static Sitting - Level of Assistance: 5: Stand by assistance Dynamic Sitting Balance Dynamic Sitting - Level of Assistance: 5: Stand by assistance Static Standing Balance Static Standing - Balance Support: During functional activity Static Standing - Level of Assistance: 4: Min assist Dynamic Standing Balance Dynamic Standing - Balance Support: During functional activity Dynamic Standing - Level of Assistance: 4: Min  assist;3: Mod assist Extremity Assessment      RLE Assessment RLE Assessment: Exceptions to [facility] General Strength Comments: pt unable to paritcipate in MMT due to inability to follow instructions - demonstrates movement at all joints with generalized weakness (R LE more impaired than L LE) LLE Assessment LLE Assessment: Exceptions to [facility] General Strength Comments: pt unable to paritcipate in MMT due to inability to follow instructions - demonstrates movement at all joints with generalized weakness    Refer to Care Plan for Long Term Goals  Recommendations for other services: None   Discharge Criteria: Patient will be discharged from PT if  patient refuses treatment 3 consecutive times without medical reason, if treatment goals not met, if there is a change in medical status, if patient makes no progress towards goals or if patient is discharged from hospital.  The above assessment, treatment plan, treatment alternatives and goals were discussed and mutually agreed upon: by patient and by family  Tawana Scale, PT, DPT 07/08/2019, 7:59 AM

## 2019-07-09 DIAGNOSIS — C7931 Secondary malignant neoplasm of brain: Secondary | ICD-10-CM | POA: Diagnosis not present

## 2019-07-09 DIAGNOSIS — R2689 Other abnormalities of gait and mobility: Secondary | ICD-10-CM | POA: Diagnosis not present

## 2019-07-09 MED ORDER — TRAZODONE HCL 50 MG PO TABS
50.0000 mg | ORAL_TABLET | Freq: Every day | ORAL | Status: DC
  Administered 2019-07-09 – 2019-07-11 (×2): 50 mg via ORAL
  Filled 2019-07-09 (×3): qty 1

## 2019-07-09 NOTE — Progress Notes (Signed)
[facility] PHYSICAL MEDICINE & REHABILITATION PROGRESS NOTE   Subjective/Complaints: Ambulating to sink with assistance of husband.  Has no complaints.   Review of Systems  Unable to perform ROS: Language   Objective:   No results found. Recent Labs    07/07/19 0644  WBC 12.5*  HGB 12.1  HCT 37.6  PLT 305   Recent Labs    07/07/19 0644  NA 138  K 4.0  CL 102  CO2 26  GLUCOSE 140*  BUN 24*  CREATININE 0.75  CALCIUM 9.5    Intake/Output Summary (Last 24 hours) at 07/09/2019 1511 Last data filed at 07/09/2019 1238 Gross per 24 hour  Intake 440 ml  Output --  Net 440 ml     Physical Exam: Vital Signs Blood pressure 106/70, pulse (!) 101, temperature 98.8 F (37.1 C), resp. rate 19, height 5\' 2"  (1.575 m), weight 59.6 kg, SpO2 100 %. Vitals reviewed. Constitutional: She appears well-developed and well-nourished.  HENT:  Head: Normocephalic and atraumatic.  No more evidence of thrush. Poor dental hygience Eyes: Right eye exhibits no discharge. Left eye exhibits no discharge. No scleral icterus.  Neck: No tracheal deviation present. No thyromegaly present.  Respiratory: Effort normal. No stridor. No respiratory distress.  GI: Soft. She exhibits no distension.  Musculoskeletal:     Comments: No edema or tenderness in extremities  Neurological: She is alert.  Patient is alert makes eye contact with examiner.  Examination limited by language barrier as well as cognition.   Unable to consistently follow commands Motor: Bilateral upper extremities appear to be 5/5 proximal distal Bilateral lower extremities, limited due to comprehension, but moving freely  Functional mobility: ambulating to sink with assistance of her husband Skin: Skin is warm and dry.  Psychiatric:  Appears to be blunted, delayed, slowed   Assessment/Plan: 1. Functional deficits secondary to metastatic adenocarcinoma to the brain which require 3+ hours per day of interdisciplinary therapy in  a comprehensive inpatient rehab setting.  Physiatrist is providing close team supervision and 24 hour management of active medical problems listed below.  Physiatrist and rehab team continue to assess barriers to discharge/monitor patient progress toward functional and medical goals  Care Tool:  Bathing    Body parts bathed by patient: Front perineal area         Bathing assist Assist Level: Independent     Upper Body Dressing/Undressing Upper body dressing   What is the patient wearing?: Hospital gown only(jacket)    Upper body assist Assist Level: Independent    Lower Body Dressing/Undressing Lower body dressing      What is the patient wearing?: Underwear/pull up     Lower body assist Assist for lower body dressing: Independent     Toileting Toileting    Toileting assist Assist for toileting: Contact Guard/Touching assist     Transfers Chair/bed transfer  Transfers assist     Chair/bed transfer assist level: Contact Guard/Touching assist     Locomotion Ambulation   Ambulation assist      Assist level: Contact Guard/Touching assist Assistive device: Hand held assist Max distance: 150ft   Walk 10 feet activity   Assist     Assist level: Minimal Assistance - Patient > 75% Assistive device: Hand held assist   Walk 50 feet activity   Assist    Assist level: Minimal Assistance - Patient > 75% Assistive device: Hand held assist    Walk 150 feet activity   Assist    Assist level: Minimal  Assistance - Patient > 75% Assistive device: Hand held assist    Walk 10 feet on uneven surface  activity   Assist     Assist level: Minimal Assistance - Patient > 75% Assistive device: Hand held assist   Wheelchair     Assist Will patient use wheelchair at discharge?: No             Wheelchair 50 feet with 2 turns activity    Assist            Wheelchair 150 feet activity     Assist          Blood pressure  106/70, pulse (!) 101, temperature 98.8 F (37.1 C), resp. rate 19, height 5\' 2"  (1.575 m), weight 59.6 kg, SpO2 100 %.    Medical Problem List and Plan: 1.  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  Status post left frontal craniotomy tumor resection 06/29/2019.  Patient will follow up outpatient oncology services Dr. Marin Olp as well as radiation oncology Dr. Mickeal Skinner             -patient may not shower             -ELOS/Goals: 8-13 days/supervision/Min A             Continue CIR.  2.  Antithrombotics: -DVT/anticoagulation: Subcutaneous Lovenox             -antiplatelet therapy: N/A 3. Pain Management: Tylenol as needed             Monitor headaches with increased exertion 4. Mood: Provide emotional support             -antipsychotic agents: N/A 5. Neuropsych: This patient is not fully capable of making decisions on her own behalf. 6. Skin/Wound Care: Routine skin checks 7. Fluids/Electrolytes/Nutrition: Routine in and outs.  CMP ordered. Ensure.  8.  Seizure disorder.  Continue Keppra 1000 mg twice daily.  EEG negative 9.  Oral thrush.  Continue Diflucan. Completed Nystatin. No evidence of remaining thrush on exam but will continue Diflucan given persistent throat pain (see #12).  10.  Tachycardia.             ECG on 5/3 showing atrial flutter, await official read             Repeat ECG ordered 11.  Leukocytosis-likely secondary to steroids             CBC ordered. Trending downward.  12. Throat pain: prn magic mouthwash with lidocaine. See #9.  13. Disposition: Reviewed oncology plan with son.   LOS: 2 days A FACE TO FACE EVALUATION WAS PERFORMED  Martha Clan P Altheria Shadoan 07/09/2019, 3:11 PM

## 2019-07-10 ENCOUNTER — Inpatient Hospital Stay (HOSPITAL_COMMUNITY): Payer: Medicaid Other

## 2019-07-10 ENCOUNTER — Other Ambulatory Visit: Payer: Self-pay

## 2019-07-10 ENCOUNTER — Inpatient Hospital Stay (HOSPITAL_COMMUNITY): Payer: Medicaid Other | Admitting: Occupational Therapy

## 2019-07-10 ENCOUNTER — Inpatient Hospital Stay: Payer: Medicare Other | Attending: Neurological Surgery

## 2019-07-10 ENCOUNTER — Other Ambulatory Visit: Payer: Self-pay | Admitting: Radiation Therapy

## 2019-07-10 ENCOUNTER — Telehealth: Payer: Self-pay | Admitting: Radiation Therapy

## 2019-07-10 DIAGNOSIS — R569 Unspecified convulsions: Secondary | ICD-10-CM | POA: Diagnosis not present

## 2019-07-10 DIAGNOSIS — C7949 Secondary malignant neoplasm of other parts of nervous system: Secondary | ICD-10-CM

## 2019-07-10 DIAGNOSIS — C342 Malignant neoplasm of middle lobe, bronchus or lung: Secondary | ICD-10-CM | POA: Insufficient documentation

## 2019-07-10 DIAGNOSIS — R2689 Other abnormalities of gait and mobility: Secondary | ICD-10-CM | POA: Diagnosis not present

## 2019-07-10 DIAGNOSIS — B370 Candidal stomatitis: Secondary | ICD-10-CM | POA: Diagnosis not present

## 2019-07-10 DIAGNOSIS — C3490 Malignant neoplasm of unspecified part of unspecified bronchus or lung: Secondary | ICD-10-CM | POA: Diagnosis not present

## 2019-07-10 DIAGNOSIS — C7931 Secondary malignant neoplasm of brain: Secondary | ICD-10-CM

## 2019-07-10 LAB — COMPREHENSIVE METABOLIC PANEL
ALT: 58 U/L — ABNORMAL HIGH (ref 0–44)
AST: 27 U/L (ref 15–41)
Albumin: 3.5 g/dL (ref 3.5–5.0)
Alkaline Phosphatase: 70 U/L (ref 38–126)
Anion gap: 11 (ref 5–15)
BUN: 22 mg/dL (ref 8–23)
CO2: 23 mmol/L (ref 22–32)
Calcium: 9.5 mg/dL (ref 8.9–10.3)
Chloride: 103 mmol/L (ref 98–111)
Creatinine, Ser: 0.68 mg/dL (ref 0.44–1.00)
GFR calc Af Amer: >60 mL/min (ref >60)
GFR calc non Af Amer: >60 mL/min (ref >60)
Glucose, Bld: 127 mg/dL — ABNORMAL HIGH (ref 70–99)
Potassium: 3.9 mmol/L (ref 3.5–5.1)
Sodium: 137 mmol/L (ref 135–145)
Total Bilirubin: 0.7 mg/dL (ref 0.3–1.2)
Total Protein: 7.3 g/dL (ref 6.5–8.1)

## 2019-07-10 LAB — CBC WITH DIFFERENTIAL/PLATELET
Abs Immature Granulocytes: 0.08 10*3/uL — ABNORMAL HIGH (ref 0.00–0.07)
Basophils Absolute: 0 10*3/uL (ref 0.0–0.1)
Basophils Relative: 0 %
Eosinophils Absolute: 0.2 10*3/uL (ref 0.0–0.5)
Eosinophils Relative: 1 %
HCT: 36.2 % (ref 36.0–46.0)
Hemoglobin: 11.9 g/dL — ABNORMAL LOW (ref 12.0–15.0)
Immature Granulocytes: 1 %
Lymphocytes Relative: 20 %
Lymphs Abs: 2.1 10*3/uL (ref 0.7–4.0)
MCH: 30.9 pg (ref 26.0–34.0)
MCHC: 32.9 g/dL (ref 30.0–36.0)
MCV: 94 fL (ref 80.0–100.0)
Monocytes Absolute: 0.6 10*3/uL (ref 0.1–1.0)
Monocytes Relative: 6 %
Neutro Abs: 7.7 10*3/uL (ref 1.7–7.7)
Neutrophils Relative %: 72 %
Platelets: 348 10*3/uL (ref 150–400)
RBC: 3.85 MIL/uL — ABNORMAL LOW (ref 3.87–5.11)
RDW: 13.6 % (ref 11.5–15.5)
WBC: 10.7 10*3/uL — ABNORMAL HIGH (ref 4.0–10.5)
nRBC: 0 % (ref 0.0–0.2)

## 2019-07-10 NOTE — Progress Notes (Signed)
[facility] PHYSICAL MEDICINE & REHABILITATION PROGRESS NOTE   Subjective/Complaints: Pt up in gym. Anxious to get home. Not sleeping well.   ROS: limited due to language/communication   Objective:   No results found. Recent Labs    07/10/19 0611  WBC 10.7*  HGB 11.9*  HCT 36.2  PLT 348   Recent Labs    07/10/19 0611  NA 137  K 3.9  CL 103  CO2 23  GLUCOSE 127*  BUN 22  CREATININE 0.68  CALCIUM 9.5    Intake/Output Summary (Last 24 hours) at 07/10/2019 0925 Last data filed at 07/10/2019 0827 Gross per 24 hour  Intake 460 ml  Output --  Net 460 ml     Physical Exam: Vital Signs Blood pressure 139/77, pulse (!) 105, temperature 98.8 F (37.1 C), resp. rate 16, height 5\' 2"  (1.575 m), weight 53.2 kg, SpO2 100 %. Constitutional: No distress . Vital signs reviewed. HEENT: EOMI, oral membranes moist Neck: supple Cardiovascular: RRR without murmur. No JVD    Respiratory/Chest: CTA Bilaterally without wheezes or rales. Normal effort    GI/Abdomen: BS +, non-tender, non-distended Ext: no clubbing, cyanosis, or edema Psych: flat but cooperative Musculoskeletal:     Comments: No edema or tenderness in extremities  Neurological: She is alert.  With son translating she follows basic commands. Slow to initiate.  Motor: Bilateral upper extremities appear to be 4/5 as are LE's.  Skin: Skin is warm and dry. Scalp CDI    Assessment/Plan: 1. 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  Physiatrist is providing close team supervision and 24 hour management of active medical problems listed below.  Physiatrist and rehab team continue to assess barriers to discharge/monitor patient progress toward functional and medical goals  Care Tool:  Bathing    Body parts bathed by patient: Front perineal area         Bathing assist Assist Level:  Independent     Upper Body Dressing/Undressing Upper body dressing   What is the patient wearing?: Hospital gown only    Upper body assist Assist Level: Independent    Lower Body Dressing/Undressing Lower body dressing      What is the patient wearing?: Underwear/pull up     Lower body assist Assist for lower body dressing: Independent     Toileting Toileting    Toileting assist Assist for toileting: Contact Guard/Touching assist     Transfers Chair/bed transfer  Transfers assist     Chair/bed transfer assist level: Contact Guard/Touching assist     Locomotion Ambulation   Ambulation assist      Assist level: Contact Guard/Touching assist Assistive device: Hand held assist Max distance: 16ft   Walk 10 feet activity   Assist     Assist level: Minimal Assistance - Patient > 75% Assistive device: Hand held assist   Walk 50 feet activity   Assist    Assist level: Minimal Assistance - Patient > 75% Assistive device: Hand held assist    Walk 150 feet activity   Assist    Assist level: Minimal Assistance - Patient > 75% Assistive device: Hand held assist    Walk 10 feet on uneven surface  activity   Assist     Assist level: Minimal Assistance - Patient > 75% Assistive device: Hand held assist   Wheelchair     Assist Will patient use wheelchair at discharge?: No  Wheelchair 50 feet with 2 turns activity    Assist            Wheelchair 150 feet activity     Assist          Blood pressure 139/77, pulse (!) 105, temperature 98.8 F (37.1 C), resp. rate 16, height 5\' 2"  (1.575 m), weight 53.2 kg, SpO2 100 %.    Medical Problem List and Plan: 1.  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  Status post left frontal craniotomy tumor resection 06/29/2019.  Patient will follow up outpatient oncology services Dr. Marin Olp as well as  radiation oncology Dr. Mickeal Skinner             -patient may shower             -ELOS/Goals: probably more like 5-7 days/supervision/Min A             Continue CIR. Pt anxious to get home. Therapy aware. Has a lot of family help at home  -per rad-onc, MRI prior to discharge with XRT as outpt 2.  Antithrombotics: -DVT/anticoagulation: Subcutaneous Lovenox             -antiplatelet therapy: N/A 3. Pain Management: Tylenol as needed             Monitor headaches with increased exertion 4. Mood: Provide emotional support             -antipsychotic agents: N/A 5. Neuropsych: This patient is not fully capable of making decisions on her own behalf. 6. Skin/Wound Care: Routine skin checks 7. Fluids/Electrolytes/Nutrition: Routine in and outs.  CMP ordered. Ensure.  8.  Seizure disorder.  Continue Keppra 1000 mg twice daily.  EEG negative 9.  Oral thrush.  Continue Diflucan. Completed Nystatin. Can continue diflucan to complete course 10.  Tachycardia.             ECG on 5/3 showing atrial flutter              will repeat EKG today 5/17. Regular on exam 11.  Leukocytosis-likely secondary to steroids             CBC ordered. Trending downward : 10.7 5/17  12. Throat pain: prn magic mouthwash with lidocaine. See #9.   -5/17 seems improved.  .   LOS: 3 days A FACE TO FACE EVALUATION WAS PERFORMED  Meredith Staggers 07/10/2019, 9:25 AM

## 2019-07-10 NOTE — Care Management (Addendum)
[facility] Individual Statement of Services  Patient Name:  Sara Schroeder  Date:  07/10/2019  Welcome to the [facility].  Our goal is to provide you with an individualized program based on your diagnosis and situation, designed to meet your specific needs.  With this comprehensive rehabilitation program, you will be expected to participate in at least 3 hours of rehabilitation therapies Monday-Friday, with modified therapy programming on the weekends.  Your rehabilitation program will include the following services:  Physical Therapy (PT), Occupational Therapy (OT), 24 hour per day rehabilitation nursing, Therapeutic Recreaction (TR), Psychology, Neuropsychology, Care Coordinator, Rehabilitation Medicine, Nutrition Services, Pharmacy Services and Other  Weekly team conferences will be held on Tuesdays to discuss your progress.  Your Inpatient Rehabilitation Care Coordinator will talk with you frequently to get your input and to update you on team discussions.  Team conferences with you and your family in attendance may also be held.  Expected length of stay: 5-7 days    Overall anticipated outcome: Supervision  Depending on your progress and recovery, your program may change. Your Inpatient Rehabilitation Care Coordinator will coordinate services and will keep you informed of any changes. Your Inpatient Rehabilitation Care Coordinator's name and contact numbers are listed  below.  The following services may also be recommended but are not provided by the [facility] will be made to provide these services after discharge if needed.  Arrangements include referral to agencies that provide these services.  Your insurance has been verified to be:  Medicaid  Your primary doctor is:  [employer]  Pertinent information will be shared with your doctor and your insurance company.  Inpatient Rehabilitation Care Coordinator:  Cathleen Corti [phone] or ([phone]  Information discussed with and copy given to patient by: Rana Snare, 07/10/2019, 10:46 AM

## 2019-07-10 NOTE — Progress Notes (Signed)
Occupational Therapy Session Note  Patient Details  Name: Sara Schroeder MRN: [id number] Date of Birth: 12-Oct-1950  Today's Date: 07/10/2019 OT Individual Time: 0845-1000 OT Individual Time Calculation (min): 75 min    Short Term Goals: Week 1:  OT Short Term Goal 1 (Week 1): LTG=STG 2/2 ELOS  Skilled Therapeutic Interventions/Progress Updates:    Pt greeted semi-reclined in bed after just finishing PT session. Pt wanted to shower today and verbally cleared by MD. Pt ambulated into bathroom using RW and supervision with verbal cues for RW management. Pt sat on tub bench and doffed clothing without assist. Pt completed bathing with supervision and only assist to wash hair just so she did not hit incision. Dressing completed from wc with supervision. OT assisted with brushing hair 2/2 tangles and to avoid incision. Pt ambulated 20 feet, then reported max fatigue, so OT pushed pt in wc to therapy apartment. Practiced tub bench transfer with pt and son with son providing supervision/CGA without RW to transfer to tub bench. OT educated on energy conservation techniques as well. Pt blow dried hair sitting at EOB with min A 2/2 UE fatigue after 2 minutes. Pt left seated EOB taking meds from nursing.    Therapy Documentation Precautions:  Precautions Precautions: Fall Precaution Comments: impaired vision Restrictions Weight Bearing Restrictions: No Pain: None/denies pain  Therapy/Group: Individual Therapy  Valma Cava 07/10/2019, 9:40 AM

## 2019-07-10 NOTE — Progress Notes (Signed)
Inpatient Rehabilitation  Patient information reviewed and entered into eRehab system by Becky Colan M. Tariya Morrissette, M.A., CCC/SLP, PPS Coordinator.  Information including medical coding, functional ability and quality indicators will be reviewed and updated through discharge.    

## 2019-07-10 NOTE — Progress Notes (Signed)
Pt refused Trazodone, stating that she does not need it for sleep. Per daughter, pt coughed multiple times during the night. Pt was administered pro-stat and believes that's the reason for the coughing. Daughter alerted RN that pt after 2000 meds were administered, pt coughed up her meds. Meds were broken in half and re-administered with applesauce.

## 2019-07-10 NOTE — Plan of Care (Signed)
  Problem: RH SAFETY Goal: RH STG ADHERE TO SAFETY PRECAUTIONS W/ASSISTANCE/DEVICE Description: STG Adhere to Safety Precautions With Assistance/Device. Outcome: Progressing Goal: RH STG DECREASED RISK OF FALL WITH ASSISTANCE Description: STG Decreased Risk of Fall With Assistance. Outcome: Progressing   Problem: RH COGNITION-NURSING Goal: RH STG USES MEMORY AIDS/STRATEGIES W/ASSIST TO PROBLEM SOLVE Description: STG Uses Memory Aids/Strategies With Assistance to Problem Solve. Outcome: Progressing Goal: RH STG ANTICIPATES NEEDS/CALLS FOR ASSIST W/ASSIST/CUES Description: STG Anticipates Needs/Calls for Assist With Assistance/Cues. Outcome: Progressing

## 2019-07-10 NOTE — Progress Notes (Signed)
Physical Therapy Session Note  Patient Details  Name: Sara Schroeder MRN: [id number] Date of Birth: April 23, 1950  Today's Date: 07/10/2019 PT Individual Time: 6378-5885 and 0277-4128 PT Individual Time Calculation (min): 46 min and 48 min   Short Term Goals: Week 1:  PT Short Term Goal 1 (Week 1): = to LTGs based on ELOS  Skilled Therapeutic Interventions/Progress Updates:     Session 1: Patient in bed with her husband and son in the room upon PT arrival. Patient alert and agreeable to PT session. Patient denied pain during session. Reports that she has had intermittent mild head pain (or headaches, unsure due to language barrier), denied any throughout session. Did report that she did not sleep well last night, family reports this was also common at baseline. Patient and her family expressed that she would like to be home soon, also reported that she will have 24/7 assistance from family at home. Patient's son provided interpretation throughout session due to decreased access to patient's language through interpretation services. Patient's son also participated in hands-on training throughout session.   Therapeutic Activity: Bed Mobility: Patient performed supine to/from sit independently in a flat bed without use of bed rails.  Transfers: Patient performed sit to/from stand x6 with and without a RW with close supervision for safety. Provided verbal cues for hand placement on RW and reaching back to sit for safety. Patient was continent of bladder x2 during session. Performed toilet transfer with supervision using grab bar and peri-care and LB dressing independently during toileting.   Gait Training:  Patient ambulated 15 feet to/from the bathroom without an AD with CGA-min A and 150 feet x2 using RW with CGA progressing to close supervision. Ambulated with decreased gait speed, decreased step length and height, increased B hip and knee flexion in stance, forward trunk lean, and downward head gaze.  Provided verbal cues for erect posture, looking ahead, increased step height for safety, and avoiding objects due to decreased visual acuity. Demonstrated safe guarding technique to patient's son and he provided close supervision on second long gait trail. Patient ascended/descended 1-4" step x2 using RW with CGA and min A for AD managment. Performed step-to gait pattern leading with L while ascending and R while descending. Provided cues for technique and sequencing.    Patient in bed with her son at bedside at end of session with breaks locked, bed alarm set, and all needs within reach.   Session 2: Patient in bed with her son in the room upon PT arrival. Patient alert and agreeable to PT session. Patient denied pain during session. Initially patient requested to rest due to increased fatigue and poor sleep last night. PT provided encouragement for patient to participate in preparation for d/c on Wednesday, per team discussion. Patient was then agreeable to performing a balance assessment and gait training before taking a nap. Patient's son provided interpretation throughout session due to decreased access to patient's language through interpretation services. Patient's son also participated in hands-on training throughout session.   Visual Assessment: Patient with decreased ability to follow cues for formal testing, however, she did indicate blurred vision in both eyes that has been present, no worse no better, since surgery, denied diploplia, decreased peripheral vision on R (may not be valid due to poor ability to follow cues).   Therapeutic Activity: Bed Mobility: Patient performed supine to/from sit as above. Transfers: Patient performed sit to/from stand x9 with supervision with and without RW. Provided verbal cues for hand placement on RW and  reaching back to sit for safety.  Gait Training:  Patient ambulated 110 feet x2 using RW with close supervision for safety. Ambulated as described  above, patient son provided assistance on second gait trial demonstrating safe guarding technique. Provided verbal cues for increased speed, and patient's son provided cues for staying close to the RW and avoiding objects x2 following PT's earlier demonstration.  Neuromuscular Re-ed: Patient performed the Berg Balance Scale to assess fall risk with static and dynamic balance activities: Patient demonstrates increased fall risk as noted by score of 40/56 on Berg Balance Scale.  (<36= high risk for falls, close to 100%; 37-45 significant >80%; 46-51 moderate >50%; 52-55 lower >25%)  Educated patient and her son on significance and interpretation of score. Educated on patient using RW at all times when standing or performing gait activities and to have a family member with her for safety due to increased risk of falls. Also educated on consequences of falls if patient were to hit her head after brain surgery and discussed importance of safety recommendations. Patient and family receptive and in agreement with PT recommendations.   Discussed d/c planning for Wednesday, d/c procedure and timeline, goals for family education and mobility assessment tomorrow for her last day of therapy, and car transfers, recommended that patient be picked up in a low sedan if possible for patient's safety.   Patient in bed with her son at bedside at end of session with breaks locked, bed alarm set, and all needs within reach.    Therapy Documentation Precautions:  Precautions Precautions: Fall Precaution Comments: impaired vision Restrictions Weight Bearing Restrictions: No  Balance: Standardized Balance Assessment Standardized Balance Assessment: Berg Balance Test Berg Balance Test Sit to Stand: Able to stand without using hands and stabilize independently Standing Unsupported: Able to stand safely 2 minutes Sitting with Back Unsupported but Feet Supported on Floor or Stool: Able to sit safely and securely 2  minutes Stand to Sit: Sits safely with minimal use of hands Transfers: Able to transfer safely, minor use of hands Standing Unsupported with Eyes Closed: Able to stand 10 seconds safely Standing Ubsupported with Feet Together: Able to place feet together independently and stand for 1 minute with supervision From Standing, Reach Forward with Outstretched Arm: Reaches forward but needs supervision From Standing Position, Pick up Object from Floor: Able to pick up shoe, needs supervision From Standing Position, Turn to Look Behind Over each Shoulder: Turn sideways only but maintains balance Turn 360 Degrees: Able to turn 360 degrees safely but slowly Standing Unsupported, Alternately Place Feet on Step/Stool: Able to complete 4 steps without aid or supervision Standing Unsupported, One Foot in Front: Able to take small step independently and hold 30 seconds Standing on One Leg: Tries to lift leg/unable to hold 3 seconds but remains standing independently Total Score: 40/56    Therapy/Group: Individual Therapy  Taliana Mersereau L Latavion Halls PT, DPT  07/10/2019, 12:38 PM

## 2019-07-10 NOTE — Progress Notes (Signed)
Patient c/o not being able to sleep well. Son requesting for sleep medicine as melatonin does not help. RN asked son what she takes at home and he does not remember but mentions that patient is on sleep medicine at home. MD notified. Trazodone given. Per husband, patient still not sleeping well.

## 2019-07-10 NOTE — IPOC Note (Signed)
Overall Plan of Care [facility]) Patient Details Name: Anaysha Andre MRN: [id number] DOB: 10-09-1950  Admitting Diagnosis: Metastatic adenocarcinoma to brain [facility])  Hospital Problems: Principal Problem:   Metastatic adenocarcinoma to brain [facility]) Active Problems:   Metastatic adenocarcinoma ([place])     Functional Problem List: Nursing Bladder, Bowel, Edema, Endurance, Medication Management, Nutrition, Pain, Perception, Safety, Skin Integrity  PT Balance, Safety, Behavior, Edema, Skin Integrity, Endurance, Motor, Nutrition, Pain, Perception  OT Balance, Cognition, Endurance, Motor, Perception  SLP Cognition, Linguistic  TR         Basic ADL's: OT Grooming, Bathing, Dressing, Toileting     Advanced  ADL's: OT       Transfers: PT Bed Mobility, Bed to Chair, Car, Furniture, Floor  OT Toilet, Tub/Shower     Locomotion: PT Ambulation, Stairs     Additional Impairments: OT Fuctional Use of Upper Extremity  SLP Social Cognition, Communication comprehension, expression Attention, Awareness  TR      Anticipated Outcomes Item Anticipated Outcome  Self Feeding    Swallowing      Basic self-care  supervision  Toileting  supervision   Bathroom Transfers supervision  Bowel/Bladder  patient will be continent of bowel and bladder with min assist  Transfers  supervision  Locomotion  supervision  Communication  Min assist  Cognition     Pain  pain less than or equal to 4/10 with min assist  Safety/Judgment  free from falls/injury and making appropriate safety decisions with min assist   Therapy Plan: PT Intensity: Minimum of 1-2 x/day ,45 to 90 minutes PT Frequency: 5 out of 7 days PT Duration Estimated Length of Stay: ~7 days OT Intensity: Minimum of 1-2 x/day, 45 to 90 minutes OT Frequency: 5 out of 7 days OT Duration/Estimated Length of Stay: 5-7 days SLP Intensity: Minumum of 1-2 x/day, 30 to 90 minutes SLP Frequency: 3 to 5 out of 7 days SLP Duration/Estimated Length  of Stay: 7 days   Due to the current state of emergency, patients may not be receiving their 3-hours of Medicare-mandated therapy.   Team Interventions: Nursing Interventions Patient/Family Education, Bladder Management, Bowel Management, Disease Management/Prevention, Pain Management, Medication Management, Skin Care/Wound Management, Cognitive Remediation/Compensation, Discharge Planning  PT interventions Ambulation/gait training, Community reintegration, DME/adaptive equipment instruction, Neuromuscular re-education, Psychosocial support, Stair training, UE/LE Strength taining/ROM, Training and development officer, Discharge planning, Functional electrical stimulation, Pain management, Skin care/wound management, Therapeutic Activities, UE/LE Coordination activities, Cognitive remediation/compensation, Disease management/prevention, Functional mobility training, Patient/family education, Splinting/orthotics, Therapeutic Exercise, Visual/perceptual remediation/compensation  OT Interventions Balance/vestibular training, Cognitive remediation/compensation, Community reintegration, Discharge planning, DME/adaptive equipment instruction, Functional mobility training, Neuromuscular re-education, Patient/family education, Therapeutic Activities, Therapeutic Exercise, Self Care/advanced ADL retraining, Psychosocial support, UE/LE Strength taining/ROM, UE/LE Coordination activities, Visual/perceptual remediation/compensation  SLP Interventions Cognitive remediation/compensation, English as a second language teacher, Patient/family education, Environmental controls, Internal/external aids, Speech/Language facilitation  TR Interventions    SW/CM Interventions Psychosocial Support, Patient/Family Education, Discharge Planning   Barriers to Discharge MD  Medical stability  Nursing      PT Medical stability, Pending chemo/radiation    OT      SLP      SW       Team Discharge Planning: Destination: PT-Home ,OT- Home ,  SLP-Home Projected Follow-up: PT-Home health PT, 24 hour supervision/assistance, OT-  Home health OT, SLP-Home Health SLP, Outpatient SLP, 24 hour supervision/assistance Projected Equipment Needs: PT-To be determined, OT- To be determined, SLP-None recommended by SLP Equipment Details: PT-pt has none, OT-  Patient/family involved in discharge planning: PT- Patient,  OT-Patient, Family member/caregiver, SLP-Patient, Family member/caregiver  MD ELOS: 6 days Medical Rehab Prognosis:  Excellent Assessment: The patient has been admitted for CIR therapies with the diagnosis of metastatic lung cancer to the brain. The team will be addressing functional mobility, strength, stamina, balance, safety, adaptive techniques and equipment, self-care, bowel and bladder mgt, patient and caregiver education, NMR, cognition, communication, community reentry. Goals have been set at supervision for self-care and mobility and supervision /min assist for cognition and communication .   Due to the current state of emergency, patients may not be receiving their 3 hours per day of Medicare-mandated therapy.    Meredith Staggers, MD, FAAPMR      See Team Conference Notes for weekly updates to the plan of care

## 2019-07-10 NOTE — Telephone Encounter (Signed)
I called the patient's son, Sara Schroeder, once I noticed that she has been scheduled at both [facility] in [place] and [facility] in [place] for oncology care. I wanted to make sure that the patient is still wanting to proceed with treatment as discussed with Dr. Ida Rogue PA-C, Shona Simpson last week. Sara Schroeder was a little confused and overwhelmed about all the information for his mother. He said that his mother is very anxious, she suffers from depression and is scared of going to the doctors or having any medical procedures done. They have not shared with their mother that she has been diagnosed with cancer or explained to her what treatments are being discussed because they feel it will scare her and prevent her from getting better. Sara Schroeder wants to slow things down and see that his mother is starting to improve rather than getting her all worked up about more medical treatments or appointments.   As for the visits with Dr. Donette Larry office, he is not sure what they will do as her other doctors are in the [place] area. Sara Schroeder expressed concerns about when his mother is discharged. He worries that she will not want to go to visits or even take her medications as prescribed due to her anxiety and overall fear of treatments. He asked if there are services that provide aid in those situations at home to ensure a patients is taking her medications. He inquired about social workers here at the hospital and has asked that they reach out to him to share what options his mother may have after discharge, including palliative care and home health aid.   He has also asked that Shona Simpson, PA-C call him back to further discuss the treatment options for his mother including waiting until she is getting better and has regained her strength before moving forward with anything else. I will share this with Bryson Ha, Dr. Lisbeth Renshaw and Dr. Zada Finders.   Mont Dutton R.T.(R)(T) Radiation Special Procedures Navigator

## 2019-07-10 NOTE — Progress Notes (Addendum)
Patient ID: Sara Schroeder, female   DOB: 1950/12/08, 69 y.o.   MRN: [id number]   SW met with pt and pt son Sara Schroeder [phone]) to introduce self, explain role, complete assessment, and discuss discharge process. Son Sara Schroeder translated for his mother. Sara Schroeder was unsure if he or his brother Sara Schroeder will be here tomorrow so SW could provide updates from team conference.   *Medical team states pt can d/c on Wednesday 5/19. DMe needed: 3in1 BSC, TTB, RW (youth) and transport w/c. Sw ordered DME with [employer] via parachute.   Sara Schroeder, MSW, [place] Office: [phone] Cell: [phone] Fax: [phone]

## 2019-07-11 ENCOUNTER — Inpatient Hospital Stay (HOSPITAL_COMMUNITY): Payer: Medicaid Other | Admitting: Speech Pathology

## 2019-07-11 ENCOUNTER — Inpatient Hospital Stay (HOSPITAL_COMMUNITY): Payer: Medicaid Other

## 2019-07-11 ENCOUNTER — Inpatient Hospital Stay (HOSPITAL_COMMUNITY): Payer: Medicaid Other | Admitting: Occupational Therapy

## 2019-07-11 DIAGNOSIS — C7931 Secondary malignant neoplasm of brain: Secondary | ICD-10-CM | POA: Diagnosis not present

## 2019-07-11 DIAGNOSIS — R2689 Other abnormalities of gait and mobility: Secondary | ICD-10-CM | POA: Diagnosis not present

## 2019-07-11 DIAGNOSIS — R569 Unspecified convulsions: Secondary | ICD-10-CM | POA: Diagnosis not present

## 2019-07-11 MED ORDER — OMEPRAZOLE 20 MG PO CPDR: 20.0000 mg | DELAYED_RELEASE_CAPSULE | Freq: Every day | ORAL | 0 refills | Status: DC

## 2019-07-11 MED ORDER — PRAVASTATIN SODIUM 20 MG PO TABS: 20.0000 mg | ORAL_TABLET | Freq: Every evening | ORAL | 0 refills | Status: DC

## 2019-07-11 MED ORDER — FLUCONAZOLE 100 MG PO TABS: 100.0000 mg | ORAL_TABLET | Freq: Every day | ORAL | 0 refills | Status: DC

## 2019-07-11 MED ORDER — TRAZODONE HCL 50 MG PO TABS: 50.0000 mg | ORAL_TABLET | Freq: Every day | ORAL | 0 refills | Status: DC

## 2019-07-11 MED ORDER — ACETAMINOPHEN 325 MG PO TABS: 650.0000 mg | ORAL_TABLET | ORAL | Status: DC | PRN

## 2019-07-11 MED ORDER — LEVETIRACETAM 1000 MG PO TABS: 1000.0000 mg | ORAL_TABLET | Freq: Two times a day (BID) | ORAL | 0 refills | Status: DC

## 2019-07-11 MED ORDER — DOCUSATE SODIUM 100 MG PO CAPS: 100.0000 mg | ORAL_CAPSULE | Freq: Two times a day (BID) | ORAL | 0 refills | Status: AC

## 2019-07-11 NOTE — Progress Notes (Signed)
Occupational Therapy Discharge Summary  Patient Details  Name: Sara Schroeder MRN: [id number] Date of Birth: Nov 01, 1950  Today's Date: 07/11/2019 OT Individual Time: 0923-3007 OT Individual Time Calculation (min): 58 min   Pt greeted semi-reclined in bed and agreeable to OT treatment session. Pt declined bathing/dressing today 2/2 completing shower yesterday. Pt's son and husband present. OT educated son and spouse on cues for hand placement with sit<>stand and proper use of RW as pt often attempting to pull up on RW. Pt ambulated into bathroom w/ RW and supervision with verbal cues for RW management. Pt voided bladder and completed 3/3 toileting steps with supervision. Educated on RW placement at the sink to stand and wash hands. Pt took seated rest break, then ambulated to therapy apartment to practice tub bench transfer with family members and supervision. Worked on cognitive retraining and visual scanning with 2 color linear pattern on peg board. PT with difficulty understanding how the picture of the pattern correlated with the pegs on the board. OT graded activity by OT making 2 color pattern first, then having pt copy. OT graded up activity to then 3 color pattern, but pt continued to need mod/max verbal cues to complete. Dynavision activity focused on visual scanning and reaching. Pt returned to room and requested to return to bed. Family ambulated with pt back to room and pt left semi-reclined in bed with bed alarm on, family present, and needs met.   Patient has met 11 of 11 long term goals due to improved activity tolerance, improved balance, postural control, ability to compensate for deficits, functional use of  RIGHT upper and RIGHT lower extremity, improved attention, improved awareness and improved coordination.  Patient to discharge at overall Supervision level.  Patient's care partner is independent to provide the necessary physical and cognitive assistance at discharge.    Reasons goals not  met: n/a  Recommendation:  Patient will benefit from ongoing skilled OT services in home health setting to continue to advance functional skills in the area of BADL and Reduce care partner burden.  Equipment: tub transfer bench, 3-in-1  BSC  Reasons for discharge: treatment goals met and discharge from hospital  Patient/family agrees with progress made and goals achieved: Yes  OT Discharge Precautions/Restrictions  Precautions Precautions: Fall Restrictions Weight Bearing Restrictions: No Pain  denies pain ADL ADL Eating: Independent Grooming: Independent Upper Body Bathing: Supervision/safety Lower Body Bathing: Supervision/safety Upper Body Dressing: Supervision/safety Lower Body Dressing: Supervision/safety Toileting: Supervision/safety Toilet Transfer: Close supervision Tub/Shower Transfer: Close supervison Vision Baseline Vision/History: Wears glasses Wears Glasses: At all times Patient Visual Report: Blurring of vision Vision Assessment?: Yes Eye Alignment: Within Functional Limits Alignment/Gaze Preference: Within Defined Limits Tracking/Visual Pursuits: Requires cues, head turns, or add eye shifts to track Saccades: Additional head turns occurred during testing Perception  Perception: Impaired Spatial Orientation: bumps into objects while ambulating, unsure if visual or perceptual deficits contributing Praxis Praxis: Impaired Praxis Impairment Details: Motor planning Cognition Overall Cognitive Status: Impaired/Different from baseline Arousal/Alertness: Awake/alert Orientation Level: Oriented to person;Oriented to place;Oriented to time Focused Attention: Appears intact Sustained Attention: Impaired Sustained Attention Impairment: Functional basic;Verbal basic Memory: Impaired Memory Impairment: Storage deficit Awareness: Impaired Awareness Impairment: Emergent impairment Problem Solving: Impaired Problem Solving Impairment: Functional basic;Verbal  basic Behaviors: Impulsive Safety/Judgment: Impaired Sensation Sensation Light Touch: Appears Intact Coordination Gross Motor Movements are Fluid and Coordinated: No Fine Motor Movements are Fluid and Coordinated: No Coordination and Movement Description: decreased smoothness and accurcy, improved since eval Motor  Motor Motor: Hemiplegia Motor -  Skilled Clinical Observations: mild R hemiplegia Mobility  Bed Mobility Bed Mobility: Rolling Left Rolling Right: Independent Rolling Left: Independent Transfers Sit to Stand: Supervision/Verbal cueing Stand to Sit: Supervision/Verbal cueing  Trunk/Postural Assessment  Postural Control Postural Control: Deficits on evaluation Postural Limitations: decreased  Balance Balance Balance Assessed: Yes Static Sitting Balance Static Sitting - Balance Support: Feet supported Static Sitting - Level of Assistance: 7: Independent Dynamic Sitting Balance Dynamic Sitting - Level of Assistance: 7: Independent Static Standing Balance Static Standing - Balance Support: During functional activity Static Standing - Level of Assistance: 7: Independent Dynamic Standing Balance Dynamic Standing - Level of Assistance: 6: Modified independent (Device/Increase time) Extremity/Trunk Assessment RUE Assessment General Strength Comments: Grossly 4+/5 LUE Assessment LUE Assessment: Within Functional Limits   Daneen Schick Royalti Schauf 07/11/2019, 3:50 PM

## 2019-07-11 NOTE — Discharge Summary (Signed)
Physician Discharge Summary  Patient ID: Sara Schroeder MRN: [id number] DOB/AGE: 1950/11/13 69 y.o.  Admit date: 07/07/2019 Discharge date: 07/12/2019  Discharge Diagnoses:  Principal Problem:   Metastatic adenocarcinoma to brain [facility]) Active Problems:   Metastatic adenocarcinoma ([address]) DVT prophylaxis Seizure disorder Oral thrush Tachycardia Hyperlipidemia   Discharged Condition: Stable  Significant Diagnostic Studies: EEG  Result Date: 06/27/2019 Lora Havens, MD     06/27/2019  9:56 AM Patient Name: Sara Schroeder MRN: [id number] Epilepsy Attending: Lora Havens Referring Physician/Provider: Eliseo Gum, NP Date: 06/27/2019 Duration: 28.21 minutes Patient history: 69 year old female presented with seizure-like episode and was found to have left frontal lobe hemorrhagic mass.  EEG evaluate for seizures. Level of alertness: Comatose AEDs during EEG study: Keppra, propofol Technical aspects: This EEG study was done with scalp electrodes positioned according to the 10-20 International system of electrode placement. Electrical activity was acquired at a sampling rate of 500Hz  and reviewed with a high frequency filter of 70Hz  and a low frequency filter of 1Hz . EEG data were recorded continuously and digitally stored. Description: EEG showed an excessive amount of 15 to 18 Hz, sharply contoured beta activity with irregular morphology distributed symmetrically and diffusely.   Intermittent generalized background attenuation was also noted. Hyperventilation and photic stimulation were not performed. Abnormality -Excessive beta, generalized -Background attenuation, generalized IMPRESSION: This study is suggestive of severe to profound diffuse encephalopathy, nonspecific etiology but most likely secondary to sedation. No seizures or definite epileptiform discharges were seen throughout the recording. Sara Schroeder   CT ABDOMEN PELVIS WO CONTRAST  Result Date: 06/27/2019 CLINICAL DATA:  Brain mass. Lung  nodule. EXAM: CT CHEST, ABDOMEN AND PELVIS WITHOUT CONTRAST TECHNIQUE: Multidetector CT imaging of the chest, abdomen and pelvis was performed following the standard protocol without IV contrast. COMPARISON:  Chest radiograph yesterday. CT chest abdomen pelvis 03/01/2018 FINDINGS: CT CHEST FINDINGS Cardiovascular: Aortic atherosclerosis. No aortic aneurysm. Heart is normal in size. No pericardial effusion. Mediastinum/Nodes: Accurate assessment for hilar adenopathy is limited in the absence of IV contrast. There are no enlarged mediastinal lymph nodes. Endotracheal tube with tip above the carina. Enteric tube decompresses the esophagus. There is a 16 mm nodule that abuts the isthmus of the thyroid, likely unchanged from prior, on the previous exam portions of this region obscured by streak artifact from IV contrast. There is no axillary adenopathy. Lungs/Pleura: Spiculated right middle lobe pulmonary nodule measures 11 x 11 x 10 mm, only minimally increased in size from prior exam. Margins are irregular and spiculated with small spiculation extending to the pleural surface anteriorly, minimal pleural thickening. No other pulmonary nodule or mass. Dependent opacities in the lung bases favor atelectasis. Trace pleural thickening without significant effusion. Musculoskeletal: No blastic or evidence of destructive lytic lesion. Ordinary degenerative change in the spine. No obvious breast mass. CT ABDOMEN PELVIS FINDINGS Hepatobiliary: Lack of IV contrast limits assessment for focal liver lesion. Capsular calcification involving the dome. No evidence of focal hepatic lesion. Gallbladder physiologically distended, no calcified stone. No biliary dilatation. Pancreas: No ductal dilatation or inflammation. No evidence of focal lesion on this noncontrast exam. Spleen: Normal in size without focal abnormality. Adrenals/Urinary Tract: Slight adrenal thickening without renal nodule. No hydronephrosis. Mild prominence of both  renal collecting systems. No evidence of focal renal lesion on noncontrast exam. Urinary bladder is decompressed by Foley catheter. Stomach/Bowel: Enteric tube tip in the stomach. Contrast and fluid in the stomach without evidence of focal gastric mass. No small bowel wall thickening or inflammation.  No obstruction, administered enteric contrast reaches the colon. Normal contrast filled appendix. Moderate stool distally. Vascular/Lymphatic: Aortic atherosclerosis. No aortic aneurysm. No enlarged lymph nodes in the abdomen or pelvis, detailed assessment limited in the absence of IV contrast. Small calcified lymph node adjacent to the proximal sigmoid colon, suggesting prior granulomatous disease. Reproductive: Uterus and bilateral adnexa are unremarkable. No evidence of adnexal mass. Other: No ascites or free fluid. No omental thickening. Musculoskeletal: No blastic or destructive lytic lesions. Transitional lumbosacral anatomy. Degenerative disc disease at L4-L5. There are no acute or suspicious osseous abnormalities. Calcified granuloma in the subcutaneous soft tissues. No suspicious skin or subcutaneous lesion. IMPRESSION: 1. Spiculated right middle lobe pulmonary nodule measuring 11 x 11 x 10 mm, only minimally increased in size from prior exam. This is suspicious for primary bronchogenic malignancy. Evaluation of the hila is limited in the absence of IV contrast, allowing for this limitation, no evidence of nodal metastasis. 2. No other evidence of metastatic disease or primary malignancy in the chest, abdomen, or pelvis. 3. Exophytic 16 mm nodule from the thyroid isthmus, patient had previous thyroid uptake and scan at an outside institution that reported this nodule, however size was not specified, and those images are not available. No thyroid ultrasounds are available for review. Therefore, recommend thyroid US (ref: J Am Coll Radiol. 2015 Feb;12(2): 143-50), unless performed elsewhere. 4. Dependent  opacities in the lung bases favor atelectasis. Aortic Atherosclerosis (ICD10-I70.0). Electronically Signed   By: Keith Rake M.D.   On: 06/27/2019 16:56   CT Head Wo Contrast  Result Date: 06/26/2019 CLINICAL DATA:  Seizure EXAM: CT HEAD WITHOUT CONTRAST TECHNIQUE: Contiguous axial images were obtained from the base of the skull through the vertex without intravenous contrast. COMPARISON:  None. FINDINGS: Brain: There is hemorrhage in the left frontal lobe measuring 2.7 x 2.0 x 2.8 cm with a large amount surrounding vasogenic edema. This is likely a mass. Brain parenchyma is otherwise normal. There is no herniation. 2 mm of rightward midline shift. Vascular: Negative Skull: Normal Sinuses/Orbits: Clear sinuses. Normal orbits. Other: None IMPRESSION: Left frontal lobe hemorrhagic mass with surrounding vasogenic edema and 2 mm of rightward midline shift. MRI with and without contrast is recommended for further characterization. Critical Value/emergent results were called by telephone at the time of interpretation on 06/26/2019 at 8:39 pm to provider [facility] , who verbally acknowledged these results. Electronically Signed   By: Ulyses Jarred M.D.   On: 06/26/2019 20:39   CT CHEST WO CONTRAST  Result Date: 06/27/2019 CLINICAL DATA:  Brain mass. Lung nodule. EXAM: CT CHEST, ABDOMEN AND PELVIS WITHOUT CONTRAST TECHNIQUE: Multidetector CT imaging of the chest, abdomen and pelvis was performed following the standard protocol without IV contrast. COMPARISON:  Chest radiograph yesterday. CT chest abdomen pelvis 03/01/2018 FINDINGS: CT CHEST FINDINGS Cardiovascular: Aortic atherosclerosis. No aortic aneurysm. Heart is normal in size. No pericardial effusion. Mediastinum/Nodes: Accurate assessment for hilar adenopathy is limited in the absence of IV contrast. There are no enlarged mediastinal lymph nodes. Endotracheal tube with tip above the carina. Enteric tube decompresses the esophagus. There is a 16 mm  nodule that abuts the isthmus of the thyroid, likely unchanged from prior, on the previous exam portions of this region obscured by streak artifact from IV contrast. There is no axillary adenopathy. Lungs/Pleura: Spiculated right middle lobe pulmonary nodule measures 11 x 11 x 10 mm, only minimally increased in size from prior exam. Margins are irregular and spiculated with small spiculation extending to the  pleural surface anteriorly, minimal pleural thickening. No other pulmonary nodule or mass. Dependent opacities in the lung bases favor atelectasis. Trace pleural thickening without significant effusion. Musculoskeletal: No blastic or evidence of destructive lytic lesion. Ordinary degenerative change in the spine. No obvious breast mass. CT ABDOMEN PELVIS FINDINGS Hepatobiliary: Lack of IV contrast limits assessment for focal liver lesion. Capsular calcification involving the dome. No evidence of focal hepatic lesion. Gallbladder physiologically distended, no calcified stone. No biliary dilatation. Pancreas: No ductal dilatation or inflammation. No evidence of focal lesion on this noncontrast exam. Spleen: Normal in size without focal abnormality. Adrenals/Urinary Tract: Slight adrenal thickening without renal nodule. No hydronephrosis. Mild prominence of both renal collecting systems. No evidence of focal renal lesion on noncontrast exam. Urinary bladder is decompressed by Foley catheter. Stomach/Bowel: Enteric tube tip in the stomach. Contrast and fluid in the stomach without evidence of focal gastric mass. No small bowel wall thickening or inflammation. No obstruction, administered enteric contrast reaches the colon. Normal contrast filled appendix. Moderate stool distally. Vascular/Lymphatic: Aortic atherosclerosis. No aortic aneurysm. No enlarged lymph nodes in the abdomen or pelvis, detailed assessment limited in the absence of IV contrast. Small calcified lymph node adjacent to the proximal sigmoid colon,  suggesting prior granulomatous disease. Reproductive: Uterus and bilateral adnexa are unremarkable. No evidence of adnexal mass. Other: No ascites or free fluid. No omental thickening. Musculoskeletal: No blastic or destructive lytic lesions. Transitional lumbosacral anatomy. Degenerative disc disease at L4-L5. There are no acute or suspicious osseous abnormalities. Calcified granuloma in the subcutaneous soft tissues. No suspicious skin or subcutaneous lesion. IMPRESSION: 1. Spiculated right middle lobe pulmonary nodule measuring 11 x 11 x 10 mm, only minimally increased in size from prior exam. This is suspicious for primary bronchogenic malignancy. Evaluation of the hila is limited in the absence of IV contrast, allowing for this limitation, no evidence of nodal metastasis. 2. No other evidence of metastatic disease or primary malignancy in the chest, abdomen, or pelvis. 3. Exophytic 16 mm nodule from the thyroid isthmus, patient had previous thyroid uptake and scan at an outside institution that reported this nodule, however size was not specified, and those images are not available. No thyroid ultrasounds are available for review. Therefore, recommend thyroid US (ref: J Am Coll Radiol. 2015 Feb;12(2): 143-50), unless performed elsewhere. 4. Dependent opacities in the lung bases favor atelectasis. Aortic Atherosclerosis (ICD10-I70.0). Electronically Signed   By: Keith Rake M.D.   On: 06/27/2019 16:56   MR BRAIN W WO CONTRAST  Result Date: 06/30/2019 CLINICAL DATA:  Post mass resection EXAM: MRI HEAD WITHOUT AND WITH CONTRAST TECHNIQUE: Multiplanar, multiecho pulse sequences of the brain and surrounding structures were obtained without and with intravenous contrast. CONTRAST:  47mL GADAVIST GADOBUTROL 1 MMOL/ML IV SOLN COMPARISON:  06/27/2019 FINDINGS: Brain: Post resection of left mass with thin extra-axial collection and resection cavity containing fluid and blood products. There is reduced diffusion at  the deep margin likely reflecting postoperative contusion. There is no residual nodular enhancement. Associated edema and regional mass effect are similar. Vascular: Major vessel flow voids at the skull base are preserved. Skull and upper cervical spine: Post left frontal craniotomy. Normal marrow signal is preserved. Sinuses/Orbits: Paranasal sinuses are aerated. Orbits are unremarkable. Other: Sella is unremarkable.  Mastoid air cells are clear. IMPRESSION: Expected postoperative changes of gross total resection of left frontal mass. Surrounding edema and mild mass effect are similar. Electronically Signed   By: Macy Mis M.D.   On: 06/30/2019 08:08  MR BRAIN W WO CONTRAST  Result Date: 06/27/2019 CLINICAL DATA:  Brain mass.  History of lung nodule. EXAM: MRI HEAD WITHOUT AND WITH CONTRAST TECHNIQUE: Multiplanar, multiecho pulse sequences of the brain and surrounding structures were obtained without and with intravenous contrast. CONTRAST:  5.25mL GADAVIST GADOBUTROL 1 MMOL/ML IV SOLN COMPARISON:  CT head 06/26/2019. MRI head without with contrast 06/27/2019 FINDINGS: Brain: SRS protocol with thin sections performed on 3 tesla MRI. Solitary mass left frontal lobe with evidence of internal hemorrhage. The mass measures 2.2 x 2.3 x 2.8 cm. There is extensive vasogenic edema in the white matter. This is causing mass-effect and mild midline shift to the right of approximately 2 mm. Ventricle size normal. No second lesion identified. Mild chronic microvascular ischemic change in the white matter. Negative for acute infarct. Vascular: Normal arterial flow voids Skull and upper cervical spine: No focal skeletal lesion. Sinuses/Orbits: Mild mucosal edema paranasal sinuses. Bilateral orbits Other: None IMPRESSION: Solitary hemorrhagic mass left frontal lobe measuring 2.2 x 2.3 x 2.8 cm. Extensive vasogenic edema. 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 Primary brain tumor not likely. Electronically  Signed   By: Franchot Gallo M.D.   On: 06/27/2019 17:43   MR BRAIN W WO CONTRAST  Result Date: 06/27/2019 CLINICAL DATA:  Mass follow-up EXAM: MRI HEAD WITHOUT AND WITH CONTRAST TECHNIQUE: Multiplanar, multiecho pulse sequences of the brain and surrounding structures were obtained without and with intravenous contrast. CONTRAST:  69mL GADAVIST GADOBUTROL 1 MMOL/ML IV SOLN COMPARISON:  Head CT from yesterday FINDINGS: Brain: 2.6 cm left lateral frontal mass which appears intra-axial. The mass is T2 hypointense and mildly dense by CT. There is patchy T1 shortening compatible with blood products based on gradient imaging. There is prominent adjacent vasogenic edema. The mass is diffusely enhancing and not a simple hematoma. No second mass is seen.  No infarct, hydrocephalus, or collection. Vascular: Normal flow voids and vascular enhancements Skull and upper cervical spine: Normal marrow signal Sinuses/Orbits: Negative IMPRESSION: 1. 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 There was a worrisome pulmonary nodule on a January 2020 chest CT, updated chest CT may be contributory. 2. Extensive vasogenic edema. Electronically Signed   By: Monte Fantasia M.D.   On: 06/27/2019 08:12   DG CHEST PORT 1 VIEW  Result Date: 07/05/2019 CLINICAL DATA:  [place]. Additional provided: Patient recently presented with seizure was found to have a large hemorrhagic left frontal lobe mass. EXAM: PORTABLE CHEST 1 VIEW COMPARISON:  Chest radiograph 06/28/2019, chest CT 06/27/2019 FINDINGS: Heart size within normal limits. Mild ill-defined opacity at the right lung base. Redemonstrated right middle lobe lung nodule better characterized on chest CT 06/27/2019. The left lung is clear. No evidence of pleural effusion or pneumothorax. No acute bony abnormality identified. IMPRESSION: Mild ill-defined opacity at the right lung base which may reflect atelectasis or pneumonia. Redemonstrated right middle  lobe pulmonary nodule better characterized on prior chest CT 06/27/2019. Electronically Signed   By: Kellie Simmering DO   On: 07/05/2019 11:06   DG CHEST PORT 1 VIEW  Result Date: 06/28/2019 CLINICAL DATA:  Acute hypoxemic respiratory failure EXAM: PORTABLE CHEST 1 VIEW COMPARISON:  CT 06/27/2019 FINDINGS: *Endotracheal tube in the mid trachea 3 cm from the carina. *Transesophageal tube tip and side port distal to the GE junction curling in the left upper quadrant. *Telemetry leads and support devices overlie the chest. Redemonstration of a right middle lobe pulmonary nodule better seen on comparison CT.  Mild basilar atelectatic changes. No acute consolidative opacity or convincing features of edema. No pneumothorax or effusion. Cardiomediastinal contours are stable. No acute osseous or soft tissue abnormality. Degenerative changes are present in the imaged spine and shoulders. IMPRESSION: 1. Mild basilar atelectatic changes. 2. Redemonstration of right middle lobe pulmonary nodule better seen on comparison CT. 3. Lines and tubes as above. Electronically Signed   By: Lovena Le M.D.   On: 06/28/2019 06:27   DG Chest Port 1 View  Result Date: 06/27/2019 CLINICAL DATA:  Intubation. EXAM: PORTABLE CHEST 1 VIEW COMPARISON:  Chest x-ray 06/26/2019, 03/01/2018. FINDINGS: Endotracheal tube, NG tube in good anatomic position. NG tube has been advanced its tip and side hole in the stomach. Heart size normal. As previously noted on prior study of 06/26/2018 right lower lobe nodule appears larger. Again further evaluation with chest CT is suggested. Mild left base subsegmental atelectasis. No pleural effusion or pneumothorax. IMPRESSION: 1. Lines and tubes in good anatomic position. NG tube has been advanced its tip and side hole are within the stomach. 2. As noted on prior study of 06/26/2018 right lower lobe pulmonary nodule appears larger. Again further evaluation with chest CT suggested. These results will be called  to the ordering clinician or representative by the Radiologist Assistant, and communication documented in the PACS or [employer]. Electronically Signed   By: Marcello Moores  Register   On: 06/27/2019 08:42   DG Chest Portable 1 View  Result Date: 06/26/2019 CLINICAL DATA:  69 year old female status post intubation. EXAM: PORTABLE CHEST 1 VIEW COMPARISON:  Earlier radiograph dated 06/26/2019. FINDINGS: Endotracheal tube with tip approximately 18 mm above the carina. Recommend retraction by 2-3 cm for optimal positioning. Enteric tube with side port above the GE junction. Recommend further advancing by additional 8 cm. No other interval change. IMPRESSION: 1. Endotracheal tube above the carina. 2. Enteric tube with side port above the GE junction. Recommend further advancing by additional 8 cm. Electronically Signed   By: Anner Crete M.D.   On: 06/26/2019 21:27   DG Chest Portable 1 View  Result Date: 06/26/2019 CLINICAL DATA:  Fever EXAM: PORTABLE CHEST 1 VIEW COMPARISON:  03/01/2018 FINDINGS: The heart size and mediastinal contours are within normal limits. Right lower lung nodule may be slightly increased, now measuring 14 mm. The visualized skeletal structures are unremarkable. IMPRESSION: No active disease. Suspected slight increase in size of right lower lung nodule. Follow-up chest CT is recommended. Electronically Signed   By: Donavan Foil M.D.   On: 06/26/2019 20:48   EEG adult  Result Date: 06/30/2019 Lora Havens, MD     07/01/2019  8:50 AM Patient Name: Sara Schroeder MRN: [id number] Epilepsy Attending: Lora Havens Referring Physician/Provider:  Dr. 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  EEG evaluate for seizures.  Level of alertness:  Awake  AEDs during EEG study: Keppra  Technical aspects: This EEG study was done with scalp electrodes positioned  according to the 10-20 International system of electrode placement. Electrical activity was acquired at a sampling rate of 500Hz  and reviewed with a high frequency filter of 70Hz  and a low frequency filter of 1Hz . EEG data were recorded continuously and digitally stored.  Description:  No clear posterior dominant rhythm was seen.  EEG showed continuous generalized polymorphic 5 to 9 Hz theta-alpha activity.  Hyperventilation and photic stimulation were not performed. Of  note, EEG was technically difficult due to significant eye flutter artifact.  Abnormality -Continuous slow, generalized  IMPRESSION: This technically difficult study suggestive of moderate diffuse encephalopathy, nonspecific etiology. No seizures or definite epileptiform discharges were seen throughout the recording. Sara Schroeder   Overnight EEG with video  Result Date: 07/01/2019 Lora Havens, MD     07/02/2019  9:21 AM Patient Name:Sara Schroeder PYK:[id number] Epilepsy Attending:Priyanka [name] Schroeder Referring Physician/Provider: Dr. Emelda Brothers Duration:06/30/2019 [phone] to 07/01/2019 1223  Patient history:69 year old female presented with seizure-like episode and was found to have left frontal lobe hemorrhagic mass. EEG evaluate for seizures.  Level of alertness: awake, asleep  AEDs during EEG study:Keppra  Technical aspects: This EEG study was done with scalp electrodes positioned according to the 10-20 International system of electrode placement. Electrical activity was acquired at a sampling rate of 500Hz  and reviewed with a high frequency filter of 70Hz  and a low frequency filter of 1Hz . EEG data were recorded continuously and digitally stored.  Description: No clear posterior dominant rhythm was seen. Sleep was characterized by vertex waves, sleep spindles (12-14hz ), maximal frontocentral region. EEG showed continuous generalized and lateralized left frontotemporal region  3-6 Hz theta-delta slowing.  Hyperventilation and  photic stimulation were not performed.  Abnormality -Continuous slow, generalized and lateralized left frontotemporal region  IMPRESSION: This study suggestive of cortical dysfunction in left frontotemporal region consistent with underlying structural abnormality. Additionally, there is evidence of moderate diffuse encephalopathy, nonspecific etiology.No seizures ordefiniteepileptiform discharges were seen throughout the recording.  Lora Havens   US THYROID  Result Date: 06/28/2019 CLINICAL DATA:  Incidental on CT. Nodule of thyroid isthmus by CT of the chest. EXAM: THYROID ULTRASOUND TECHNIQUE: Ultrasound examination of the thyroid gland and adjacent soft tissues was performed. COMPARISON:  CT of the chest on 06/27/2019 FINDINGS: Parenchymal Echotexture: Mildly heterogenous Isthmus: 1.0 cm Right lobe: 4.4 x 1.7 x 1.2 cm Left lobe: 4.3 x 1.9 x 1.3 cm _________________________________________________________ Estimated total number of nodules >/= 1 cm: 1 Number of spongiform nodules >/=  2 cm not described below (TR1): 0 Number of mixed cystic and solid nodules >/= 1.5 cm not described below (TR2): 0 _________________________________________________________ Nodule # 1: Location: Isthmus Maximum size: 1.6 cm; Other 2 dimensions: 1.4 x 1.1 cm Composition: solid/almost completely solid (2) Echogenicity: hypoechoic (2) Shape: not taller-than-wide (0) Margins: ill-defined (0) Echogenic foci: none (0) ACR TI-RADS total points: 4. ACR TI-RADS risk category: TR4 (4-6 points). ACR TI-RADS recommendations: **Given size (>/= 1.5 cm) and appearance, fine needle aspiration of this moderately suspicious nodule should be considered based on TI-RADS criteria. _________________________________________________________ Simple cyst in the inferior left lobe measures 0.9 cm in greatest diameter and has a benign appearance. No abnormal lymph nodes identified IMPRESSION: 1.6 cm isthmus nodule technically meets criteria for  fine-needle aspiration. The nodule is borderline in size for biopsy and 1 year follow-up ultrasound could also be considered. The above is in keeping with the ACR TI-RADS recommendations - J Am Coll Radiol 2017;14:587-595. Electronically Signed   By: Aletta Edouard M.D.   On: 06/28/2019 15:41    Labs:  Basic Metabolic Panel: Recent Labs  Lab 07/06/19 0702 07/07/19 0644 07/10/19 0611  NA 139 138 137  K 4.1 4.0 3.9  CL 100 102 103  CO2 30 26 23   GLUCOSE 123* 140* 127*  BUN 20 24* 22  CREATININE 0.71 0.75 0.68  CALCIUM 9.2 9.5 9.5    CBC: Recent Labs  Lab 07/06/19 0702 07/07/19 0644 07/10/19  0611  WBC 12.7* 12.5* 10.7*  NEUTROABS 9.2* 8.8* 7.7  HGB 12.2 12.1 11.9*  HCT 37.9 37.6 36.2  MCV 95.0 94.7 94.0  PLT 310 305 348    CBG: No results for input(s): GLUCAP in the last 168 hours.  Family history.  Mother and father with hyperlipidemia as well as hypertension.  Denies any colon cancer esophageal cancer or rectal cancer  Brief HPI:   Sara Schroeder is a 69 y.o. right-handed female with history of anxiety as well as hypertension pulmonary nodule January 2020.  Presented 06/27/2019 with seizure and aphasia.  Prior to admission was living with spouse and family.  Cranial CT scan showed left frontal hemorrhagic mass with surrounding vasogenic edema and a 2 mm rightward midline shift.  MRI solitary 2.6 cm left lateral frontal mass compatible with hemorrhagic neoplasm.  CT of the chest abdomen pelvis showed spiculated right middle lobe pulmonary nodule measuring 11 x 11 x 10 mm.  No evidence of metastatic disease.  Patient underwent left frontal craniotomy tumor resection 06/29/2019 per Dr. Venetia Constable.  She was extubated 06/24/2019.  EEG did not show seizure activity Keppra maintained for seizure prophylaxis.  Oncology service Dr. 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  Radiation oncology consulted with plans for  radiation therapy to the brain.  Plan PET scan as an outpatient.  Decadron protocol as indicated.  Tolerating a dysphagia #1 thin liquid diet.  Subcutaneous Lovenox for DVT prophylaxis initiated 07/03/2019.  Therapy evaluations completed and patient was admitted for a comprehensive rehab program   Hospital Course: Tiziana Cislo was admitted to rehab 07/07/2019 for inpatient therapies to consist of PT, ST and OT at least three hours five days a week. Past admission physiatrist, therapy team and rehab RN have worked together to provide customized collaborative inpatient rehab.  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  Surgical site healing nicely plan follow-up neurosurgery Dr. Venetia Constable as well as oncology service Dr. Marin Olp and radiation oncology Dr. Mickeal Skinner.  Subcutaneous Lovenox for DVT prophylaxis no bleeding episodes.  She remained on Keppra for seizure prophylaxis EEG negative.  Completing course of Diflucan for oral thrush.  She did have some mild tachycardia EKG showed sinus tach no chest pain or shortness of breath.  Blood pressure was somewhat soft HCTZ discontinued.   Blood pressures were monitored on TID basis and controlled  Sara Schroeder is continent of bowel and bladder.  Sara Schroeder has made gains during rehab stay and is attending therapies  Sara Schroeder will continue to receive follow up therapies   after discharge  Rehab course: During patient's stay in rehab weekly team conferences were held to monitor patient's progress, set goals and discuss barriers to discharge. At admission, patient required minimal assist ambulate 60 feet to person hand-held assist moderate assist stand pivot transfers minimal guard supine to sit.  Max assist upper body bathing max is lower body bathing max is upper body dressing minimal assist lower body dressing  Physical exam.  Blood pressure 116/62 pulse 110 temperature 95  respirations 18 oxygen saturation 98% room air Constitutional.  Well-developed well-nourished HEENT Head.  Normocephalic and atraumatic Eyes.  Pupils round and reactive to light no discharge without nystagmus Neck.  Supple nontender no JVD without thyromegaly Respiratory effort normal no respiratory distress without wheeze GI.  Soft nontender positive bowel sounds without rebound Cardiac regular rate rhythm without any extra  sounds or murmur heard Musculoskeletal no edema or tenderness in extremities Neurological alert makes good eye contact with examiner inconsistent to follow some basic commands question due to language barrier.  Motor bilateral upper extremities 5/5 proximal distal bilateral lower extremities limited due to comprehension but moving freely.   Sara Schroeder  has had improvement in activity tolerance, balance, postural control as well as ability to compensate for deficits. Sara Schroeder has had improvement in functional use RUE/LUE  and RLE/LLE as well as improvement in awareness.  Patient perform supine to sit independently ambulates rolling walker close supervision contact-guard assist without assistive device ambulates up to 150 feet.  Gather his belongings for activities delivered and homemaking perform toilet transfers with supervision.  Full family teaching completed plan discharge to home       Disposition: Discharged to home    Diet: Regular  Special Instructions: No driving smoking or alcohol  Medications at discharge 1.  Tylenol as needed 2.  Colace 100 mg twice daily 3.  Diflucan 100 mg p.o. daily x5 more doses 4.  Keppra 1000 mg p.o. twice daily 5.  Multivitamin daily 6.  Trazodone 100 mg nightly 7.  Pravachol 20 mg daily   30-35 minutes were spent completing discharge summary and discharge planning    Follow-up Information    Meredith Staggers, MD Follow up.   Specialty: Physical Medicine and Rehabilitation Why: Office to call for appointment Contact  information: [address] [place] [address] [phone]        Judith Part, MD Follow up.   Specialty: Neurosurgery Why: Call for appointment Contact information: [place] [address] [phone]        Volanda Napoleon, MD Follow up.   Specialty: Oncology Why: Call for appointment Contact information: [address] [place] [address] [phone]        Ventura Sellers, MD Follow up.   Specialties: Psychiatry, Neurology, Oncology Why: Call for appointment Contact information: [place] [address] [phone]           Signed: Lavon Paganini [place] 07/12/2019, 4:59 AM

## 2019-07-11 NOTE — Progress Notes (Signed)
Speech Language Pathology Discharge Summary  Patient Details  Name: Sara Schroeder MRN: [id number] Date of Birth: 13-Dec-1950  Today's Date: 07/11/2019 SLP Individual Time: 1255-1350 SLP Individual Time Calculation (min): 55 min   Skilled Therapeutic Interventions:   Skilled treatment session focused on cognitive-linguistic goals and completion of family education. SLP facilitated session by providing Mod A verbal cues for patient to identify problems and possible solutions in pictures and was overall Mod I for word-finding throughout task. Patient's husband present and educated on strategies to maximize word-finding, attention and overall safety. He verbalized understanding of information. Patient left upright in bed with alarm on and all needs within reach.   Patient has met 4 of 4 long term goals.  Patient to discharge at [facility] level.   Reasons goals not met: N/A   Clinical Impression/Discharge Summary: Patient has made functional gains and has met 4 of 4 LTGs this admission. Currently, patient requires overall Min A verbal cues for auditory comprehension of mildly complex information and for verbal expression of wants/needs in regards to word-finding. Patient also demonstrates improved speech intelligibility due to increased vocal intensity and increased ability to self-monitor and correct errors during functional tasks. Patient and family education is complete and patient will discharge home with 24 hour supervision from family. Patient would benefit from f/u SLP services to maximize her cognitive-linguistic function and overall functional independence in order to reduce caregiver burden.   Care Partner:  Caregiver Able to Provide Assistance: Yes  Type of Caregiver Assistance: Physical;Cognitive  Recommendation:  Home Health SLP;24 hour supervision/assistance  Rationale for SLP Follow Up: Maximize cognitive function and independence;Reduce caregiver burden;Maximize functional communication    Equipment: N/A   Reasons for discharge: Refusal of 3 consecutive treatment sessions without medical reason;Treatment goals met   Patient/Family Agrees with Progress Made and Goals Achieved: Yes    Olene Godfrey 07/11/2019, 3:12 PM

## 2019-07-11 NOTE — Progress Notes (Addendum)
Physical Therapy Discharge Summary  Patient Details  Name: Sara Schroeder MRN: [id number] Date of Birth: [birth year]  Today's Date: 07/11/2019 PT Individual Time: [phone] and 1420-1450 PT Individual Time Calculation (min): 53 min and 30 min    Patient has met 10 of 10 long term goals due to improved activity tolerance, improved balance, improved postural control, increased strength, ability to compensate for deficits, improved attention and improved awareness.  Patient to discharge at an ambulatory level Supervision.   Patient's care partner is independent to provide the necessary physical and cognitive assistance at discharge.  Reasons goals not met: n/a  Recommendation:  Patient will benefit from ongoing skilled PT services in home health setting to continue to advance safe functional mobility, address ongoing impairments in balance, strength, functional mobility, activity tolerance, cognition, visual acuity, patient/caregiver education, and minimize fall risk.  Equipment: youth RW and transport chair (for community mobility due to fluctuating activity tolerance secondary to medical diagnosis and treatment for metastatic adenocarcinoma)  Reasons for discharge: treatment goals met  Patient/family agrees with progress made and goals achieved: Yes  Skilled Therapeutic Interventions: Session 1: Patient in bed with her son, husband, and CSW in the room upon PT arrival. Patient alert and agreeable to PT session. Patient denied pain during session. Patient's son stated that he was leaving and unable to interpret during session. PT retrieved the Stratus interpretation device. Patient missed 7 min of skilled PT due to locating and setting up the Stratus device (audio not working initially), RN made aware. Will attempt to make-up missed time as able.  Stratus interpreter was used throughout session. Patient's husband participated in hands-on family training throughout session.  Patient's personal  equipment was delivered during session. Provided instructions on set up and use of equipment at home.   Therapeutic Activity: Bed Mobility: Patient performed rolling R/L and supine to/from sit independently in a flat bed without use of bed rails. Transfers: Patient performed sit to/from stand from the hospital bed, toilet, ADL couch, and mat table with supervision using a RW. Provided verbal cues for reaching back to sit and hand placement on RW, patient's husband provided cues and demonstration of safe guarding technique x2 following PT demonstration. Patient performed a simulated sedan height car transfer with supervision using RW. Provided cues for safe technique.  Gait Training:  Patient ambulated 94 feet, 81 feet, and100 feet using RW with close supervision for safety. Ambulated as described below. Provided verbal cues for erect posture, increased step height, and staying close to the RW for safety. Patient's husband demonstrated safe guarding technique on final 2 trials with intermittent CGA due to patient and her husbands preference for safety. Patient ambulated up/down a ramp, over 10 feet of mulch (unlevel surface), and up/down a curb to simulate community ambulation over unlevel surfaces with CGA using RW. Provided cues for technique and use of AD. Demonstrated safe guarding technique and sequencing on curb to simulate assist for single step into patient's home for her husband.  Educated patient and her husband on fall risk/prevention, home modifications to prevent falls, and activation of emergency services in the event of a fall during session.   Patient in bed with her husband in the room at end of session with breaks locked, bed alarm set, and all needs within reach.   Session 2: Patient sitting in bed with her husband in the room upon PT arrival. Stratus interpreter used throughout session. Patient alert and agreeable to PT session. Patient denied pain during session. Per discussion  with RN, patient's husband has been assisting her to the bathroom. Patient and her husband demonstrated this in the room and demonstrated safe guarding and mobility. Cleared patient's husband to assist patient to ambulate to/from the bathroom using the RW with non-skid socks donned, RN made aware. Educated on notifying nursing if patient is left alone in the room to set the bed/chair alarm. Patient and her husband stated understanding.   Therapeutic Activity: Transfers: Patient performed sit to/from stand x3 with supervision using the RW. Provided verbal cues as above.  Gait Training:  6 Min Walk Test:  Instructed patient to ambulate as quickling and as safely as possible for 6 minutes using LRAD. Patient was allowed to take standing rest breaks without stopping the test, but if he required a sitting rest break the clock would be stopped and the test would be over.  Results: 490 feet using a RW  Patient ascended/descended 12-6" steps using B rails with CGA for safety. Performed reciprocal gait pattern. Provided cues for technique and sequencing.   Patient sitting in bed with her husband in the room at end of session with breaks locked, bed alarm off, and all needs within reach.    PT Discharge Precautions/Restrictions Precautions Precautions: Fall Precaution Comments: impaired vision Restrictions Weight Bearing Restrictions: No Vision/Perception  Vision - Assessment Eye Alignment: Within Functional Limits Alignment/Gaze Preference: Within Defined Limits Tracking/Visual Pursuits: Requires cues, head turns, or add eye shifts to track Saccades: Additional head turns occurred during testing Perception Perception: Impaired Spatial Orientation: bumps into objects while ambulating, unsure if visual or perceptual deficits contributing Praxis Praxis Impairment Details: Motor planning  Cognition Overall Cognitive Status: Impaired/Different from baseline Arousal/Alertness:  Awake/alert Orientation Level: Oriented to person;Oriented to place;Oriented to time Focused Attention: Appears intact Sustained Attention: Impaired Sustained Attention Impairment: Functional basic;Verbal basic Memory: Impaired Memory Impairment: Storage deficit Awareness: Impaired Awareness Impairment: Emergent impairment Problem Solving: Impaired Problem Solving Impairment: Functional basic;Verbal basic Safety/Judgment: Impaired Sensation Sensation Light Touch: Appears Intact Hot/Cold: Not tested Proprioception: Appears Intact Stereognosis: Not tested Coordination Gross Motor Movements are Fluid and Coordinated: No Coordination and Movement Description: impaired due to generalized weakness (R LE>L LE), impaired motor planning, impaired balance, and visual deficits affecting functional mobility Heel Shin Test: impaired, unable to determine if patient unable to follow cues or decreased motor control Motor  Motor Motor: Hemiplegia Motor - Skilled Clinical Observations: mild R hemiplegia  Mobility Bed Mobility Bed Mobility: Rolling Left Rolling Right: Independent Rolling Left: Independent Sitting - Scoot to Edge of Bed: Independent Sit to Supine: Independent Transfers Transfers: Sit to Stand;Stand to [employer] Transfers Sit to Stand: Supervision/Verbal cueing Stand to Sit: Supervision/Verbal cueing Stand Pivot Transfers: Supervision/Verbal cueing Transfer (Assistive device): Rolling walker Locomotion  Gait Ambulation: Yes Gait Assistance: Supervision/Verbal cueing Gait Distance (Feet): 490 Feet Assistive device: Rolling walker Gait Gait: Yes Gait Pattern: Step-through pattern;Decreased stride length;Right flexed knee in stance;Left flexed knee in stance;Trunk flexed;Narrow base of support;Decreased trunk rotation Gait velocity: decreased Stairs / Additional Locomotion Stairs: Yes Stairs Assistance: Contact Guard/Touching assist Stair Management Technique: Two  rails;Alternating pattern;Forwards Number of Stairs: 12 Height of Stairs: 6 Ramp: Contact Guard/touching assist(with RW) Curb: Contact Guard/Touching assist(with RW) Wheelchair Mobility Wheelchair Mobility: Yes Wheelchair Assistance: Dependent - Patient 0%(Transport chair)  Trunk/Postural Assessment  Cervical Assessment Cervical Assessment: Within Functional Limits Thoracic Assessment Thoracic Assessment: Exceptions to WFL(thoracic rounding) Lumbar Assessment Lumbar Assessment: Exceptions to WFL(posterior pelvid tilt in sitting) Postural Control Postural Control: Deficits on evaluation Postural Limitations: decreased  Balance  Standardized Balance Assessment Standardized Balance Assessment: Berg Balance Test Berg Balance Test Sit to Stand: Able to stand without using hands and stabilize independently Standing Unsupported: Able to stand safely 2 minutes Sitting with Back Unsupported but Feet Supported on Floor or Stool: Able to sit safely and securely 2 minutes Stand to Sit: Sits safely with minimal use of hands Transfers: Able to transfer safely, minor use of hands Standing Unsupported with Eyes Closed: Able to stand 10 seconds safely Standing Ubsupported with Feet Together: Able to place feet together independently and stand for 1 minute with supervision From Standing, Reach Forward with Outstretched Arm: Reaches forward but needs supervision From Standing Position, Pick up Object from Floor: Able to pick up shoe, needs supervision From Standing Position, Turn to Look Behind Over each Shoulder: Turn sideways only but maintains balance Turn 360 Degrees: Able to turn 360 degrees safely but slowly Standing Unsupported, Alternately Place Feet on Step/Stool: Able to complete 4 steps without aid or supervision Standing Unsupported, One Foot in Front: Able to take small step independently and hold 30 seconds Standing on One Leg: Tries to lift leg/unable to hold 3 seconds but remains  standing independently Total Score: 40 Static Sitting Balance Static Sitting - Level of Assistance: 7: Independent Dynamic Sitting Balance Dynamic Sitting - Level of Assistance: 7: Independent Dynamic Sitting - Balance Activities: Lateral lean/weight shifting;Forward lean/weight shifting;Reaching for objects Static Standing Balance Static Standing - Level of Assistance: 7: Independent Dynamic Standing Balance Dynamic Standing - Balance Support: Right upper extremity supported;Left upper extremity supported Dynamic Standing - Level of Assistance: 6: Modified independent (Device/Increase time) Dynamic Standing - Balance Activities: Lateral lean/weight shifting;Forward lean/weight shifting;Reaching for objects Extremity Assessment  RLE Assessment RLE Assessment: Exceptions to [facility] Active Range of Motion (AROM) Comments: WFL for all functional mobility, tight hamstrings and heel cords General Strength Comments: Grossly in sititng: hip flexion 4+/5, knee extension 5/5, knee flexion 4+/5, DF/PF 4+/5 LLE Assessment LLE Assessment: Within Functional Limits Active Range of Motion (AROM) Comments: WFL for all functional mobility, tight hamstrings and heel cords General Strength Comments: Grossly in sitting 5/5 throughout    Markevion Lattin L Preciosa Bundrick PT, DPT  07/11/2019, 4:03 PM

## 2019-07-11 NOTE — Progress Notes (Addendum)
[facility] PHYSICAL MEDICINE & REHABILITATION PROGRESS NOTE   Subjective/Complaints: Up in room. Family along side. Pleased to be going home tomorrow  ROS: limited due to language/communication    Objective:   No results found. Recent Labs    07/10/19 0611  WBC 10.7*  HGB 11.9*  HCT 36.2  PLT 348   Recent Labs    07/10/19 0611  NA 137  K 3.9  CL 103  CO2 23  GLUCOSE 127*  BUN 22  CREATININE 0.68  CALCIUM 9.5    Intake/Output Summary (Last 24 hours) at 07/11/2019 0958 Last data filed at 07/11/2019 0842 Gross per 24 hour  Intake 560 ml  Output --  Net 560 ml     Physical Exam: Vital Signs Blood pressure 100/61, pulse 100, temperature 97.8 F (36.6 C), resp. rate 18, height 5\' 2"  (1.575 m), weight 54.8 kg, SpO2 98 %. Constitutional: No distress . Vital signs reviewed. HEENT: EOMI, oral membranes moist Neck: supple Cardiovascular: RRR without murmur. No JVD    Respiratory/Chest: CTA Bilaterally without wheezes or rales. Normal effort    GI/Abdomen: BS +, non-tender, non-distended Ext: no clubbing, cyanosis, or edema Psych: flat Musculoskeletal:     Comments: No edema or tenderness in extremities  Neurological: She is alert.  With son translating she follows basic commands. Slow to initiate.  Motor: Bilateral upper extremities appear to be 4/5 as are LE's.  Skin: Skin is warm and dry. Scalp incision CDI    Assessment/Plan: 1. 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  Physiatrist is providing close team supervision and 24 hour management of active medical problems listed below.  Physiatrist and rehab team continue to assess barriers to discharge/monitor patient progress toward functional and medical goals  Care Tool:  Bathing    Body parts bathed by patient: Right arm, Left arm, Chest, Abdomen, Right upper leg, Right  lower leg, Face, Left lower leg, Left upper leg, Buttocks, Front perineal area         Bathing assist Assist Level: Supervision/Verbal cueing     Upper Body Dressing/Undressing Upper body dressing   What is the patient wearing?: Pull over shirt    Upper body assist Assist Level: Supervision/Verbal cueing    Lower Body Dressing/Undressing Lower body dressing      What is the patient wearing?: Underwear/pull up, Pants     Lower body assist Assist for lower body dressing: Supervision/Verbal cueing     Toileting Toileting    Toileting assist Assist for toileting: Supervision/Verbal cueing     Transfers Chair/bed transfer  Transfers assist     Chair/bed transfer assist level: Supervision/Verbal cueing     Locomotion Ambulation   Ambulation assist      Assist level: Supervision/Verbal cueing Assistive device: Walker-rolling Max distance: 150'   Walk 10 feet activity   Assist     Assist level: Supervision/Verbal cueing Assistive device: Walker-rolling   Walk 50 feet activity   Assist    Assist level: Supervision/Verbal cueing Assistive device: Walker-rolling    Walk 150 feet activity   Assist Walk 150 feet activity did not occur: Safety/medical concerns(Per PT max distance 143 ft.due to fatigue)  Assist level: Supervision/Verbal cueing Assistive device: Walker-rolling    Walk 10 feet on uneven surface  activity   Assist     Assist level: Minimal Assistance - Patient > 75% Assistive device: Hand held assist  Wheelchair     Assist Will patient use wheelchair at discharge?: No             Wheelchair 50 feet with 2 turns activity    Assist            Wheelchair 150 feet activity     Assist          Blood pressure 100/61, pulse 100, temperature 97.8 F (36.6 C), resp. rate 18, height 5\' 2"  (1.575 m), weight 54.8 kg, SpO2 98 %.    Medical Problem List and Plan: 1.  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  Status post left frontal craniotomy tumor resection 06/29/2019.  Patient will follow up outpatient oncology services Dr. Marin Olp as well as radiation oncology Dr. Mickeal Skinner             -patient may shower             -ELOS/Goals: dc home 5/19             team conference today  -per rad-onc, MRI after discharge with XRT as outpt 2.  Antithrombotics: -DVT/anticoagulation: Subcutaneous Lovenox             -antiplatelet therapy: N/A 3. Pain Management: Tylenol as needed             Monitor headaches with increased exertion 4. Mood: Provide emotional support             -antipsychotic agents: N/A 5. Neuropsych: This patient is not fully capable of making decisions on her own behalf. 6. Skin/Wound Care: Routine skin checks 7. Fluids/Electrolytes/Nutrition: Routine in and outs.  CMP ordered. Ensure.  8.  Seizure disorder.  Continue Keppra 1000 mg twice daily.  EEG negative 9.  Oral thrush.  diflucan to completion 10.  Tachycardia.             ECG on 5/3 showing atrial flutter               repeated EKG  5/17 which demonstrates sinus tach 11.  Leukocytosis-likely secondary to steroids             CBC ordered. Trending downward : 10.7 5/17  12. Throat pain: prn magic mouthwash with lidocaine. See #9.   -5/18 seems improved.  .   LOS: 4 days A FACE TO FACE EVALUATION WAS PERFORMED  Meredith Staggers 07/11/2019, 9:58 AM

## 2019-07-11 NOTE — Progress Notes (Signed)
Patient ID: Sara Schroeder, female   DOB: February 06, 1951, 69 y.o.   MRN: [id number]   SW left message for pt son [name] [phone]) to inform on pt d/c tomorrow, and DME ordered. SW also explained recommendation of HHPT/OT. SW requested follow-up to discuss further.   SW went to pt room, and met with pt, pt husband, and their son [name] to discuss discharge. SW informed on above. No HHA preference. SW explained current constraints with HHA and their area being challenging to staff at times. Reports there will be a family member to assist with translating for therapy services. Pt son asked about PCS services. SW explained that pt is too advanced for PCS at this time, however encouraged them to continue to monitor pt condition when she begins treatment, and if her ability to care for herself changes they should ask PCP to assist with completing a referral. Reports he will pick her up tomorrow, and will need to leave at 10am as he still has to go to work. SW indicated will follow-up once a HHA is obtained.   SW returned phone call to pt son [name]. SW informed on above conversation with brother [name]. He had many questions related to upcoming appointments as well. SW encouraged him to follow-up with oncology to discuss further and provided contact information from [facility]. He also reported concerns related to pt having anxiety and concerns related to pt follow-up with treatment. SW encouraged him to speak with oncology clinic and at time of consult request an SW to meet after appointment. SW informed there will be further updates once a HHA is obtained.  SW spoke with [facility] [phone]) to discuss HHPT/OT referral. Will follow-up if able to accept.  *[facility] unable to accept due to staffing.   SW waiting on follow-up from [name] about referral.   [name], MSW, [place] Office: [phone] Cell: [phone] Fax: [phone]

## 2019-07-11 NOTE — Patient Care Conference (Signed)
Inpatient RehabilitationTeam Conference and Plan of Care Update Date: 07/11/2019   Time: 2:43 PM    Patient Name: Sara Schroeder      Medical Record Number: [id number]  Date of Birth: [birth year] Sex: Female         Room/Bed: 4M04C/4M04C-01 Payor Info: Payor: MEDICAID [place] / Plan: MEDICAID [place] / Product Type: *No Product type* /    Admit Date/Time:  07/07/2019  3:29 PM  Primary Diagnosis:  Metastatic adenocarcinoma to brain [facility])  Patient Active Problem List   Diagnosis Date Noted  . Metastatic adenocarcinoma ([place]) 07/07/2019  . Metastatic adenocarcinoma to brain ([place])   . Leucocytosis   . Tachycardia   . Seizures ([place])   . 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 Brain metastasis ([place]) 07/06/2019  . Acute hypoxemic respiratory failure ([place])   . Intracerebral hemorrhage ([place]) 06/27/2019    Expected Discharge Date: Expected Discharge Date: 07/12/19  Team Members Present: Physician leading conference: Dr. Alger Simons Care Coodinator Present: [name], LCSWA;Christina Sampson Goon, BSW;Other (comment)(Primrose Oler Creig Hines, RN, BSN, CRRN) Nurse Present: Debroah Loop, RN PT Present: Apolinar Junes, PT OT Present: Cherylynn Ridges, OT SLP Present: Weston Anna, SLP PPS Coordinator present : Ileana Ladd, Burna Mortimer, SLP     Current Status/Progress Goal Weekly Team Focus  Bowel/Bladder   Patient continent of B/B, LBM 5/15  Maintain continence  Assess pain q shift and prn   Swallow/Nutrition/ Hydration             ADL's             Mobility   Independent bed mobility, supervision transfers and gait 150 ft with RW, CGA 1-4" step with RW, Berg 40/56 on 5/17  Supervision overall  Functional mobility, balance, strengthening, activity tolerance, motor planning, gait and stair training, d/c planning, patient/caregiver education   Communication   Mod A  Min A  mildly complex comprehension, verbal expression, word-finding    Safety/Cognition/ Behavioral Observations  Mod A  Min A  emergent awareness of errors   Pain   Pt c/o throat pain at times, has PRNs  Pain less than 3  Assess pain q shift and prn   Skin   Left head incision, OTA, clean & dry. Brusing on right side of abdomen  Prevent further skin breakdown  Assess skin q shift and prn    Rehab Goals Patient on target to meet rehab goals: Yes *See Care Plan and progress notes for long and short-term goals.     Barriers to Discharge  Current Status/Progress Possible Resolutions Date Resolved   Nursing                  PT  Home environment access/layout  No Barriers  Patient has 24/7 physical assist available from multiple family members, she can perform 1 step for home entry with CGA, have initated family education, patient is on track for d/c tomorrow           OT                  SLP                Care Coordinator                Discharge Planning/Teaching Needs:  D/c to home with 24/7 care from family  Family education as recommended by therapy   Team Discussion: No ST since eval d/t short LOS.   Revisions to Treatment  Plan: N/A     Medical Summary Current Status: metastatic lung ca to brain s/p left craniotomy. ekg negative for a fib. no seizures Weekly Focus/Goal: finalize rad-onc/onc plan.         Continued Need for Acute Rehabilitation Level of Care: The patient requires daily medical management by a physician with specialized training in physical medicine and rehabilitation for the following reasons: Direction of a multidisciplinary physical rehabilitation program to maximize functional independence : Yes Medical management of patient stability for increased activity during participation in an intensive rehabilitation regime.: Yes Analysis of laboratory values and/or radiology reports with any subsequent need for medication adjustment and/or medical intervention. : Yes   I attest that I was present, lead the team  conference, and concur with the assessment and plan of the team.   Cristi Loron 07/11/2019, 2:43 PM

## 2019-07-11 NOTE — Plan of Care (Signed)
  Problem: RH SAFETY Goal: RH STG ADHERE TO SAFETY PRECAUTIONS W/ASSISTANCE/DEVICE Description: STG Adhere to Safety Precautions With Assistance/Device. Outcome: Progressing Goal: RH STG DECREASED RISK OF FALL WITH ASSISTANCE Description: STG Decreased Risk of Fall With Assistance. Outcome: Progressing   Problem: RH COGNITION-NURSING Goal: RH STG USES MEMORY AIDS/STRATEGIES W/ASSIST TO PROBLEM SOLVE Description: STG Uses Memory Aids/Strategies With Assistance to Problem Solve. Outcome: Progressing Goal: RH STG ANTICIPATES NEEDS/CALLS FOR ASSIST W/ASSIST/CUES Description: STG Anticipates Needs/Calls for Assist With Assistance/Cues. Outcome: Progressing

## 2019-07-11 NOTE — Plan of Care (Signed)
  Problem: RH Balance Goal: LTG: Patient will maintain dynamic sitting balance (OT) Description: LTG:  Patient will maintain dynamic sitting balance with assistance during activities of daily living (OT) Outcome: Completed/Met Goal: LTG Patient will maintain dynamic standing with ADLs (OT) Description: LTG:  Patient will maintain dynamic standing balance with assist during activities of daily living (OT)  Outcome: Completed/Met   Problem: Sit to Stand Goal: LTG:  Patient will perform sit to stand in prep for activites of daily living with assistance level (OT) Description: LTG:  Patient will perform sit to stand in prep for activites of daily living with assistance level (OT) Outcome: Completed/Met   Problem: RH Grooming Goal: LTG Patient will perform grooming w/assist,cues/equip (OT) Description: LTG: Patient will perform grooming with assist, with/without cues using equipment (OT) Outcome: Completed/Met   Problem: RH Bathing Goal: LTG Patient will bathe all body parts with assist levels (OT) Description: LTG: Patient will bathe all body parts with assist levels (OT) Outcome: Completed/Met   Problem: RH Dressing Goal: LTG Patient will perform upper body dressing (OT) Description: LTG Patient will perform upper body dressing with assist, with/without cues (OT). Outcome: Completed/Met Goal: LTG Patient will perform lower body dressing w/assist (OT) Description: LTG: Patient will perform lower body dressing with assist, with/without cues in positioning using equipment (OT) Outcome: Completed/Met   Problem: RH Toileting Goal: LTG Patient will perform toileting task (3/3 steps) with assistance level (OT) Description: LTG: Patient will perform toileting task (3/3 steps) with assistance level (OT)  Outcome: Completed/Met   Problem: RH Toilet Transfers Goal: LTG Patient will perform toilet transfers w/assist (OT) Description: LTG: Patient will perform toilet transfers with assist,  with/without cues using equipment (OT) Outcome: Completed/Met   Problem: RH Tub/Shower Transfers Goal: LTG Patient will perform tub/shower transfers w/assist (OT) Description: LTG: Patient will perform tub/shower transfers with assist, with/without cues using equipment (OT) Outcome: Completed/Met   Problem: RH Attention Goal: LTG Patient will demonstrate this level of attention during functional activites (OT) Description: LTG:  Patient will demonstrate this level of attention during functional activites  (OT) Outcome: Completed/Met

## 2019-07-12 DIAGNOSIS — C7931 Secondary malignant neoplasm of brain: Secondary | ICD-10-CM | POA: Diagnosis not present

## 2019-07-12 MED ORDER — OMEPRAZOLE 20 MG PO CPDR: 20.0000 mg | DELAYED_RELEASE_CAPSULE | Freq: Every day | ORAL | 0 refills | Status: DC

## 2019-07-12 MED ORDER — FLUCONAZOLE 100 MG PO TABS: 100.0000 mg | ORAL_TABLET | Freq: Every day | ORAL | 0 refills | Status: AC

## 2019-07-12 MED ORDER — LEVETIRACETAM 1000 MG PO TABS: 1000.0000 mg | ORAL_TABLET | Freq: Two times a day (BID) | ORAL | 0 refills | Status: DC

## 2019-07-12 MED ORDER — TRAZODONE HCL 50 MG PO TABS: 100.0000 mg | ORAL_TABLET | Freq: Every day | ORAL | Status: DC

## 2019-07-12 MED ORDER — PRAVASTATIN SODIUM 20 MG PO TABS: 20.0000 mg | ORAL_TABLET | Freq: Every evening | ORAL | 0 refills | Status: DC

## 2019-07-12 MED ORDER — TRAZODONE HCL 50 MG PO TABS: 100.0000 mg | ORAL_TABLET | Freq: Every day | ORAL | 0 refills | Status: AC

## 2019-07-12 NOTE — Progress Notes (Signed)
Patient ID: Sara Schroeder, female   DOB: [birth year], 69 y.o.   MRN: [id number]  SW met with pt and [name] in room to provide updates on challenges with HHA. SW informed will continue to work on establishing [facility] services, and will follow-up with family once established.   [name]- referral declined [place]/[facility]- referral declined [facility]- referral declined due to no ST Intake/[facility]- referral declined due to staffing [place]- referral declined due to staffing  SW waiting on follow-up from [place] [phone] Referral declined.   SW faxed referral to [facility] [phone]). Sw waiting on follow-up.  *referral declined.   SW waiting on follow-up from [facility] for possible services. Pt may not have SLP until services become available.   [name], MSW, [place] Office: [phone] Cell: [phone] Fax: [phone]

## 2019-07-12 NOTE — Progress Notes (Signed)
Inpatient Rehabilitation Care Coordinator Assessment and Plan Inpatient Rehabilitation Care Coordinator Assessment and Plan  Patient Details  Name: Sara Schroeder MRN: [id number] Date of Birth: Jan 26, 1951  Today's Date: 07/12/2019  Problem List:  Patient Active Problem List   Diagnosis Date Noted  . Metastatic adenocarcinoma ([place]) 07/07/2019  . Metastatic adenocarcinoma to brain ([place])   . Leucocytosis   . Tachycardia   . Seizures ([place])   . 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 Brain metastasis ([place]) 07/06/2019  . Acute hypoxemic respiratory failure ([place])   . Intracerebral hemorrhage ([place]) 06/27/2019   Past Medical History:  Past Medical History:  Diagnosis Date  . Anxiety   . Hypertension    Past Surgical History:  Past Surgical History:  Procedure Laterality Date  . APPLICATION OF CRANIAL NAVIGATION N/A 06/29/2019   Procedure: APPLICATION OF CRANIAL NAVIGATION;  Surgeon: Judith Part, MD;  Location: [facility];  Service: Neurosurgery;  Laterality: N/A;  . CRANIOTOMY Left 06/29/2019   Procedure: LEFT CRANIOTOMY FOR TUMOR EXCISION;  Surgeon: Judith Part, MD;  Location: [facility];  Service: Neurosurgery;  Laterality: Left;   Social History:  reports that she has never smoked. She has never used smokeless tobacco. She reports that she does not drink alcohol or use drugs.  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 [place]: Yes Write: Yes Employment Status: Retired Special educational needs teacher Issues: Denies Guardian/Conservator: N/A   Abuse/Neglect Abuse/Neglect Assessment Can Be Completed: Yes Physical Abuse: Denies Verbal Abuse: Denies Sexual Abuse: Denies Exploitation of patient/patient's resources: Denies Self-Neglect: Denies  Emotional Status Pt's affect, behavior and adjustment status: Pt in good spirits during assessment Recent Psychosocial Issues: Denies Psychiatric History: Denies Substance Abuse History: Denies  Patient / Family Perceptions, Expectations & Goals Pt/Family understanding of illness & functional limitations: Pt family has a good understanding of pt care needs Premorbid pt/family roles/activities: Independent Anticipated changes in roles/activities/participation: Supervision with ADLs/IADLs  Recruitment consultant: None Premorbid Home Care/DME Agencies: None Transportation available at discharge: family  Discharge Planning Living Arrangements: Spouse/significant other, Children Support Systems: Spouse/significant other, Children Type of Residence: Private residence Insurance Resources: [employer] [place]specify county) Museum/gallery curator Resources: Family Support Financial Screen Referred: No Living Expenses: Own Does the patient have any problems obtaining your medications?: No Care Coordinator Anticipated Follow Up Needs: HH/OP  Clinical Impression SW met with pt and [name] in room to complete assessment who translated for pt. Plans for pt to return to her son 36 home. HCPOA- husband and son Day. No DME.   Marquavious Nazar A Semir Brill 07/12/2019, 4:05 PM

## 2019-07-12 NOTE — Plan of Care (Signed)
  Problem: Consults Goal: RH BRAIN INJURY PATIENT EDUCATION Description: Description: See Patient Education module for eduction specifics 07/12/2019 0936 by Tennis Ship, RN Outcome: Completed/Met 07/12/2019 0935 by Tennis Ship, RN Outcome: Progressing Goal: Skin Care Protocol Initiated - if Braden Score 18 or less Description: If consults are not indicated, leave blank or document N/A 07/12/2019 0936 by Tennis Ship, RN Outcome: Completed/Met 07/12/2019 0935 by Tennis Ship, RN Outcome: Progressing Goal: Nutrition Consult-if indicated 07/12/2019 0936 by Tennis Ship, RN Outcome: Completed/Met 07/12/2019 0935 by Tennis Ship, RN Outcome: Progressing   Problem: RH BOWEL ELIMINATION Goal: RH STG MANAGE BOWEL WITH ASSISTANCE Description: STG Manage Bowel with Assistance. 07/12/2019 0936 by Tennis Ship, RN Outcome: Completed/Met 07/12/2019 0935 by Tennis Ship, RN Outcome: Progressing Goal: RH STG MANAGE BOWEL W/MEDICATION W/ASSISTANCE Description: STG Manage Bowel with Medication with Assistance. 07/12/2019 0936 by Debroah Loop D, RN Outcome: Completed/Met 07/12/2019 0935 by Tennis Ship, RN Outcome: Progressing   Problem: RH BLADDER ELIMINATION Goal: RH STG MANAGE BLADDER WITH ASSISTANCE Description: STG Manage Bladder With Assistance 07/12/2019 0936 by Tennis Ship, RN Outcome: Completed/Met 07/12/2019 0935 by Tennis Ship, RN Outcome: Progressing Goal: RH STG MANAGE BLADDER WITH MEDICATION WITH ASSISTANCE Description: STG Manage Bladder With Medication With Assistance. 07/12/2019 0936 by Debroah Loop D, RN Outcome: Completed/Met 07/12/2019 0935 by Debroah Loop D, RN Outcome: Progressing   Problem: RH SKIN INTEGRITY Goal: RH STG SKIN FREE OF INFECTION/BREAKDOWN 07/12/2019 0936 by Tennis Ship, RN Outcome: Completed/Met 07/12/2019 0935 by Tennis Ship, RN Outcome:  Progressing Goal: RH STG MAINTAIN SKIN INTEGRITY WITH ASSISTANCE Description: STG Maintain Skin Integrity With Assistance. 07/12/2019 0936 by Tennis Ship, RN Outcome: Completed/Met 07/12/2019 0935 by Tennis Ship, RN Outcome: Progressing   Problem: RH SAFETY Goal: RH STG ADHERE TO SAFETY PRECAUTIONS W/ASSISTANCE/DEVICE Description: STG Adhere to Safety Precautions With Assistance/Device. 07/12/2019 0936 by Tennis Ship, RN Outcome: Completed/Met 07/12/2019 0935 by Tennis Ship, RN Outcome: Progressing Goal: RH STG DECREASED RISK OF FALL WITH ASSISTANCE Description: STG Decreased Risk of Fall With Assistance. 07/12/2019 0936 by Tennis Ship, RN Outcome: Completed/Met 07/12/2019 0935 by Tennis Ship, RN Outcome: Progressing   Problem: RH COGNITION-NURSING Goal: RH STG USES MEMORY AIDS/STRATEGIES W/ASSIST TO PROBLEM SOLVE Description: STG Uses Memory Aids/Strategies With Assistance to Problem Solve. 07/12/2019 0936 by Tennis Ship, RN Outcome: Completed/Met 07/12/2019 0935 by Tennis Ship, RN Outcome: Progressing Goal: RH STG ANTICIPATES NEEDS/CALLS FOR ASSIST W/ASSIST/CUES Description: STG Anticipates Needs/Calls for Assist With Assistance/Cues. 07/12/2019 0936 by Tennis Ship, RN Outcome: Completed/Met 07/12/2019 0935 by Debroah Loop D, RN Outcome: Progressing   Problem: RH PAIN MANAGEMENT Goal: RH STG PAIN MANAGED AT OR BELOW PT'S PAIN GOAL 07/12/2019 0936 by Tennis Ship, RN Outcome: Completed/Met 07/12/2019 0935 by Tennis Ship, RN Outcome: Progressing   Problem: RH KNOWLEDGE DEFICIT BRAIN INJURY Goal: RH STG INCREASE KNOWLEDGE OF SELF CARE AFTER BRAIN INJURY 07/12/2019 0936 by Tennis Ship, RN Outcome: Completed/Met 07/12/2019 0935 by Tennis Ship, RN Outcome: Progressing

## 2019-07-12 NOTE — Progress Notes (Signed)
[facility] PHYSICAL MEDICINE & REHABILITATION PROGRESS NOTE   Subjective/Complaints: DC home tomorrow. Still not sleeping well, likely due to [place].   ROS: limited due to language/communication   Objective:   No results found. Recent Labs    07/10/19 0611  WBC 10.7*  HGB 11.9*  HCT 36.2  PLT 348   Recent Labs    07/10/19 0611  NA 137  K 3.9  CL 103  CO2 23  GLUCOSE 127*  BUN 22  CREATININE 0.68  CALCIUM 9.5    Intake/Output Summary (Last 24 hours) at 07/12/2019 0847 Last data filed at 07/11/2019 1842 Gross per 24 hour  Intake 297 ml  Output --  Net 297 ml     Physical Exam: Vital Signs Blood pressure 105/60, pulse (!) 104, temperature 98.9 F (37.2 C), resp. rate 18, height 5\' 2"  (1.575 m), weight 54.2 kg, SpO2 93 %. Constitutional: No distress . Vital signs reviewed. Sitting up in bed comfortably with sons and husband at bedside.  HEENT: EOMI, oral membranes moist Neck: supple Cardiovascular: RRR without murmur. No JVD    Respiratory/Chest: CTA Bilaterally without wheezes or rales. Normal effort    GI/Abdomen: BS +, non-tender, non-distended Ext: no clubbing, cyanosis, or edema Psych: flat Musculoskeletal:     Comments: No edema or tenderness in extremities  Neurological: She is alert.  With son translating she follows basic commands. Slow to initiate.  Motor: Bilateral upper extremities appear to be 4/5 as are LE's.  Skin: Skin is warm and dry. Scalp incision CDI   Assessment/Plan: 1. 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  Physiatrist is providing close team supervision and 24 hour management of active medical problems listed below.  Physiatrist and rehab team continue to assess barriers to discharge/monitor patient progress toward functional and medical goals  Care Tool:  Bathing    Body parts bathed by  patient: Right arm, Left arm, Chest, Abdomen, Right upper leg, Right lower leg, Face, Left lower leg, Left upper leg, Buttocks, Front perineal area         Bathing assist Assist Level: Supervision/Verbal cueing     Upper Body Dressing/Undressing Upper body dressing   What is the patient wearing?: Pull over shirt    Upper body assist Assist Level: Supervision/Verbal cueing    Lower Body Dressing/Undressing Lower body dressing      What is the patient wearing?: Underwear/pull up, Pants     Lower body assist Assist for lower body dressing: Supervision/Verbal cueing     Toileting Toileting    Toileting assist Assist for toileting: Supervision/Verbal cueing     Transfers Chair/bed transfer  Transfers assist     Chair/bed transfer assist level: Supervision/Verbal cueing Chair/bed transfer assistive device: Programmer, multimedia   Ambulation assist      Assist level: Supervision/Verbal cueing Assistive device: Walker-rolling Max distance: 490'   Walk 10 feet activity   Assist     Assist level: Supervision/Verbal cueing Assistive device: Walker-rolling   Walk 50 feet activity   Assist    Assist level: Supervision/Verbal cueing Assistive device: Walker-rolling    Walk 150 feet activity   Assist Walk 150 feet activity did not occur: Safety/medical concerns(Per PT max distance 143 ft.due to fatigue)  Assist level: Supervision/Verbal cueing Assistive device: Walker-rolling    Walk 10 feet on uneven surface  activity   Assist     Assist  level: Contact Guard/Touching assist Assistive device: Aeronautical engineer Will patient use wheelchair at discharge?: Yes Type of Wheelchair: Optometrist)    Wheelchair assist level: Dependent - Patient 0% Max wheelchair distance: 150'    Wheelchair 50 feet with 2 turns activity    Assist        Assist Level: Dependent - Patient 0%   Wheelchair 150 feet  activity     Assist      Assist Level: Dependent - Patient 0%   Blood pressure 105/60, pulse (!) 104, temperature 98.9 F (37.2 C), resp. rate 18, height 5\' 2"  (1.575 m), weight 54.2 kg, SpO2 93 %.    Medical Problem List and Plan: 1.  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  Status post left frontal craniotomy tumor resection 06/29/2019.  Patient will follow up outpatient oncology services Dr. Marin Olp as well as radiation oncology Dr. Mickeal Skinner             -patient may shower             -ELOS/Goals: dc home today. Will have transitional care follow-up with me in 1-2 weeks.   -per rad-onc, MRI after discharge with XRT as outpt 2.  Antithrombotics: -DVT/anticoagulation: Subcutaneous Lovenox             -antiplatelet therapy: N/A 3. Pain Management: Tylenol as needed             Monitor headaches with increased exertion 4. Mood: Provide emotional support             -antipsychotic agents: N/A 5. Neuropsych: This patient is not fully capable of making decisions on her own behalf. 6. Skin/Wound Care: Routine skin checks 7. Fluids/Electrolytes/Nutrition: Routine in and outs.  CMP ordered. Ensure.  8.  Seizure disorder.  Continue Keppra 1000 mg twice daily.  EEG negative 9.  Oral thrush.  diflucan to completion 10.  Tachycardia.             ECG on 5/3 showing atrial flutter               repeated EKG  5/17 which demonstrates sinus tach 11.  Leukocytosis-likely secondary to steroids             CBC ordered. Trending downward : 10.7 5/17  12. Throat pain: prn magic mouthwash with lidocaine. See #9.   -5/18 seems improved.  .  >30 minutes spent in face-to-face and coordination of care for Mrs. Leisner, including discussion of medications, follow-up appointments, insomnia, Keppra duration.   LOS: 5 days A FACE TO FACE EVALUATION WAS PERFORMED  Clide Deutscher Sole Lengacher 07/12/2019, 8:47 AM

## 2019-07-12 NOTE — Discharge Instructions (Signed)
Inpatient Rehab Discharge Instructions  Nydia Ytuarte Discharge date and time: No discharge date for patient encounter.   Activities/Precautions/ Functional Status: Activity: activity as tolerated Diet: regular diet Wound Care: keep wound clean and dry Functional status:  ___ No restrictions     ___ Walk up steps independently ___ 24/7 supervision/assistance   ___ Walk up steps with assistance ___ Intermittent supervision/assistance  ___ Bathe/dress independently ___ Walk with walker     _x__ Bathe/dress with assistance ___ Walk Independently    ___ Shower independently ___ Walk with assistance    ___ Shower with assistance ___ No alcohol     ___ Return to work/school ________   COMMUNITY REFERRALS UPON DISCHARGE:    Home Health:   PT     OT                     Agency: Phone:     Medical Equipment/Items Ordered: transport chair, tub transfer bench, 3in1 bedside commode, youth rolling walker                                                 Agency/Supplier: [employer] [phone]    Special Instructions: No driving smoking or alcohol   My questions have been answered and I understand these instructions. I will adhere to these goals and the provided educational materials after my discharge from the hospital.  Patient/Caregiver Signature _______________________________ Date __________  Clinician Signature _______________________________________ Date __________  Please bring this form and your medication list with you to all your follow-up doctor's appointments.

## 2019-07-13 ENCOUNTER — Telehealth: Payer: Self-pay | Admitting: Radiation Therapy

## 2019-07-13 NOTE — Telephone Encounter (Signed)
I spoke with the patient's son, Day, about his mother's upcoming appointments. He would like to proceed as planned for his mother to have post operative brain radiation and SBRT lung radiation here in [place], but medical oncology care to be done in [place] since that is closer to home. Day has all the appointment information for his mother and has asked that the medical oncology consult appointment be rescheduled to a different day. He is concerned that his mother will not feel up to attending both visits and doing her planning simulation the same day.   I have left a voicemail with Melissa at [facility] requesting a call to Day to reschedule the consult with Dr. Rogue Bussing.   In addition to this information, Day has asked that social workers be involved with the consult sessions in order to provide an extra layer of support for his mother. She suffers from anxiety surrounding medical procedures and visits. Culturally they do not speak directly using terms like cancer or anxiety, but speak about having treatments instead. Culturally the discussion should be directed in positive terms to keep the patient engaged to participate in her care.   Mont Dutton R.T.(R)(T) Radiation Special Procedures Navigator

## 2019-07-14 NOTE — Progress Notes (Signed)
Inpatient Rehab Care Coordinator Discharge Note Inpatient Rehabilitation Care Coordinator  Discharge Note  The overall goal for the admission was met for:   Discharge location: Yes. D/c to home with support from husband and children.   Length of Stay: Yes. 5 days.  Discharge activity level: Yes. Supervision.   Home/community participation: Yes. Limited.  Services provided included: MD, RD, PT, OT, SLP, RN, CM, TR, Pharmacy, Neuropsych and SW  Financial Services: Medicaid  Follow-up services arranged: Unable to obtain home health due to staffing and/or insurance. Children aware there will be follow-up once [facility] is obtained.   Comments (or additional information): contact pt son Day [phone].  Patient/Family verbalized understanding of follow-up arrangements: Yes  Individual responsible for coordination of the follow-up plan: Children will help with coordinating care needs.  Confirmed correct DME delivered: Rana Snare 07/14/2019    Rana Snare

## 2019-07-17 NOTE — Progress Notes (Signed)
Thoracic Location of Tumor / Histology: Primary- adenocarcinoma of middle lobe of right lung with brain mets  Patient presented with multiple seizures, she fell at home during this time.  MRI Brain 06/30/2019: Expected postoperative changes of gross total resection of left frontal mass. Surrounding edema and mild mass effect are similar.  MRI Brain 06/27/2019:  Solitary hemorrhagic mass left frontal lobe measuring 2.2 x 2.3 x 2.8 cm. Extensive vasogenic edema. 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 Primary brain tumor not likely.  CT CAP 06/27/2019: Spiculated right middle lobe pulmonary nodule measuring 11 x 11 x 10 mm, only minimally increased in size from prior exam. This is suspicious for primary bronchogenic malignancy. Evaluation of the hila is limited in the absence of IV contrast, allowing for this limitation, no evidence of nodal metastasis. No other evidence of metastatic disease or primary malignancy in the chest, abdomen, or pelvis.  Exophytic 16 mm nodule from the thyroid isthmus, patient had previous thyroid uptake and scan at an outside institution that reported this nodule, however size was not specified, and those images are not available. No thyroid ultrasounds are available for review. Therefore, recommend thyroid US.  MRI Brain 06/27/2019: Solitary 2.6 cm left lateral frontal mass compatible with hemorrhagic neoplasm. There was a worrisome pulmonary nodule on a January 2020 chest CT, updated chest CT may be contributory.  CT Head 06/26/2019: Left frontal lobe hemorrhagic mass with surrounding vasogenic edema and 2 mm of rightward midline shift. MRI with and without contrast is recommended for further characterization.  Biopsies of Left frontal Brain 06/29/2019    Tobacco/Marijuana/Snuff/ETOH use: No  Past/Anticipated interventions by cardiothoracic surgery, if any:  Dr. Zada Finders -Craniotomy 06/29/2019    Past/Anticipated interventions by medical oncology, if any:   Dose  of Decadron, if applicable:   Recent neurologic symptoms, if any:   Seizures: no  Headaches: No  Nausea: No.  Dizziness/ataxia: She has unsteady gait.  She complains of dizziness in relation to lack of sleep.  Difficulty with hand coordination: No  Focal numbness/weakness: Has some weakness in her right leg. She has occasional numbness in her fingers in relation to lack of sleep.  Visual deficits/changes: No changes.  She has cataracts at baseline that cause some visual changes.  Confusion/Memory deficits: No  Speech is still a little impaired, son states it is not completely back to normal.  She states that    Signs/Symptoms  Weight changes, if any: She has had some weight loss since surgery.  Son states she was 140 prior to surgery.  Respiratory complaints, if any: No SOB noted.  Hemoptysis, if any: No  Pain issues, if any:  No  SAFETY ISSUES:  Prior radiation? No  Pacemaker/ICD? No  Possible current pregnancy? Postmenopausal  Is the patient on methotrexate? No

## 2019-07-18 ENCOUNTER — Encounter: Payer: Self-pay | Admitting: Radiation Oncology

## 2019-07-18 ENCOUNTER — Ambulatory Visit
Admission: RE | Admit: 2019-07-18 | Discharge: 2019-07-18 | Disposition: A | Discharge status: Home / Self Care | Payer: Medicare Other | Source: Ambulatory Visit | Attending: Radiation Oncology | Admitting: Radiation Oncology

## 2019-07-18 ENCOUNTER — Other Ambulatory Visit: Payer: Self-pay | Admitting: Radiation Oncology

## 2019-07-18 ENCOUNTER — Other Ambulatory Visit: Payer: Self-pay

## 2019-07-18 ENCOUNTER — Ambulatory Visit
Admit: 2019-07-18 | Discharge: 2019-07-18 | Disposition: A | Discharge status: Home / Self Care | Payer: Medicare Other | Attending: Radiation Oncology | Admitting: Radiation Oncology

## 2019-07-18 ENCOUNTER — Ambulatory Visit
Admit: 2019-07-18 | Discharge: 2019-07-18 | Disposition: A | Discharge status: Home / Self Care | Payer: Medicare Other | Source: Ambulatory Visit | Attending: Radiation Oncology | Admitting: Radiation Oncology

## 2019-07-18 ENCOUNTER — Ambulatory Visit: Payer: Medicare Other | Admitting: Radiation Oncology

## 2019-07-18 ENCOUNTER — Inpatient Hospital Stay: Payer: Medicare Other | Admitting: Internal Medicine

## 2019-07-18 ENCOUNTER — Inpatient Hospital Stay: Payer: Medicare Other

## 2019-07-18 VITALS — BP 117/55 | HR 102 | Temp 97.3°F | Resp 18 | Ht 62.0 in | Wt 117.2 lb

## 2019-07-18 DIAGNOSIS — C3490 Malignant neoplasm of unspecified part of unspecified bronchus or lung: Secondary | ICD-10-CM

## 2019-07-18 DIAGNOSIS — E041 Nontoxic single thyroid nodule: Secondary | ICD-10-CM | POA: Diagnosis not present

## 2019-07-18 DIAGNOSIS — C7949 Secondary malignant neoplasm of other parts of nervous system: Secondary | ICD-10-CM

## 2019-07-18 DIAGNOSIS — C3401 Malignant neoplasm of right main bronchus: Secondary | ICD-10-CM | POA: Diagnosis present

## 2019-07-18 DIAGNOSIS — C7931 Secondary malignant neoplasm of brain: Secondary | ICD-10-CM | POA: Insufficient documentation

## 2019-07-18 DIAGNOSIS — M5136 Other intervertebral disc degeneration, lumbar region: Secondary | ICD-10-CM | POA: Insufficient documentation

## 2019-07-18 DIAGNOSIS — C342 Malignant neoplasm of middle lobe, bronchus or lung: Secondary | ICD-10-CM | POA: Diagnosis not present

## 2019-07-18 DIAGNOSIS — Z79899 Other long term (current) drug therapy: Secondary | ICD-10-CM | POA: Diagnosis not present

## 2019-07-18 DIAGNOSIS — F40240 Claustrophobia: Secondary | ICD-10-CM | POA: Diagnosis not present

## 2019-07-18 DIAGNOSIS — J9601 Acute respiratory failure with hypoxia: Secondary | ICD-10-CM | POA: Diagnosis not present

## 2019-07-18 DIAGNOSIS — I700 Atherosclerosis of aorta: Secondary | ICD-10-CM | POA: Insufficient documentation

## 2019-07-18 DIAGNOSIS — Z510 Encounter for antineoplastic radiation therapy: Secondary | ICD-10-CM | POA: Insufficient documentation

## 2019-07-18 DIAGNOSIS — I10 Essential (primary) hypertension: Secondary | ICD-10-CM | POA: Diagnosis not present

## 2019-07-18 HISTORY — DX: Malignant neoplasm of unspecified part of unspecified bronchus or lung: C34.90

## 2019-07-18 MED ORDER — LORAZEPAM 0.5 MG PO TABS
ORAL_TABLET | ORAL | 0 refills | Status: DC
Start: 2019-07-18 — End: 2019-07-18

## 2019-07-18 MED ORDER — LORAZEPAM 0.5 MG PO TABS: ORAL_TABLET | ORAL | 0 refills | Status: DC

## 2019-07-18 MED ORDER — SODIUM CHLORIDE 0.9% FLUSH
10.0000 mL | Freq: Once | INTRAVENOUS | Status: AC
  Administered 2019-07-18: 10 mL via INTRAVENOUS

## 2019-07-18 MED ORDER — LORAZEPAM 0.5 MG PO TABS
0.2500 mg | ORAL_TABLET | Freq: Once | ORAL | Status: AC
  Administered 2019-07-18: 0.25 mg via ORAL
  Filled 2019-07-18: qty 0.5

## 2019-07-18 NOTE — Progress Notes (Signed)
Has armband been applied?  Yes  Does patient have an allergy to IV contrast dye?: No   Has patient ever received premedication for IV contrast dye?: n/a  Does patient take metformin?: No  If patient does take metformin when was the last dose: n/a  Date of lab work: 07/10/2019 BUN: 22 CR: 0.68 EGfr: >60  IV site: Left Forearm  Has IV site been added to flowsheet?  Yes

## 2019-07-18 NOTE — Progress Notes (Addendum)
Radiation Oncology         [phone] ________________________________  Name: Sara Schroeder        MRN: [id number]  Date of Service: 07/18/2019 DOB: [birth year]   REFERRING PHYSICIAN: Dr. 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 Diagnoses of Metastatic adenocarcinoma to brain [facility]) and Malignant neoplasm of unspecified part of unspecified bronchus or lung ([place]) were also pertinent to this visit.   HISTORY OF PRESENT ILLNESS: Sara Schroeder is a 69 y.o. female with a newly diagnosed metastatic lung cancer. The patient presented on 06/26/2019 to the emergency department after a witnessed seizure of the right extremities at home.  In the emergency department she had approximately 2-3 additional seizures.  CT scan imaging revealed a frontal mass in the left hemisphere measuring up to 2.8 cm.  Subsequent MRI with and without contrast on 06/27/19 revealed a solitary 2.6 cm mass in the left frontal lobe with extensive vasogenic edema.  CT chest abdomen pelvis that day revealed an 11 x 11 x 10 mm right middle lobe nodule as well as a 84m thyroid nodule without evidence of other distant disease but this was limited by the lack of contrast.  She subsequently underwent craniotomy with Dr. OVenetia Constableon 06/29/2019.  Follow-up MRI scans revealed what appeared to be a gross total resection, she did have some persistent edema and mild mass-effect that was similar to her prior scans, her final pathology revealed a metastatic adenocarcinoma consistent with lung primary.  [facility] and I spoke with her son Day on 07/06/19 to let him know we would like to coordinate an outpatient follow up to offer radiotherapy. She has been at home now, and was also referred to medical oncology. She will see Dr. BRogue Bussingnext Tuesday to discuss next steps for offering systemic therapy. She comes today to establish herself in  radiation clinic and her son SPricilla Holmis her medical interpretor.  PREVIOUS RADIATION THERAPY: No   PAST MEDICAL HISTORY:  Past Medical History:  Diagnosis Date  . Anxiety   . Hypertension   . Lung cancer ([place]    Right Middle Lobe       PAST SURGICAL HISTORY: Past Surgical History:  Procedure Laterality Date  . APPLICATION OF CRANIAL NAVIGATION N/A 06/29/2019   Procedure: APPLICATION OF CRANIAL NAVIGATION;  Surgeon: OJudith Part MD;  [facility]  Service: Neurosurgery;  Laterality: N/A;  . CRANIOTOMY Left 06/29/2019   Procedure: LEFT CRANIOTOMY FOR TUMOR EXCISION;  Surgeon: OJudith Part MD;  [facility]  Service: Neurosurgery;  Laterality: Left;     FAMILY HISTORY: History reviewed. No pertinent family history.   SOCIAL HISTORY:  reports that she has never smoked. She has never used smokeless tobacco. She reports that she does not drink alcohol or use drugs. [place]. Her son SRemo Lippsis a mHospital doctorwho works through the [employer]and provides her interpreting today. Of note, it has been made clear to u[place] her son Day and by social work that the idea and vernacular of "cancer" is not a recognized concept in her culture. 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   ALLERGIES: Lisinopril   MEDICATIONS:  Current Outpatient Medications  Medication Sig Dispense Refill  . acetaminophen (TYLENOL) 325 MG tablet Take 2 tablets (650 mg total) by mouth every 4 (four) hours as needed for mild pain (temp > 101.5).    . fluconazole (DIFLUCAN) 100 MG tablet Take 1 tablet (100 mg total) by mouth daily for 10 days. 5 tablet 0  . levETIRAcetam (KEPPRA) 1000 MG tablet Take 1 tablet (1,000 mg  total) by mouth 2 (two) times daily. 60 tablet 0  . Multiple Vitamin (MULTIVITAMIN WITH MINERALS) TABS tablet Take 1 tablet by mouth daily. 60 tablet 0  . omeprazole (PRILOSEC) 20 MG capsule Take 1 capsule (20 mg total) by mouth daily. 30 capsule 0  . pravastatin (PRAVACHOL) 20 MG tablet Take 1 tablet (20 mg total) by mouth every evening. 30 tablet 0  . traZODone (DESYREL) 50 MG tablet Take 2 tablets (100 mg total) by mouth at bedtime. 30 tablet 0  . docusate sodium (COLACE) 100 MG capsule Take 1 capsule (100 mg total) by mouth 2 (two) times daily. (Patient not taking: Reported on 07/18/2019) 10 capsule 0   No current facility-administered medications for this encounter.     REVIEW OF SYSTEMS: On review of systems, the patient reports that she is feeling much better since recovering from surgery. She denies any concerns about movement or strength and her son states she has regained her strength especially on the right side. She is off of steroids, but continues Keppra. She has cataracts and her visual acuity has suffered but she previously refused cataract removal. She does have some claustrophobia in small spaces but does not recall her MRI due to medication at the time of her seizures. No other complaints are noted.     PHYSICAL EXAM:  Wt Readings from Last 3 Encounters:  07/18/19 117 lb 4 oz (53.2 kg)  07/12/19 119 lb 7.8 oz (54.2 kg)  07/03/19 135 lb 12.9 oz (61.6 kg)   Temp Readings from Last 3 Encounters:  07/18/19 (!) 97.3 F (36.3 C) (Temporal)  07/12/19 98.9 F (37.2 C)  07/07/19 99 F (37.2 C)   BP Readings from Last 3 Encounters:  07/18/19 (!) 117/55  07/12/19 105/60  07/07/19 129/74   Pulse Readings from Last 3 Encounters:  07/18/19 (!) 102  07/12/19 (!) 104  07/07/19 (!) 102   Pain Assessment Pain Score: 0-No pain/10  In general this is a well appearing Asian female in no acute distress. She's alert and oriented x4 and appropriate throughout the examination. Her  craniotomy incision site is healing well without erythema or separation. Cardiopulmonary assessment is negative for acute distress and she exhibits normal effort.    ECOG = 1  0 - Asymptomatic (Fully active, able to carry on all predisease activities without restriction)  1 - Symptomatic but completely ambulatory (Restricted in physically strenuous activity but ambulatory and able to carry out work of a light or sedentary nature. For example, light housework, office work)  2 - Symptomatic, <50% in bed during the day (Ambulatory and capable of all self care but unable to carry out any work activities. Up and about more than 50% of waking hours)  3 - Symptomatic, >50% in bed, but not bedbound (Capable of only limited self-care, confined to bed or chair 50% or more of waking hours)  4 - Bedbound (Completely disabled. Cannot carry on any self-care. Totally confined to bed or chair)  5 - Death   Sara Schroeder MM, Creech RH, Tormey DC, et al. [phone]). "Toxicity and response criteria of the [facility]". [place] Oncol. 5 (6): 649-55    LABORATORY DATA:  Lab Results  Component Value Date   WBC 10.7 (H) 07/10/2019   HGB 11.9 (L) 07/10/2019   HCT 36.2 07/10/2019   MCV 94.0 07/10/2019   PLT 348 07/10/2019   Lab Results  Component Value Date   NA 137 07/10/2019   K 3.9 07/10/2019   CL 103 07/10/2019   CO2 23 07/10/2019   Lab Results  Component Value Date   ALT 58 (H) 07/10/2019   AST 27 07/10/2019   ALKPHOS 70 07/10/2019   BILITOT 0.7 07/10/2019      RADIOGRAPHY: EEG  Result Date: 06/27/2019 Lora Havens, MD     06/27/2019  9:56 AM Patient Name: Sarann Tregre MRN: [id number] Epilepsy Attending: Lora Havens Referring Physician/Provider: Eliseo Gum, NP Date: 06/27/2019 Duration: 28.21 minutes Patient history: 69 year old female presented with seizure-like episode and was found to have left frontal lobe hemorrhagic mass.  EEG evaluate for seizures. Level of  alertness: Comatose AEDs during EEG study: Keppra, propofol Technical aspects: This EEG study was done with scalp electrodes positioned according to the 10-20 International system of electrode placement. Electrical activity was acquired at a sampling rate of 500Hz  and reviewed with a high frequency filter of 70Hz  and a low frequency filter of 1Hz . EEG data were recorded continuously and digitally stored. Description: EEG showed an excessive amount of 15 to 18 Hz, sharply contoured beta activity with irregular morphology distributed symmetrically and diffusely.   Intermittent generalized background attenuation was also noted. Hyperventilation and photic stimulation were not performed. Abnormality -Excessive beta, generalized -Background attenuation, generalized IMPRESSION: This study is suggestive of severe to profound diffuse encephalopathy, nonspecific etiology but most likely secondary to sedation. No seizures or definite epileptiform discharges were seen throughout the recording. Priyanka [name] [name]   CT ABDOMEN PELVIS WO CONTRAST  Result Date: 06/27/2019 CLINICAL DATA:  Brain mass. Lung nodule. EXAM: CT CHEST, ABDOMEN AND PELVIS WITHOUT CONTRAST TECHNIQUE: Multidetector CT imaging of the chest, abdomen and pelvis was performed following the standard protocol without IV contrast. COMPARISON:  Chest radiograph yesterday. CT chest abdomen pelvis 03/01/2018 FINDINGS: CT CHEST FINDINGS Cardiovascular: Aortic atherosclerosis. No aortic aneurysm. Heart is normal in size. No pericardial effusion. Mediastinum/Nodes: Accurate assessment for hilar adenopathy is limited in the absence of IV contrast. There are no enlarged mediastinal lymph nodes. Endotracheal tube with tip above the carina. Enteric tube decompresses the esophagus. There is a 16 mm nodule that abuts the isthmus of the thyroid, likely unchanged from prior, on the previous exam portions of this region obscured by streak artifact from IV contrast. There is no  axillary adenopathy. Lungs/Pleura: Spiculated right middle lobe pulmonary nodule measures 11 x 11 x 10 mm, only minimally increased in size from prior exam. Margins are irregular and spiculated with small spiculation extending to the pleural surface anteriorly, minimal pleural thickening. No other pulmonary nodule or mass. Dependent opacities in the lung bases favor atelectasis. Trace pleural thickening without significant effusion. Musculoskeletal: No blastic or evidence of destructive lytic lesion. Ordinary degenerative change in the spine. No obvious breast mass. CT ABDOMEN PELVIS FINDINGS Hepatobiliary: Lack of IV contrast limits assessment for focal liver lesion. Capsular calcification involving the dome. No evidence of focal hepatic lesion. Gallbladder physiologically distended, no calcified stone. No biliary dilatation. Pancreas: No ductal dilatation or inflammation.  No evidence of focal lesion on this noncontrast exam. Spleen: Normal in size without focal abnormality. Adrenals/Urinary Tract: Slight adrenal thickening without renal nodule. No hydronephrosis. Mild prominence of both renal collecting systems. No evidence of focal renal lesion on noncontrast exam. Urinary bladder is decompressed by Foley catheter. Stomach/Bowel: Enteric tube tip in the stomach. Contrast and fluid in the stomach without evidence of focal gastric mass. No small bowel wall thickening or inflammation. No obstruction, administered enteric contrast reaches the colon. Normal contrast filled appendix. Moderate stool distally. Vascular/Lymphatic: Aortic atherosclerosis. No aortic aneurysm. No enlarged lymph nodes in the abdomen or pelvis, detailed assessment limited in the absence of IV contrast. Small calcified lymph node adjacent to the proximal sigmoid colon, suggesting prior granulomatous disease. Reproductive: Uterus and bilateral adnexa are unremarkable. No evidence of adnexal mass. Other: No ascites or free fluid. No omental  thickening. Musculoskeletal: No blastic or destructive lytic lesions. Transitional lumbosacral anatomy. Degenerative disc disease at L4-L5. There are no acute or suspicious osseous abnormalities. Calcified granuloma in the subcutaneous soft tissues. No suspicious skin or subcutaneous lesion. IMPRESSION: 1. Spiculated right middle lobe pulmonary nodule measuring 11 x 11 x 10 mm, only minimally increased in size from prior exam. This is suspicious for primary bronchogenic malignancy. Evaluation of the hila is limited in the absence of IV contrast, allowing for this limitation, no evidence of nodal metastasis. 2. No other evidence of metastatic disease or primary malignancy in the chest, abdomen, or pelvis. 3. Exophytic 16 mm nodule from the thyroid isthmus, patient had previous thyroid uptake and scan at an outside institution that reported this nodule, however size was not specified, and those images are not available. No thyroid ultrasounds are available for review. Therefore, recommend thyroid US (ref: J Am Coll Radiol. 2015 Feb;12(2): 143-50), unless performed elsewhere. 4. Dependent opacities in the lung bases favor atelectasis. Aortic Atherosclerosis (ICD10-I70.0). Electronically Signed   By: Keith Rake M.D.   On: 06/27/2019 16:56   CT Head Wo Contrast  Result Date: 06/26/2019 CLINICAL DATA:  Seizure EXAM: CT HEAD WITHOUT CONTRAST TECHNIQUE: Contiguous axial images were obtained from the base of the skull through the vertex without intravenous contrast. COMPARISON:  None. FINDINGS: Brain: There is hemorrhage in the left frontal lobe measuring 2.7 x 2.0 x 2.8 cm with a large amount surrounding vasogenic edema. This is likely a mass. Brain parenchyma is otherwise normal. There is no herniation. 2 mm of rightward midline shift. Vascular: Negative Skull: Normal Sinuses/Orbits: Clear sinuses. Normal orbits. Other: None IMPRESSION: Left frontal lobe hemorrhagic mass with surrounding vasogenic edema and 2 mm  of rightward midline shift. MRI with and without contrast is recommended for further characterization. Critical Value/emergent results were called by telephone at the time of interpretation on 06/26/2019 at 8:39 pm to provider [facility] , who verbally acknowledged these results. Electronically Signed   By: Ulyses Jarred M.D.   On: 06/26/2019 20:39   CT CHEST WO CONTRAST  Result Date: 06/27/2019 CLINICAL DATA:  Brain mass. Lung nodule. EXAM: CT CHEST, ABDOMEN AND PELVIS WITHOUT CONTRAST TECHNIQUE: Multidetector CT imaging of the chest, abdomen and pelvis was performed following the standard protocol without IV contrast. COMPARISON:  Chest radiograph yesterday. CT chest abdomen pelvis 03/01/2018 FINDINGS: CT CHEST FINDINGS Cardiovascular: Aortic atherosclerosis. No aortic aneurysm. Heart is normal in size. No pericardial effusion. Mediastinum/Nodes: Accurate assessment for hilar adenopathy is limited in the absence of IV contrast. There are no enlarged mediastinal lymph nodes. Endotracheal tube with tip above the carina.  Enteric tube decompresses the esophagus. There is a 16 mm nodule that abuts the isthmus of the thyroid, likely unchanged from prior, on the previous exam portions of this region obscured by streak artifact from IV contrast. There is no axillary adenopathy. Lungs/Pleura: Spiculated right middle lobe pulmonary nodule measures 11 x 11 x 10 mm, only minimally increased in size from prior exam. Margins are irregular and spiculated with small spiculation extending to the pleural surface anteriorly, minimal pleural thickening. No other pulmonary nodule or mass. Dependent opacities in the lung bases favor atelectasis. Trace pleural thickening without significant effusion. Musculoskeletal: No blastic or evidence of destructive lytic lesion. Ordinary degenerative change in the spine. No obvious breast mass. CT ABDOMEN PELVIS FINDINGS Hepatobiliary: Lack of IV contrast limits assessment for focal liver  lesion. Capsular calcification involving the dome. No evidence of focal hepatic lesion. Gallbladder physiologically distended, no calcified stone. No biliary dilatation. Pancreas: No ductal dilatation or inflammation. No evidence of focal lesion on this noncontrast exam. Spleen: Normal in size without focal abnormality. Adrenals/Urinary Tract: Slight adrenal thickening without renal nodule. No hydronephrosis. Mild prominence of both renal collecting systems. No evidence of focal renal lesion on noncontrast exam. Urinary bladder is decompressed by Foley catheter. Stomach/Bowel: Enteric tube tip in the stomach. Contrast and fluid in the stomach without evidence of focal gastric mass. No small bowel wall thickening or inflammation. No obstruction, administered enteric contrast reaches the colon. Normal contrast filled appendix. Moderate stool distally. Vascular/Lymphatic: Aortic atherosclerosis. No aortic aneurysm. No enlarged lymph nodes in the abdomen or pelvis, detailed assessment limited in the absence of IV contrast. Small calcified lymph node adjacent to the proximal sigmoid colon, suggesting prior granulomatous disease. Reproductive: Uterus and bilateral adnexa are unremarkable. No evidence of adnexal mass. Other: No ascites or free fluid. No omental thickening. Musculoskeletal: No blastic or destructive lytic lesions. Transitional lumbosacral anatomy. Degenerative disc disease at L4-L5. There are no acute or suspicious osseous abnormalities. Calcified granuloma in the subcutaneous soft tissues. No suspicious skin or subcutaneous lesion. IMPRESSION: 1. Spiculated right middle lobe pulmonary nodule measuring 11 x 11 x 10 mm, only minimally increased in size from prior exam. This is suspicious for primary bronchogenic malignancy. Evaluation of the hila is limited in the absence of IV contrast, allowing for this limitation, no evidence of nodal metastasis. 2. No other evidence of metastatic disease or primary  malignancy in the chest, abdomen, or pelvis. 3. Exophytic 16 mm nodule from the thyroid isthmus, patient had previous thyroid uptake and scan at an outside institution that reported this nodule, however size was not specified, and those images are not available. No thyroid ultrasounds are available for review. Therefore, recommend thyroid US (ref: J Am Coll Radiol. 2015 Feb;12(2): 143-50), unless performed elsewhere. 4. Dependent opacities in the lung bases favor atelectasis. Aortic Atherosclerosis (ICD10-I70.0). Electronically Signed   By: Keith Rake M.D.   On: 06/27/2019 16:56   MR BRAIN W WO CONTRAST  Result Date: 06/30/2019 CLINICAL DATA:  Post mass resection EXAM: MRI HEAD WITHOUT AND WITH CONTRAST TECHNIQUE: Multiplanar, multiecho pulse sequences of the brain and surrounding structures were obtained without and with intravenous contrast. CONTRAST:  60m GADAVIST GADOBUTROL 1 MMOL/ML IV SOLN COMPARISON:  06/27/2019 FINDINGS: Brain: Post resection of left mass with thin extra-axial collection and resection cavity containing fluid and blood products. There is reduced diffusion at the deep margin likely reflecting postoperative contusion. There is no residual nodular enhancement. Associated edema and regional mass effect are similar. Vascular: Major vessel  flow voids at the skull base are preserved. Skull and upper cervical spine: Post left frontal craniotomy. Normal marrow signal is preserved. Sinuses/Orbits: Paranasal sinuses are aerated. Orbits are unremarkable. Other: Sella is unremarkable.  Mastoid air cells are clear. IMPRESSION: Expected postoperative changes of gross total resection of left frontal mass. Surrounding edema and mild mass effect are similar. Electronically Signed   By: Macy Mis M.D.   On: 06/30/2019 08:08   MR BRAIN W WO CONTRAST  Result Date: 06/27/2019 CLINICAL DATA:  Brain mass.  History of lung nodule. EXAM: MRI HEAD WITHOUT AND WITH CONTRAST TECHNIQUE: Multiplanar,  multiecho pulse sequences of the brain and surrounding structures were obtained without and with intravenous contrast. CONTRAST:  5.32m GADAVIST GADOBUTROL 1 MMOL/ML IV SOLN COMPARISON:  CT head 06/26/2019. MRI head without with contrast 06/27/2019 FINDINGS: Brain: SRS protocol with thin sections performed on 3 tesla MRI. Solitary mass left frontal lobe with evidence of internal hemorrhage. The mass measures 2.2 x 2.3 x 2.8 cm. There is extensive vasogenic edema in the white matter. This is causing mass-effect and mild midline shift to the right of approximately 2 mm. Ventricle size normal. No second lesion identified. Mild chronic microvascular ischemic change in the white matter. Negative for acute infarct. Vascular: Normal arterial flow voids Skull and upper cervical spine: No focal skeletal lesion. Sinuses/Orbits: Mild mucosal edema paranasal sinuses. Bilateral orbits Other: None IMPRESSION: Solitary hemorrhagic mass left frontal lobe measuring 2.2 x 2.3 x 2.8 cm. Extensive vasogenic edema. 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 Primary brain tumor not likely. Electronically Signed   By: CFranchot GalloM.D.   On: 06/27/2019 17:43   MR BRAIN W WO CONTRAST  Result Date: 06/27/2019 CLINICAL DATA:  Mass follow-up EXAM: MRI HEAD WITHOUT AND WITH CONTRAST TECHNIQUE: Multiplanar, multiecho pulse sequences of the brain and surrounding structures were obtained without and with intravenous contrast. CONTRAST:  61mGADAVIST GADOBUTROL 1 MMOL/ML IV SOLN COMPARISON:  Head CT from yesterday FINDINGS: Brain: 2.6 cm left lateral frontal mass which appears intra-axial. The mass is T2 hypointense and mildly dense by CT. There is patchy T1 shortening compatible with blood products based on gradient imaging. There is prominent adjacent vasogenic edema. The mass is diffusely enhancing and not a simple hematoma. No second mass is seen.  No infarct, hydrocephalus, or collection. Vascular: Normal flow voids and vascular  enhancements Skull and upper cervical spine: Normal marrow signal Sinuses/Orbits: Negative IMPRESSION: 1. 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 There was a worrisome pulmonary nodule on a January 2020 chest CT, updated chest CT may be contributory. 2. Extensive vasogenic edema. Electronically Signed   By: JoMonte Fantasia.D.   On: 06/27/2019 08:12   DG CHEST PORT 1 VIEW  Result Date: 07/05/2019 CLINICAL DATA:  [place]. Additional provided: Patient recently presented with seizure was found to have a large hemorrhagic left frontal lobe mass. EXAM: PORTABLE CHEST 1 VIEW COMPARISON:  Chest radiograph 06/28/2019, chest CT 06/27/2019 FINDINGS: Heart size within normal limits. Mild ill-defined opacity at the right lung base. Redemonstrated right middle lobe lung nodule better characterized on chest CT 06/27/2019. The left lung is clear. No evidence of pleural effusion or pneumothorax. No acute bony abnormality identified. IMPRESSION: Mild ill-defined opacity at the right lung base which may reflect atelectasis or pneumonia. Redemonstrated right middle lobe pulmonary nodule better characterized on prior chest CT 06/27/2019. Electronically Signed   By: KyKellie SimmeringO   On: 07/05/2019 11:06  DG CHEST PORT 1 VIEW  Result Date: 06/28/2019 CLINICAL DATA:  Acute hypoxemic respiratory failure EXAM: PORTABLE CHEST 1 VIEW COMPARISON:  CT 06/27/2019 FINDINGS: *Endotracheal tube in the mid trachea 3 cm from the carina. *Transesophageal tube tip and side port distal to the GE junction curling in the left upper quadrant. *Telemetry leads and support devices overlie the chest. Redemonstration of a right middle lobe pulmonary nodule better seen on comparison CT. Mild basilar atelectatic changes. No acute consolidative opacity or convincing features of edema. No pneumothorax or effusion. Cardiomediastinal contours are stable. No acute osseous or soft tissue abnormality. Degenerative changes  are present in the imaged spine and shoulders. IMPRESSION: 1. Mild basilar atelectatic changes. 2. Redemonstration of right middle lobe pulmonary nodule better seen on comparison CT. 3. Lines and tubes as above. Electronically Signed   By: Lovena Le M.D.   On: 06/28/2019 06:27   DG Chest Port 1 View  Result Date: 06/27/2019 CLINICAL DATA:  Intubation. EXAM: PORTABLE CHEST 1 VIEW COMPARISON:  Chest x-ray 06/26/2019, 03/01/2018. FINDINGS: Endotracheal tube, NG tube in good anatomic position. NG tube has been advanced its tip and side hole in the stomach. Heart size normal. As previously noted on prior study of 06/26/2018 right lower lobe nodule appears larger. Again further evaluation with chest CT is suggested. Mild left base subsegmental atelectasis. No pleural effusion or pneumothorax. IMPRESSION: 1. Lines and tubes in good anatomic position. NG tube has been advanced its tip and side hole are within the stomach. 2. As noted on prior study of 06/26/2018 right lower lobe pulmonary nodule appears larger. Again further evaluation with chest CT suggested. These results will be called to the ordering clinician or representative by the Radiologist Assistant, and communication documented in the PACS or [employer]. Electronically Signed   By: Marcello Moores  Register   On: 06/27/2019 08:42   DG Chest Portable 1 View  Result Date: 06/26/2019 CLINICAL DATA:  69 year old female status post intubation. EXAM: PORTABLE CHEST 1 VIEW COMPARISON:  Earlier radiograph dated 06/26/2019. FINDINGS: Endotracheal tube with tip approximately 18 mm above the carina. Recommend retraction by 2-3 cm for optimal positioning. Enteric tube with side port above the GE junction. Recommend further advancing by additional 8 cm. No other interval change. IMPRESSION: 1. Endotracheal tube above the carina. 2. Enteric tube with side port above the GE junction. Recommend further advancing by additional 8 cm. Electronically Signed   By: Anner Crete M.D.   On: 06/26/2019 21:27   DG Chest Portable 1 View  Result Date: 06/26/2019 CLINICAL DATA:  Fever EXAM: PORTABLE CHEST 1 VIEW COMPARISON:  03/01/2018 FINDINGS: The heart size and mediastinal contours are within normal limits. Right lower lung nodule may be slightly increased, now measuring 14 mm. The visualized skeletal structures are unremarkable. IMPRESSION: No active disease. Suspected slight increase in size of right lower lung nodule. Follow-up chest CT is recommended. Electronically Signed   By: Donavan Foil M.D.   On: 06/26/2019 20:48   EEG adult  Result Date: 06/30/2019 Lora Havens, MD     07/01/2019  8:50 AM Patient Name: Aubriegh Minch MRN: [id number] Epilepsy Attending: Lora Havens Referring Physician/Provider:  Dr. 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  EEG evaluate for seizures.  Level of alertness:  Awake  AEDs during EEG study: Keppra  Technical aspects: This EEG study was done with scalp electrodes  positioned according to the 10-20 International system of electrode placement. Electrical activity was acquired at a sampling rate of 500Hz  and reviewed with a high frequency filter of 70Hz  and a low frequency filter of 1Hz . EEG data were recorded continuously and digitally stored.  Description:  No clear posterior dominant rhythm was seen.  EEG showed continuous generalized polymorphic 5 to 9 Hz theta-alpha activity.  Hyperventilation and photic stimulation were not performed. Of note, EEG was technically difficult due to significant eye flutter artifact.  Abnormality -Continuous slow, generalized  IMPRESSION: This technically difficult study suggestive of moderate diffuse encephalopathy, nonspecific etiology. No seizures or definite epileptiform discharges were seen throughout the recording. Priyanka [name] [name]   Overnight EEG with  video  Result Date: 07/01/2019 Lora Havens, MD     07/02/2019  9:21 AM Patient Name:Isobel Sheetz ATF:[id number] Epilepsy Attending:Priyanka [name] [name] Referring Physician/Provider: Dr. Emelda Brothers Duration:06/30/2019 [phone] to 07/01/2019 1223  Patient history:69 year old female presented with seizure-like episode and was found to have left frontal lobe hemorrhagic mass. EEG evaluate for seizures.  Level of alertness: awake, asleep  AEDs during EEG study:Keppra  Technical aspects: This EEG study was done with scalp electrodes positioned according to the 10-20 International system of electrode placement. Electrical activity was acquired at a sampling rate of 500Hz  and reviewed with a high frequency filter of 70Hz  and a low frequency filter of 1Hz . EEG data were recorded continuously and digitally stored.  Description: No clear posterior dominant rhythm was seen. Sleep was characterized by vertex waves, sleep spindles (12-14hz ), maximal frontocentral region. EEG showed continuous generalized and lateralized left frontotemporal region  3-6 Hz theta-delta slowing.  Hyperventilation and photic stimulation were not performed.  Abnormality -Continuous slow, generalized and lateralized left frontotemporal region  IMPRESSION: This study suggestive of cortical dysfunction in left frontotemporal region consistent with underlying structural abnormality. Additionally, there is evidence of moderate diffuse encephalopathy, nonspecific etiology.No seizures ordefiniteepileptiform discharges were seen throughout the recording.  Lora Havens   US THYROID  Result Date: 06/28/2019 CLINICAL DATA:  Incidental on CT. Nodule of thyroid isthmus by CT of the chest. EXAM: THYROID ULTRASOUND TECHNIQUE: Ultrasound examination of the thyroid gland and adjacent soft tissues was performed. COMPARISON:  CT of the chest on 06/27/2019 FINDINGS: Parenchymal Echotexture: Mildly heterogenous Isthmus: 1.0 cm Right lobe: 4.4 x 1.7  x 1.2 cm Left lobe: 4.3 x 1.9 x 1.3 cm _________________________________________________________ Estimated total number of nodules >/= 1 cm: 1 Number of spongiform nodules >/=  2 cm not described below (TR1): 0 Number of mixed cystic and solid nodules >/= 1.5 cm not described below (TR2): 0 _________________________________________________________ Nodule # 1: Location: Isthmus Maximum size: 1.6 cm; Other 2 dimensions: 1.4 x 1.1 cm Composition: solid/almost completely solid (2) Echogenicity: hypoechoic (2) Shape: not taller-than-wide (0) Margins: ill-defined (0) Echogenic foci: none (0) ACR TI-RADS total points: 4. ACR TI-RADS risk category: TR4 (4-6 points). ACR TI-RADS recommendations: **Given size (>/= 1.5 cm) and appearance, fine needle aspiration of this moderately suspicious nodule should be considered based on TI-RADS criteria. _________________________________________________________ Simple cyst in the inferior left lobe measures 0.9 cm in greatest diameter and has a benign appearance. No abnormal lymph nodes identified IMPRESSION: 1.6 cm isthmus nodule technically meets criteria for fine-needle aspiration. The nodule is borderline in size for biopsy and 1 year follow-up ultrasound could also be considered. The above is in keeping with the ACR TI-RADS recommendations - J Am Coll Radiol 2017;14:587-595. Electronically Signed   By: Jenness Corner.D.  On: 06/28/2019 15:41       IMPRESSION/PLAN: 1. 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 Dr. Lisbeth Renshaw discusses the pathology findings and reviews the nature of metastatic lung disease to the brain. He reviews the imaging findings to date and also recommends proceeding with a PET scan for further staging.  We reviewed the rationale to also proceed with MRI of the brain for purposes of planning her treatments. The patient would be a good candidate for postoperative stereotactic radiosurgery [facility]) to the brain in 3  fractions, along with stereotactic body radiotherapy (SBRT) to her primary lung tumor. The rationale for this would be the assumption that this is an EGFR mutated cancer, and that she would have a great response to therapy if this were the case. I called pathology and they do not have any further molecular testing ordered. I'll reach out to medical oncology and see if we can add on any further testing to make their conversation more meaningful next week. We discussed the risks, benefits, short, and long term effects of stereotactic approach to radiotherapy, and the patient is interested in proceeding. Dr. Lisbeth Renshaw discusses the delivery and logistics of radiotherapy and she will proceed with simulation today for her chest and for her brain. She will follow up with Dr. Zada Finders this week and see Dr. Rogue Bussing next Tuesday.  2. Claustrophobia. The patient does have some concerns with claustrophobia and a dose of .25 mg of Ativan was given x1 today in clinic prior to her simulation. A new prescription was also sent in for this dose taking into consideration her weight and blood pressure, and the side effect profile was also reviewed with her.   In a visit lasting 90 minutes, greater than 50% of the time was spent face to face discussing the patient's condition, in preparation for the discussion, and coordinating the patient's care.   The above documentation reflects my direct findings during this shared patient visit. Please see the separate note by Dr. Lisbeth Renshaw on this date for the remainder of the patient's plan of care.    Carola Rhine, PAC

## 2019-07-19 ENCOUNTER — Ambulatory Visit (HOSPITAL_COMMUNITY)
Admission: RE | Admit: 2019-07-19 | Discharge: 2019-07-19 | Disposition: A | Discharge status: Home / Self Care | Payer: Medicare Other | Source: Ambulatory Visit | Attending: Radiation Oncology | Admitting: Radiation Oncology

## 2019-07-19 DIAGNOSIS — C7949 Secondary malignant neoplasm of other parts of nervous system: Secondary | ICD-10-CM | POA: Insufficient documentation

## 2019-07-19 DIAGNOSIS — C7931 Secondary malignant neoplasm of brain: Secondary | ICD-10-CM | POA: Insufficient documentation

## 2019-07-19 IMAGING — MR MR HEAD WO/W CM
9 of 11 series · 25 of 48 positions shown · IV contrast (gadavist)
Comparison: MRI of the brain [DATE]

CLINICAL DATA: CNS neoplasm status post treatment.

EXAM:
MRI HEAD WITHOUT AND WITH CONTRAST
TECHNIQUE: Multiplanar, multiecho pulse sequences of the brain and surrounding
structures were obtained without and with intravenous contrast.
CONTRAST:  5mL GADAVIST GADOBUTROL 1 MMOL/ML IV SOLN

[Series 3: FLAIR · sagittal · 3.0mm · 0.47mm/px · 2 of 39 slices shown (1 of 2)]
[im 1/39]
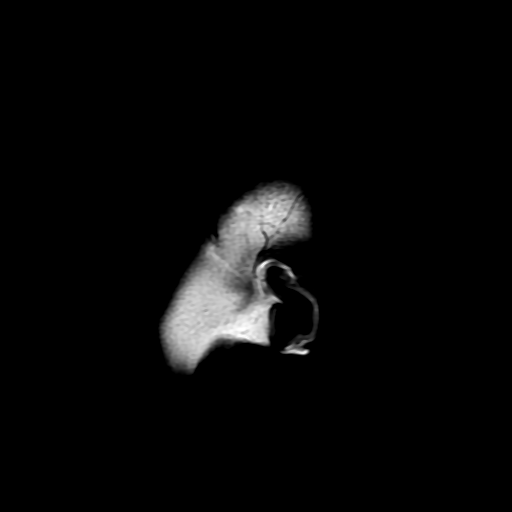
[im 39/39]
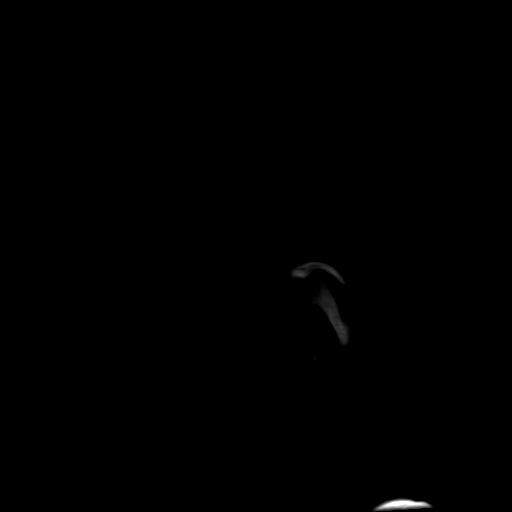

[Series 4: DWI · axial · 3.0mm · 0.94mm/px · z∈[-81,+67]mm · 6 of 102 slices shown]
[im 1/102]
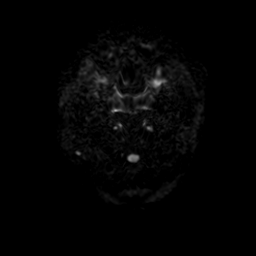
[im 21/102]
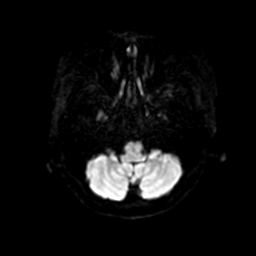
[im 41/102]
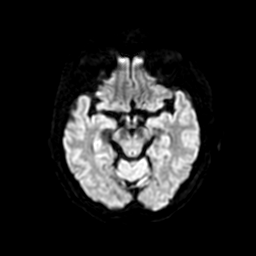
[im 61/102]
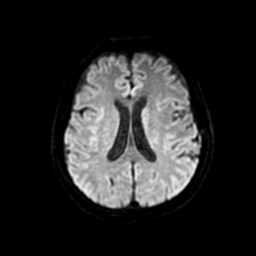
[im 81/102]
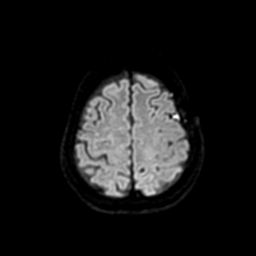
[im 102/102]
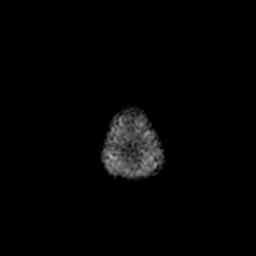

[Series 5: T2 · axial · 5.0mm · 0.47mm/px · z∈[-82,+73]mm · 2 of 27 slices shown]
[im 1/27]
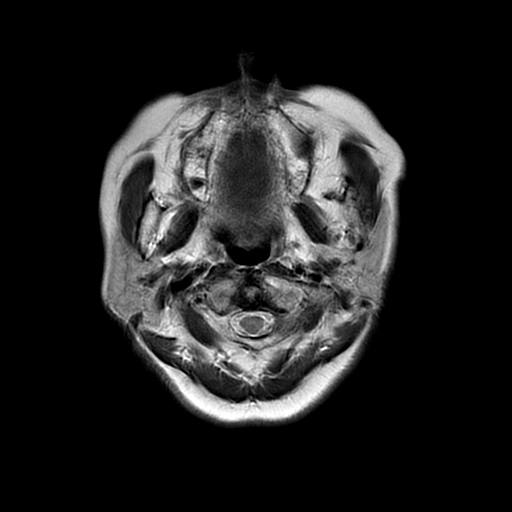
[im 27/27]
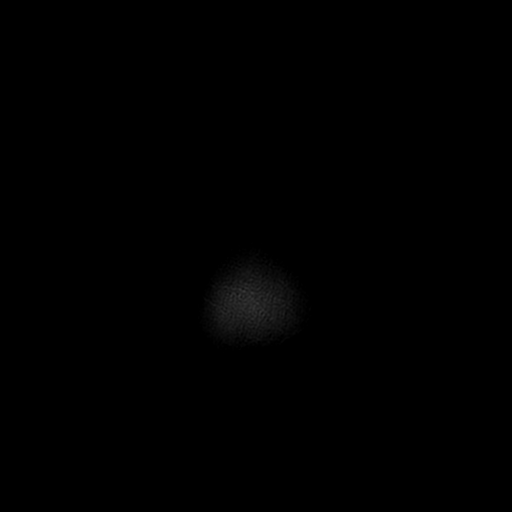

[Series 6: FLAIR · axial · 3.0mm · 0.47mm/px · z∈[-58,+104]mm · 3 of 55 slices shown (2 of 2)]
[im 1/55]
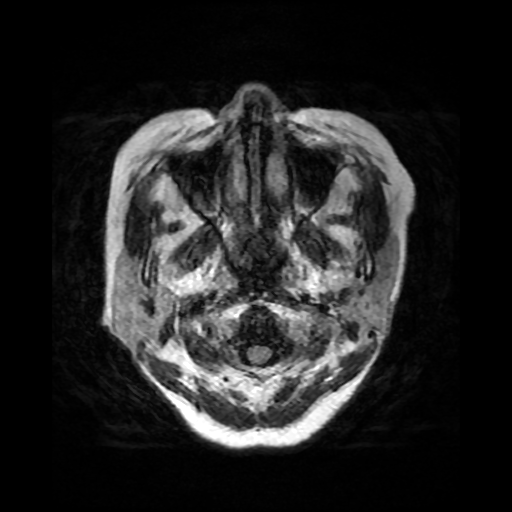
[im 28/55]
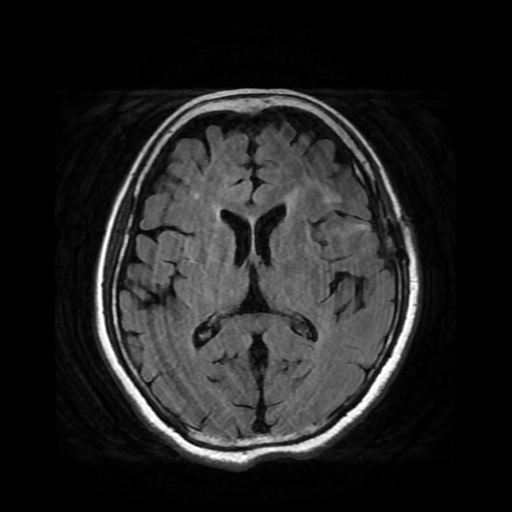
[im 55/55]
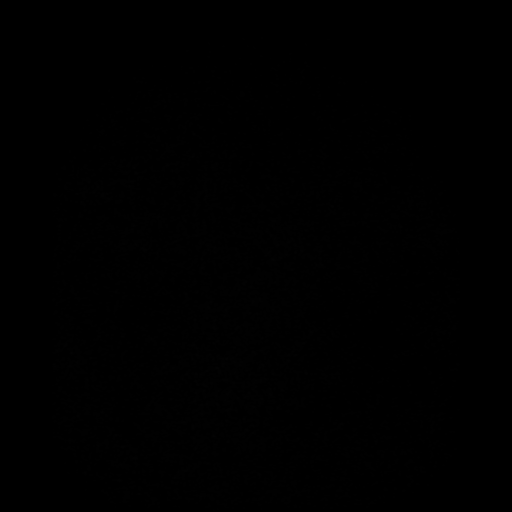

[Series 7: SWI · axial · 3.0mm · 0.47mm/px · z∈[-97,-68]mm · 2 of 116 slices shown]
[im 1/116]
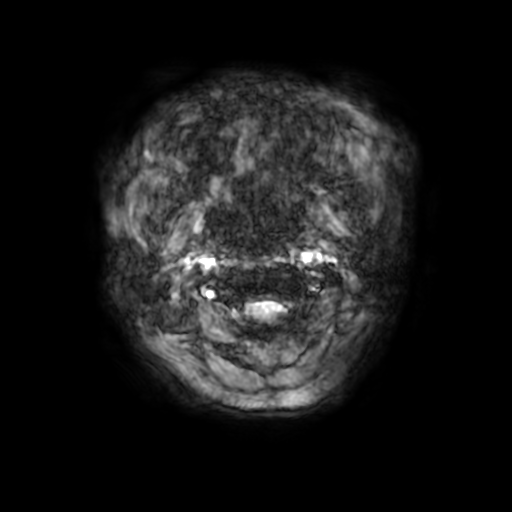
[im 20/116]
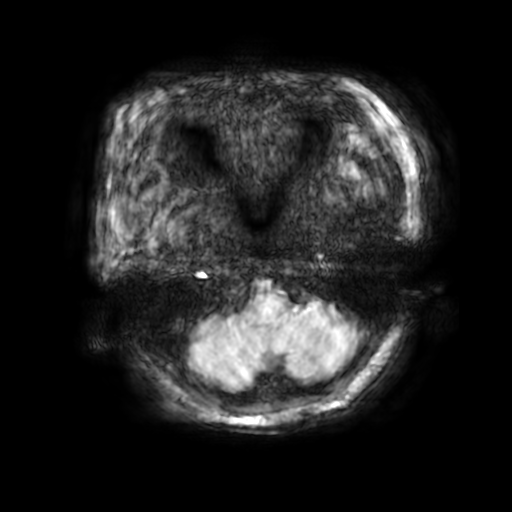

[Series 13: T2 post-contrast · coronal · 3.0mm · 0.39mm/px · 3 of 45 slices shown]
[im 1/45]
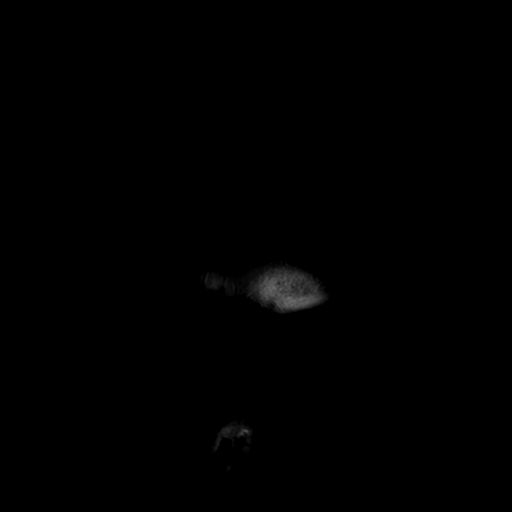
[im 23/45]
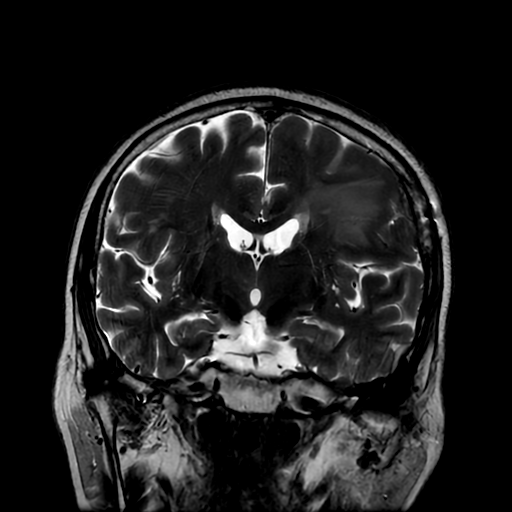
[im 45/45]
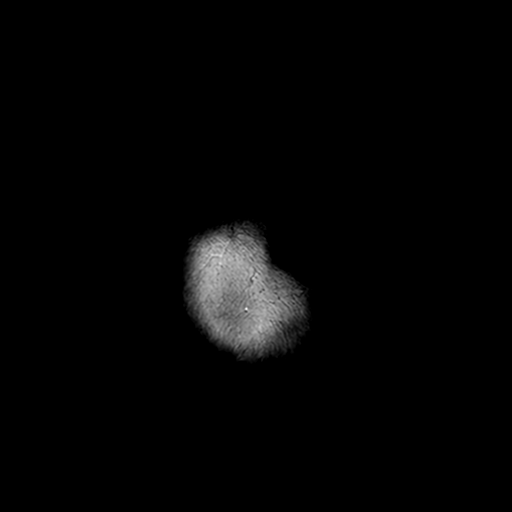

[Series 15: T1 post-contrast · coronal · 3.0mm · 0.43mm/px · 2 of 43 slices shown]
[im 1/43]
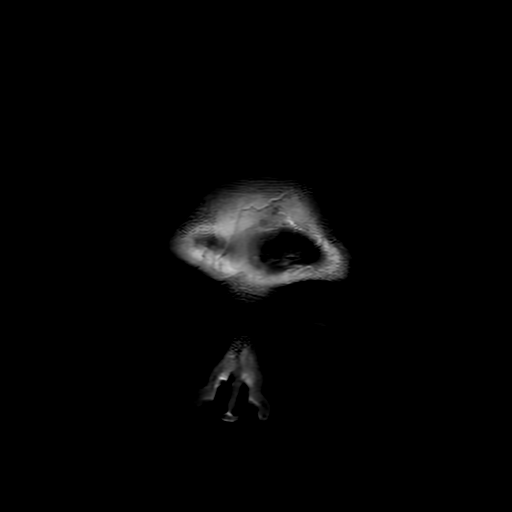
[im 43/43]
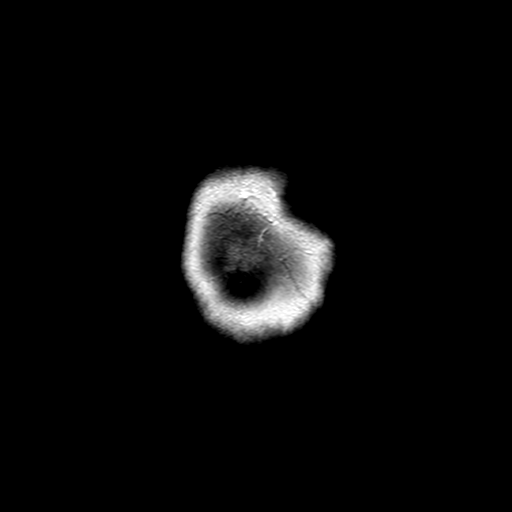

[Series 16: FLAIR post-contrast · sagittal · 3.0mm · 0.47mm/px · 2 of 37 slices shown]
[im 1/37]
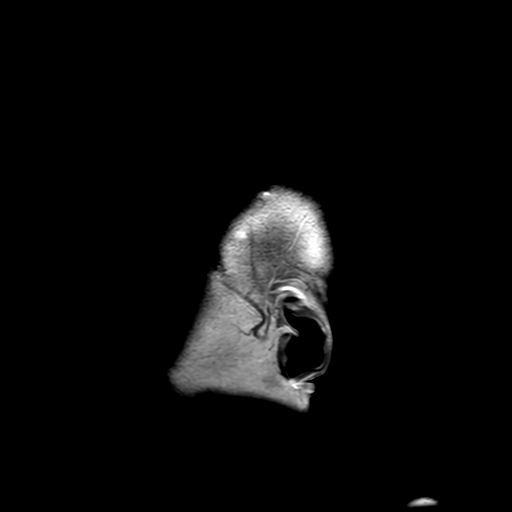
[im 37/37]
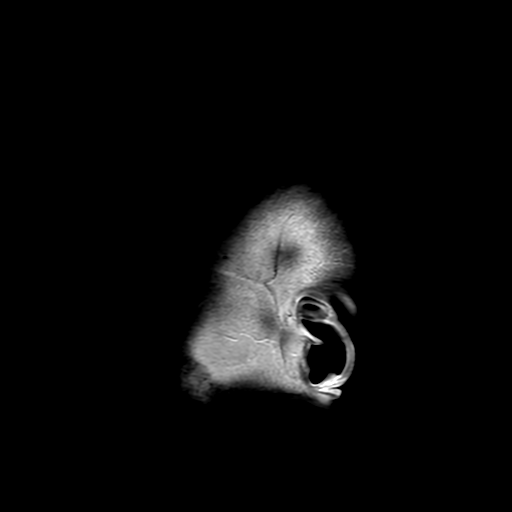

[Series 450: ADC · axial · 3.0mm · 0.94mm/px · z∈[-81,+67]mm · 3 of 51 slices shown]
[im 1/51]
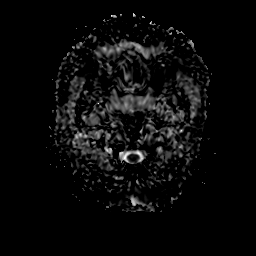
[im 26/51]
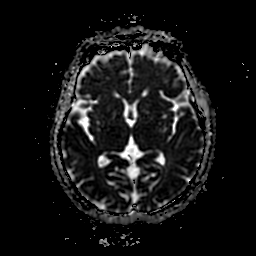
[im 51/51]
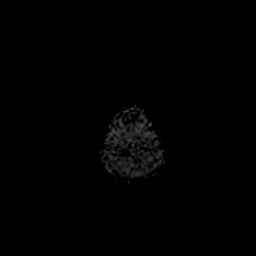

[25 of 48 positions shown; findings below may reference images not displayed]

FINDINGS: Brain: Status post left frontal craniotomy with subjacent dural
thickening and surgical cavity noted in the left frontal lobe and
surrounding vasogenic edema which appear unchanged. A surgical
cavity is filled with T1 hyperintense fluid, likely representing
blood products. New irregular contrast enhancement is seen
surrounding the surgical cavity which may be related to post
surgical ischemic changes. Continued follow-up is advised. The

Vascular: Normal flow voids.

Skull and upper cervical spine: Normal marrow signal.

Sinuses/Orbits: Negative.

Other: None.
IMPRESSION: Status post left frontal craniotomy with subjacent surgical cavity
in the left frontal lobe and surrounding vasogenic edema which
appear unchanged. New irregular contrast enhancement surrounding the
surgical cavity which may be related to post surgical ischemic
changes though continued follow-up is advised.

## 2019-07-19 MED ORDER — GADOBUTROL 1 MMOL/ML IV SOLN
5.0000 mL | Freq: Once | INTRAVENOUS | Status: AC | PRN
  Administered 2019-07-19: 5 mL via INTRAVENOUS

## 2019-07-20 ENCOUNTER — Encounter
Payer: Medicare Other | Attending: Physical Medicine and Rehabilitation | Admitting: Physical Medicine and Rehabilitation

## 2019-07-20 DIAGNOSIS — Z510 Encounter for antineoplastic radiation therapy: Secondary | ICD-10-CM | POA: Diagnosis not present

## 2019-07-21 ENCOUNTER — Telehealth: Payer: Self-pay

## 2019-07-21 ENCOUNTER — Ambulatory Visit
Admission: RE | Admit: 2019-07-21 | Discharge: 2019-07-21 | Disposition: A | Discharge status: Home / Self Care | Payer: Medicare Other | Source: Ambulatory Visit | Attending: Radiation Oncology | Admitting: Radiation Oncology

## 2019-07-21 ENCOUNTER — Other Ambulatory Visit: Payer: Self-pay

## 2019-07-21 DIAGNOSIS — C7931 Secondary malignant neoplasm of brain: Secondary | ICD-10-CM | POA: Insufficient documentation

## 2019-07-21 DIAGNOSIS — Z510 Encounter for antineoplastic radiation therapy: Secondary | ICD-10-CM | POA: Diagnosis present

## 2019-07-21 DIAGNOSIS — C342 Malignant neoplasm of middle lobe, bronchus or lung: Secondary | ICD-10-CM | POA: Insufficient documentation

## 2019-07-21 NOTE — Telephone Encounter (Signed)
HHPT/OT/ST referral accepted by [facility] [phone]) for their [facility] [phone]). SOC of care will be between Tuesday-Friday of next week. SW called pt son Day to inform on above and provided contact information for branch. SW encouraged follow-up if needed.

## 2019-07-21 NOTE — Progress Notes (Signed)
Patient ID: Sara Schroeder, female   DOB: 02-Nov-1950, 69 y.o.   MRN: [id number]   Name: Natacia Chaisson  MRN: [id number]  Date: 07/21/2019   DOB: Jul 30, 1950  Stereotactic Radiosurgery Operative Note  PRE-OPERATIVE DIAGNOSIS:  Solitary Brain Metastasis  POST-OPERATIVE DIAGNOSIS:  Same  PROCEDURE:  Stereotactic Radiosurgery  SURGEON:  Judith Part, MD  NARRATIVE: The patient underwent a radiation treatment planning session in the radiation oncology simulation suite under the care of the radiation oncology physician and physicist.  I participated closely in the radiation treatment planning afterwards. The patient underwent planning CT which was fused to 3T high resolution MRI with 1 mm axial slices.  These images were fused on the planning system.  We contoured the gross target volumes and subsequently expanded this to yield the Planning Target Volume. I actively participated in the planning process.  I helped to define and review the target contours and also the contours of the optic pathway, eyes, brainstem and selected nearby organs at risk.  All the dose constraints for critical structures were reviewed and compared to AAPM Task Group 101.  The prescription dose conformity was reviewed.  I approved the plan electronically.    Accordingly, Tyniya Kuyper was brought to the TrueBeam stereotactic radiation treatment linac and placed in the custom immobilization mask.  The patient was aligned according to the IR fiducial markers with BrainLab Exactrac, then orthogonal x-rays were used in ExacTrac with the 6DOF robotic table and the shifts were made to align the patient  Kallista Pae received stereotactic radiosurgery uneventfully.    Lesions treated:  1   Complex lesions treated:  0 (>3.5 cm, <80mm of optic path, or within the brainstem)   The detailed description of the procedure is recorded in the radiation oncology procedure note.  I was present for the duration of the procedure.  DISPOSITION:  Following  delivery, the patient was transported to nursing in stable condition and monitored for possible acute effects to be discharged to home in stable condition with follow-up in one month.  Judith Part, MD 07/21/2019 10:15 AM

## 2019-07-25 ENCOUNTER — Ambulatory Visit: Payer: Medicare Other | Admitting: Radiation Oncology

## 2019-07-25 ENCOUNTER — Inpatient Hospital Stay: Payer: Medicare Other | Attending: Internal Medicine | Admitting: Internal Medicine

## 2019-07-25 ENCOUNTER — Other Ambulatory Visit (HOSPITAL_COMMUNITY): Payer: Medicaid Other

## 2019-07-25 ENCOUNTER — Inpatient Hospital Stay: Payer: Medicare Other

## 2019-07-25 DIAGNOSIS — R569 Unspecified convulsions: Secondary | ICD-10-CM | POA: Insufficient documentation

## 2019-07-25 DIAGNOSIS — C342 Malignant neoplasm of middle lobe, bronchus or lung: Secondary | ICD-10-CM | POA: Insufficient documentation

## 2019-07-25 DIAGNOSIS — Z808 Family history of malignant neoplasm of other organs or systems: Secondary | ICD-10-CM | POA: Insufficient documentation

## 2019-07-25 DIAGNOSIS — Z79899 Other long term (current) drug therapy: Secondary | ICD-10-CM | POA: Insufficient documentation

## 2019-07-25 DIAGNOSIS — C7931 Secondary malignant neoplasm of brain: Secondary | ICD-10-CM | POA: Insufficient documentation

## 2019-07-25 DIAGNOSIS — Z923 Personal history of irradiation: Secondary | ICD-10-CM | POA: Insufficient documentation

## 2019-07-25 NOTE — Assessment & Plan Note (Deleted)
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  Patient currently awaiting SBRT solitary lung lesion; also SRS brain radiation.  Awaiting [facility] pathology.  #Discussed the high possibility of having novel mutation given ethnicity/non-smoking/gender; however I would await [facility] to make any therapeutic decisions.  #

## 2019-07-25 NOTE — Progress Notes (Deleted)
[facility] NOTE  Patient Care Team: Services, [place] as PCP - General Telford Nab, RN as Oncology Nurse Navigator  CHIEF COMPLAINTS/PURPOSE OF CONSULTATION:  ***  #  Oncology History   No history exists.     HISTORY OF PRESENTING ILLNESS:  Sara Schroeder 68 y.o.  female    ROS   MEDICAL HISTORY:  Past Medical History:  Diagnosis Date  . Anxiety   . Hypertension   . Lung cancer ([place])    Right Middle Lobe    SURGICAL HISTORY: Past Surgical History:  Procedure Laterality Date  . APPLICATION OF CRANIAL NAVIGATION N/A 06/29/2019   Procedure: APPLICATION OF CRANIAL NAVIGATION;  Surgeon: Judith Part, MD;  Location: [facility];  Service: Neurosurgery;  Laterality: N/A;  . CRANIOTOMY Left 06/29/2019   Procedure: LEFT CRANIOTOMY FOR TUMOR EXCISION;  Surgeon: Judith Part, MD;  Location: [facility];  Service: Neurosurgery;  Laterality: Left;    SOCIAL HISTORY: Social History   Socioeconomic History  . Marital status: Married    Spouse name: Not on file  . Number of children: Not on file  . Years of education: Not on file  . Highest education level: Not on file  Occupational History  . Not on file  Tobacco Use  . Smoking status: Never Smoker  . Smokeless tobacco: Never Used  Substance and Sexual Activity  . Alcohol use: Never  . Drug use: Never  . Sexual activity: Not Currently    Birth control/protection: Post-menopausal  Other Topics Concern  . Not on file  Social History Narrative  . Not on file   Social Determinants of Health   Financial Resource Strain:   . Difficulty of Paying Living Expenses:   Food Insecurity:   . Worried About Charity fundraiser in the Last Year:   . Arboriculturist in the Last Year:   Transportation Needs:   . Film/video editor (Medical):   Marland Kitchen Lack of Transportation (Non-Medical):   Physical Activity:   . Days of Exercise per Week:   . Minutes of Exercise per Session:   Stress:   . Feeling of  Stress :   Social Connections:   . Frequency of Communication with Friends and Family:   . Frequency of Social Gatherings with Friends and Family:   . Attends Religious Services:   . Active Member of Clubs or Organizations:   . Attends Archivist Meetings:   Marland Kitchen Marital Status:   Intimate Partner Violence:   . Fear of Current or Ex-Partner:   . Emotionally Abused:   Marland Kitchen Physically Abused:   . Sexually Abused:     FAMILY HISTORY: Family History  Problem Relation Age of Onset  . Cancer Son        some type of cancer involving the brain    ALLERGIES:  is allergic to lisinopril.  MEDICATIONS:  Current Outpatient Medications  Medication Sig Dispense Refill  . acetaminophen (TYLENOL) 325 MG tablet Take 2 tablets (650 mg total) by mouth every 4 (four) hours as needed for mild pain (temp > 101.5).    Marland Kitchen docusate sodium (COLACE) 100 MG capsule Take 1 capsule (100 mg total) by mouth 2 (two) times daily. (Patient not taking: Reported on 07/18/2019) 10 capsule 0  . levETIRAcetam (KEPPRA) 1000 MG tablet Take 1 tablet (1,000 mg total) by mouth 2 (two) times daily. 60 tablet 0  . LORazepam (ATIVAN) 0.5 MG tablet Take 1/2 tablet po 30 minutes  prior to brain radiation or brain MRI scans. Please write instructions in patients native language: Santiago Glad (if possible) 20 tablet 0  . Multiple Vitamin (MULTIVITAMIN WITH MINERALS) TABS tablet Take 1 tablet by mouth daily. 60 tablet 0  . omeprazole (PRILOSEC) 20 MG capsule Take 1 capsule (20 mg total) by mouth daily. 30 capsule 0  . pravastatin (PRAVACHOL) 20 MG tablet Take 1 tablet (20 mg total) by mouth every evening. 30 tablet 0  . traZODone (DESYREL) 50 MG tablet Take 2 tablets (100 mg total) by mouth at bedtime. 30 tablet 0   No current facility-administered medications for this visit.      Marland Kitchen  PHYSICAL EXAMINATION: ECOG PERFORMANCE STATUS: {CHL ONC ECOG PS:[phone]}  There were no vitals filed for this visit. There were no vitals filed  for this visit.  Physical Exam   LABORATORY DATA:  I have reviewed the data as listed Lab Results  Component Value Date   WBC 10.7 (H) 07/10/2019   HGB 11.9 (L) 07/10/2019   HCT 36.2 07/10/2019   MCV 94.0 07/10/2019   PLT 348 07/10/2019   Recent Labs    06/26/19 2002 06/27/19 0401 06/27/19 0928 06/28/19 0720 07/06/19 0702 07/07/19 0644 07/10/19 0611  NA 138   < > 139   < > 139 138 137  K 3.5   < > 4.0   < > 4.1 4.0 3.9  CL 103  --  109   < > 100 102 103  CO2 26  --  19*   < > 30 26 23   GLUCOSE 114*  --  147*   < > 123* 140* 127*  BUN 19  --  15   < > 20 24* 22  CREATININE 0.67  --  0.67   < > 0.71 0.75 0.68  CALCIUM 9.3  --  8.9   < > 9.2 9.5 9.5  GFRNONAA >60  --  >60   < > >60 >60 >60  GFRAA >60  --  >60   < > >60 >60 >60  PROT 7.9  --  6.1*  --   --   --  7.3  ALBUMIN 4.8  --  3.4*  --   --   --  3.5  AST 25  --  26  --   --   --  27  ALT 17  --  17  --   --   --  58*  ALKPHOS 82  --  60  --   --   --  70  BILITOT 1.2  --  1.2  --   --   --  0.7   < > = values in this interval not displayed.    RADIOGRAPHIC STUDIES: I have personally reviewed the radiological images as listed and agreed with the findings in the report. EEG  Result Date: 06/27/2019 Lora Havens, MD     06/27/2019  9:56 AM Patient Name: Sara Schroeder MRN: [id number] Epilepsy Attending: Lora Havens Referring Physician/Provider: Eliseo Gum, NP Date: 06/27/2019 Duration: 28.21 minutes Patient history: 69 year old female presented with seizure-like episode and was found to have left frontal lobe hemorrhagic mass.  EEG evaluate for seizures. Level of alertness: Comatose AEDs during EEG study: Keppra, propofol Technical aspects: This EEG study was done with scalp electrodes positioned according to the 10-20 International system of electrode placement. Electrical activity was acquired at a sampling rate of 500Hz  and reviewed with a high frequency filter of 70Hz  and a low  frequency filter of 1Hz . EEG data were  recorded continuously and digitally stored. Description: EEG showed an excessive amount of 15 to 18 Hz, sharply contoured beta activity with irregular morphology distributed symmetrically and diffusely.   Intermittent generalized background attenuation was also noted. Hyperventilation and photic stimulation were not performed. Abnormality -Excessive beta, generalized -Background attenuation, generalized IMPRESSION: This study is suggestive of severe to profound diffuse encephalopathy, nonspecific etiology but most likely secondary to sedation. No seizures or definite epileptiform discharges were seen throughout the recording. Priyanka [name] [name]   CT ABDOMEN PELVIS WO CONTRAST  Result Date: 06/27/2019 CLINICAL DATA:  Brain mass. Lung nodule. EXAM: CT CHEST, ABDOMEN AND PELVIS WITHOUT CONTRAST TECHNIQUE: Multidetector CT imaging of the chest, abdomen and pelvis was performed following the standard protocol without IV contrast. COMPARISON:  Chest radiograph yesterday. CT chest abdomen pelvis 03/01/2018 FINDINGS: CT CHEST FINDINGS Cardiovascular: Aortic atherosclerosis. No aortic aneurysm. Heart is normal in size. No pericardial effusion. Mediastinum/Nodes: Accurate assessment for hilar adenopathy is limited in the absence of IV contrast. There are no enlarged mediastinal lymph nodes. Endotracheal tube with tip above the carina. Enteric tube decompresses the esophagus. There is a 16 mm nodule that abuts the isthmus of the thyroid, likely unchanged from prior, on the previous exam portions of this region obscured by streak artifact from IV contrast. There is no axillary adenopathy. Lungs/Pleura: Spiculated right middle lobe pulmonary nodule measures 11 x 11 x 10 mm, only minimally increased in size from prior exam. Margins are irregular and spiculated with small spiculation extending to the pleural surface anteriorly, minimal pleural thickening. No other pulmonary nodule or mass. Dependent opacities in the lung bases  favor atelectasis. Trace pleural thickening without significant effusion. Musculoskeletal: No blastic or evidence of destructive lytic lesion. Ordinary degenerative change in the spine. No obvious breast mass. CT ABDOMEN PELVIS FINDINGS Hepatobiliary: Lack of IV contrast limits assessment for focal liver lesion. Capsular calcification involving the dome. No evidence of focal hepatic lesion. Gallbladder physiologically distended, no calcified stone. No biliary dilatation. Pancreas: No ductal dilatation or inflammation. No evidence of focal lesion on this noncontrast exam. Spleen: Normal in size without focal abnormality. Adrenals/Urinary Tract: Slight adrenal thickening without renal nodule. No hydronephrosis. Mild prominence of both renal collecting systems. No evidence of focal renal lesion on noncontrast exam. Urinary bladder is decompressed by Foley catheter. Stomach/Bowel: Enteric tube tip in the stomach. Contrast and fluid in the stomach without evidence of focal gastric mass. No small bowel wall thickening or inflammation. No obstruction, administered enteric contrast reaches the colon. Normal contrast filled appendix. Moderate stool distally. Vascular/Lymphatic: Aortic atherosclerosis. No aortic aneurysm. No enlarged lymph nodes in the abdomen or pelvis, detailed assessment limited in the absence of IV contrast. Small calcified lymph node adjacent to the proximal sigmoid colon, suggesting prior granulomatous disease. Reproductive: Uterus and bilateral adnexa are unremarkable. No evidence of adnexal mass. Other: No ascites or free fluid. No omental thickening. Musculoskeletal: No blastic or destructive lytic lesions. Transitional lumbosacral anatomy. Degenerative disc disease at L4-L5. There are no acute or suspicious osseous abnormalities. Calcified granuloma in the subcutaneous soft tissues. No suspicious skin or subcutaneous lesion. IMPRESSION: 1. Spiculated right middle lobe pulmonary nodule measuring 11 x  11 x 10 mm, only minimally increased in size from prior exam. This is suspicious for primary bronchogenic malignancy. Evaluation of the hila is limited in the absence of IV contrast, allowing for this limitation, no evidence of nodal metastasis. 2. No other evidence of metastatic disease or primary  malignancy in the chest, abdomen, or pelvis. 3. Exophytic 16 mm nodule from the thyroid isthmus, patient had previous thyroid uptake and scan at an outside institution that reported this nodule, however size was not specified, and those images are not available. No thyroid ultrasounds are available for review. Therefore, recommend thyroid US (ref: J Am Coll Radiol. 2015 Feb;12(2): 143-50), unless performed elsewhere. 4. Dependent opacities in the lung bases favor atelectasis. Aortic Atherosclerosis (ICD10-I70.0). Electronically Signed   By: Keith Rake M.D.   On: 06/27/2019 16:56   CT Head Wo Contrast  Result Date: 06/26/2019 CLINICAL DATA:  Seizure EXAM: CT HEAD WITHOUT CONTRAST TECHNIQUE: Contiguous axial images were obtained from the base of the skull through the vertex without intravenous contrast. COMPARISON:  None. FINDINGS: Brain: There is hemorrhage in the left frontal lobe measuring 2.7 x 2.0 x 2.8 cm with a large amount surrounding vasogenic edema. This is likely a mass. Brain parenchyma is otherwise normal. There is no herniation. 2 mm of rightward midline shift. Vascular: Negative Skull: Normal Sinuses/Orbits: Clear sinuses. Normal orbits. Other: None IMPRESSION: Left frontal lobe hemorrhagic mass with surrounding vasogenic edema and 2 mm of rightward midline shift. MRI with and without contrast is recommended for further characterization. Critical Value/emergent results were called by telephone at the time of interpretation on 06/26/2019 at 8:39 pm to provider [facility] , who verbally acknowledged these results. Electronically Signed   By: Ulyses Jarred M.D.   On: 06/26/2019 20:39   CT CHEST  WO CONTRAST  Result Date: 06/27/2019 CLINICAL DATA:  Brain mass. Lung nodule. EXAM: CT CHEST, ABDOMEN AND PELVIS WITHOUT CONTRAST TECHNIQUE: Multidetector CT imaging of the chest, abdomen and pelvis was performed following the standard protocol without IV contrast. COMPARISON:  Chest radiograph yesterday. CT chest abdomen pelvis 03/01/2018 FINDINGS: CT CHEST FINDINGS Cardiovascular: Aortic atherosclerosis. No aortic aneurysm. Heart is normal in size. No pericardial effusion. Mediastinum/Nodes: Accurate assessment for hilar adenopathy is limited in the absence of IV contrast. There are no enlarged mediastinal lymph nodes. Endotracheal tube with tip above the carina. Enteric tube decompresses the esophagus. There is a 16 mm nodule that abuts the isthmus of the thyroid, likely unchanged from prior, on the previous exam portions of this region obscured by streak artifact from IV contrast. There is no axillary adenopathy. Lungs/Pleura: Spiculated right middle lobe pulmonary nodule measures 11 x 11 x 10 mm, only minimally increased in size from prior exam. Margins are irregular and spiculated with small spiculation extending to the pleural surface anteriorly, minimal pleural thickening. No other pulmonary nodule or mass. Dependent opacities in the lung bases favor atelectasis. Trace pleural thickening without significant effusion. Musculoskeletal: No blastic or evidence of destructive lytic lesion. Ordinary degenerative change in the spine. No obvious breast mass. CT ABDOMEN PELVIS FINDINGS Hepatobiliary: Lack of IV contrast limits assessment for focal liver lesion. Capsular calcification involving the dome. No evidence of focal hepatic lesion. Gallbladder physiologically distended, no calcified stone. No biliary dilatation. Pancreas: No ductal dilatation or inflammation. No evidence of focal lesion on this noncontrast exam. Spleen: Normal in size without focal abnormality. Adrenals/Urinary Tract: Slight adrenal  thickening without renal nodule. No hydronephrosis. Mild prominence of both renal collecting systems. No evidence of focal renal lesion on noncontrast exam. Urinary bladder is decompressed by Foley catheter. Stomach/Bowel: Enteric tube tip in the stomach. Contrast and fluid in the stomach without evidence of focal gastric mass. No small bowel wall thickening or inflammation. No obstruction, administered enteric contrast reaches the colon. Normal  contrast filled appendix. Moderate stool distally. Vascular/Lymphatic: Aortic atherosclerosis. No aortic aneurysm. No enlarged lymph nodes in the abdomen or pelvis, detailed assessment limited in the absence of IV contrast. Small calcified lymph node adjacent to the proximal sigmoid colon, suggesting prior granulomatous disease. Reproductive: Uterus and bilateral adnexa are unremarkable. No evidence of adnexal mass. Other: No ascites or free fluid. No omental thickening. Musculoskeletal: No blastic or destructive lytic lesions. Transitional lumbosacral anatomy. Degenerative disc disease at L4-L5. There are no acute or suspicious osseous abnormalities. Calcified granuloma in the subcutaneous soft tissues. No suspicious skin or subcutaneous lesion. IMPRESSION: 1. Spiculated right middle lobe pulmonary nodule measuring 11 x 11 x 10 mm, only minimally increased in size from prior exam. This is suspicious for primary bronchogenic malignancy. Evaluation of the hila is limited in the absence of IV contrast, allowing for this limitation, no evidence of nodal metastasis. 2. No other evidence of metastatic disease or primary malignancy in the chest, abdomen, or pelvis. 3. Exophytic 16 mm nodule from the thyroid isthmus, patient had previous thyroid uptake and scan at an outside institution that reported this nodule, however size was not specified, and those images are not available. No thyroid ultrasounds are available for review. Therefore, recommend thyroid US (ref: J Am Coll  Radiol. 2015 Feb;12(2): 143-50), unless performed elsewhere. 4. Dependent opacities in the lung bases favor atelectasis. Aortic Atherosclerosis (ICD10-I70.0). Electronically Signed   By: Keith Rake M.D.   On: 06/27/2019 16:56   MR Brain W Wo Contrast  Result Date: 07/19/2019 CLINICAL DATA:  CNS neoplasm status post treatment. EXAM: MRI HEAD WITHOUT AND WITH CONTRAST TECHNIQUE: Multiplanar, multiecho pulse sequences of the brain and surrounding structures were obtained without and with intravenous contrast. CONTRAST:  36mL GADAVIST GADOBUTROL 1 MMOL/ML IV SOLN COMPARISON:  MRI of the brain Jun 30, 2019 FINDINGS: Brain: Status post left frontal craniotomy with subjacent dural thickening and surgical cavity noted in the left frontal lobe and surrounding vasogenic edema which appear unchanged. A surgical cavity is filled with T1 hyperintense fluid, likely representing blood products. New irregular contrast enhancement is seen surrounding the surgical cavity which may be related to post surgical ischemic changes. Continued follow-up is advised. The Vascular: Normal flow voids. Skull and upper cervical spine: Normal marrow signal. Sinuses/Orbits: Negative. Other: None. IMPRESSION: Status post left frontal craniotomy with subjacent surgical cavity in the left frontal lobe and surrounding vasogenic edema which appear unchanged. New irregular contrast enhancement surrounding the surgical cavity which may be related to post surgical ischemic changes though continued follow-up is advised. Electronically Signed   By: Pedro Earls M.D.   On: 07/19/2019 16:12   MR BRAIN W WO CONTRAST  Result Date: 06/30/2019 CLINICAL DATA:  Post mass resection EXAM: MRI HEAD WITHOUT AND WITH CONTRAST TECHNIQUE: Multiplanar, multiecho pulse sequences of the brain and surrounding structures were obtained without and with intravenous contrast. CONTRAST:  55mL GADAVIST GADOBUTROL 1 MMOL/ML IV SOLN COMPARISON:  06/27/2019  FINDINGS: Brain: Post resection of left mass with thin extra-axial collection and resection cavity containing fluid and blood products. There is reduced diffusion at the deep margin likely reflecting postoperative contusion. There is no residual nodular enhancement. Associated edema and regional mass effect are similar. Vascular: Major vessel flow voids at the skull base are preserved. Skull and upper cervical spine: Post left frontal craniotomy. Normal marrow signal is preserved. Sinuses/Orbits: Paranasal sinuses are aerated. Orbits are unremarkable. Other: Sella is unremarkable.  Mastoid air cells are clear. IMPRESSION: Expected postoperative  changes of gross total resection of left frontal mass. Surrounding edema and mild mass effect are similar. Electronically Signed   By: Macy Mis M.D.   On: 06/30/2019 08:08   MR BRAIN W WO CONTRAST  Result Date: 06/27/2019 CLINICAL DATA:  Brain mass.  History of lung nodule. EXAM: MRI HEAD WITHOUT AND WITH CONTRAST TECHNIQUE: Multiplanar, multiecho pulse sequences of the brain and surrounding structures were obtained without and with intravenous contrast. CONTRAST:  5.74mL GADAVIST GADOBUTROL 1 MMOL/ML IV SOLN COMPARISON:  CT head 06/26/2019. MRI head without with contrast 06/27/2019 FINDINGS: Brain: SRS protocol with thin sections performed on 3 tesla MRI. Solitary mass left frontal lobe with evidence of internal hemorrhage. The mass measures 2.2 x 2.3 x 2.8 cm. There is extensive vasogenic edema in the white matter. This is causing mass-effect and mild midline shift to the right of approximately 2 mm. Ventricle size normal. No second lesion identified. Mild chronic microvascular ischemic change in the white matter. Negative for acute infarct. Vascular: Normal arterial flow voids Skull and upper cervical spine: No focal skeletal lesion. Sinuses/Orbits: Mild mucosal edema paranasal sinuses. Bilateral orbits Other: None IMPRESSION: Solitary hemorrhagic mass left frontal  lobe measuring 2.2 x 2.3 x 2.8 cm. Extensive vasogenic edema. 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 Primary brain tumor not likely. Electronically Signed   By: Franchot Gallo M.D.   On: 06/27/2019 17:43   MR BRAIN W WO CONTRAST  Result Date: 06/27/2019 CLINICAL DATA:  Mass follow-up EXAM: MRI HEAD WITHOUT AND WITH CONTRAST TECHNIQUE: Multiplanar, multiecho pulse sequences of the brain and surrounding structures were obtained without and with intravenous contrast. CONTRAST:  56mL GADAVIST GADOBUTROL 1 MMOL/ML IV SOLN COMPARISON:  Head CT from yesterday FINDINGS: Brain: 2.6 cm left lateral frontal mass which appears intra-axial. The mass is T2 hypointense and mildly dense by CT. There is patchy T1 shortening compatible with blood products based on gradient imaging. There is prominent adjacent vasogenic edema. The mass is diffusely enhancing and not a simple hematoma. No second mass is seen.  No infarct, hydrocephalus, or collection. Vascular: Normal flow voids and vascular enhancements Skull and upper cervical spine: Normal marrow signal Sinuses/Orbits: Negative IMPRESSION: 1. 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 There was a worrisome pulmonary nodule on a January 2020 chest CT, updated chest CT may be contributory. 2. Extensive vasogenic edema. Electronically Signed   By: Monte Fantasia M.D.   On: 06/27/2019 08:12   DG CHEST PORT 1 VIEW  Result Date: 07/05/2019 CLINICAL DATA:  [place]. Additional provided: Patient recently presented with seizure was found to have a large hemorrhagic left frontal lobe mass. EXAM: PORTABLE CHEST 1 VIEW COMPARISON:  Chest radiograph 06/28/2019, chest CT 06/27/2019 FINDINGS: Heart size within normal limits. Mild ill-defined opacity at the right lung base. Redemonstrated right middle lobe lung nodule better characterized on chest CT 06/27/2019. The left lung is clear. No evidence of pleural effusion or pneumothorax. No acute  bony abnormality identified. IMPRESSION: Mild ill-defined opacity at the right lung base which may reflect atelectasis or pneumonia. Redemonstrated right middle lobe pulmonary nodule better characterized on prior chest CT 06/27/2019. Electronically Signed   By: Kellie Simmering DO   On: 07/05/2019 11:06   DG CHEST PORT 1 VIEW  Result Date: 06/28/2019 CLINICAL DATA:  Acute hypoxemic respiratory failure EXAM: PORTABLE CHEST 1 VIEW COMPARISON:  CT 06/27/2019 FINDINGS: *Endotracheal tube in the mid trachea 3 cm from the carina. *Transesophageal tube tip and side  port distal to the GE junction curling in the left upper quadrant. *Telemetry leads and support devices overlie the chest. Redemonstration of a right middle lobe pulmonary nodule better seen on comparison CT. Mild basilar atelectatic changes. No acute consolidative opacity or convincing features of edema. No pneumothorax or effusion. Cardiomediastinal contours are stable. No acute osseous or soft tissue abnormality. Degenerative changes are present in the imaged spine and shoulders. IMPRESSION: 1. Mild basilar atelectatic changes. 2. Redemonstration of right middle lobe pulmonary nodule better seen on comparison CT. 3. Lines and tubes as above. Electronically Signed   By: Lovena Le M.D.   On: 06/28/2019 06:27   DG Chest Port 1 View  Result Date: 06/27/2019 CLINICAL DATA:  Intubation. EXAM: PORTABLE CHEST 1 VIEW COMPARISON:  Chest x-ray 06/26/2019, 03/01/2018. FINDINGS: Endotracheal tube, NG tube in good anatomic position. NG tube has been advanced its tip and side hole in the stomach. Heart size normal. As previously noted on prior study of 06/26/2018 right lower lobe nodule appears larger. Again further evaluation with chest CT is suggested. Mild left base subsegmental atelectasis. No pleural effusion or pneumothorax. IMPRESSION: 1. Lines and tubes in good anatomic position. NG tube has been advanced its tip and side hole are within the stomach. 2. As  noted on prior study of 06/26/2018 right lower lobe pulmonary nodule appears larger. Again further evaluation with chest CT suggested. These results will be called to the ordering clinician or representative by the Radiologist Assistant, and communication documented in the PACS or [employer]. Electronically Signed   By: Marcello Moores  Register   On: 06/27/2019 08:42   DG Chest Portable 1 View  Result Date: 06/26/2019 CLINICAL DATA:  69 year old female status post intubation. EXAM: PORTABLE CHEST 1 VIEW COMPARISON:  Earlier radiograph dated 06/26/2019. FINDINGS: Endotracheal tube with tip approximately 18 mm above the carina. Recommend retraction by 2-3 cm for optimal positioning. Enteric tube with side port above the GE junction. Recommend further advancing by additional 8 cm. No other interval change. IMPRESSION: 1. Endotracheal tube above the carina. 2. Enteric tube with side port above the GE junction. Recommend further advancing by additional 8 cm. Electronically Signed   By: Anner Crete M.D.   On: 06/26/2019 21:27   DG Chest Portable 1 View  Result Date: 06/26/2019 CLINICAL DATA:  Fever EXAM: PORTABLE CHEST 1 VIEW COMPARISON:  03/01/2018 FINDINGS: The heart size and mediastinal contours are within normal limits. Right lower lung nodule may be slightly increased, now measuring 14 mm. The visualized skeletal structures are unremarkable. IMPRESSION: No active disease. Suspected slight increase in size of right lower lung nodule. Follow-up chest CT is recommended. Electronically Signed   By: Donavan Foil M.D.   On: 06/26/2019 20:48   EEG adult  Result Date: 06/30/2019 Lora Havens, MD     07/01/2019  8:50 AM Patient Name: Hazle Ogburn MRN: [id number] Epilepsy Attending: Lora Havens Referring Physician/Provider:  Dr. 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   EEG evaluate for seizures.  Level of alertness:  Awake  AEDs during EEG study: Keppra  Technical aspects: This EEG study was done with scalp electrodes positioned according to the 10-20 International system of electrode placement. Electrical activity was acquired at a sampling rate of 500Hz  and reviewed with a high frequency filter of 70Hz  and a low frequency filter of 1Hz . EEG data were recorded continuously and  digitally stored.  Description:  No clear posterior dominant rhythm was seen.  EEG showed continuous generalized polymorphic 5 to 9 Hz theta-alpha activity.  Hyperventilation and photic stimulation were not performed. Of note, EEG was technically difficult due to significant eye flutter artifact.  Abnormality -Continuous slow, generalized  IMPRESSION: This technically difficult study suggestive of moderate diffuse encephalopathy, nonspecific etiology. No seizures or definite epileptiform discharges were seen throughout the recording. Priyanka [name] [name]   Overnight EEG with video  Result Date: 07/01/2019 Lora Havens, MD     07/02/2019  9:21 AM Patient Name:Ryla Trnka JOI:[id number] Epilepsy Attending:Priyanka [name] [name] Referring Physician/Provider: Dr. Emelda Brothers Duration:06/30/2019 [phone] to 07/01/2019 1223  Patient history:69 year old female presented with seizure-like episode and was found to have left frontal lobe hemorrhagic mass. EEG evaluate for seizures.  Level of alertness: awake, asleep  AEDs during EEG study:Keppra  Technical aspects: This EEG study was done with scalp electrodes positioned according to the 10-20 International system of electrode placement. Electrical activity was acquired at a sampling rate of 500Hz  and reviewed with a high frequency filter of 70Hz  and a low frequency filter of 1Hz . EEG data were recorded continuously and digitally stored.  Description: No clear posterior dominant rhythm was seen. Sleep was characterized by vertex waves, sleep spindles  (12-14hz ), maximal frontocentral region. EEG showed continuous generalized and lateralized left frontotemporal region  3-6 Hz theta-delta slowing.  Hyperventilation and photic stimulation were not performed.  Abnormality -Continuous slow, generalized and lateralized left frontotemporal region  IMPRESSION: This study suggestive of cortical dysfunction in left frontotemporal region consistent with underlying structural abnormality. Additionally, there is evidence of moderate diffuse encephalopathy, nonspecific etiology.No seizures ordefiniteepileptiform discharges were seen throughout the recording.  Lora Havens   US THYROID  Result Date: 06/28/2019 CLINICAL DATA:  Incidental on CT. Nodule of thyroid isthmus by CT of the chest. EXAM: THYROID ULTRASOUND TECHNIQUE: Ultrasound examination of the thyroid gland and adjacent soft tissues was performed. COMPARISON:  CT of the chest on 06/27/2019 FINDINGS: Parenchymal Echotexture: Mildly heterogenous Isthmus: 1.0 cm Right lobe: 4.4 x 1.7 x 1.2 cm Left lobe: 4.3 x 1.9 x 1.3 cm _________________________________________________________ Estimated total number of nodules >/= 1 cm: 1 Number of spongiform nodules >/=  2 cm not described below (TR1): 0 Number of mixed cystic and solid nodules >/= 1.5 cm not described below (TR2): 0 _________________________________________________________ Nodule # 1: Location: Isthmus Maximum size: 1.6 cm; Other 2 dimensions: 1.4 x 1.1 cm Composition: solid/almost completely solid (2) Echogenicity: hypoechoic (2) Shape: not taller-than-wide (0) Margins: ill-defined (0) Echogenic foci: none (0) ACR TI-RADS total points: 4. ACR TI-RADS risk category: TR4 (4-6 points). ACR TI-RADS recommendations: **Given size (>/= 1.5 cm) and appearance, fine needle aspiration of this moderately suspicious nodule should be considered based on TI-RADS criteria. _________________________________________________________ Simple cyst in the inferior left lobe  measures 0.9 cm in greatest diameter and has a benign appearance. No abnormal lymph nodes identified IMPRESSION: 1.6 cm isthmus nodule technically meets criteria for fine-needle aspiration. The nodule is borderline in size for biopsy and 1 year follow-up ultrasound could also be considered. The above is in keeping with the ACR TI-RADS recommendations - J Am Coll Radiol 2017;14:587-595. Electronically Signed   By: Aletta Edouard M.D.   On: 06/28/2019 15:41    ASSESSMENT & PLAN:   No problem-specific Assessment & Plan notes found for this encounter.    All questions were answered. The patient knows to call the clinic with any problems, questions or concerns.  Cammie Sickle, MD 07/25/2019 8:27 AM

## 2019-07-26 ENCOUNTER — Encounter (HOSPITAL_COMMUNITY): Payer: Self-pay | Admitting: Hematology & Oncology

## 2019-07-26 ENCOUNTER — Ambulatory Visit: Payer: Medicare Other | Admitting: Radiation Oncology

## 2019-07-26 ENCOUNTER — Other Ambulatory Visit: Payer: Self-pay

## 2019-07-26 ENCOUNTER — Encounter: Payer: Self-pay | Admitting: *Deleted

## 2019-07-26 ENCOUNTER — Ambulatory Visit
Admission: RE | Admit: 2019-07-26 | Discharge: 2019-07-26 | Disposition: A | Discharge status: Home / Self Care | Payer: Medicare Other | Source: Ambulatory Visit | Attending: Radiation Oncology | Admitting: Radiation Oncology

## 2019-07-26 DIAGNOSIS — C7931 Secondary malignant neoplasm of brain: Secondary | ICD-10-CM | POA: Diagnosis present

## 2019-07-26 DIAGNOSIS — C3401 Malignant neoplasm of right main bronchus: Secondary | ICD-10-CM | POA: Insufficient documentation

## 2019-07-26 DIAGNOSIS — Z510 Encounter for antineoplastic radiation therapy: Secondary | ICD-10-CM | POA: Diagnosis not present

## 2019-07-26 DIAGNOSIS — C342 Malignant neoplasm of middle lobe, bronchus or lung: Secondary | ICD-10-CM

## 2019-07-26 NOTE — Progress Notes (Signed)
Called patient's son, Day, to give him appointment information for follow up with Dr Marin Olp, who saw patient while she was in the hospital.   Day stated that patient needed to have all her care at [facility] and that our location was too far. Will send referral to [facility] location and they can follow up with patient for Medical Oncology care. Will also reach out to navigator at [facility].

## 2019-07-26 NOTE — Progress Notes (Signed)
Patient rested with [place] for 15 minutes following her SRS treatment.  Patient denies headache, dizziness, nausea, diplopia or ringing in the ears. Denies fatigue. Patient without complaints. Understands to avoid strenuous activity for the next 24 hours and call [phone] with needs.   BP (!) 154/86   Pulse (!) 119   Temp (!) 97.5 F (36.4 C)   Resp 16   SpO2 99%    Honesty Menta M. Leonie Green, BSN

## 2019-07-27 ENCOUNTER — Encounter: Payer: Self-pay | Admitting: *Deleted

## 2019-07-27 NOTE — Progress Notes (Signed)
  Oncology Nurse Navigator Documentation  Navigator Location: [facility]-Med Onc (07/27/19 1100)   )Navigator Encounter Type: Introductory Phone Call (07/27/19 1100)                   Treatment Initiated Date: 07/21/19 (07/27/19 1100)     Barriers/Navigation Needs: Anxiety;Coordination of Care (07/27/19 1100)   Interventions: Coordination of Care (07/27/19 1100)   Coordination of Care: Appts (07/27/19 1100)     phone call made to pt's son, Day, to introduce to navigator services and discuss rescheduling new patient appt with Dr. Rogue Bussing. Day stated that his mom is very weak and anxious at this time and does not think she can come in for an appt while receiving radiation treatments. Phone number given to Day so he can give me a call back when they are ready to reschedule her appt with Dr. B. Had lengthy conversation with Day regarding his mom and different treatment options that will potentially be discussed. Reassurance provided. All questions answered during phone call. Nothing further needed at this time.              Time Spent with Patient: 45 (07/27/19 1100)

## 2019-07-28 ENCOUNTER — Other Ambulatory Visit: Payer: Self-pay

## 2019-07-28 ENCOUNTER — Ambulatory Visit
Admission: RE | Admit: 2019-07-28 | Discharge: 2019-07-28 | Disposition: A | Discharge status: Home / Self Care | Payer: Medicare Other | Source: Ambulatory Visit | Attending: Radiation Oncology | Admitting: Radiation Oncology

## 2019-07-28 DIAGNOSIS — Z510 Encounter for antineoplastic radiation therapy: Secondary | ICD-10-CM | POA: Diagnosis not present

## 2019-07-28 NOTE — Progress Notes (Signed)
Sara Schroeder rested with [place] for 15 minutes following her [place] treatment.  Patient denies headache, dizziness, nausea, diplopia or ringing in the ears. Denies fatigue. Patient without complaints. Understands to avoid strenuous activity for the next 24 hours and call [phone] with needs.   BP (!) 146/67   Pulse (!) 110   Temp 97.6 F (36.4 C)   Resp 18   SpO2 99%    Sherrise Liberto M. Leonie Green, BSN

## 2019-08-01 ENCOUNTER — Ambulatory Visit: Payer: Medicare Other | Admitting: Radiation Oncology

## 2019-08-01 ENCOUNTER — Encounter: Payer: Self-pay | Admitting: Radiation Therapy

## 2019-08-01 NOTE — Progress Notes (Signed)
I spoke with Sara Schroeder at [facility] about getting Sara Schroeder set up for Med Onc there. She had the patient scheduled, but the patient's  son, Sara Schroeder, cancelled that visit stating he wants his mother to have some time after completing radiation to regain strength before rescheduling this visit. Sara Schroeder's last radiation treatment is scheduled for 6/9. I have asked Melissa to call back and check in with Sara Schroeder and Sara Schroeder the end of next week to try and reschedule at that time.   Mont Dutton R.T.(R)(T) Radiation Special Procedures Navigator

## 2019-08-02 ENCOUNTER — Ambulatory Visit
Admission: RE | Admit: 2019-08-02 | Discharge: 2019-08-02 | Disposition: A | Discharge status: Home / Self Care | Payer: Medicare Other | Source: Ambulatory Visit | Attending: Radiation Oncology | Admitting: Radiation Oncology

## 2019-08-02 ENCOUNTER — Encounter: Payer: Self-pay | Admitting: Radiation Oncology

## 2019-08-02 ENCOUNTER — Other Ambulatory Visit: Payer: Self-pay

## 2019-08-02 DIAGNOSIS — Z510 Encounter for antineoplastic radiation therapy: Secondary | ICD-10-CM | POA: Diagnosis not present

## 2019-08-11 ENCOUNTER — Telehealth: Payer: Self-pay | Admitting: Pharmacist

## 2019-08-11 ENCOUNTER — Other Ambulatory Visit: Payer: Self-pay

## 2019-08-11 ENCOUNTER — Telehealth: Payer: Self-pay | Admitting: Pharmacy Technician

## 2019-08-11 ENCOUNTER — Inpatient Hospital Stay: Payer: Medicare Other

## 2019-08-11 ENCOUNTER — Other Ambulatory Visit: Payer: Medicaid Other

## 2019-08-11 ENCOUNTER — Encounter (HOSPITAL_COMMUNITY): Payer: Self-pay | Admitting: Internal Medicine

## 2019-08-11 ENCOUNTER — Ambulatory Visit: Payer: Medicaid Other | Admitting: Hematology & Oncology

## 2019-08-11 ENCOUNTER — Inpatient Hospital Stay (HOSPITAL_BASED_OUTPATIENT_CLINIC_OR_DEPARTMENT_OTHER): Payer: Medicare Other | Admitting: Internal Medicine

## 2019-08-11 ENCOUNTER — Encounter: Payer: Self-pay | Admitting: Internal Medicine

## 2019-08-11 ENCOUNTER — Encounter: Payer: Self-pay | Admitting: *Deleted

## 2019-08-11 DIAGNOSIS — C342 Malignant neoplasm of middle lobe, bronchus or lung: Secondary | ICD-10-CM

## 2019-08-11 DIAGNOSIS — Z808 Family history of malignant neoplasm of other organs or systems: Secondary | ICD-10-CM | POA: Diagnosis not present

## 2019-08-11 DIAGNOSIS — Z923 Personal history of irradiation: Secondary | ICD-10-CM | POA: Diagnosis not present

## 2019-08-11 DIAGNOSIS — C7931 Secondary malignant neoplasm of brain: Secondary | ICD-10-CM

## 2019-08-11 DIAGNOSIS — R569 Unspecified convulsions: Secondary | ICD-10-CM

## 2019-08-11 DIAGNOSIS — Z79899 Other long term (current) drug therapy: Secondary | ICD-10-CM | POA: Diagnosis not present

## 2019-08-11 LAB — COMPREHENSIVE METABOLIC PANEL
ALT: 15 U/L (ref 0–44)
AST: 18 U/L (ref 15–41)
Albumin: 4.5 g/dL (ref 3.5–5.0)
Alkaline Phosphatase: 68 U/L (ref 38–126)
Anion gap: 11 (ref 5–15)
BUN: 10 mg/dL (ref 8–23)
CO2: 26 mmol/L (ref 22–32)
Calcium: 9.1 mg/dL (ref 8.9–10.3)
Chloride: 105 mmol/L (ref 98–111)
Creatinine, Ser: 0.59 mg/dL (ref 0.44–1.00)
GFR calc Af Amer: >60 mL/min (ref >60)
GFR calc non Af Amer: >60 mL/min (ref >60)
Glucose, Bld: 112 mg/dL — ABNORMAL HIGH (ref 70–99)
Potassium: 3.6 mmol/L (ref 3.5–5.1)
Sodium: 142 mmol/L (ref 135–145)
Total Bilirubin: 1 mg/dL (ref 0.3–1.2)
Total Protein: 7.5 g/dL (ref 6.5–8.1)

## 2019-08-11 LAB — CBC WITH DIFFERENTIAL/PLATELET
Abs Immature Granulocytes: 0.01 10*3/uL (ref 0.00–0.07)
Basophils Absolute: 0 10*3/uL (ref 0.0–0.1)
Basophils Relative: 1 %
Eosinophils Absolute: 0.1 10*3/uL (ref 0.0–0.5)
Eosinophils Relative: 2 %
HCT: 35.8 % — ABNORMAL LOW (ref 36.0–46.0)
Hemoglobin: 11.6 g/dL — ABNORMAL LOW (ref 12.0–15.0)
Immature Granulocytes: 0 %
Lymphocytes Relative: 29 %
Lymphs Abs: 1.5 10*3/uL (ref 0.7–4.0)
MCH: 30.3 pg (ref 26.0–34.0)
MCHC: 32.4 g/dL (ref 30.0–36.0)
MCV: 93.5 fL (ref 80.0–100.0)
Monocytes Absolute: 0.3 10*3/uL (ref 0.1–1.0)
Monocytes Relative: 6 %
Neutro Abs: 3.2 10*3/uL (ref 1.7–7.7)
Neutrophils Relative %: 62 %
Platelets: 207 10*3/uL (ref 150–400)
RBC: 3.83 MIL/uL — ABNORMAL LOW (ref 3.87–5.11)
RDW: 14.2 % (ref 11.5–15.5)
WBC: 5.1 10*3/uL (ref 4.0–10.5)
nRBC: 0 % (ref 0.0–0.2)

## 2019-08-11 MED ORDER — OSIMERTINIB MESYLATE 40 MG PO TABS: 80.0000 mg | ORAL_TABLET | Freq: Every day | ORAL | 3 refills | Status: DC

## 2019-08-11 NOTE — Telephone Encounter (Signed)
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  Prescription dose and frequency assessed.   Current medication list in Epic reviewed, no DDIs with osimertinib identified.  Prescription has been e-scribed to the [facility] for benefits analysis and approval.  Oral Oncology Clinic will continue to follow for insurance authorization, copayment issues, initial counseling and start date.  Darl Pikes, PharmD, BCPS, BCOP, CPP Hematology/Oncology Clinical Pharmacist Practitioner [facility]/HP/AP [facility] [phone]  08/11/2019 10:12 AM

## 2019-08-11 NOTE — Assessment & Plan Note (Addendum)
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  Patient currently s/p SBRT solitary lung lesion; also SRS brain radiation.  NGS- positive for EGFR mutation.  #Recommend patient starting on osimertinib; start initially 40 mg a day x2 weeks and then if tolerating well proceed to 80 mg a day.   #Long discussion the patient/son-regarding overall poor prognosis; incurable nature of the disease.  However, discussed that median PFS in the range of 2 to 3 years.  Discussed the potential side effects including but not limited to diarrhea skin rash EKG abnormalities.  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  Currently on Keppra.  Thank you, for allowing me to participate in the care of your pleasant patient. Please do not hesitate to contact me with questions or concerns in the interim.  # I reviewed the blood work- with the patient in detail; also reviewed the imaging independently [as summarized above]; and with the patient in detail.   # DISPOSITION: # labs today- cbc/cmp # follow up in 3 weeks- MD: labs- cbc/cmp-Dr.B

## 2019-08-11 NOTE — Telephone Encounter (Addendum)
Oral Oncology Patient Advocate Encounter  After completing a benefits investigation, prior authorization for Tagrisso is not required at this time through [place].  Patient's copay is $3.00.  Spoke to patients son at appointment.  He states that she lives with his other brother, Day, and he speaks [place] and we can speak with him to set up delivery of medication.  [place] [facility] Phone [phone] Fax [phone] 08/11/2019 10:45 AM

## 2019-08-11 NOTE — Progress Notes (Signed)
  Oncology Nurse Navigator Documentation  Navigator Location: [facility]-Med Onc (08/11/19 1000)   )Navigator Encounter Type: Clinic/MDC (08/11/19 1000)               Multidisiplinary Clinic Date: 08/11/19 (08/11/19 1000) Multidisiplinary Clinic Type: Thoracic (08/11/19 1000)   Patient Visit Type: MedOnc (08/11/19 1000) Treatment Phase: Pre-Tx/Tx Discussion (08/11/19 1000) Barriers/Navigation Needs: Coordination of Care;Language/Communication;Anxiety (08/11/19 1000) Education: Understanding Cancer/ Treatment Options;Newly Diagnosed Cancer Education (08/11/19 1000) Interventions: Coordination of Care;Education (08/11/19 1000)   Coordination of Care: Appts (08/11/19 1000) Education Method: Written (08/11/19 1000)      Acuity: Level 3-Moderate Needs (3-4 Barriers Identified) (08/11/19 1000)      met with patient and her son, Laverna Peace, during new patient visit with Dr. Rogue Bussing to discuss systemic therapy. All questions answered during visit. Pt's son given resources regarding diagnosis and supportive services available. Reviewed upcoming appts. Contact info given and instructed to call with any further questions or needs. Pt's son verbalized understanding.   Time Spent with Patient: 60 (08/11/19 1000)

## 2019-08-11 NOTE — Progress Notes (Signed)
[facility] NOTE  Patient Care Team: Services, [place] as PCP - General Telford Nab, [place] as Oncology Nurse Navigator  CHIEF COMPLAINTS/PURPOSE OF CONSULTATION: lung nodule/mass  #  Oncology History Overview Note  # MAY 2021- [[place]; Dr.Ennver/Mohmad]- MRI of the brain-2.6 cm left lateral frontal mass compatible with hemorrhagic neoplasm.  A CT of the chest, abdomen, pelvis without contrast was performed on 06/27/2019 which showed a spiculated right middle lobe pulmonary nodule measuring 11 x 11 x 10 mm.  This had been present on a CT scan performed on 03/01/2018.  There was no evidence of metastatic disease or primary malignancy in the chest, abdomen, pelvis.  Additionally, the patient had a exophytic 28m nodule from the thyroid isthmus.  The patient underwent a left frontal craniotomy for tumor resection on 06/29/2019.  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  female Asian patient with no prior history of smoking with newly diagnosed lung cancer brain metastases is here for follow-up.  [facility]showed 2.  6 cm left frontal mass suspicious of malignancy; also spiculated right middle lobe lung nodule up to 11 mm in size.  Given the unusual presentation patient underwent craniotomy resection of the frontal lobe malignancy.   Pathology positive for adenocarcinoma suggestive of lung malignancy.  Patient is currently status post SBRT to the brain lesion and also SBRT to the lung lesion.  Patient currently is at home.  Appetite is good.  She is slightly gaining weight.  No nausea vomiting.  Continues have shortness of breath on exertion.  No fever no chills.   Review of Systems  Constitutional: Positive for malaise/fatigue and weight loss. Negative for chills, diaphoresis and fever.  HENT: Negative for nosebleeds and sore throat.   Eyes: Negative for double vision.  Respiratory: Positive for shortness of breath. Negative for cough, hemoptysis, sputum production and wheezing.   Cardiovascular: Negative for chest pain, palpitations, orthopnea and leg swelling.  Gastrointestinal: Negative for abdominal pain, blood in stool, constipation, diarrhea, heartburn, melena, nausea and vomiting.  Genitourinary: Negative for dysuria, frequency and urgency.  Musculoskeletal: Negative for back pain and joint pain.  Skin: Negative.  Negative for itching and rash.  Neurological: Negative for dizziness, tingling, focal weakness, weakness and headaches.  Endo/Heme/Allergies: Does not bruise/bleed easily.  Psychiatric/Behavioral: Negative for depression. The patient is not nervous/anxious and does not have insomnia.      MEDICAL HISTORY:  Past Medical History:  Diagnosis Date  . Anxiety   . Hypertension   . Lung cancer ([facility]    Right Middle Lobe    SURGICAL HISTORY: Past Surgical History:  Procedure Laterality Date  . APPLICATION OF CRANIAL NAVIGATION N/A 06/29/2019   Procedure: APPLICATION OF CRANIAL NAVIGATION;  Surgeon: OJudith Part MD;  [facility]  Service: Neurosurgery;  Laterality:  N/A;  . CRANIOTOMY Left 06/29/2019   Procedure: LEFT CRANIOTOMY FOR TUMOR EXCISION;  Surgeon: Judith Part, MD;  Location: [facility];  Service: Neurosurgery;  Laterality: Left;    SOCIAL HISTORY: Social History    Socioeconomic History  . Marital status: Married    Spouse name: Not on file  . Number of children: Not on file  . Years of education: Not on file  . Highest education level: Not on file  Occupational History  . Not on file  Tobacco Use  . Smoking status: Never Smoker  . Smokeless tobacco: Never Used  Vaping Use  . Vaping Use: Never used  Substance and Sexual Activity  . Alcohol use: Never  . Drug use: Never  . Sexual activity: Not Currently    Birth control/protection: Post-menopausal  Other Topics Concern  . 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  She lives with her children in [place].   Social Determinants of Health   Financial Resource Strain:   . Difficulty of Paying Living Expenses:   Food Insecurity:   . Worried About Charity fundraiser in the Last Year:   . Arboriculturist in the Last Year:   Transportation Needs:   . Film/video editor (Medical):   Marland Kitchen Lack of Transportation (Non-Medical):   Physical Activity:   . Days of Exercise per Week:   . Minutes of Exercise per Session:   Stress:   . Feeling of Stress :   Social Connections:   . Frequency of Communication with Friends and Family:   . Frequency of Social Gatherings with Friends and Family:   . Attends Religious Services:   . Active Member of Clubs or Organizations:   . Attends Archivist Meetings:   Marland Kitchen Marital Status:   Intimate Partner Violence:   . Fear of Current or Ex-Partner:   . Emotionally Abused:   Marland Kitchen Physically Abused:   . Sexually Abused:     FAMILY HISTORY: Family History  Problem Relation Age of Onset  . Cancer Son        some type of cancer involving the brain    ALLERGIES:  is allergic to lisinopril.  MEDICATIONS:  Current Outpatient Medications  Medication Sig Dispense Refill  . acetaminophen (TYLENOL) 325 MG tablet Take 2 tablets (650 mg total) by mouth every 4 (four) hours as needed  for mild pain (temp > 101.5).    Marland Kitchen levETIRAcetam (KEPPRA) 1000 MG tablet Take 1 tablet (1,000 mg total) by mouth 2 (two) times daily. 60 tablet 0  . LORazepam (ATIVAN) 0.5 MG tablet Take 1/2 tablet po 30 minutes prior to brain radiation or brain MRI scans. Please write instructions in patients native language: Santiago Glad (if possible) 20 tablet 0  . Multiple Vitamin (MULTIVITAMIN WITH MINERALS) TABS tablet Take 1 tablet by mouth daily. 60 tablet 0  . omeprazole (PRILOSEC) 20 MG capsule Take 1 capsule (20 mg total) by mouth daily. 30 capsule 0  . pravastatin (PRAVACHOL) 20 MG tablet Take 1 tablet (20 mg total) by mouth every evening. 30 tablet 0  . traZODone (DESYREL) 50 MG tablet Take 2 tablets (100 mg total) by mouth at bedtime. 30 tablet 0  . docusate sodium (COLACE) 100 MG capsule Take 1 capsule (100 mg total) by mouth 2 (two) times daily. (Patient not taking: Reported on 08/11/2019) 10 capsule 0  . osimertinib mesylate (TAGRISSO) 40 MG  tablet Take 2 tablets (80 mg total) by mouth daily. 60 tablet 3   No current facility-administered medications for this visit.      Marland Kitchen  PHYSICAL EXAMINATION: ECOG PERFORMANCE STATUS: 2 - Symptomatic, <50% confined to bed  Vitals:   08/11/19 0910  BP: (!) 113/43  Pulse: 99  Resp: 16  Temp: 97.8 F (36.6 C)  SpO2: 100%   Filed Weights   08/11/19 0910  Weight: 116 lb (52.6 kg)    Physical Exam Constitutional:      Comments: Thin built cachectic Asian woman; in a wheelchair.  Accompanied by son.  HENT:     Head: Normocephalic and atraumatic.     Mouth/Throat:     Pharynx: No oropharyngeal exudate.  Eyes:     Pupils: Pupils are equal, round, and reactive to light.  Cardiovascular:     Rate and Rhythm: Normal rate and regular rhythm.  Pulmonary:     Effort: Pulmonary effort is normal. No respiratory distress.     Breath sounds: Normal breath sounds. No wheezing.  Abdominal:     General: Bowel sounds are normal. There is no distension.      Palpations: Abdomen is soft. There is no mass.     Tenderness: There is no abdominal tenderness. There is no guarding or rebound.  Musculoskeletal:        General: No tenderness. Normal range of motion.     Cervical back: Normal range of motion and neck supple.  Skin:    General: Skin is warm.  Neurological:     Mental Status: She is alert and oriented to person, place, and time.  Psychiatric:        Mood and Affect: Affect normal.      LABORATORY DATA:  I have reviewed the data as listed Lab Results  Component Value Date   WBC 5.1 08/11/2019   HGB 11.6 (L) 08/11/2019   HCT 35.8 (L) 08/11/2019   MCV 93.5 08/11/2019   PLT 207 08/11/2019   Recent Labs    06/27/19 0928 06/28/19 0720 07/07/19 0644 07/10/19 0611 08/11/19 1035  NA 139   < > 138 137 142  K 4.0   < > 4.0 3.9 3.6  CL 109   < > 102 103 105  CO2 19*   < > 26 23 26   GLUCOSE 147*   < > 140* 127* 112*  BUN 15   < > 24* 22 10  CREATININE 0.67   < > 0.75 0.68 0.59  CALCIUM 8.9   < > 9.5 9.5 9.1  GFRNONAA >60   < > >60 >60 >60  GFRAA >60   < > >60 >60 >60  PROT 6.1*  --   --  7.3 7.5  ALBUMIN 3.4*  --   --  3.5 4.5  AST 26  --   --  27 18  ALT 17  --   --  58* 15  ALKPHOS 60  --   --  70 68  BILITOT 1.2  --   --  0.7 1.0   < > = values in this interval not displayed.    RADIOGRAPHIC STUDIES: I have personally reviewed the radiological images as listed and agreed with the findings in the report. MR Brain W Wo Contrast  Result Date: 07/19/2019 CLINICAL DATA:  CNS neoplasm status post treatment. EXAM: MRI HEAD WITHOUT AND WITH CONTRAST TECHNIQUE: Multiplanar, multiecho pulse sequences of the brain and surrounding structures were obtained without and with intravenous  contrast. CONTRAST:  47m GADAVIST GADOBUTROL 1 MMOL/ML IV SOLN COMPARISON:  MRI of the brain Jun 30, 2019 FINDINGS: Brain: Status post left frontal craniotomy with subjacent dural thickening and surgical cavity noted in the left frontal lobe and  surrounding vasogenic edema which appear unchanged. A surgical cavity is filled with T1 hyperintense fluid, likely representing blood products. New irregular contrast enhancement is seen surrounding the surgical cavity which may be related to post surgical ischemic changes. Continued follow-up is advised. The Vascular: Normal flow voids. Skull and upper cervical spine: Normal marrow signal. Sinuses/Orbits: Negative. Other: None. IMPRESSION: Status post left frontal craniotomy with subjacent surgical cavity in the left frontal lobe and surrounding vasogenic edema which appear unchanged. New irregular contrast enhancement surrounding the surgical cavity which may be related to post surgical ischemic changes though continued follow-up is advised. Electronically Signed   By: KPedro EarlsM.D.   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  Patient currently s/p SBRT solitary lung lesion; also SRS brain radiation.  NGS- positive for EGFR mutation.  #Recommend patient starting on osimertinib; start initially 40 mg a day x2 weeks and then if tolerating well proceed to 80 mg a day.   #Long discussion the patient/son-regarding overall poor prognosis; incurable nature of the disease.  However, discussed that median PFS in the range of 2 to 3 years.  Discussed the potential side effects including but not limited to diarrhea skin rash EKG abnormalities.  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  Currently on Keppra.  Thank you, for allowing me to participate in the care of your pleasant patient. Please do not hesitate to contact me with questions or concerns in the interim.  # I reviewed the blood work- with the patient in detail; also reviewed the imaging independently [as summarized above]; and with the patient in detail.    # DISPOSITION: # labs today- cbc/cmp # follow up in 3 weeks- MD: labs- cbc/cmp-Dr.B  All questions were answered. The patient knows to call the clinic with any problems, questions or concerns.   GCammie Sickle MD 08/17/2019 3:59 PM

## 2019-08-12 ENCOUNTER — Telehealth: Payer: Self-pay | Admitting: Internal Medicine

## 2019-08-12 NOTE — Telephone Encounter (Signed)
On 6/18- spoke to pt. Son, day -discussed the diagnosis; pathology-stage IV lung cancer brain metastases.  Incurable disease; given EGFR positive would recommend osimertinib.   Follow up as planned.  GB

## 2019-08-17 ENCOUNTER — Encounter: Payer: Self-pay | Admitting: Internal Medicine

## 2019-08-17 NOTE — Telephone Encounter (Signed)
Oral Chemotherapy Pharmacist Encounter  Spoke with patient's son Day about the Tagrisso medication. He knows that his mom is not to start the Tagrisso until given the go ahead by Dr. Bradd Canary.  Patient Education I spoke with Day for overview of new oral chemotherapy medication: Tagrisso (osimertinib) for the treatment of metastatic lung adenocarcinoma, EGFR exon 19 deletion positive, planned duration until disease progression or unacceptable drug toxicity.   Counseled Day on administration, dosing, side effects, monitoring, drug-food interactions, safe handling, storage, and disposal. Patient will take 1 tablet (40 mg total) by mouth daily, then increase to 2 tablets (80 mg) if tolerated.  Side effects include but not limited to: diarrhea, rash/itchy skin, decreased wbc/hgb/plt.    Reviewed with Day the importance of keeping a medication schedule and plan for any missed doses.  Day voiced understanding and appreciation. All questions answered. Medication handout placed in the mail.  Provided Day with Oral Chemotherapy Navigation Clinic phone number. Day knows to call the office with questions or concerns. Oral Chemotherapy Navigation Clinic will continue to follow.  Darl Pikes, PharmD, BCPS, BCOP, CPP Hematology/Oncology Clinical Pharmacist Practitioner [facility] [phone]  08/17/2019 3:51 PM

## 2019-08-18 NOTE — Telephone Encounter (Signed)
Oral Oncology Patient Advocate Encounter  I spoke with Day, patients son, on 08/14/19 to set up delivery of Tagrisso.  Address verified for shipment.  [facility] Nip will be filled through [facility] and mailed 08/23/19 for delivery 08/24/2019.  Son was instructed to bring medication to appointment and to not start until MD instructs her to.    [place] will call 7-10 days before next refill is due to complete adherence call and set up delivery of medication.     [place] [facility] Phone [phone] Fax [phone] 08/18/2019 11:29 AM

## 2019-08-22 ENCOUNTER — Telehealth: Payer: Self-pay | Admitting: *Deleted

## 2019-08-22 NOTE — Telephone Encounter (Signed)
Called patient to inform of Pet Scan for 08-31-19 - arrival time- 11:30 am @ [facility], patient to have water only after 6 am, lvm for a return call

## 2019-08-23 MED FILL — TAGRISSO 40 MG TABLET: 40 | 30 days supply | Qty: 60 | Fill #0

## 2019-08-31 ENCOUNTER — Ambulatory Visit (HOSPITAL_COMMUNITY): Admission: RE | Admit: 2019-08-31 | Payer: Medicare Other | Source: Ambulatory Visit

## 2019-09-01 ENCOUNTER — Inpatient Hospital Stay: Payer: Medicare Other | Attending: Internal Medicine

## 2019-09-01 ENCOUNTER — Other Ambulatory Visit: Payer: Self-pay

## 2019-09-01 ENCOUNTER — Inpatient Hospital Stay (HOSPITAL_BASED_OUTPATIENT_CLINIC_OR_DEPARTMENT_OTHER): Payer: Medicare Other | Admitting: Internal Medicine

## 2019-09-01 ENCOUNTER — Encounter: Payer: Self-pay | Admitting: Internal Medicine

## 2019-09-01 ENCOUNTER — Encounter: Payer: Self-pay | Admitting: *Deleted

## 2019-09-01 VITALS — BP 151/76 | HR 84 | Temp 97.8°F | Wt 116.0 lb

## 2019-09-01 DIAGNOSIS — F419 Anxiety disorder, unspecified: Secondary | ICD-10-CM | POA: Insufficient documentation

## 2019-09-01 DIAGNOSIS — Z79899 Other long term (current) drug therapy: Secondary | ICD-10-CM | POA: Insufficient documentation

## 2019-09-01 DIAGNOSIS — R569 Unspecified convulsions: Secondary | ICD-10-CM | POA: Diagnosis not present

## 2019-09-01 DIAGNOSIS — C7931 Secondary malignant neoplasm of brain: Secondary | ICD-10-CM | POA: Diagnosis not present

## 2019-09-01 DIAGNOSIS — I10 Essential (primary) hypertension: Secondary | ICD-10-CM | POA: Diagnosis not present

## 2019-09-01 DIAGNOSIS — Z923 Personal history of irradiation: Secondary | ICD-10-CM | POA: Insufficient documentation

## 2019-09-01 DIAGNOSIS — C342 Malignant neoplasm of middle lobe, bronchus or lung: Secondary | ICD-10-CM

## 2019-09-01 LAB — COMPREHENSIVE METABOLIC PANEL
ALT: 12 U/L (ref 0–44)
AST: 15 U/L (ref 15–41)
Albumin: 4.2 g/dL (ref 3.5–5.0)
Alkaline Phosphatase: 62 U/L (ref 38–126)
Anion gap: 10 (ref 5–15)
BUN: 8 mg/dL (ref 8–23)
CO2: 27 mmol/L (ref 22–32)
Calcium: 9.2 mg/dL (ref 8.9–10.3)
Chloride: 106 mmol/L (ref 98–111)
Creatinine, Ser: 0.57 mg/dL (ref 0.44–1.00)
GFR calc Af Amer: >60 mL/min (ref >60)
GFR calc non Af Amer: >60 mL/min (ref >60)
Glucose, Bld: 114 mg/dL — ABNORMAL HIGH (ref 70–99)
Potassium: 3.6 mmol/L (ref 3.5–5.1)
Sodium: 143 mmol/L (ref 135–145)
Total Bilirubin: 0.7 mg/dL (ref 0.3–1.2)
Total Protein: 7.2 g/dL (ref 6.5–8.1)

## 2019-09-01 LAB — CBC WITH DIFFERENTIAL/PLATELET
Abs Immature Granulocytes: 0.01 10*3/uL (ref 0.00–0.07)
Basophils Absolute: 0.1 10*3/uL (ref 0.0–0.1)
Basophils Relative: 1 %
Eosinophils Absolute: 0.2 10*3/uL (ref 0.0–0.5)
Eosinophils Relative: 4 %
HCT: 34.6 % — ABNORMAL LOW (ref 36.0–46.0)
Hemoglobin: 11.7 g/dL — ABNORMAL LOW (ref 12.0–15.0)
Immature Granulocytes: 0 %
Lymphocytes Relative: 24 %
Lymphs Abs: 1.2 10*3/uL (ref 0.7–4.0)
MCH: 30.9 pg (ref 26.0–34.0)
MCHC: 33.8 g/dL (ref 30.0–36.0)
MCV: 91.3 fL (ref 80.0–100.0)
Monocytes Absolute: 0.3 10*3/uL (ref 0.1–1.0)
Monocytes Relative: 6 %
Neutro Abs: 3.2 10*3/uL (ref 1.7–7.7)
Neutrophils Relative %: 65 %
Platelets: 220 10*3/uL (ref 150–400)
RBC: 3.79 MIL/uL — ABNORMAL LOW (ref 3.87–5.11)
RDW: 14 % (ref 11.5–15.5)
WBC: 4.9 10*3/uL (ref 4.0–10.5)
nRBC: 0 % (ref 0.0–0.2)

## 2019-09-01 MED ORDER — LEVETIRACETAM 1000 MG PO TABS: 1000.0000 mg | ORAL_TABLET | Freq: Two times a day (BID) | ORAL | 2 refills | Status: DC

## 2019-09-01 NOTE — Patient Instructions (Signed)
#   START TAKING TAGRISSO ONE PILL a DAY; for 2 weeks; and then will increase the dose to 2 pills a day.

## 2019-09-01 NOTE — Assessment & Plan Note (Addendum)
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  Patient currently s/p SBRT solitary lung lesion; also SRS brain radiation.  NGS- positive for EGFR mutation.  # START on osimertinib; start initially 40 mg a day x2 weeks and then if tolerating well proceed to 80 mg a day. Verbal/writtent instructions given. Discussed the potential side effects including but not limited to diarrhea skin rash.   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

## 2019-09-01 NOTE — Progress Notes (Signed)
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  A CT of the chest, abdomen, pelvis without contrast was performed on 06/27/2019 which showed a spiculated right middle lobe pulmonary nodule measuring 11 x 11 x 10 mm.  This had been present on a CT scan performed on 03/01/2018.  There was no evidence of metastatic disease or primary malignancy in the chest, abdomen, pelvis.  Additionally, the patient had a exophytic 70m nodule from the thyroid isthmus.  The patient underwent a left frontal craniotomy for tumor resection on 06/29/2019.  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  female Asian patient metastatic adeno ca of lung  [EGFR mutated] is here for a follow up.  Appetite is good.  Gaining weight.  She is more ambulatory at home.  No new shortness of breath or cough.  No fevers or chills.  No seizures   Review of Systems  Constitutional: Positive for malaise/fatigue and weight loss. Negative for chills, diaphoresis and fever.  HENT: Negative for nosebleeds and  sore throat.   Eyes: Negative for double vision.  Respiratory: Positive for shortness of breath. Negative for cough, hemoptysis, sputum production and wheezing.   Cardiovascular: Negative for chest pain, palpitations, orthopnea and leg swelling.  Gastrointestinal: Negative for abdominal pain, blood in stool, constipation, diarrhea, heartburn, melena, nausea and vomiting.  Genitourinary: Negative for dysuria, frequency and urgency.  Musculoskeletal: Negative for back pain and joint pain.  Skin: Negative.  Negative for itching and rash.  Neurological: Negative for dizziness, tingling, focal weakness, weakness and headaches.  Endo/Heme/Allergies: Does not bruise/bleed easily.  Psychiatric/Behavioral: Negative for depression. The patient is not nervous/anxious and does not have insomnia.      MEDICAL HISTORY:  Past Medical History:  Diagnosis Date  . Anxiety   . Hypertension   . Lung cancer ([facility]    Right Middle Lobe    SURGICAL HISTORY: Past Surgical History:  Procedure Laterality Date  . APPLICATION OF CRANIAL NAVIGATION N/A 06/29/2019   Procedure: APPLICATION OF CRANIAL NAVIGATION;  Surgeon: OJudith Part MD;  [facility]  Service: Neurosurgery;  Laterality: N/A;  . CRANIOTOMY Left 06/29/2019   Procedure: LEFT CRANIOTOMY FOR TUMOR EXCISION;  Surgeon: OJudith Part MD;  [facility]  Service: Neurosurgery;  Laterality: Left;    SOCIAL HISTORY: Social History   Socioeconomic History  . Marital status: Married    Spouse name: Not on file  . Number of children: Not on file  . Years of education: Not on file  . Highest  education level: Not on file  Occupational History  . Not on file  Tobacco Use  . Smoking status: Never Smoker  . Smokeless tobacco: Never Used  Vaping Use  . Vaping Use: Never used  Substance and Sexual Activity  . Alcohol use: Never  . Drug use: Never  . Sexual activity: Not Currently    Birth control/protection: Post-menopausal  Other  Topics Concern  . 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  She lives with her children in [place].   Social Determinants of Health   Financial Resource Strain:   . Difficulty of Paying Living Expenses:   Food Insecurity:   . Worried About Charity fundraiser in the Last Year:   . Arboriculturist in the Last Year:   Transportation Needs:   . Film/video editor (Medical):   Marland Kitchen Lack of Transportation (Non-Medical):   Physical Activity:   . Days of Exercise per Week:   . Minutes of Exercise per Session:   Stress:   . Feeling of Stress :   Social Connections:   . Frequency of Communication with Friends and Family:   . Frequency of Social Gatherings with Friends and Family:   . Attends Religious Services:   . Active Member of Clubs or Organizations:   . Attends Archivist Meetings:   Marland Kitchen Marital Status:   Intimate Partner Violence:   . Fear of Current or Ex-Partner:   . Emotionally Abused:   Marland Kitchen Physically Abused:   . Sexually Abused:     FAMILY HISTORY: Family History  Problem Relation Age of Onset  . Cancer Son        some type of cancer involving the brain    ALLERGIES:  is allergic to lisinopril.  MEDICATIONS:  Current Outpatient Medications  Medication Sig Dispense Refill  . acetaminophen (TYLENOL) 325 MG tablet Take 2 tablets (650 mg total) by mouth every 4 (four) hours as needed for mild pain (temp > 101.5).    Marland Kitchen levETIRAcetam (KEPPRA) 1000 MG tablet Take 1 tablet (1,000 mg total) by mouth 2 (two) times daily. 60 tablet 2  . Multiple Vitamin (MULTIVITAMIN WITH MINERALS) TABS tablet Take 1 tablet by mouth daily. 60 tablet 0  . omeprazole (PRILOSEC) 20 MG capsule Take 1 capsule (20 mg total) by mouth daily. 30 capsule 0  . osimertinib mesylate (TAGRISSO) 40 MG tablet Take 2 tablets (80 mg total) by mouth daily. 60 tablet 3  . traZODone (DESYREL) 50 MG tablet Take 2 tablets (100 mg total)  by mouth at bedtime. 30 tablet 0  . docusate sodium (COLACE) 100 MG capsule Take 1 capsule (100 mg total) by mouth 2 (two) times daily. (Patient not taking: Reported on 08/11/2019) 10 capsule 0  . LORazepam (ATIVAN) 0.5 MG tablet Take 1/2 tablet po 30 minutes prior to brain radiation or brain MRI scans. Please write instructions in patients native language: Santiago Glad (if possible) 20 tablet 0  . pravastatin (PRAVACHOL) 20 MG tablet Take 1 tablet (20 mg total) by mouth every evening. (Patient not taking: Reported on 09/01/2019) 30 tablet 0   No current facility-administered medications for this visit.      Marland Kitchen  PHYSICAL EXAMINATION: ECOG PERFORMANCE STATUS: 2 - Symptomatic, <50% confined to bed  Vitals:   09/01/19 0947  BP: (!) 151/76  Pulse: 84  Temp: 97.8 F (36.6 C)  SpO2: 100%   Filed Weights  09/01/19 0947  Weight: 116 lb (52.6 kg)    Physical Exam Constitutional:      Comments: Thin built cachectic Asian woman; in a wheelchair.  Accompanied by son.  HENT:     Head: Normocephalic and atraumatic.     Mouth/Throat:     Pharynx: No oropharyngeal exudate.  Eyes:     Pupils: Pupils are equal, round, and reactive to light.  Cardiovascular:     Rate and Rhythm: Normal rate and regular rhythm.  Pulmonary:     Effort: Pulmonary effort is normal. No respiratory distress.     Breath sounds: Normal breath sounds. No wheezing.  Abdominal:     General: Bowel sounds are normal. There is no distension.     Palpations: Abdomen is soft. There is no mass.     Tenderness: There is no abdominal tenderness. There is no guarding or rebound.  Musculoskeletal:        General: No tenderness. Normal range of motion.     Cervical back: Normal range of motion and neck supple.  Skin:    General: Skin is warm.  Neurological:     Mental Status: She is alert and oriented to person, place, and time.  Psychiatric:        Mood and Affect: Affect normal.      LABORATORY DATA:  I have reviewed the  data as listed Lab Results  Component Value Date   WBC 4.9 09/01/2019   HGB 11.7 (L) 09/01/2019   HCT 34.6 (L) 09/01/2019   MCV 91.3 09/01/2019   PLT 220 09/01/2019   Recent Labs    07/10/19 0611 08/11/19 1035 09/01/19 0934  NA 137 142 143  K 3.9 3.6 3.6  CL 103 105 106  CO2 23 26 27   GLUCOSE 127* 112* 114*  BUN 22 10 8   CREATININE 0.68 0.59 0.57  CALCIUM 9.5 9.1 9.2  GFRNONAA >60 >60 >60  GFRAA >60 >60 >60  PROT 7.3 7.5 7.2  ALBUMIN 3.5 4.5 4.2  AST 27 18 15   ALT 58* 15 12  ALKPHOS 70 68 62  BILITOT 0.7 1.0 0.7    RADIOGRAPHIC STUDIES: I have personally reviewed the radiological images as listed and agreed with the findings in the report. No results found.  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  Patient currently s/p SBRT solitary lung lesion; also SRS brain radiation.  NGS- positive for EGFR mutation.  # START on osimertinib; start initially 40 mg a day x2 weeks and then if tolerating well proceed to 80 mg a day. Verbal/writtent instructions given. Discussed the potential side effects including but not limited to diarrhea skin rash.   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 The patient knows to call the clinic with any problems, questions or concerns.   Cammie Sickle, MD 09/01/2019 10:44 AM

## 2019-09-01 NOTE — Progress Notes (Signed)
  Oncology Nurse Navigator Documentation  Navigator Location: [facility]Med Onc (09/01/19 1500)   )Navigator Encounter Type: Appt/Treatment Plan Review (09/01/19 1500)                     Patient Visit Type: MedOnc (09/01/19 1500)   Barriers/Navigation Needs: No Barriers At This Time (09/01/19 1500)   Interventions: None Required (09/01/19 1500)                      Time Spent with Patient: 30 (09/01/19 1500)

## 2019-09-04 ENCOUNTER — Ambulatory Visit: Payer: Self-pay | Admitting: Radiation Oncology

## 2019-09-04 ENCOUNTER — Ambulatory Visit
Admission: RE | Admit: 2019-09-04 | Discharge: 2019-09-04 | Disposition: A | Discharge status: Home / Self Care | Payer: Medicare Other | Source: Ambulatory Visit | Attending: Radiation Oncology | Admitting: Radiation Oncology

## 2019-09-04 DIAGNOSIS — C7931 Secondary malignant neoplasm of brain: Secondary | ICD-10-CM

## 2019-09-04 DIAGNOSIS — C342 Malignant neoplasm of middle lobe, bronchus or lung: Secondary | ICD-10-CM

## 2019-09-04 NOTE — Progress Notes (Signed)
  Radiation Oncology         [phone] ________________________________  Name: Sara Schroeder MRN: [id number]  Date of Service: 09/04/2019  DOB: June 03, 1950  Post Treatment Telephone Note  Diagnosis: Stage IV, [id number], NSCLC, Adenocarcinoma with EGFR Mutation of the RML of the lung with brain metastasis.  Interval Since Last Radiation:  5 weeks    07/26/19-08/02/19: SBRT Treatment  The RML target was treated to 54 Gy in 3 fractions.  07/21/19-07/28/19 SRS Treatment: PTV1 Left Frontal Craniotomy Site was treated to 27 Gy in 3 fractions  Narrative:  The patient'[s son was contacted today for routine follow-up. During treatment she did very well with radiotherapy and did not have significant desquamation.   Impression/Plan: 1. Stage IV, [id number], NSCLC, Adenocarcinoma with EGFR Mutation of the RML of the lung with brain metastasis. I was unable to reach the patient's son but left a voicemail describing our anticipated follow up in brain oncology clinic with repeat MRI of the brain 3 months from the treatment date. She will also continue to follow up with Dr. Burlene Arnt in medical oncology.     Carola Rhine, PAC

## 2019-09-11 ENCOUNTER — Telehealth: Payer: Self-pay | Admitting: *Deleted

## 2019-09-11 DIAGNOSIS — R110 Nausea: Secondary | ICD-10-CM

## 2019-09-11 DIAGNOSIS — T451X5A Adverse effect of antineoplastic and immunosuppressive drugs, initial encounter: Secondary | ICD-10-CM

## 2019-09-11 MED ORDER — ONDANSETRON HCL 8 MG PO TABS: 8.0000 mg | ORAL_TABLET | Freq: Three times a day (TID) | ORAL | 3 refills | Status: DC | PRN

## 2019-09-11 NOTE — Telephone Encounter (Signed)
Sara Schroeder- patient is currently taking Tagrisso. What antiemetic would you like to prescribe for patient?

## 2019-09-11 NOTE — Telephone Encounter (Signed)
Son called reporting that the medicine patient is taking is causing nausea. I do not see that she has any antiemetics ordered. Please advise

## 2019-09-11 NOTE — Telephone Encounter (Signed)
Call returned to son Day and advised of prescription sent to pharmacy. The acknowledged this and then started asking about home remedies such as lime juice and ginger and mango. I told him ginger should be fine, but the citric acid in the li,me juice may cause some gastric irritation if she has not been eating. He voiced understanding

## 2019-09-11 NOTE — Telephone Encounter (Signed)
Sara Schroeder, please let son know that the prescription for zofran was sent to the [facility] (pharmacy listed).

## 2019-09-11 NOTE — Telephone Encounter (Signed)
Zofran would be fine- 8 mg q 8 hours. Please

## 2019-09-15 ENCOUNTER — Encounter: Payer: Self-pay | Admitting: Internal Medicine

## 2019-09-15 ENCOUNTER — Inpatient Hospital Stay (HOSPITAL_BASED_OUTPATIENT_CLINIC_OR_DEPARTMENT_OTHER): Payer: Medicare Other | Admitting: Internal Medicine

## 2019-09-15 ENCOUNTER — Inpatient Hospital Stay: Payer: Medicare Other

## 2019-09-15 ENCOUNTER — Other Ambulatory Visit: Payer: Self-pay

## 2019-09-15 DIAGNOSIS — C342 Malignant neoplasm of middle lobe, bronchus or lung: Secondary | ICD-10-CM | POA: Diagnosis not present

## 2019-09-15 LAB — CBC WITH DIFFERENTIAL/PLATELET
Abs Immature Granulocytes: 0 10*3/uL (ref 0.00–0.07)
Basophils Absolute: 0 10*3/uL (ref 0.0–0.1)
Basophils Relative: 1 %
Eosinophils Absolute: 0.2 10*3/uL (ref 0.0–0.5)
Eosinophils Relative: 3 %
HCT: 33.5 % — ABNORMAL LOW (ref 36.0–46.0)
Hemoglobin: 11 g/dL — ABNORMAL LOW (ref 12.0–15.0)
Immature Granulocytes: 0 %
Lymphocytes Relative: 29 %
Lymphs Abs: 1.4 10*3/uL (ref 0.7–4.0)
MCH: 30.5 pg (ref 26.0–34.0)
MCHC: 32.8 g/dL (ref 30.0–36.0)
MCV: 92.8 fL (ref 80.0–100.0)
Monocytes Absolute: 0.3 10*3/uL (ref 0.1–1.0)
Monocytes Relative: 7 %
Neutro Abs: 3 10*3/uL (ref 1.7–7.7)
Neutrophils Relative %: 60 %
Platelets: 160 10*3/uL (ref 150–400)
RBC: 3.61 MIL/uL — ABNORMAL LOW (ref 3.87–5.11)
RDW: 14.2 % (ref 11.5–15.5)
WBC: 5 10*3/uL (ref 4.0–10.5)
nRBC: 0 % (ref 0.0–0.2)

## 2019-09-15 LAB — COMPREHENSIVE METABOLIC PANEL
ALT: 14 U/L (ref 0–44)
AST: 16 U/L (ref 15–41)
Albumin: 4.3 g/dL (ref 3.5–5.0)
Alkaline Phosphatase: 61 U/L (ref 38–126)
Anion gap: 5 (ref 5–15)
BUN: 10 mg/dL (ref 8–23)
CO2: 28 mmol/L (ref 22–32)
Calcium: 8.9 mg/dL (ref 8.9–10.3)
Chloride: 106 mmol/L (ref 98–111)
Creatinine, Ser: 0.74 mg/dL (ref 0.44–1.00)
GFR calc Af Amer: >60 mL/min (ref >60)
GFR calc non Af Amer: >60 mL/min (ref >60)
Glucose, Bld: 108 mg/dL — ABNORMAL HIGH (ref 70–99)
Potassium: 4.1 mmol/L (ref 3.5–5.1)
Sodium: 139 mmol/L (ref 135–145)
Total Bilirubin: 0.9 mg/dL (ref 0.3–1.2)
Total Protein: 7.4 g/dL (ref 6.5–8.1)

## 2019-09-15 NOTE — Assessment & Plan Note (Addendum)
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 Currently on Osimertinib 40 mg/day; however feels nausea. If tolerating well at next visit will increase to 80 mg/day.   # recommend osimertinib continue at 40 mg/day [secto dizziness]  # Nausea-grade 1-2 ; sec to Osimertinib; recommend taking Zofran 30- 60 mins prior; along with meals.  # Dizzyness- ? Sec to Osi-recommend increase fluid intake.   # Seizure-secondary to brain metastases continue keppra BID;STABLE.   # DISPOSITION: # follow up in 2 weeks- MD: labs- cbc/cmp-Dr.B

## 2019-09-15 NOTE — Progress Notes (Signed)
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  A CT of the chest, abdomen, pelvis without contrast was performed on 06/27/2019 which showed a spiculated right middle lobe pulmonary nodule measuring 11 x 11 x 10 mm.  This had been present on a CT scan performed on 03/01/2018.  There was no evidence of metastatic disease or primary malignancy in the chest, abdomen, pelvis.  Additionally, the patient had a exophytic 17m nodule from the thyroid isthmus.  The patient underwent a left frontal craniotomy for tumor resection on 06/29/2019.  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  female Asian patient metastatic adeno ca of lung  [EGFR mutated] is here for a follow up-currently on osimertinib 40 mg a day is here for follow-up.  Patient has been on osimertinib for the last 2 weeks.  Complains of mild to moderate nausea needing to take Zofran.  Also complains of intermittent dizziness but no falls.  Otherwise she is more ambulatory at home.  No seizures.  Gaining weight.   Shortness of breath is improved.  No fevers or chills.   Review of Systems  Constitutional: Positive for malaise/fatigue and weight loss. Negative for chills, diaphoresis and fever.  HENT: Negative for nosebleeds and sore throat.   Eyes: Negative for double vision.  Respiratory: Positive for shortness of breath. Negative for cough, hemoptysis, sputum production and wheezing.   Cardiovascular: Negative for chest pain, palpitations, orthopnea and leg swelling.  Gastrointestinal: Negative for abdominal pain, blood in stool, constipation, diarrhea, heartburn, melena, nausea and vomiting.  Genitourinary: Negative for dysuria, frequency and urgency.  Musculoskeletal: Negative for back pain and joint pain.  Skin: Negative.  Negative for itching and rash.  Neurological: Positive for dizziness. Negative for tingling, focal weakness, weakness and headaches.  Endo/Heme/Allergies: Does not bruise/bleed easily.  Psychiatric/Behavioral: Negative for depression. The patient is not nervous/anxious and does not have insomnia.      MEDICAL HISTORY:  Past Medical History:  Diagnosis Date  . Anxiety   . Hypertension   . Lung cancer ([facility]    Right Middle Lobe    SURGICAL HISTORY: Past Surgical History:  Procedure Laterality Date  . APPLICATION OF CRANIAL NAVIGATION N/A 06/29/2019   Procedure: APPLICATION OF CRANIAL NAVIGATION;  Surgeon: OJudith Part MD;  [facility]  Service: Neurosurgery;  Laterality: N/A;  . CRANIOTOMY Left 06/29/2019   Procedure: LEFT CRANIOTOMY FOR TUMOR EXCISION;  Surgeon: OJudith Part MD;  Location[facility]  Service: Neurosurgery;  Laterality: Left;    SOCIAL HISTORY:  Social History   Socioeconomic History  . Marital status: Married    Spouse name: Not on file  . Number of children: Not on file  . Years of education: Not on file  . Highest education level: Not on file  Occupational History  . Not on file  Tobacco Use  . Smoking status: Never Smoker   . Smokeless tobacco: Never Used  Vaping Use  . Vaping Use: Never used  Substance and Sexual Activity  . Alcohol use: Never  . Drug use: Never  . Sexual activity: Not Currently    Birth control/protection: Post-menopausal  Other Topics Concern  . 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  She lives with her children in [place].   Social Determinants of Health   Financial Resource Strain:   . Difficulty of Paying Living Expenses:   Food Insecurity:   . Worried About Charity fundraiser in the Last Year:   . Arboriculturist in the Last Year:   Transportation Needs:   . Film/video editor (Medical):   Marland Kitchen Lack of Transportation (Non-Medical):   Physical Activity:   . Days of Exercise per Week:   . Minutes of Exercise per Session:   Stress:   . Feeling of Stress :   Social Connections:   . Frequency of Communication with Friends and Family:   . Frequency of Social Gatherings with Friends and Family:   . Attends Religious Services:   . Active Member of Clubs or Organizations:   . Attends Archivist Meetings:   Marland Kitchen Marital Status:   Intimate Partner Violence:   . Fear of Current or Ex-Partner:   . Emotionally Abused:   Marland Kitchen Physically Abused:   . Sexually Abused:     FAMILY HISTORY: Family History  Problem Relation Age of Onset  . Cancer Son        some type of cancer involving the brain    ALLERGIES:  is allergic to lisinopril.  MEDICATIONS:  Current Outpatient Medications  Medication Sig Dispense Refill  . acetaminophen (TYLENOL) 325 MG tablet Take 2 tablets (650 mg total) by mouth every 4 (four) hours as needed for mild pain (temp > 101.5).    Marland Kitchen levETIRAcetam (KEPPRA) 1000 MG tablet Take 1 tablet (1,000 mg total) by mouth 2 (two) times daily. 60 tablet 2  . LORazepam (ATIVAN) 0.5 MG tablet Take 1/2 tablet po 30 minutes prior to brain radiation or brain MRI scans. Please write  instructions in patients native language: Santiago Glad (if possible) 20 tablet 0  . omeprazole (PRILOSEC) 20 MG capsule Take 1 capsule (20 mg total) by mouth daily. 30 capsule 0  . ondansetron (ZOFRAN) 8 MG tablet Take 1 tablet (8 mg total) by mouth every 8 (eight) hours as needed for nausea or vomiting. 20 tablet 3  . osimertinib mesylate (TAGRISSO) 40 MG tablet Take 2 tablets (80 mg total) by mouth daily. 60 tablet 3  . traZODone (DESYREL) 50 MG tablet Take 2 tablets (100 mg total) by mouth at bedtime. 30 tablet 0  . docusate sodium (COLACE) 100 MG capsule Take 1 capsule (100 mg total) by mouth 2 (two) times daily. (Patient not taking: Reported on 09/15/2019) 10 capsule 0  . pravastatin (PRAVACHOL) 20 MG tablet Take 1 tablet (20 mg total) by mouth every evening. (Patient not taking: Reported on 09/15/2019) 30 tablet 0   No current facility-administered medications  for this visit.      Marland Kitchen  PHYSICAL EXAMINATION: ECOG PERFORMANCE STATUS: 2 - Symptomatic, <50% confined to bed  Vitals:   09/15/19 1347  BP: (!) 139/67  Pulse: 83  Resp: 16  Temp: 98.3 F (36.8 C)  SpO2: 99%   Filed Weights   09/15/19 1347  Weight: 118 lb (53.5 kg)    Physical Exam Constitutional:      Comments: Thin built cachectic Asian woman; in a wheelchair.  Accompanied by son.  HENT:     Head: Normocephalic and atraumatic.     Mouth/Throat:     Pharynx: No oropharyngeal exudate.  Eyes:     Pupils: Pupils are equal, round, and reactive to light.  Cardiovascular:     Rate and Rhythm: Normal rate and regular rhythm.  Pulmonary:     Effort: Pulmonary effort is normal. No respiratory distress.     Breath sounds: Normal breath sounds. No wheezing.  Abdominal:     General: Bowel sounds are normal. There is no distension.     Palpations: Abdomen is soft. There is no mass.     Tenderness: There is no abdominal tenderness. There is no guarding or rebound.  Musculoskeletal:        General: No tenderness. Normal range of  motion.     Cervical back: Normal range of motion and neck supple.  Skin:    General: Skin is warm.  Neurological:     Mental Status: She is alert and oriented to person, place, and time.  Psychiatric:        Mood and Affect: Affect normal.      LABORATORY DATA:  I have reviewed the data as listed Lab Results  Component Value Date   WBC 5.0 09/15/2019   HGB 11.0 (L) 09/15/2019   HCT 33.5 (L) 09/15/2019   MCV 92.8 09/15/2019   PLT 160 09/15/2019   Recent Labs    08/11/19 1035 09/01/19 0934 09/15/19 1339  NA 142 143 139  K 3.6 3.6 4.1  CL 105 106 106  CO2 _0 GLUCOSE 112* 114* 108*  BUN _1 CREATININE 0.59 0.57 0.74  CALCIUM 9.1 9.2 8.9  GFRNONAA >60 >60 >60  GFRAA >60 >60 >60  PROT 7.5 7.2 7.4  ALBUMIN 4.5 4.2 4.3  AST _2 ALT _3 ALKPHOS 68 62 61  BILITOT 1.0 0.7 0.9    RADIOGRAPHIC STUDIES: I have personally reviewed the radiological images as listed and agreed with the findings in the report. No results found.  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 Currently on Osimertinib 40 mg/day; however feels nausea. If tolerating well at next visit will increase to 80 mg/day.   # recommend osimertinib continue at 40 mg/day [secto dizziness]  # Nausea-grade 1-2 ; sec to Osimertinib; recommend taking Zofran 30- 60 mins prior; along with meals.  # Dizzyness- ? Sec to Osi-recommend increase fluid intake.   # Seizure-secondary to brain metastases continue keppra BID;STABLE.   # DISPOSITION: # follow up in 2 weeks- MD: labs- cbc/cmp-Dr.B  All questions were answered. The patient knows to call the clinic with any problems, questions or concerns.   Cammie Sickle, MD 09/15/2019 2:39 PM

## 2019-09-18 ENCOUNTER — Encounter: Payer: Self-pay | Admitting: *Deleted

## 2019-09-18 NOTE — Progress Notes (Signed)
  Oncology Nurse Navigator Documentation  Navigator Location: [facility] On[facility] (09/18/19 1400)   )Navigator Encounter Type: Appt/Treatment Plan Review (09/18/19 1400)                   Treatment Initiated Date: 09/01/19 (09/18/19 1400) Patient Visit Type: MedOnc (09/18/19 1400)   Barriers/Navigation Needs: No Barriers At This Time (09/18/19 1400)   Interventions: None Required (09/18/19 1400)                      Time Spent with Patient: 15 (09/18/19 1400)

## 2019-09-20 ENCOUNTER — Telehealth: Payer: Self-pay | Admitting: *Deleted

## 2019-09-20 ENCOUNTER — Telehealth: Payer: Self-pay | Admitting: Internal Medicine

## 2019-09-20 NOTE — Telephone Encounter (Signed)
Spoke to patient's son regarding patient's concerns for frequent visits to the cancer center/concern for blood draws.  I expressed my rationale for close follow-up especially during starting of medication/ramp-up.  Okay to hold off PET scan at this time.  As per patient/family preference -recommend follow up around 8/24- MD; labs. Dr.B

## 2019-09-20 NOTE — Telephone Encounter (Signed)
Will call son later in the afternnon.  GB

## 2019-09-20 NOTE — Telephone Encounter (Addendum)
Son called asking if her appointment can be changed to come in one month instead of the 2 weeks that is scheduled. She does not like to have her labs drawn stating it makes her more tired. Day is requesting a return call to discuss this. I did tell him that the doctor plans to possibly increase her chemotherapy dose at next visit. He states that they can not force her to do something that she does not want to do.

## 2019-09-21 MED FILL — TAGRISSO 40 MG TABLET: 40 | 30 days supply | Qty: 60 | Fill #1

## 2019-10-03 ENCOUNTER — Other Ambulatory Visit: Payer: Medicare Other

## 2019-10-03 ENCOUNTER — Ambulatory Visit: Payer: Medicare Other | Admitting: Internal Medicine

## 2019-10-07 NOTE — Progress Notes (Signed)
  Radiation Oncology         [phone] ________________________________  Name: Sara Schroeder MRN: [id number]  Date: 08/02/2019  DOB: 28-Jan-1951  End of Treatment Note  Diagnosis:       Indication for treatment:  palliative       Radiation treatment dates:    07/21/19 - 08/02/19  Site/dose:    1. Postop high risk target 6 VMAT beams, ExacTrac, Maximum dose 122.8%  2.  Right lung SBRT/IMRT 54 Gy in 3 fractions;  3 VMAT fields  Narrative: The patient tolerated radiation treatment well.   There were no signs of acute toxicity after treatment.  Plan: The patient has completed radiation treatment. The patient will return to radiation oncology clinic for routine followup in one month. I advised the patient to call or return sooner if they have any questions or concerns related to their recovery or treatment. ________________________________  ------------------------------------------------  Jodelle Gross, MD, PhD

## 2019-10-16 ENCOUNTER — Other Ambulatory Visit: Payer: Self-pay | Admitting: Radiation Therapy

## 2019-10-16 DIAGNOSIS — C7931 Secondary malignant neoplasm of brain: Secondary | ICD-10-CM

## 2019-10-16 DIAGNOSIS — C7949 Secondary malignant neoplasm of other parts of nervous system: Secondary | ICD-10-CM

## 2019-10-17 ENCOUNTER — Other Ambulatory Visit: Payer: Self-pay

## 2019-10-17 ENCOUNTER — Inpatient Hospital Stay (HOSPITAL_BASED_OUTPATIENT_CLINIC_OR_DEPARTMENT_OTHER): Payer: Medicare Other | Admitting: Internal Medicine

## 2019-10-17 ENCOUNTER — Inpatient Hospital Stay: Payer: Medicare Other | Attending: Internal Medicine

## 2019-10-17 ENCOUNTER — Encounter: Payer: Self-pay | Admitting: Pharmacist

## 2019-10-17 ENCOUNTER — Encounter: Payer: Self-pay | Admitting: Internal Medicine

## 2019-10-17 VITALS — BP 134/57 | HR 87 | Temp 98.0°F | Resp 16 | Ht 62.0 in | Wt 119.0 lb

## 2019-10-17 DIAGNOSIS — R110 Nausea: Secondary | ICD-10-CM | POA: Insufficient documentation

## 2019-10-17 DIAGNOSIS — C7931 Secondary malignant neoplasm of brain: Secondary | ICD-10-CM | POA: Diagnosis not present

## 2019-10-17 DIAGNOSIS — F419 Anxiety disorder, unspecified: Secondary | ICD-10-CM | POA: Diagnosis not present

## 2019-10-17 DIAGNOSIS — Z79899 Other long term (current) drug therapy: Secondary | ICD-10-CM | POA: Insufficient documentation

## 2019-10-17 DIAGNOSIS — C342 Malignant neoplasm of middle lobe, bronchus or lung: Secondary | ICD-10-CM | POA: Diagnosis present

## 2019-10-17 DIAGNOSIS — C7801 Secondary malignant neoplasm of right lung: Secondary | ICD-10-CM | POA: Insufficient documentation

## 2019-10-17 DIAGNOSIS — I10 Essential (primary) hypertension: Secondary | ICD-10-CM | POA: Insufficient documentation

## 2019-10-17 LAB — CBC WITH DIFFERENTIAL/PLATELET
Abs Immature Granulocytes: 0.01 10*3/uL (ref 0.00–0.07)
Basophils Absolute: 0.1 10*3/uL (ref 0.0–0.1)
Basophils Relative: 1 %
Eosinophils Absolute: 0.2 10*3/uL (ref 0.0–0.5)
Eosinophils Relative: 3 %
HCT: 33.1 % — ABNORMAL LOW (ref 36.0–46.0)
Hemoglobin: 11.1 g/dL — ABNORMAL LOW (ref 12.0–15.0)
Immature Granulocytes: 0 %
Lymphocytes Relative: 25 %
Lymphs Abs: 1.3 10*3/uL (ref 0.7–4.0)
MCH: 30.5 pg (ref 26.0–34.0)
MCHC: 33.5 g/dL (ref 30.0–36.0)
MCV: 90.9 fL (ref 80.0–100.0)
Monocytes Absolute: 0.4 10*3/uL (ref 0.1–1.0)
Monocytes Relative: 9 %
Neutro Abs: 3.1 10*3/uL (ref 1.7–7.7)
Neutrophils Relative %: 62 %
Platelets: 155 10*3/uL (ref 150–400)
RBC: 3.64 MIL/uL — ABNORMAL LOW (ref 3.87–5.11)
RDW: 13.7 % (ref 11.5–15.5)
WBC: 5 10*3/uL (ref 4.0–10.5)
nRBC: 0 % (ref 0.0–0.2)

## 2019-10-17 LAB — COMPREHENSIVE METABOLIC PANEL
ALT: 13 U/L (ref 0–44)
AST: 22 U/L (ref 15–41)
Albumin: 4.4 g/dL (ref 3.5–5.0)
Alkaline Phosphatase: 48 U/L (ref 38–126)
Anion gap: 8 (ref 5–15)
BUN: 13 mg/dL (ref 8–23)
CO2: 28 mmol/L (ref 22–32)
Calcium: 9.1 mg/dL (ref 8.9–10.3)
Chloride: 105 mmol/L (ref 98–111)
Creatinine, Ser: 0.86 mg/dL (ref 0.44–1.00)
GFR calc Af Amer: >60 mL/min (ref >60)
GFR calc non Af Amer: >60 mL/min (ref >60)
Glucose, Bld: 97 mg/dL (ref 70–99)
Potassium: 4.2 mmol/L (ref 3.5–5.1)
Sodium: 141 mmol/L (ref 135–145)
Total Bilirubin: 0.6 mg/dL (ref 0.3–1.2)
Total Protein: 7.6 g/dL (ref 6.5–8.1)

## 2019-10-17 NOTE — Patient Instructions (Signed)
#   STOP KEPPRA/levaciteram  # STOP Newman Nip

## 2019-10-17 NOTE — Progress Notes (Signed)
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  A CT of the chest, abdomen, pelvis without contrast was performed on 06/27/2019 which showed a spiculated right middle lobe pulmonary nodule measuring 11 x 11 x 10 mm.  This had been present on a CT scan performed on 03/01/2018.  There was no evidence of metastatic disease or primary malignancy in the chest, abdomen, pelvis.  Additionally, the patient had a exophytic 56m nodule from the thyroid isthmus.  The patient underwent a left frontal craniotomy for tumor resection on 06/29/2019.  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  female Asian patient metastatic adeno ca of lung  [EGFR mutated] is here for a follow up-currently on osimertinib 40 mg a day is here for follow-up.  Patient has been on osimertinib for the last 6 weeks.  Complains of dizziness.  Complains of intermittent nausea.  No diarrhea.  Complains of difficulty sleeping needing to take trazodone.  Denies any seizures.   Gaining weight.  No fevers or chills.  Review of Systems  Constitutional: Positive for malaise/fatigue. Negative for chills, diaphoresis and fever.  HENT: Negative for nosebleeds and sore throat.   Eyes: Negative for double vision.  Respiratory: Negative for cough, hemoptysis, sputum production and wheezing.   Cardiovascular: Negative for chest pain, palpitations, orthopnea and leg swelling.  Gastrointestinal: Positive for nausea. Negative for abdominal pain, blood in stool, constipation, diarrhea, heartburn, melena and vomiting.  Genitourinary: Negative for dysuria, frequency and urgency.  Musculoskeletal: Negative for back pain and joint pain.  Skin: Negative.  Negative for itching and rash.  Neurological: Positive for dizziness. Negative for tingling, focal weakness, weakness and headaches.  Endo/Heme/Allergies: Does not bruise/bleed easily.  Psychiatric/Behavioral: Negative for depression. The patient is not nervous/anxious and does not have insomnia.      MEDICAL HISTORY:  Past Medical History:  Diagnosis Date  . Anxiety   . Hypertension   . Lung cancer ([facility]    Right Middle Lobe    SURGICAL HISTORY: Past Surgical History:  Procedure Laterality Date  . APPLICATION OF CRANIAL NAVIGATION N/A 06/29/2019   Procedure: APPLICATION OF CRANIAL NAVIGATION;  Surgeon: OJudith Part MD;  [facility]  Service: Neurosurgery;  Laterality: N/A;  . CRANIOTOMY Left 06/29/2019   Procedure: LEFT CRANIOTOMY FOR TUMOR EXCISION;  Surgeon: OJudith Part MD;  [place] [facility]  Service: Neurosurgery;  Laterality: Left;    SOCIAL HISTORY: Social History   Socioeconomic History  . Marital status: Married    Spouse name: Not on  file  . Number of children: Not on file  . Years of education: Not on file  . Highest education level: Not on file  Occupational History  . Not on file  Tobacco Use  . Smoking status: Never Smoker  . Smokeless tobacco: Never Used  Vaping Use  . Vaping  Use: Never used  Substance and Sexual Activity  . Alcohol use: Never  . Drug use: Never  . Sexual activity: Not Currently    Birth control/protection: Post-menopausal  Other Topics Concern  . 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  She lives with her children in [place].   Social Determinants of Health   Financial Resource Strain:   . Difficulty of Paying Living Expenses: Not on file  Food Insecurity:   . Worried About Charity fundraiser in the Last Year: Not on file  . Ran Out of Food in the Last Year: Not on file  Transportation Needs:   . Lack of Transportation (Medical): Not on file  . Lack of Transportation (Non-Medical): Not on file  Physical Activity:   . Days of Exercise per Week: Not on file  . Minutes of Exercise per Session: Not on file  Stress:   . Feeling of Stress : Not on file  Social Connections:   . Frequency of Communication with Friends and Family: Not on file  . Frequency of Social Gatherings with Friends and Family: Not on file  . Attends Religious Services: Not on file  . Active Member of Clubs or Organizations: Not on file  . Attends Archivist Meetings: Not on file  . Marital Status: Not on file  Intimate Partner Violence:   . Fear of Current or Ex-Partner: Not on file  . Emotionally Abused: Not on file  . Physically Abused: Not on file  . Sexually Abused: Not on file    FAMILY HISTORY: Family History  Problem Relation Age of Onset  . Cancer Son        some type of cancer involving the brain    ALLERGIES:  is allergic to lisinopril.  MEDICATIONS:  Current Outpatient Medications  Medication Sig Dispense Refill  . acetaminophen (TYLENOL) 325 MG tablet Take 2 tablets (650 mg total) by mouth every 4 (four) hours as needed for mild pain (temp > 101.5).    Marland Kitchen levETIRAcetam (KEPPRA) 1000 MG tablet Take 1 tablet (1,000 mg total) by mouth 2 (two) times daily. 60  tablet 2  . LORazepam (ATIVAN) 0.5 MG tablet Take 1/2 tablet po 30 minutes prior to brain radiation or brain MRI scans. Please write instructions in patients native language: Santiago Glad (if possible) 20 tablet 0  . omeprazole (PRILOSEC) 20 MG capsule Take 1 capsule (20 mg total) by mouth daily. 30 capsule 0  . ondansetron (ZOFRAN) 8 MG tablet Take 1 tablet (8 mg total) by mouth every 8 (eight) hours as needed for nausea or vomiting. 20 tablet 3  . osimertinib mesylate (TAGRISSO) 40 MG tablet Take 2 tablets (80 mg total) by mouth daily. 60 tablet 3  . traZODone (DESYREL) 50 MG tablet Take 2 tablets (100 mg total) by mouth at bedtime. 30 tablet 0  . docusate sodium (COLACE) 100 MG capsule Take 1 capsule (100 mg total) by mouth 2 (two) times daily. (Patient not taking: Reported on 09/15/2019) 10 capsule 0  . pravastatin (PRAVACHOL) 20 MG tablet Take 1 tablet (20 mg total) by  mouth every evening. (Patient not taking: Reported on 09/15/2019) 30 tablet 0   No current facility-administered medications for this visit.      Marland Kitchen  PHYSICAL EXAMINATION: ECOG PERFORMANCE STATUS: 2 - Symptomatic, <50% confined to bed  Vitals:   10/17/19 1040  BP: (!) 134/57  Pulse: 87  Resp: 16  Temp: 98 F (36.7 C)  SpO2: 99%   Filed Weights   10/17/19 1040  Weight: 119 lb (54 kg)    Physical Exam Constitutional:      Comments: Thin built  Asian woman; ambulating independently accompanied by son, Laverna Peace.  HENT:     Head: Normocephalic and atraumatic.     Mouth/Throat:     Pharynx: No oropharyngeal exudate.  Eyes:     Pupils: Pupils are equal, round, and reactive to light.  Cardiovascular:     Rate and Rhythm: Normal rate and regular rhythm.  Pulmonary:     Effort: Pulmonary effort is normal. No respiratory distress.     Breath sounds: Normal breath sounds. No wheezing.  Abdominal:     General: Bowel sounds are normal. There is no distension.     Palpations: Abdomen is soft. There is no mass.     Tenderness:  There is no abdominal tenderness. There is no guarding or rebound.  Musculoskeletal:        General: No tenderness. Normal range of motion.     Cervical back: Normal range of motion and neck supple.  Skin:    General: Skin is warm.  Neurological:     Mental Status: She is alert and oriented to person, place, and time.  Psychiatric:        Mood and Affect: Affect normal.      LABORATORY DATA:  I have reviewed the data as listed Lab Results  Component Value Date   WBC 5.0 10/17/2019   HGB 11.1 (L) 10/17/2019   HCT 33.1 (L) 10/17/2019   MCV 90.9 10/17/2019   PLT 155 10/17/2019   Recent Labs    09/01/19 0934 09/15/19 1339 10/17/19 1021  NA 143 139 141  K 3.6 4.1 4.2  CL 106 106 105  CO2 27 28 28   GLUCOSE 114* 108* 97  BUN 8 10 13   CREATININE 0.57 0.74 0.86  CALCIUM 9.2 8.9 9.1  GFRNONAA >60 >60 >60  GFRAA >60 >60 >60  PROT 7.2 7.4 7.6  ALBUMIN 4.2 4.3 4.4  AST 15 16 22   ALT 12 14 13   ALKPHOS 62 61 48  BILITOT 0.7 0.9 0.6    RADIOGRAPHIC STUDIES: I have personally reviewed the radiological images as listed and agreed with the findings in the report. No results found.  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 Currently on Osimertinib 40 mg/day; tolerating with moderate side effects-[see below ] nausea/dizziness.   # HOLD osimertinib continue at 40 mg/day [sec to dizziness/nausea]; awaiting MRI brain with rad-Onc on sep 16th.  Reminded the son is a follow-up appointment also.  Recommend restaging CT of the chest.  # Nausea-grade 1-2 ; sec to Osimertinib; hold osimertinib.  Continue Zofran as needed.  # Dizzyness-question second osimertinib versus Keppra.  Stopped both medications.  Await MRI brain.  No orthostasis.  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  # DISPOSITION: # follow up in 2 weeks- MD: labs- cbc/cmp/CT chest  prior- -Dr.B  All questions were answered. The patient knows to call the clinic with any problems, questions or concerns.   Cammie Sickle, MD 10/17/2019 11:35 AM

## 2019-10-17 NOTE — Assessment & Plan Note (Addendum)
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 Currently on Osimertinib 40 mg/day; tolerating with moderate side effects-[see below ] nausea/dizziness.   # HOLD osimertinib continue at 40 mg/day [sec to dizziness/nausea]; awaiting MRI brain with rad-Onc on sep 16th.  Reminded the son is a follow-up appointment also.  Recommend restaging CT of the chest.  # Nausea-grade 1-2 ; sec to Osimertinib; hold osimertinib.  Continue Zofran as needed.  # Dizzyness-question second osimertinib versus Keppra.  Stopped both medications.  Await MRI brain.  No orthostasis.  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  # DISPOSITION: # follow up in 2 weeks- MD: labs- cbc/cmp/CT chest prior- -Dr.B

## 2019-10-18 NOTE — Progress Notes (Signed)
[place]  Telephone:([phone] [phone]  Patient Care Team: Services, [facility] as PCP - General Telford Nab, RN as Oncology Nurse Navigator   Name of the patient: Sara Schroeder  [id number]  1950/03/02   Date of visit: 10/18/19  HPI: Patient is a 69 y.o. female with metastatic lung adenocarcinoma, EGFR exon 19 deletion positive. Currently treated with Tagrisso (osimertinib). Patient started osimertinib 33m on 09/01/19, with the plan to increasethe dose as tolerated.   Reason for Consult: Oral chemotherapy follow-up for osimertinib therapy.   PAST MEDICAL HISTORY: Past Medical History:  Diagnosis Date  . Anxiety   . Hypertension   . Lung cancer ([facility]    Right Middle Lobe    PAST SURGICAL HISTORY:  Past Surgical History:  Procedure Laterality Date  . APPLICATION OF CRANIAL NAVIGATION N/A 06/29/2019   Procedure: APPLICATION OF CRANIAL NAVIGATION;  Surgeon: OJudith Part MD;  [place] [facility]  Service: Neurosurgery;  Laterality: N/A;  . CRANIOTOMY Left 06/29/2019   Procedure: LEFT CRANIOTOMY FOR TUMOR EXCISION;  Surgeon: OJudith Part MD;  [facility]  Service: Neurosurgery;  Laterality: Left;    HEMATOLOGY/ONCOLOGY HISTORY:  Oncology History Overview Note  # MAY 2021- [[place]; Dr.Ennver/Mohmad]- MRI of the brain-2.6 cm left lateral frontal mass compatible with hemorrhagic neoplasm.  A CT of the chest, abdomen, pelvis without contrast was performed on 06/27/2019 which showed a spiculated right middle lobe pulmonary nodule measuring 11 x 11 x 10 mm.  This had been present on a CT scan performed on 03/01/2018.  There was no evidence of metastatic disease or primary malignancy in the chest, abdomen, pelvis.  Additionally, the patient had a exophytic 17mnodule from the thyroid isthmus.  The patient underwent a left frontal craniotomy for tumor resection on 06/29/2019.  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  MEDICATIONS:  Current Outpatient Medications  Medication Sig Dispense Refill  . acetaminophen (TYLENOL) 325 MG tablet Take 2 tablets (650 mg total) by mouth every 4 (four) hours as needed for mild pain (temp > 101.5).    . Marland Kitchenocusate sodium (COLACE) 100 MG capsule Take 1 capsule (100 mg total) by mouth 2 (two) times daily. (Patient not taking: Reported on 09/15/2019) 10 capsule 0  . levETIRAcetam (KEPPRA) 1000 MG tablet Take 1 tablet (1,000 mg total) by mouth 2 (two) times daily. 60 tablet 2  . LORazepam (ATIVAN) 0.5 MG tablet Take 1/2 tablet po 30 minutes prior to brain radiation or brain MRI scans. Please write instructions in patients native language: KaSantiago Gladif possible) 20 tablet 0  . omeprazole (PRILOSEC) 20 MG capsule Take 1 capsule (20 mg total) by mouth daily. 30 capsule 0  . ondansetron (ZOFRAN) 8 MG tablet Take 1 tablet (8 mg total) by mouth every 8 (eight) hours as needed for nausea or vomiting. 20 tablet 3  .  osimertinib mesylate (TAGRISSO) 40 MG tablet Take 2 tablets (80 mg total) by mouth daily. 60 tablet 3  . pravastatin (PRAVACHOL) 20 MG tablet Take 1 tablet (20 mg total) by mouth every evening. (Patient not taking: Reported on 09/15/2019) 30 tablet 0  . traZODone (DESYREL) 50 MG tablet Take 2 tablets (100 mg total) by mouth at bedtime. 30 tablet 0   No current facility-administered  medications for this visit.    VITAL SIGNS: There were no vitals taken for this visit. There were no vitals filed for this visit.  Estimated body mass index is 21.77 kg/m as calculated from the following:   Height as of an earlier encounter on 10/17/19: 5' 2"  (1.575 m).   Weight as of an earlier encounter on 10/17/19: 54 kg (119 lb).  LABS: CBC:    Component Value Date/Time   WBC 5.0 10/17/2019 1021   HGB 11.1 (L) 10/17/2019 1021   HCT 33.1 (L) 10/17/2019 1021   PLT 155 10/17/2019 1021   MCV 90.9 10/17/2019 1021   NEUTROABS 3.1 10/17/2019 1021   LYMPHSABS 1.3 10/17/2019 1021   MONOABS 0.4 10/17/2019 1021   EOSABS 0.2 10/17/2019 1021   BASOSABS 0.1 10/17/2019 1021   Comprehensive Metabolic Panel:    Component Value Date/Time   NA 141 10/17/2019 1021   K 4.2 10/17/2019 1021   CL 105 10/17/2019 1021   CO2 28 10/17/2019 1021   BUN 13 10/17/2019 1021   CREATININE 0.86 10/17/2019 1021   GLUCOSE 97 10/17/2019 1021   CALCIUM 9.1 10/17/2019 1021   AST 22 10/17/2019 1021   ALT 13 10/17/2019 1021   ALKPHOS 48 10/17/2019 1021   BILITOT 0.6 10/17/2019 1021   PROT 7.6 10/17/2019 1021   ALBUMIN 4.4 10/17/2019 1021    RADIOGRAPHIC STUDIES: No results found.   Assessment and Plan-  MD holding osimertinib for now due to reported nausea and dizziness. Patient will return to clinic in 2 weeks for re-evaluation   Oral Chemotherapy Side Effect/Intolerance: see above  Oral Chemotherapy Adherence: reported no missed doses  Medication Access Issues: Patient has her medication with her in clinic. [facility] was due to call the patient for medication refill this week, medication refill push out one month due to osimertinib hold and medication on hand.   Patient expressed understanding and was in agreement with this plan. She also understands that She can call clinic at any time with any questions, concerns, or complaints.   Thank you for allowing me to participate in the care of this very  pleasant patient.   Time Total: 10 mins  Visit consisted of counseling and education on dealing with issues of symptom management in the setting of serious and potentially life-threatening illness.Greater than 50%  of this time was spent counseling and coordinating care related to the above assessment and plan.  Signed by: Darl Pikes, PharmD, BCPS, Salley Slaughter, CPP Hematology/Oncology Clinical Pharmacist Practitioner [facility]/HP/AP [facility] [phone]  10/18/2019 9:18 AM

## 2019-10-19 ENCOUNTER — Ambulatory Visit: Payer: Medicare Other | Admitting: Pharmacist

## 2019-10-31 ENCOUNTER — Telehealth: Payer: Self-pay | Admitting: *Deleted

## 2019-10-31 ENCOUNTER — Ambulatory Visit: Admission: RE | Admit: 2019-10-31 | Payer: Medicare Other | Source: Ambulatory Visit

## 2019-10-31 ENCOUNTER — Encounter: Payer: Self-pay | Admitting: Internal Medicine

## 2019-10-31 NOTE — Telephone Encounter (Signed)
Dr. Rogue Bussing - would you like to consult palliative care?

## 2019-10-31 NOTE — Telephone Encounter (Signed)
Son called to report that his mother was not ready to come back for a lab draw and there fore was there any reason to come to the MD appointment. He states that his mother suffered from anxiety and insomnia for a week after her last lab draw and that she is adamant about not having another. He also said that he knows she can not be treated properly without the lab work.

## 2019-11-01 ENCOUNTER — Telehealth: Payer: Self-pay | Admitting: Internal Medicine

## 2019-11-01 NOTE — Telephone Encounter (Signed)
Spoke to patient's son regarding his mother's reluctance to come to the cancer center for follow-ups/labs/imaging given extreme anxiety.  Discussed our inability to monitor disease/monitor side effects without close surveillance.  Recommend follow-up as planned this week; and will decide on future follow-ups.  C-cancel labs for this week; only MD follow-up; add palliative care/Josh appointment.  GB

## 2019-11-03 ENCOUNTER — Inpatient Hospital Stay: Payer: Medicare Other | Attending: Internal Medicine | Admitting: Internal Medicine

## 2019-11-03 ENCOUNTER — Other Ambulatory Visit: Payer: Self-pay

## 2019-11-03 ENCOUNTER — Inpatient Hospital Stay (HOSPITAL_BASED_OUTPATIENT_CLINIC_OR_DEPARTMENT_OTHER): Payer: Medicare Other | Admitting: Hospice and Palliative Medicine

## 2019-11-03 ENCOUNTER — Inpatient Hospital Stay: Payer: Medicare Other

## 2019-11-03 ENCOUNTER — Encounter: Payer: Self-pay | Admitting: Internal Medicine

## 2019-11-03 DIAGNOSIS — Z79899 Other long term (current) drug therapy: Secondary | ICD-10-CM | POA: Diagnosis not present

## 2019-11-03 DIAGNOSIS — R42 Dizziness and giddiness: Secondary | ICD-10-CM | POA: Diagnosis not present

## 2019-11-03 DIAGNOSIS — C7931 Secondary malignant neoplasm of brain: Secondary | ICD-10-CM | POA: Insufficient documentation

## 2019-11-03 DIAGNOSIS — F32A Depression, unspecified: Secondary | ICD-10-CM

## 2019-11-03 DIAGNOSIS — R110 Nausea: Secondary | ICD-10-CM | POA: Insufficient documentation

## 2019-11-03 DIAGNOSIS — F329 Major depressive disorder, single episode, unspecified: Secondary | ICD-10-CM | POA: Diagnosis not present

## 2019-11-03 DIAGNOSIS — F419 Anxiety disorder, unspecified: Secondary | ICD-10-CM | POA: Insufficient documentation

## 2019-11-03 DIAGNOSIS — C342 Malignant neoplasm of middle lobe, bronchus or lung: Secondary | ICD-10-CM | POA: Insufficient documentation

## 2019-11-03 DIAGNOSIS — I10 Essential (primary) hypertension: Secondary | ICD-10-CM | POA: Insufficient documentation

## 2019-11-03 DIAGNOSIS — Z515 Encounter for palliative care: Secondary | ICD-10-CM

## 2019-11-03 MED ORDER — FLUOXETINE HCL 20 MG PO CAPS
20.0000 mg | ORAL_CAPSULE | Freq: Every day | ORAL | 3 refills | Status: DC
Start: 2019-11-03 — End: 2020-08-15

## 2019-11-03 NOTE — Assessment & Plan Note (Addendum)
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 Currently Osimertinib 40 mg/day on hold secondary to moderate side effects  nausea/dizziness.   # Proceed with Osimertinib 40 mg/day [sec to poor tolerance/pts reluctance]; await CT scan chest 9/16.   #Brain metastases-status post resection followed by radiation.  Patient declines MRI.  Defer to radiation oncology-question CT scan with contrast.  # Nausea-grade 1-2 ; sec to Osimertinib; hold osimertinib.  Continue Zofran as needed.  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 Pt is interested.  # # Palliative care evaluation: Introduced palliative care philosophy and services. I discussed the need for palliative care evaluation/symptom management to help quality of life in the context of incurable disease.  Patient is interested; will make referral.  Discussed with Josh.  #Surveillance/follow-up-patient is not interested in close follow-up.  Patient complains of severe anxiety issues.  Await evaluation with Josh.   # DISPOSITION: # CT chest as planned  # follow up in 2 month - MD: labs- cbc/cmp; -Dr.B

## 2019-11-03 NOTE — Progress Notes (Signed)
[place]  [phone] [phone]   Name: Sara Schroeder Date: 11/03/2019 MRN: [id number]  DOB: 1950-07-05  Patient Care Team: Services, [place] as PCP - General [place], [place], RN as Oncology Nurse Navigator    REASON FOR CONSULTATION: Sara Schroeder is a 69 y.o. 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  Patient was on osimertinib but this was held due to dizziness and nausea.  Plan was for follow-up imaging but patient has declined visits to the cancer center.  Palliative care was consulted over address goals.  SOCIAL HISTORY:     reports that she has never smoked. She has never used smokeless tobacco. She reports that she does not drink alcohol and does not use drugs.  Patient is married and lives at home with their daughter.  They also have 4 sons who live nearby.  Patient is originally from [place] but immigrated to the [place] as a refugee.  ADVANCE DIRECTIVES:  Does not have  CODE STATUS:   PAST MEDICAL HISTORY: Past Medical History:  Diagnosis Date  . Anxiety   . Hypertension   . Lung cancer ([facility])    Right Middle Lobe    PAST SURGICAL HISTORY:  Past Surgical History:  Procedure Laterality Date  . APPLICATION OF CRANIAL NAVIGATION N/A 06/29/2019   Procedure: APPLICATION OF CRANIAL NAVIGATION;  Surgeon: Judith Part, MD;  Location: [facility];  Service: Neurosurgery;  Laterality: N/A;  . CRANIOTOMY Left 06/29/2019   Procedure: LEFT CRANIOTOMY FOR TUMOR EXCISION;  Surgeon: Judith Part, MD;  Location: [facility];  Service: Neurosurgery;  Laterality: Left;    HEMATOLOGY/ONCOLOGY HISTORY:  Oncology History Overview Note  # MAY 2021- [[place]; Dr.Ennver/Mohmad]- MRI of the brain-2.6 cm left lateral frontal mass compatible with hemorrhagic neoplasm.  A CT of the chest, abdomen, pelvis without contrast  was performed on 06/27/2019 which showed a spiculated right middle lobe pulmonary nodule measuring 11 x 11 x 10 mm.  This had been present on a CT scan performed on 03/01/2018.  There was no evidence of metastatic disease or primary malignancy in the chest, abdomen, pelvis.  Additionally, the patient had a exophytic 74m nodule from the thyroid isthmus.  The patient underwent a left frontal craniotomy for tumor resection on 06/29/2019.  Pathology shows metastatic adenocarcinoma- Given the history and this profile, the  findings are consistent with metastatic lung adenocarcinoma  #Brain SBRT; right middle lobe lung SBRT [[place]]  #July 1 week 2021-Osimertinib  # NGS/MOLECULAR TESTS: EGFR mutation exon 19;   # PALLIATIVE CARE EVALUATION:P  # PAIN MANAGEMENT: NA   DIAGNOSIS: lung cancer  STAGE:   IV      ;  GOALS:palliative  CURRENT/MOST RECENT THERAPY : osimertinib    Primary adenocarcinoma of middle lobe of right lung ([facility]  07/06/2019 Initial Diagnosis   Primary adenocarcinoma of middle lobe of right lung ([facility])     ALLERGIES:  is allergic to lisinopril.  MEDICATIONS:  Current Outpatient Medications  Medication Sig Dispense Refill  . acetaminophen (TYLENOL) 325 MG tablet Take 2 tablets (650 mg total) by mouth every 4 (four) hours as needed for mild pain (temp > 101.5). (Patient not taking: Reported on 11/03/2019)    . docusate sodium (COLACE) 100 MG capsule Take 1 capsule (100 mg total) by mouth 2 (two) times daily. (Patient not taking: Reported on 09/15/2019) 10 capsule 0  .  levETIRAcetam (KEPPRA) 1000 MG tablet Take 1 tablet (1,000 mg total) by mouth 2 (two) times daily. (Patient not taking: Reported on 11/03/2019) 60 tablet 2  . LORazepam (ATIVAN) 0.5 MG tablet Take 1/2 tablet po 30 minutes prior to brain radiation or brain MRI scans. Please write instructions in patients native language: Santiago Glad (if possible) (Patient not taking: Reported on 11/03/2019) 20 tablet 0  . Multiple  Vitamins-Minerals (CENTRUM SILVER 50+WOMEN PO) Take by mouth.    Marland Kitchen omeprazole (PRILOSEC) 20 MG capsule Take 1 capsule (20 mg total) by mouth daily. 30 capsule 0  . ondansetron (ZOFRAN) 8 MG tablet Take 1 tablet (8 mg total) by mouth every 8 (eight) hours as needed for nausea or vomiting. (Patient not taking: Reported on 11/03/2019) 20 tablet 3  . osimertinib mesylate (TAGRISSO) 40 MG tablet Take 2 tablets (80 mg total) by mouth daily. (Patient not taking: Reported on 11/03/2019) 60 tablet 3  . pravastatin (PRAVACHOL) 20 MG tablet Take 1 tablet (20 mg total) by mouth every evening. (Patient not taking: Reported on 09/15/2019) 30 tablet 0  . traZODone (DESYREL) 50 MG tablet Take 2 tablets (100 mg total) by mouth at bedtime. 30 tablet 0   No current facility-administered medications for this visit.    VITAL SIGNS: There were no vitals taken for this visit. There were no vitals filed for this visit.  Estimated body mass index is 21.95 kg/m as calculated from the following:   Height as of an earlier encounter on 11/03/19: 5' 2"  (1.575 m).   Weight as of an earlier encounter on 11/03/19: 120 lb (54.4 kg).  LABS: CBC:    Component Value Date/Time   WBC 5.0 10/17/2019 1021   HGB 11.1 (L) 10/17/2019 1021   HCT 33.1 (L) 10/17/2019 1021   PLT 155 10/17/2019 1021   MCV 90.9 10/17/2019 1021   NEUTROABS 3.1 10/17/2019 1021   LYMPHSABS 1.3 10/17/2019 1021   MONOABS 0.4 10/17/2019 1021   EOSABS 0.2 10/17/2019 1021   BASOSABS 0.1 10/17/2019 1021   Comprehensive Metabolic Panel:    Component Value Date/Time   NA 141 10/17/2019 1021   K 4.2 10/17/2019 1021   CL 105 10/17/2019 1021   CO2 28 10/17/2019 1021   BUN 13 10/17/2019 1021   CREATININE 0.86 10/17/2019 1021   GLUCOSE 97 10/17/2019 1021   CALCIUM 9.1 10/17/2019 1021   AST 22 10/17/2019 1021   ALT 13 10/17/2019 1021   ALKPHOS 48 10/17/2019 1021   BILITOT 0.6 10/17/2019 1021   PROT 7.6 10/17/2019 1021   ALBUMIN 4.4 10/17/2019 1021     RADIOGRAPHIC STUDIES: No results found.  PERFORMANCE STATUS (ECOG) : 0 - Asymptomatic  Review of Systems Unless otherwise noted, a complete review of systems is negative.  Physical Exam General: NAD, thin Pulmonary: Unlabored Extremities: no edema, no joint deformities Skin: no rashes Neurological: Weakness but otherwise nonfocal  IMPRESSION: Patient and son met with Dr. Rogue Bussing today.  Patient has decided that she wants to resume osimertinib but is only in agreement to obtain certain imaging such as CT of the chest but does not want MRI of the brain.  She also wants to limit follow-up visits to every 2 months.  Son interpreted the visit for me.  Patient says that she has significant anxiety secondary to the war in [place] that resulted in her falling as a refugee.  Patient is currently taking trazodone at bedtime to help her sleep.  She finds that if she does not take  that then she lies awake and cannot stop thinking about distressing thoughts.  Patient says that she was started on a depression medication sometime ago but is no longer taking it.  I called her pharmacy ([facility]) and patient was prescribed fluoxetine 20 mg in July 2020 but never picked up refills.  Patient states that she tolerated the fluoxetine but just decided to stop taking it.  She says she is now in agreement with resuming that medication.  We had a long discussion about her need to take it for weeks before she receives benefit.  We discussed ACP documents and I reviewed with them a MOST form, which patient took home to discuss with her family.  PLAN: -Continue current scope of treatment -Start fluoxetine 20 mg daily -May continue trazodone 50 mg at bedtime as needed -ACP/MOST form reviewed -RTC in 2 months   Patient expressed understanding and was in agreement with this plan. She also understands that She can call the clinic at any time with any questions, concerns, or complaints.      Time Total: 30 minutes  Visit consisted of counseling and education dealing with the complex and emotionally intense issues of symptom management and palliative care in the setting of serious and potentially life-threatening illness.Greater than 50%  of this time was spent counseling and coordinating care related to the above assessment and plan.  Signed by: Altha Harm, PhD, NP-C

## 2019-11-03 NOTE — Patient Instructions (Signed)
#   keep CT chest apt as planned # Keep the appt with radiation doctor from [place] on 9/20 at 2:30/Telephone visit.

## 2019-11-03 NOTE — Progress Notes (Signed)
[facility] NOTE  Patient Care Team: Services, [place] as PCP - General Telf[place] Nab, [place] as Oncology Nurse Navigator  CHIEF COMPLAINTS/PURPOSE OF CONSULTATION: lung cancer  #  Oncology History Overview Note  # MAY 2021- [[place]; Dr.Ennver/Mohmad]- MRI of the brain-2.6 cm left lateral frontal mass compatible with hemorrhagic neoplasm.  A CT of the chest, abdomen, pelvis without contrast was performed on 06/27/2019 which showed a spiculated right middle lobe pulmonary nodule measuring 11 x 11 x 10 mm.  This had been present on a CT scan performed on 03/01/2018.  There was no evidence of metastatic disease or primary malignancy in the chest, abdomen, pelvis.  Additionally, the patient had a exophytic 30m nodule from the thyroid isthmus.  The patient underwent a left frontal craniotomy for tumor resection on 06/29/2019.  Pathology shows metastatic adenocarcinoma- Given the history and this profile, the  findings are consistent with metastatic lung adenocarcinoma  #Brain SBRT; right middle lobe lung SBRT [[facility]]  #July 1 week 2021-Osimertinib  # NGS/MOLECULAR TESTS: EGFR mutation exon 19;   # PALLIATIVE CARE EVALUATION:P  # PAIN MANAGEMENT: NA   DIAGNOSIS: lung cancer  STAGE:   IV      ;  GOALS:palliative  CURRENT/MOST RECENT THERAPY : osimertinib    Primary adenocarcinoma of middle lobe of right lung ([facility]  07/06/2019 Initial Diagnosis   Primary adenocarcinoma of middle lobe of right lung ([facility])      HISTORY OF PRESENTING ILLNESS: Interpreted by her son;  patient does not speak EPetersburg BoroughThu 69 y.o.  female Asian patient metastatic adeno ca of lung  [EGFR mutated] to the brain is here for a follow up.  Patient's osimertinib is on hold because of nausea and dizziness for the last 1 month.  Patient currently denies any worsening nausea or dizziness.  Appetite is good.  No weight loss.  No diarrhea.   Review of Systems  Constitutional: Positive for  malaise/fatigue. Negative for chills, diaphoresis and fever.  HENT: Negative for nosebleeds and sore throat.   Eyes: Negative for double vision.  Respiratory: Negative for cough, hemoptysis, sputum production and wheezing.   Cardiovascular: Negative for chest pain, palpitations, orthopnea and leg swelling.  Gastrointestinal: Positive for nausea. Negative for abdominal pain, blood in stool, constipation, diarrhea, heartburn, melena and vomiting.  Genitourinary: Negative for dysuria, frequency and urgency.  Musculoskeletal: Negative for back pain and joint pain.  Skin: Negative.  Negative for itching and rash.  Neurological: Positive for dizziness. Negative for tingling, focal weakness, weakness and headaches.  Endo/Heme/Allergies: Does not bruise/bleed easily.  Psychiatric/Behavioral: Negative for depression. The patient is not nervous/anxious and does not have insomnia.      MEDICAL HISTORY:  Past Medical History:  Diagnosis Date  . Anxiety   . Hypertension   . Lung cancer ([facility]    Right Middle Lobe    SURGICAL HISTORY: Past Surgical History:  Procedure Laterality Date  . APPLICATION OF CRANIAL NAVIGATION N/A 06/29/2019   Procedure: APPLICATION OF CRANIAL NAVIGATION;  Surgeon: OJudith Part MD;  [place] [facility]  Service: Neurosurgery;  Laterality: N/A;  . CRANIOTOMY Left 06/29/2019   Procedure: LEFT CRANIOTOMY FOR TUMOR EXCISION;  Surgeon: OJudith Part MD;  [place] [place]  Service: Neurosurgery;  Laterality: Left;    SOCIAL HISTORY: Social History   Socioeconomic History  . Marital status: Married    Spouse name: Not on file  . Number of children: Not on file  . Years of education:  Not on file  . Highest education level: Not on file  Occupational History  . Not on file  Tobacco Use  . Smoking status: Never Smoker  . Smokeless tobacco: Never Used  Vaping Use  . Vaping Use: Never used  Substance and Sexual Activity  . Alcohol use: Never  . Drug use: Never   . Sexual activity: Not Currently    Birth control/protection: Post-menopausal  Other Topics Concern  . 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  She lives with her children in [place].   Social Determinants of Health   Financial Resource Strain:   . Difficulty of Paying Living Expenses: Not on file  Food Insecurity:   . Worried About Charity fundraiser in the Last Year: Not on file  . Ran Out of Food in the Last Year: Not on file  Transportation Needs:   . Lack of Transportation (Medical): Not on file  . Lack of Transportation (Non-Medical): Not on file  Physical Activity:   . Days of Exercise per Week: Not on file  . Minutes of Exercise per Session: Not on file  Stress:   . Feeling of Stress : Not on file  Social Connections:   . Frequency of Communication with Friends and Family: Not on file  . Frequency of Social Gatherings with Friends and Family: Not on file  . Attends Religious Services: Not on file  . Active Member of Clubs or Organizations: Not on file  . Attends Archivist Meetings: Not on file  . Marital Status: Not on file  Intimate Partner Violence:   . Fear of Current or Ex-Partner: Not on file  . Emotionally Abused: Not on file  . Physically Abused: Not on file  . Sexually Abused: Not on file    FAMILY HISTORY: Family History  Problem Relation Age of Onset  . Cancer Son        some type of cancer involving the brain    ALLERGIES:  is allergic to lisinopril.  MEDICATIONS:  Current Outpatient Medications  Medication Sig Dispense Refill  . Multiple Vitamins-Minerals (CENTRUM SILVER 50+WOMEN PO) Take by mouth.    Marland Kitchen omeprazole (PRILOSEC) 20 MG capsule Take 1 capsule (20 mg total) by mouth daily. 30 capsule 0  . traZODone (DESYREL) 50 MG tablet Take 2 tablets (100 mg total) by mouth at bedtime. 30 tablet 0  . acetaminophen (TYLENOL) 325 MG tablet Take 2 tablets (650 mg total)  by mouth every 4 (four) hours as needed for mild pain (temp > 101.5). (Patient not taking: Reported on 11/03/2019)    . docusate sodium (COLACE) 100 MG capsule Take 1 capsule (100 mg total) by mouth 2 (two) times daily. (Patient not taking: Reported on 09/15/2019) 10 capsule 0  . FLUoxetine (PROZAC) 20 MG capsule Take 1 capsule (20 mg total) by mouth daily. 30 capsule 3  . levETIRAcetam (KEPPRA) 1000 MG tablet Take 1 tablet (1,000 mg total) by mouth 2 (two) times daily. (Patient not taking: Reported on 11/03/2019) 60 tablet 2  . LORazepam (ATIVAN) 0.5 MG tablet Take 1/2 tablet po 30 minutes prior to brain radiation or brain MRI scans. Please write instructions in patients native language: Santiago Glad (if possible) (Patient not taking: Reported on 11/03/2019) 20 tablet 0  . ondansetron (ZOFRAN) 8 MG tablet Take 1 tablet (8 mg total) by mouth every 8 (eight) hours as needed for nausea or vomiting. (Patient  not taking: Reported on 11/03/2019) 20 tablet 3  . osimertinib mesylate (TAGRISSO) 40 MG tablet Take 2 tablets (80 mg total) by mouth daily. (Patient not taking: Reported on 11/03/2019) 60 tablet 3  . pravastatin (PRAVACHOL) 20 MG tablet Take 1 tablet (20 mg total) by mouth every evening. (Patient not taking: Reported on 09/15/2019) 30 tablet 0   No current facility-administered medications for this visit.      Marland Kitchen  PHYSICAL EXAMINATION: ECOG PERFORMANCE STATUS: 2 - Symptomatic, <50% confined to bed  Vitals:   11/03/19 1412  BP: (!) 147/70  Pulse: 91  Resp: 16  Temp: 99 F (37.2 C)  SpO2: 100%   Filed Weights   11/03/19 1412  Weight: 120 lb (54.4 kg)    Physical Exam Constitutional:      Comments: Thin built  Asian woman; ambulating independently accompanied by son, Laverna Peace.  HENT:     Head: Normocephalic and atraumatic.     Mouth/Throat:     Pharynx: No oropharyngeal exudate.  Eyes:     Pupils: Pupils are equal, round, and reactive to light.  Cardiovascular:     Rate and Rhythm: Normal rate  and regular rhythm.  Pulmonary:     Effort: Pulmonary effort is normal. No respiratory distress.     Breath sounds: Normal breath sounds. No wheezing.  Abdominal:     General: Bowel sounds are normal. There is no distension.     Palpations: Abdomen is soft. There is no mass.     Tenderness: There is no abdominal tenderness. There is no guarding or rebound.  Musculoskeletal:        General: No tenderness. Normal range of motion.     Cervical back: Normal range of motion and neck supple.  Skin:    General: Skin is warm.  Neurological:     Mental Status: She is alert and oriented to person, place, and time.  Psychiatric:        Mood and Affect: Affect normal.      LABORATORY DATA:  I have reviewed the data as listed Lab Results  Component Value Date   WBC 5.0 10/17/2019   HGB 11.1 (L) 10/17/2019   HCT 33.1 (L) 10/17/2019   MCV 90.9 10/17/2019   PLT 155 10/17/2019   Recent Labs    09/01/19 0934 09/15/19 1339 10/17/19 1021  NA 143 139 141  K 3.6 4.1 4.2  CL 106 106 105  CO2 27 28 28   GLUCOSE 114* 108* 97  BUN 8 10 13   CREATININE 0.57 0.74 0.86  CALCIUM 9.2 8.9 9.1  GFRNONAA >60 >60 >60  GFRAA >60 >60 >60  PROT 7.2 7.4 7.6  ALBUMIN 4.2 4.3 4.4  AST 15 16 22   ALT 12 14 13   ALKPHOS 62 61 48  BILITOT 0.7 0.9 0.6    RADIOGRAPHIC STUDIES: I have personally reviewed the radiological images as listed and agreed with the findings in the report. No results found.  ASSESSMENT & PLAN:   Primary adenocarcinoma of middle lobe of right lung ([place]) #Oligometastatic EGFR MUTATED lung cancer-right middle lobe solitary primary; single brain met status post resection. Currently Osimertinib 40 mg/day on hold secondary to moderate side effects  nausea/dizziness.   # Proceed with Osimertinib 40 mg/day [sec to poor tolerance/pts reluctance]; await CT scan chest 9/16.   #Brain metastases-status post resection followed by radiation.  Patient declines MRI.  Defer to radiation  oncology-question CT scan with contrast.  # Nausea-grade 1-2 ; sec to Osimertinib; hold  osimertinib.  Continue Zofran as needed.  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 Pt is interested.  # # Palliative care evaluation: Introduced palliative care philosophy and services. I discussed the need for palliative care evaluation/symptom management to help quality of life in the context of incurable disease.  Patient is interested; will make referral.  Discussed with Josh.  #Surveillance/follow-up-patient is not interested in close follow-up.  Patient complains of severe anxiety issues.  Await evaluation with Josh.   # DISPOSITION: # CT chest as planned  # follow up in 2 month - MD: labs- cbc/cmp; -Dr.B  All questions were answered. The patient knows to call the clinic with any problems, questions or concerns.   Cammie Sickle, MD 11/07/2019 12:38 PM

## 2019-11-09 ENCOUNTER — Ambulatory Visit (HOSPITAL_COMMUNITY): Payer: Medicare Other

## 2019-11-09 ENCOUNTER — Ambulatory Visit
Admission: RE | Admit: 2019-11-09 | Discharge: 2019-11-09 | Disposition: A | Discharge status: Home / Self Care | Payer: Medicare Other | Source: Ambulatory Visit | Attending: Internal Medicine | Admitting: Internal Medicine

## 2019-11-09 ENCOUNTER — Other Ambulatory Visit: Payer: Self-pay

## 2019-11-09 DIAGNOSIS — C342 Malignant neoplasm of middle lobe, bronchus or lung: Secondary | ICD-10-CM | POA: Insufficient documentation

## 2019-11-09 IMAGING — CT CT CHEST W/ CM
2 of 4 series · 15 of 36 positions shown, 18 images · IV contrast (omnipaque)
Comparison: CT the chest, abdomen and pelvis [DATE].

CLINICAL DATA: 68-year-old female with history of non-small cell
lung cancer. Follow-up study.

EXAM:
CT CHEST WITH CONTRAST
TECHNIQUE: Multidetector CT imaging of the chest was performed during
intravenous contrast administration.
CONTRAST:  60mL OMNIPAQUE IOHEXOL 300 MG/ML  SOLN

[Series 2: axial chest 2.00 · axial · 0.64mm/px · z∈[+1687,+1923]mm · 12 of 140 slices shown, 15 images]
[im 11/140  mediastinal]
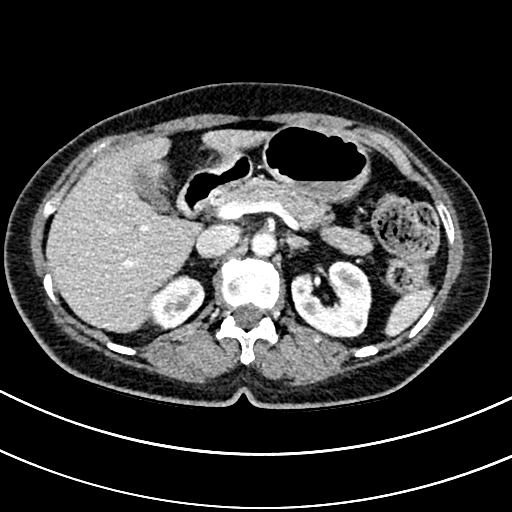
[im 11/140  lung]
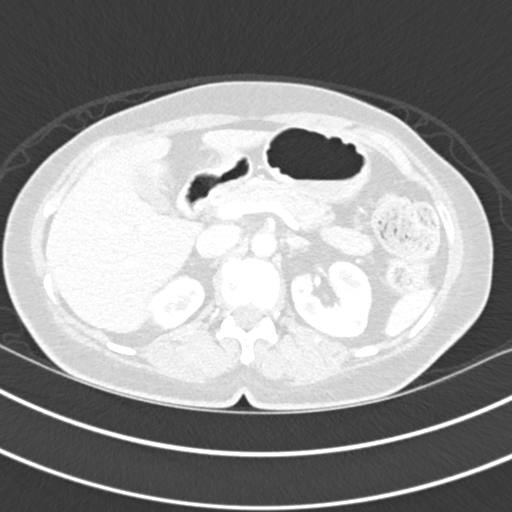
[im 22/140  lung]
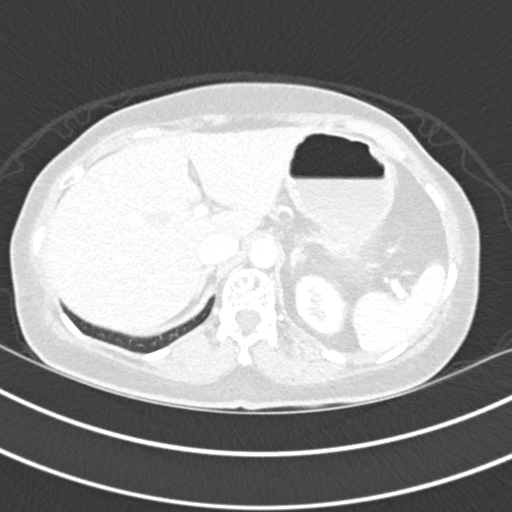
[im 33/140  lung]
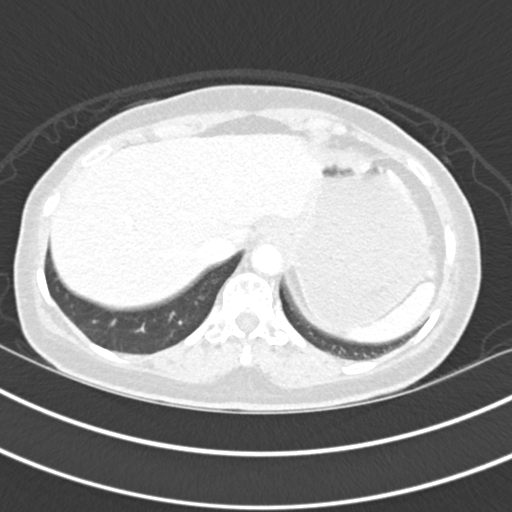
[im 43/140  lung]
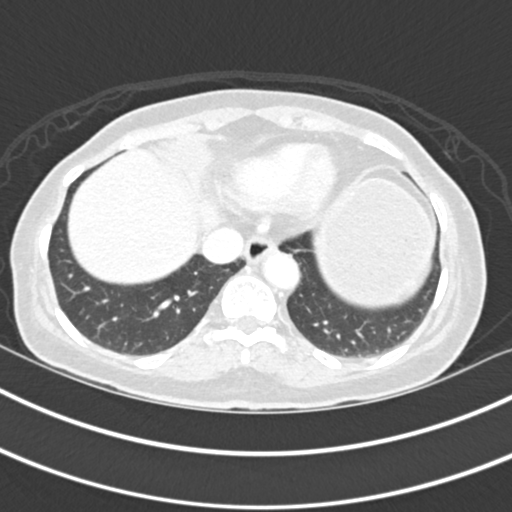
[im 54/140  mediastinal]
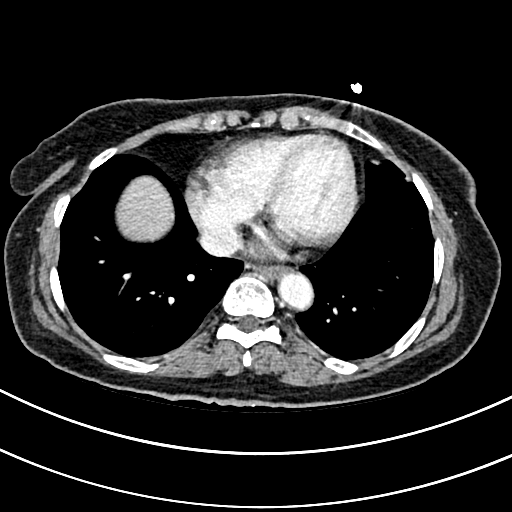
[im 54/140  lung]
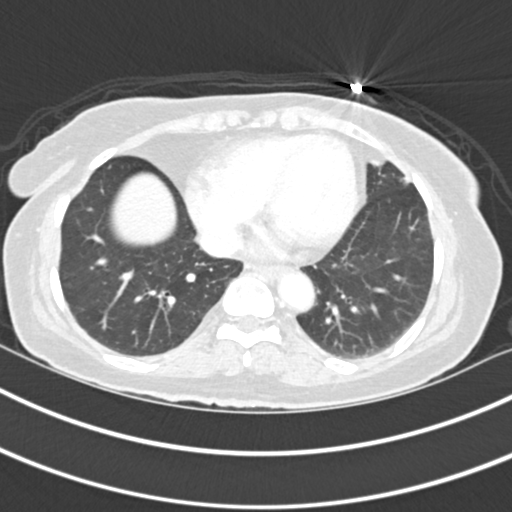
[im 65/140  lung]
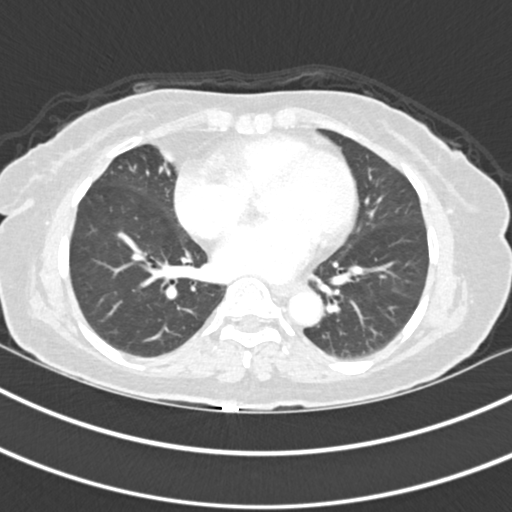
[im 75/140  lung]
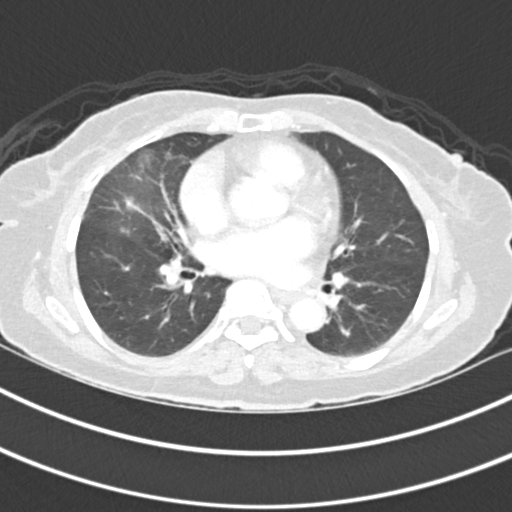
[im 86/140  lung]
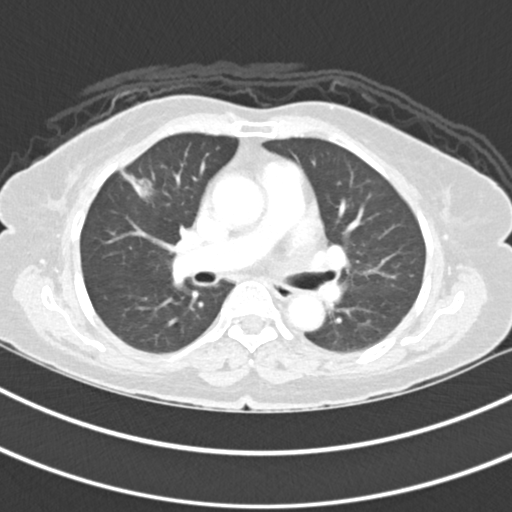
[im 97/140  mediastinal]
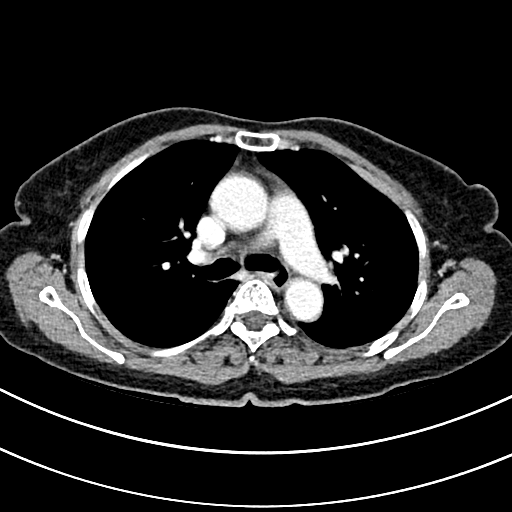
[im 97/140  lung]
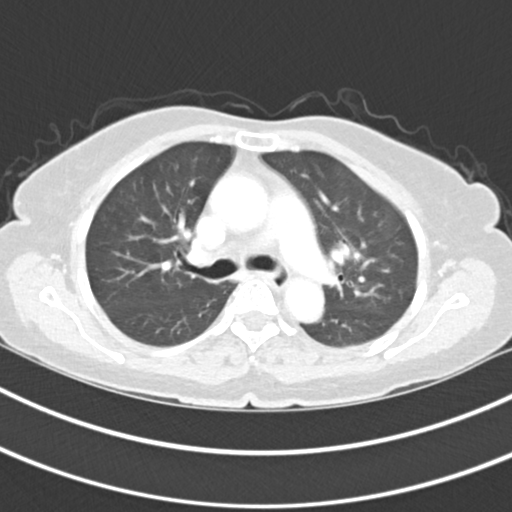
[im 107/140  lung]
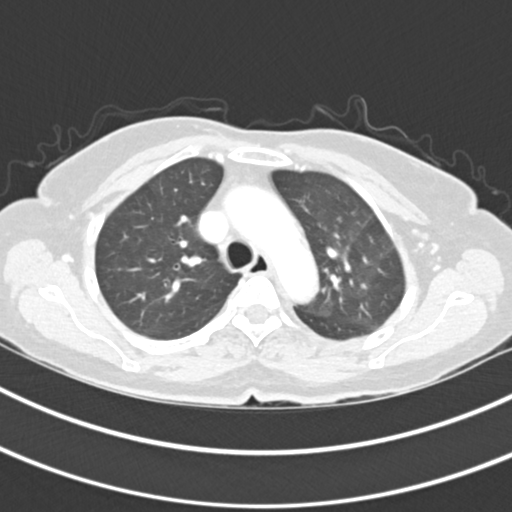
[im 118/140  lung]
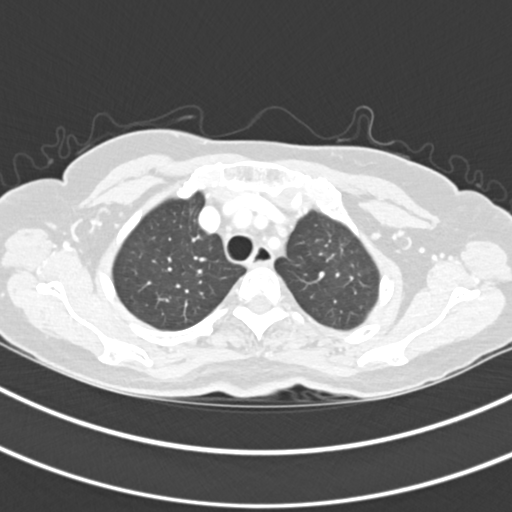
[im 129/140  lung]
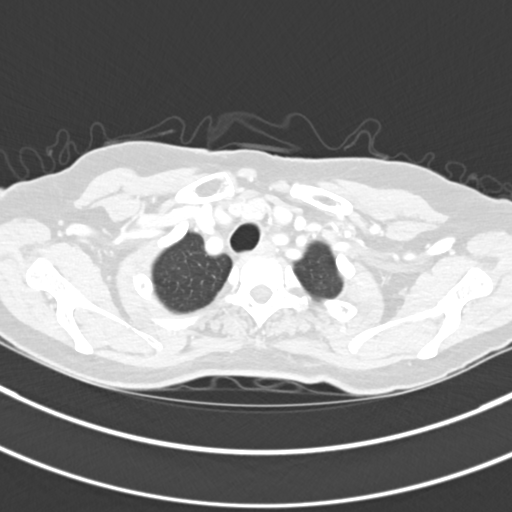

[Series 4: coronal chest 2.00 cor · coronal · 0.55mm/px · 3 of 118 slices shown]
[im 24/118  lung]
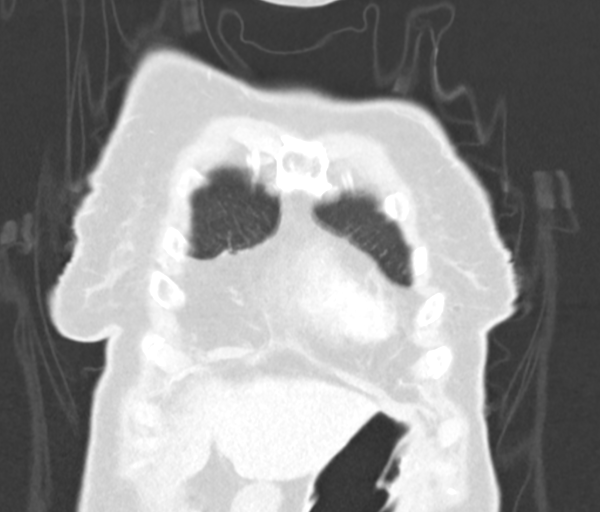
[im 47/118  lung]
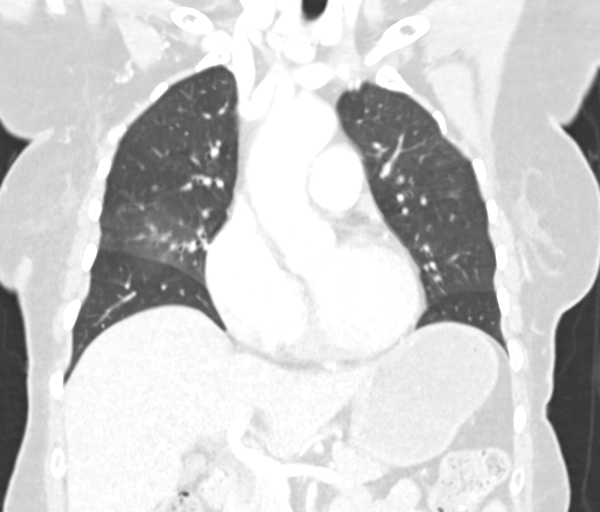
[im 71/118  lung]
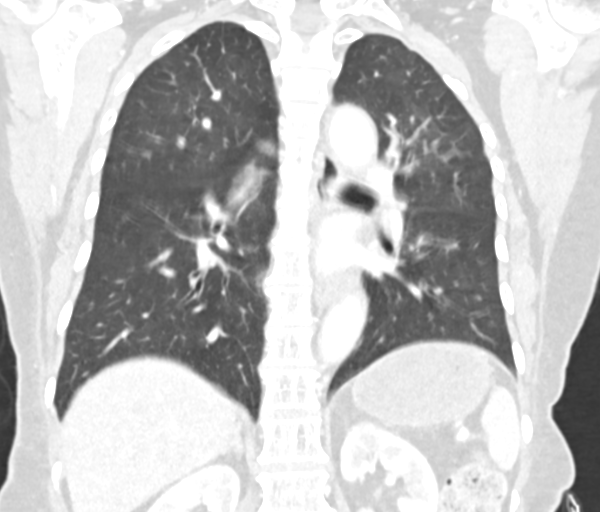

[15 of 36 positions shown; findings below may reference images not displayed]

FINDINGS: Cardiovascular: Heart size is normal. There is no significant
pericardial fluid, thickening or pericardial calcification. There is
aortic atherosclerosis, as well as atherosclerosis of the great
vessels of the mediastinum and the coronary arteries, including
calcified atherosclerotic plaque in the left anterior descending
coronary artery.

Mediastinum/Nodes: No pathologically enlarged mediastinal or hilar
lymph nodes. Esophagus is unremarkable in appearance. No axillary
lymphadenopathy.

Lungs/Pleura: Previously noted right middle lobe nodule is stable in
size, currently measuring 1.1 x 1.0 cm, but appears less bulky and
now demonstrates surrounding architectural distortion which likely
reflects evolving postradiation changes. This is relatively nodular
in appearance on some images measuring up to 2.6 x 1.1 cm in the
right upper lobe (axial image 56 of series 3). No other definite
suspicious appearing pulmonary nodules or masses are noted. No acute
consolidative airspace disease. No pleural effusions. Mild linear
scarring in the inferior segment of the lingula.

Upper Abdomen: Unremarkable.

Musculoskeletal: There are no aggressive appearing lytic or blastic
lesions noted in the visualized portions of the skeleton.
IMPRESSION: 1. Right middle lobe nodule is stable in size but appears less bulky
than the prior examination, with evolving post treatment related
changes in the right lung, as discussed above. Today's study will
serve as a new baseline for future follow-up examinations.
2. Aortic atherosclerosis, in addition to left anterior descending
coronary artery disease. Please note that although the presence of
coronary artery calcium documents the presence of coronary artery
disease, the severity of this disease and any potential stenosis
cannot be assessed on this non-gated CT examination. Assessment for
potential risk factor modification, dietary therapy or pharmacologic
therapy may be warranted, if clinically indicated.

Aortic Atherosclerosis ([4A]-[4A]).

## 2019-11-09 MED ORDER — IOHEXOL 300 MG/ML  SOLN
60.0000 mL | Freq: Once | INTRAMUSCULAR | Status: AC | PRN
  Administered 2019-11-09: 60 mL via INTRAVENOUS

## 2019-11-13 ENCOUNTER — Ambulatory Visit: Payer: Self-pay | Admitting: Radiation Oncology

## 2019-11-13 ENCOUNTER — Telehealth: Payer: Self-pay | Admitting: Internal Medicine

## 2019-11-13 NOTE — Telephone Encounter (Signed)
On 9/17-spoke to patient's son Day Day regarding the results of the CT scan chest-improving/stable lung nodule/cancer.  Continue current osimertinib.  Again reminded the patient's to follow-up with radiation oncology/at [facility].  Hildred Alamin, please follow-up with his son regarding appts. With rad-onc at [facility]. Looks like they might have missed recent appt.    Follow-up with me as planned. Thanks GB

## 2019-11-15 ENCOUNTER — Telehealth: Payer: Self-pay | Admitting: Radiation Therapy

## 2019-11-15 NOTE — Telephone Encounter (Signed)
I called the patient's son, Day, to see if his mother is willing to reschedule the brain MRI and follow-up with Rad Onc. He states that his mother has extreme anxiety when it comes to having any test or procedure. She will not be able to eat or sleep the week approaching the appointment and it takes her a long time to recover her strength after the exam has been completed. He understands that the MRI is necessary to see how she has responded to the radiation and also to evaluate if there is other disease in the brain that has not yet become symptomatic. I explained the advantage of treating an area when it is still small and how that can prevent her from having issues. He understands this rational, but in respect for his mother, he is honoring her decision to not have the MRI at this time. They plan to continue follow-up with Dr. Rogue Bussing and will reassess in the future if she changes her mind. I will share this with Dr. Lisbeth Renshaw, Shona Simpson PA-C and Dr. Rogue Bussing.   Mont Dutton R.T.(R)(T) Radiation Special Procedures Navigator

## 2019-11-15 NOTE — Telephone Encounter (Signed)
Sara Schroeder- do you mind checking to see if pt requires any follow up with radonc? If so, I will be happy to let pt's son know about the appt if you need me to.   Thanks!

## 2019-11-27 MED FILL — TAGRISSO 40 MG TABLET: 40 | 30 days supply | Qty: 60 | Fill #2

## 2019-12-25 MED FILL — TAGRISSO 40 MG TABLET: 40 | 30 days supply | Qty: 60 | Fill #3

## 2019-12-26 ENCOUNTER — Other Ambulatory Visit: Payer: Self-pay | Admitting: Pharmacist

## 2019-12-26 DIAGNOSIS — C342 Malignant neoplasm of middle lobe, bronchus or lung: Secondary | ICD-10-CM

## 2019-12-26 MED ORDER — OSIMERTINIB MESYLATE 40 MG PO TABS: 40.0000 mg | ORAL_TABLET | Freq: Every day | ORAL | 3 refills | Status: DC

## 2019-12-26 MED FILL — MAGIC MOUTHWASH (6 ING'S): 100000 | 12 days supply | Qty: 480 | Fill #0

## 2019-12-26 NOTE — Progress Notes (Signed)
Oral Chemotherapy Pharmacist Encounter   Received notification from [place] that Ms. Biela may be having some mouth irration. Called patient/son on 10/27 to check in and ask some additional questions. Son returned my call today 11/2. He started that his mom has been having some mouth sores for about a week now and they were interrupting her sleep. She has been able to still swallow her medication.   Patient is uninterested in coming into clinic any sooner than her next scheduled appt. Faxed in [place] prescription to [facility] for the patient.    Darl Pikes, PharmD, BCPS, BCOP, CPP Hematology/Oncology Clinical Pharmacist [facility]/AP [facility] [phone]  12/26/2019 4:22 PM

## 2020-01-03 ENCOUNTER — Inpatient Hospital Stay: Payer: Medicare Other | Attending: Internal Medicine

## 2020-01-03 ENCOUNTER — Inpatient Hospital Stay: Payer: Medicare Other | Admitting: Hospice and Palliative Medicine

## 2020-01-03 ENCOUNTER — Inpatient Hospital Stay: Payer: Medicare Other | Admitting: Internal Medicine

## 2020-01-09 DIAGNOSIS — C7931 Secondary malignant neoplasm of brain: Secondary | ICD-10-CM | POA: Diagnosis not present

## 2020-01-09 DIAGNOSIS — R2689 Other abnormalities of gait and mobility: Secondary | ICD-10-CM | POA: Diagnosis not present

## 2020-03-13 ENCOUNTER — Telehealth: Payer: Self-pay | Admitting: *Deleted

## 2020-03-13 NOTE — Telephone Encounter (Signed)
Received incoming fax from [facility] to request for transport chair. Patient refuses to follow-up with Dr. Rogue Bussing. md will not sign off on this DMA request form.

## 2020-03-14 MED FILL — TAGRISSO 40 MG TABLET: 40 | 30 days supply | Qty: 30 | Fill #0

## 2020-03-22 ENCOUNTER — Telehealth: Payer: Self-pay | Admitting: *Deleted

## 2020-03-22 NOTE — Telephone Encounter (Signed)
Received incoming fax from [facility] to request that Dr. Rogue Bussing reconsider signing CNM for transport chair. Patient received the chair in May 2021. CNM is needed for insurance claims so the company can obtain a PA to get paid for the past 6 months.  I spoke with Dr. Rogue Bussing after reviewing the patient's chart. MD will not sign this order. Our first appointment with the patient was on 08/11/2019. The transport chair was recommended by PT during patient's hospitalization in May 20221 at [facility]. Patient continues to refuse apts/follow-ups for her care at the [facility]. Dr. Rogue Bussing will not sign on this order for the reasons stated above. Message sent back on fax stating the information above.

## 2020-04-15 MED FILL — TAGRISSO 40 MG TABLET: 40 | 30 days supply | Qty: 30 | Fill #1

## 2020-05-24 ENCOUNTER — Other Ambulatory Visit (HOSPITAL_COMMUNITY): Payer: Self-pay

## 2020-06-20 ENCOUNTER — Other Ambulatory Visit (HOSPITAL_COMMUNITY): Payer: Self-pay

## 2020-06-21 ENCOUNTER — Other Ambulatory Visit (HOSPITAL_COMMUNITY): Payer: Self-pay

## 2020-06-24 ENCOUNTER — Other Ambulatory Visit (HOSPITAL_COMMUNITY): Payer: Self-pay

## 2020-06-24 MED FILL — Osimertinib Mesylate Tab 40 MG (Base Equivalent): ORAL | 30 days supply | Qty: 30 | Fill #0 | Status: AC

## 2020-06-26 ENCOUNTER — Other Ambulatory Visit (HOSPITAL_COMMUNITY): Payer: Self-pay

## 2020-07-15 ENCOUNTER — Telehealth: Payer: Self-pay | Admitting: *Deleted

## 2020-07-15 NOTE — Telephone Encounter (Signed)
Sara Schroeder- Please talk to son that pt needs follow up in clinic before ordering any scans.If interetsed in follow up- schedule in next 1-2 weeks; MD; labs- cbc/cmp   GB

## 2020-07-15 NOTE — Telephone Encounter (Signed)
Pt's son left message asking for pt to be scheduled for brain MRI to follow up.   Please advise.

## 2020-07-15 NOTE — Telephone Encounter (Signed)
Spoke with pt's son and he is agreeable to scheduling follow up appt but does not wish to schedule her for lab work since lab draws cause severe anxiety for the patient.   Colette- can you schedule pt to see Dr. B in the next 1-2 weeks and notify pt's son Day?

## 2020-07-16 NOTE — Telephone Encounter (Signed)
Appointment scheduled for 6/13

## 2020-07-23 ENCOUNTER — Other Ambulatory Visit (HOSPITAL_COMMUNITY): Payer: Self-pay

## 2020-07-23 MED FILL — Osimertinib Mesylate Tab 40 MG (Base Equivalent): ORAL | 30 days supply | Qty: 30 | Fill #1 | Status: AC

## 2020-07-24 ENCOUNTER — Other Ambulatory Visit (HOSPITAL_COMMUNITY): Payer: Self-pay

## 2020-07-29 ENCOUNTER — Other Ambulatory Visit (HOSPITAL_COMMUNITY): Payer: Self-pay

## 2020-08-05 ENCOUNTER — Inpatient Hospital Stay: Payer: Medicare Other | Admitting: Internal Medicine

## 2020-08-08 ENCOUNTER — Inpatient Hospital Stay: Payer: Medicare Other | Admitting: Internal Medicine

## 2020-08-15 ENCOUNTER — Inpatient Hospital Stay: Payer: Medicare Other

## 2020-08-15 ENCOUNTER — Other Ambulatory Visit: Payer: Self-pay

## 2020-08-15 ENCOUNTER — Inpatient Hospital Stay: Payer: Medicare Other | Attending: Internal Medicine | Admitting: Internal Medicine

## 2020-08-15 VITALS — BP 145/74 | HR 86 | Temp 97.5°F | Resp 18 | Ht 62.0 in | Wt 112.0 lb

## 2020-08-15 DIAGNOSIS — C7931 Secondary malignant neoplasm of brain: Secondary | ICD-10-CM

## 2020-08-15 DIAGNOSIS — C342 Malignant neoplasm of middle lobe, bronchus or lung: Secondary | ICD-10-CM

## 2020-08-15 DIAGNOSIS — I10 Essential (primary) hypertension: Secondary | ICD-10-CM | POA: Insufficient documentation

## 2020-08-15 DIAGNOSIS — Z79899 Other long term (current) drug therapy: Secondary | ICD-10-CM | POA: Diagnosis not present

## 2020-08-15 LAB — CBC WITH DIFFERENTIAL/PLATELET
Abs Immature Granulocytes: 0.03 10*3/uL (ref 0.00–0.07)
Basophils Absolute: 0 10*3/uL (ref 0.0–0.1)
Basophils Relative: 1 %
Eosinophils Absolute: 0.1 10*3/uL (ref 0.0–0.5)
Eosinophils Relative: 1 %
HCT: 36.8 % (ref 36.0–46.0)
Hemoglobin: 12.1 g/dL (ref 12.0–15.0)
Immature Granulocytes: 0 %
Lymphocytes Relative: 19 %
Lymphs Abs: 1.3 10*3/uL (ref 0.7–4.0)
MCH: 31 pg (ref 26.0–34.0)
MCHC: 32.9 g/dL (ref 30.0–36.0)
MCV: 94.4 fL (ref 80.0–100.0)
Monocytes Absolute: 0.4 10*3/uL (ref 0.1–1.0)
Monocytes Relative: 5 %
Neutro Abs: 5.2 10*3/uL (ref 1.7–7.7)
Neutrophils Relative %: 74 %
Platelets: 184 10*3/uL (ref 150–400)
RBC: 3.9 MIL/uL (ref 3.87–5.11)
RDW: 14 % (ref 11.5–15.5)
WBC: 7 10*3/uL (ref 4.0–10.5)
nRBC: 0 % (ref 0.0–0.2)

## 2020-08-15 LAB — COMPREHENSIVE METABOLIC PANEL
ALT: 18 U/L (ref 0–44)
AST: 22 U/L (ref 15–41)
Albumin: 4.8 g/dL (ref 3.5–5.0)
Alkaline Phosphatase: 76 U/L (ref 38–126)
Anion gap: 5 (ref 5–15)
BUN: 10 mg/dL (ref 8–23)
CO2: 30 mmol/L (ref 22–32)
Calcium: 9.1 mg/dL (ref 8.9–10.3)
Chloride: 105 mmol/L (ref 98–111)
Creatinine, Ser: 0.74 mg/dL (ref 0.44–1.00)
GFR, Estimated: >60 mL/min (ref >60)
Glucose, Bld: 124 mg/dL — ABNORMAL HIGH (ref 70–99)
Potassium: 3.8 mmol/L (ref 3.5–5.1)
Sodium: 140 mmol/L (ref 135–145)
Total Bilirubin: 1 mg/dL (ref 0.3–1.2)
Total Protein: 8.1 g/dL (ref 6.5–8.1)

## 2020-08-15 LAB — MAGNESIUM: Magnesium: 2.3 mg/dL (ref 1.7–2.4)

## 2020-08-15 NOTE — Assessment & Plan Note (Addendum)
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 Currently Osimertinib 40 mg/day [secondary to poor tolerance on 80 mg a day].  October 2021-CT chest-radiation changes noted in the right upper lobe; otherwise metastatic disease.  October MRI brain-treated brain metastases no evidence of any disease.  #Continues osimertinib 40 mg daily- see below.   #Recent episode of nausea/back pain fatigue/dyspepsia-unlikely related to osimertinib.  As patient's symptoms have resolved-patient continued monthly.  #Brain metastases-status post resection followed by SBRT.  Await MRI brain.  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  Patient off [place].  #Anxiety: Not taking her antidepressant.  See discussion below  #Noncompliance: With medications/follow-up visits.  Patient canceled appointments scheduled in November 2021.  Son states patient does not like to come into the cancer center or having lab draw or imaging because of anxiety.  Had a stress multiple times that these treatments are quite toxic if not well supervised every few months.  The patient's son questioned if the patient could come off osimertinib if imaging not show any evidence of malignancy.  I again had a stress with his son that is extremely important she continues to be on osimertinib given metastatic disease to the brain.  Also informed patient that if she does not keep her future appointments-her osimertinib will not be refilled.   # DISPOSITION: # MRI Brain; and CT chest non-contrast [same day]  # labs today- cbc/cmp/mag  # follow up in 3 month - MD: labs- cbc/cmp.mag; -Dr.B  # 40 minutes face-to-face with the patient discussing the above plan of care; more than 50% of time spent on prognosis/ natural history; counseling and coordination.

## 2020-08-15 NOTE — Progress Notes (Signed)
[facility] NOTE  Patient Care Team: Services, [place] as PCP - General Telford Nab, [place] as Oncology Nurse Navigator  CHIEF COMPLAINTS/PURPOSE OF CONSULTATION: lung cancer  #  Oncology History Overview Note  # MAY 2021- [[place]; Dr.Ennver/Mohmad]- MRI of the brain-2.6 cm left lateral frontal mass compatible with hemorrhagic neoplasm.  A CT of the chest, abdomen, pelvis without contrast was performed on 06/27/2019 which showed a spiculated right middle lobe pulmonary nodule measuring 11 x 11 x 10 mm.  This had been present on a CT scan performed on 03/01/2018.  There was no evidence of metastatic disease or primary malignancy in the chest, abdomen, pelvis.  Additionally, the patient had a exophytic 25m nodule from the thyroid isthmus.  The patient underwent a left frontal craniotomy for tumor resection on 06/29/2019.  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  female Asian patient metastatic adeno ca of lung  [EGFR mutated] to the brain on osimertinib.  As per the son patient had episode of "sickness"-approximately 1 month ago that lasted for 10 days.  Episode consisted of dyspepsia/constipation/back pain anxiety.  Patient stopped osimertinib around the time.  Patient's symptoms resolved patient went back on  osimertinib.  She has not had recurrence of her symptoms.  Patient denies any diarrhea.  Most of constipation.  Denies any falls.  Complains of anxiety-however does not take her antianxiety medication.   Review of Systems  Constitutional:  Positive for malaise/fatigue. Negative for chills, diaphoresis and fever.  HENT:  Negative for nosebleeds and sore throat.   Eyes:  Negative for double vision.  Respiratory:  Negative for cough, hemoptysis, sputum production and wheezing.   Cardiovascular:  Negative for chest pain, palpitations, orthopnea and leg swelling.  Gastrointestinal:  Positive for nausea. Negative for abdominal pain, blood in stool, constipation, diarrhea, heartburn, melena and vomiting.  Genitourinary:  Negative for dysuria, frequency and urgency.  Musculoskeletal:  Negative for back pain and joint pain.  Skin: Negative.  Negative for itching and rash.  Neurological:  Positive for dizziness. Negative for tingling, focal weakness, weakness and headaches.  Endo/Heme/Allergies:  Does not bruise/bleed easily.  Psychiatric/Behavioral:  Negative for depression. The patient is not nervous/anxious and does not have insomnia.     MEDICAL HISTORY:  Past Medical History:  Diagnosis Date   Anxiety    Hypertension    Lung cancer ([facility]    Right Middle Lobe    SURGICAL HISTORY: Past Surgical History:  Procedure Laterality Date   APPLICATION OF CRANIAL NAVIGATION N/A 06/29/2019   Procedure: APPLICATION OF CRANIAL NAVIGATION;  Surgeon: OJudith Part MD;  [facility]  Service: Neurosurgery;  Laterality: N/A;   CRANIOTOMY Left 06/29/2019   Procedure: LEFT CRANIOTOMY FOR TUMOR EXCISION;  Surgeon: OJudith Part MD;  [facility]  Service: Neurosurgery;  Laterality: Left;    SOCIAL HISTORY: Social History   Socioeconomic History   Marital status: Married    Spouse name: Not on file   Number of children: Not on file   Years of education: Not on file   Highest  education level: Not on file  Occupational History   Not on file  Tobacco Use   Smoking status: Never   Smokeless tobacco: Never  Vaping Use   Vaping Use: Never used  Substance and Sexual Activity   Alcohol use: Never   Drug use: Never   Sexual activity: Not Currently    Birth control/protection: Post-menopausal  Other Topics Concern   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  She lives with her children in [place].   Social Determinants of Health   Financial Resource Strain: Not on file  Food Insecurity: Not on file  Transportation Needs: Not on file  Physical Activity: Not on file  Stress: Not on file  Social Connections: Not on file  Intimate Partner Violence: Not on file    FAMILY HISTORY: Family History  Problem Relation Age of Onset   Cancer Son        some type of cancer involving the brain    ALLERGIES:  is allergic to lisinopril.  MEDICATIONS:  Current Outpatient Medications  Medication Sig Dispense Refill   docusate sodium (COLACE) 100 MG capsule Take 1 capsule (100 mg total) by mouth 2 (two) times daily. 10 capsule 0   omeprazole (PRILOSEC) 20 MG capsule Take 1 capsule (20 mg total) by mouth daily. 30 capsule 0   osimertinib mesylate (TAGRISSO) 40 MG tablet TAKE 1 TABLET (40 MG TOTAL) BY MOUTH DAILY. 30 tablet 3   traZODone (DESYREL) 50 MG tablet Take 2 tablets (100 mg total) by mouth at bedtime. 30 tablet 0   acetaminophen (TYLENOL) 325 MG tablet Take 2 tablets (650 mg total) by mouth every 4 (four) hours as needed for mild pain (temp > 101.5). (Patient not taking: No sig reported)     Multiple Vitamins-Minerals (CENTRUM SILVER 50+WOMEN PO) Take by mouth. (Patient not taking: Reported on 08/15/2020)     No current facility-administered medications for this visit.      Marland Kitchen  PHYSICAL EXAMINATION: ECOG PERFORMANCE STATUS: 2 - Symptomatic, <50% confined to bed  Vitals:   08/15/20 1332   BP: (!) 145/74  Pulse: 86  Resp: 18  Temp: (!) 97.5 F (36.4 C)  SpO2: 100%   Filed Weights   08/15/20 1331  Weight: 112 lb (50.8 kg)    Physical Exam Constitutional:      Comments: Thin built  Asian woman; ambulating independently accompanied by son, Laverna Peace.  HENT:     Head: Normocephalic and atraumatic.     Mouth/Throat:     Pharynx: No oropharyngeal exudate.  Eyes:     Pupils: Pupils are equal, round, and reactive to light.  Cardiovascular:     Rate and Rhythm: Normal rate and regular rhythm.  Pulmonary:     Effort: Pulmonary effort is normal. No respiratory distress.     Breath sounds: Normal breath sounds. No wheezing.  Abdominal:     General: Bowel sounds are normal. There is no distension.     Palpations: Abdomen is soft. There is no mass.     Tenderness: no abdominal tenderness There is no guarding or rebound.  Musculoskeletal:  General: No tenderness. Normal range of motion.     Cervical back: Normal range of motion and neck supple.  Skin:    General: Skin is warm.  Neurological:     Mental Status: She is alert and oriented to person, place, and time.  Psychiatric:        Mood and Affect: Affect normal.     LABORATORY DATA:  I have reviewed the data as listed Lab Results  Component Value Date   WBC 7.0 08/15/2020   HGB 12.1 08/15/2020   HCT 36.8 08/15/2020   MCV 94.4 08/15/2020   PLT 184 08/15/2020   Recent Labs    09/01/19 0934 09/15/19 1339 10/17/19 1021 08/15/20 1431  NA 143 139 141 140  K 3.6 4.1 4.2 3.8  CL 106 106 105 105  CO2 27 28 28 30   GLUCOSE 114* 108* 97 124*  BUN 8 10 13 10   CREATININE 0.57 0.74 0.86 0.74  CALCIUM 9.2 8.9 9.1 9.1  GFRNONAA >60 >60 >60 >60  GFRAA >60 >60 >60  --   PROT 7.2 7.4 7.6 8.1  ALBUMIN 4.2 4.3 4.4 4.8  AST 15 16 22 22   ALT 12 14 13 18   ALKPHOS 62 61 48 76  BILITOT 0.7 0.9 0.6 1.0    RADIOGRAPHIC STUDIES: I have personally reviewed the radiological images as listed and agreed with the  findings in the report. No results found.  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 Currently Osimertinib 40 mg/day on hold secondary to moderate side effects  nausea/dizziness.   # Proceed with Osimertinib 40 mg/day [sec to poor tolerance/pts reluctance]; await CT scan chest 9/16. Last shipement 2 weeks ago-   #Brain metastases-status post resection followed by radiation.  Patient declines MRI.  Defer to radiation oncology-question CT scan with contrast.  # Nausea-grade 1-2 ; sec to Osimertinib; hold osimertinib.  Continue Zofran as needed.  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  # DISPOSITION: # MRI Brain; and CT chest non-contrast [same day] # labs today- cbc/cmp/mag  # follow up in 3 month - MD: labs- cbc/cmp.mag; -Dr.B  All questions were answered. The patient knows to call the clinic with any problems, questions or concerns.   Cammie Sickle, MD 08/15/2020 8:14 PM

## 2020-08-15 NOTE — Progress Notes (Signed)
Patient has missed several doses of the Tagrisso. (10 days missed of Tagrisso this month alone) due to abdominal pain/ fatigue/ back pain.

## 2020-08-20 ENCOUNTER — Other Ambulatory Visit (HOSPITAL_COMMUNITY): Payer: Self-pay

## 2020-09-09 ENCOUNTER — Other Ambulatory Visit (HOSPITAL_COMMUNITY): Payer: Self-pay

## 2020-09-09 ENCOUNTER — Other Ambulatory Visit: Payer: Self-pay | Admitting: Internal Medicine

## 2020-09-09 ENCOUNTER — Telehealth: Payer: Self-pay | Admitting: *Deleted

## 2020-09-09 DIAGNOSIS — C342 Malignant neoplasm of middle lobe, bronchus or lung: Secondary | ICD-10-CM

## 2020-09-09 MED ORDER — OSIMERTINIB MESYLATE 40 MG PO TABS
ORAL_TABLET | Freq: Every day | ORAL | 2 refills | Status: DC
  Filled 2020-09-09: qty 30, 30d supply, fill #0

## 2020-09-09 NOTE — Telephone Encounter (Signed)
Dr. Jacinto Reap - please see msg where patient is cnl scans. A msg was already sent to scheduling to r/s these scans.

## 2020-09-10 ENCOUNTER — Ambulatory Visit: Admission: RE | Admit: 2020-09-10 | Payer: Medicare Other | Source: Ambulatory Visit

## 2020-09-10 ENCOUNTER — Other Ambulatory Visit (HOSPITAL_COMMUNITY): Payer: Self-pay

## 2020-09-10 ENCOUNTER — Telehealth: Payer: Self-pay | Admitting: Internal Medicine

## 2020-09-10 NOTE — Telephone Encounter (Signed)
Spoke with patient's son about scan rescheduled (missed today's 7/19) due to illness. He was agreeable to the new day and times. Provided instructions and will send a copy in the mail also.

## 2020-09-12 ENCOUNTER — Other Ambulatory Visit (HOSPITAL_COMMUNITY): Payer: Self-pay

## 2020-09-26 ENCOUNTER — Ambulatory Visit
Admission: RE | Admit: 2020-09-26 | Discharge: 2020-09-26 | Disposition: A | Discharge status: Home / Self Care | Payer: Medicare Other | Source: Ambulatory Visit | Attending: Internal Medicine | Admitting: Internal Medicine

## 2020-09-26 ENCOUNTER — Other Ambulatory Visit: Payer: Self-pay

## 2020-09-26 DIAGNOSIS — C342 Malignant neoplasm of middle lobe, bronchus or lung: Secondary | ICD-10-CM

## 2020-09-26 DIAGNOSIS — C7931 Secondary malignant neoplasm of brain: Secondary | ICD-10-CM | POA: Insufficient documentation

## 2020-09-26 IMAGING — MR MR HEAD WO/W CM
15 series · 48 of 48 positions shown · IV contrast (5ml Gadavist)
Comparison: MRI head [DATE]

CLINICAL DATA: Metastatic lung cancer. Craniotomy for tumor
resection.

EXAM:
MRI HEAD WITHOUT AND WITH CONTRAST
TECHNIQUE: Multiplanar, multiecho pulse sequences of the brain and surrounding
structures were obtained without and with intravenous contrast.
CONTRAST:  5mL GADAVIST GADOBUTROL 1 MMOL/ML IV SOLN

[Series 5: ax dwi_tracew · axial · 3.0mm · 0.65mm/px · z∈[-159,-11]mm · 2 of 48 slices shown]
[im 1/48]
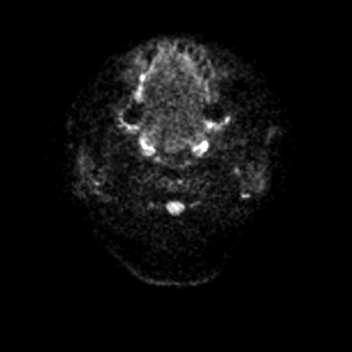
[im 48/48]
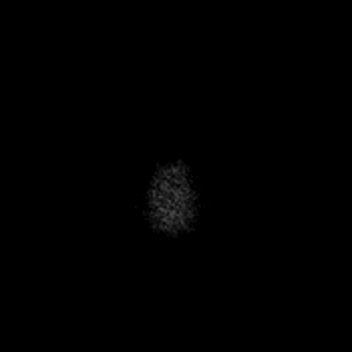

[Series 6: ax dwi_adc · axial · 3.0mm · 0.65mm/px · z∈[-159,-11]mm · 3 of 48 slices shown]
[im 1/48]
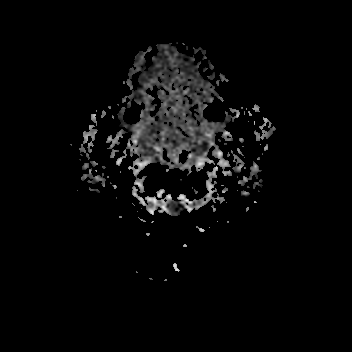
[im 24/48]
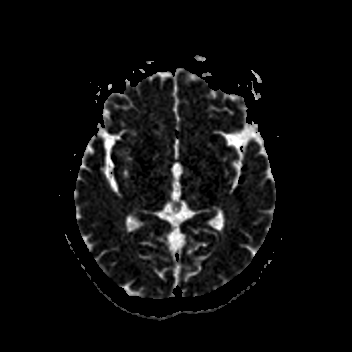
[im 48/48]
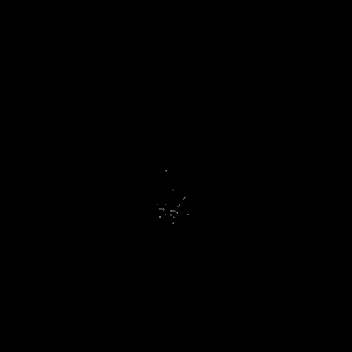

[Series 7: cor dwi_tracew · coronal · 5.0mm · 0.65mm/px · 2 of 36 slices shown]
[im 1/36]
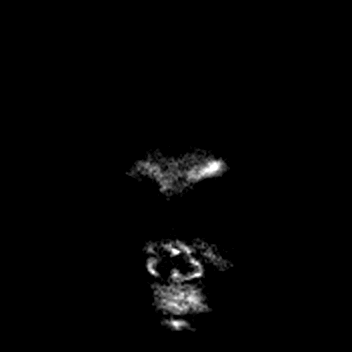
[im 36/36]
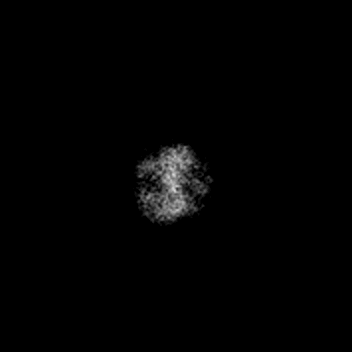

[Series 8: cor dwi_adc · coronal · 5.0mm · 0.65mm/px · 2 of 36 slices shown]
[im 1/36]
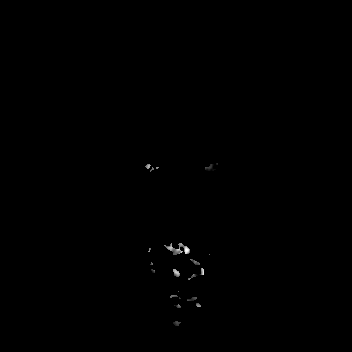
[im 36/36]
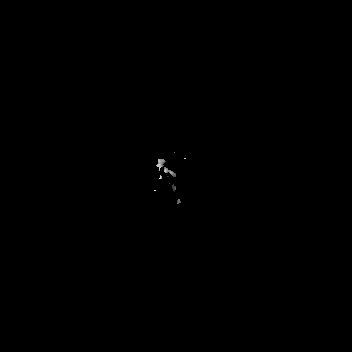

[Series 9: T1 · sagittal · 5.0mm · 0.62mm/px · 1 of 23 slices shown (1 of 2)]
[im 1/23]
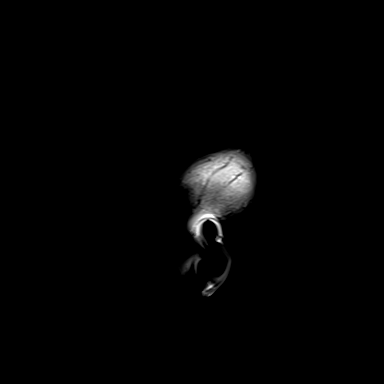

[Series 10: T2 · axial · 5.0mm · 0.53mm/px · 1 of 25 slices shown]
[im 1/25]
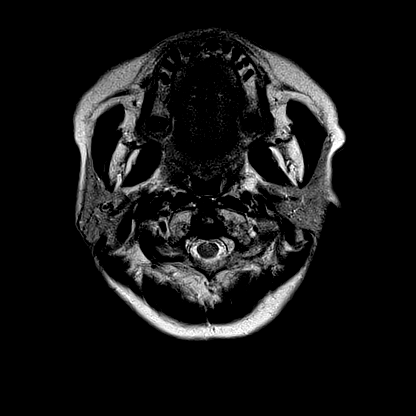

[Series 11: mag_images · axial · 3.0mm · 0.90mm/px · z∈[-168,+0]mm · 3 of 60 slices shown]
[im 1/60]
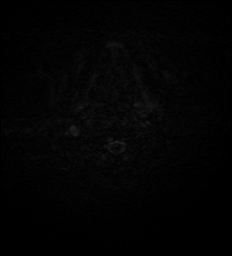
[im 30/60]
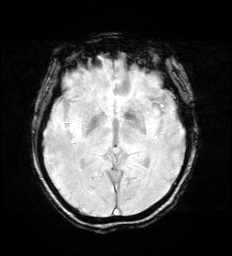
[im 60/60]
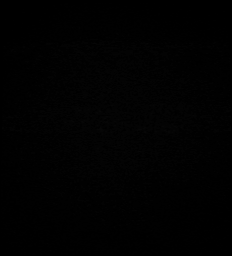

[Series 12: pha_images · axial · 3.0mm · 0.90mm/px · z∈[-165,+0]mm · 3 of 56 slices shown]
[im 1/56]
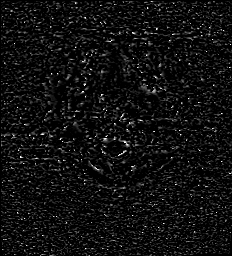
[im 28/56]
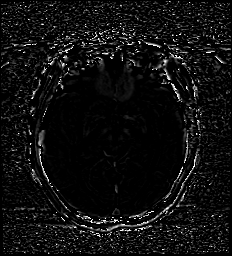
[im 56/56]
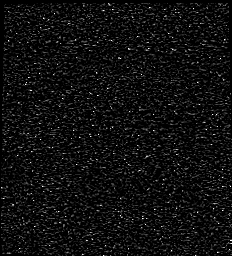

[Series 13: swi_images · axial · 3.0mm · 0.90mm/px · z∈[-168,+0]mm · 3 of 60 slices shown]
[im 1/60]
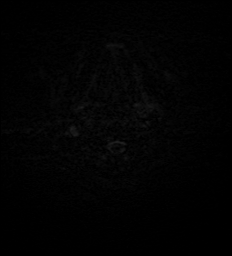
[im 30/60]
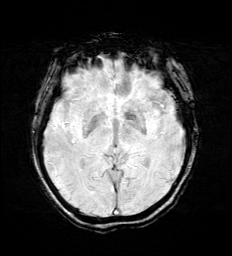
[im 60/60]
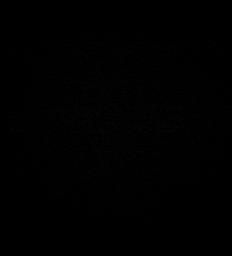

[Series 15: FLAIR · axial · 3.0mm · 0.53mm/px · z∈[-160,-6]mm · 3 of 55 slices shown]
[im 1/55]
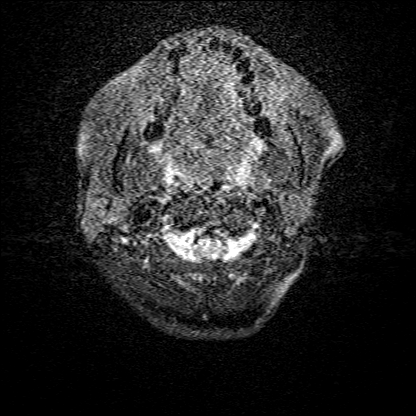
[im 28/55]
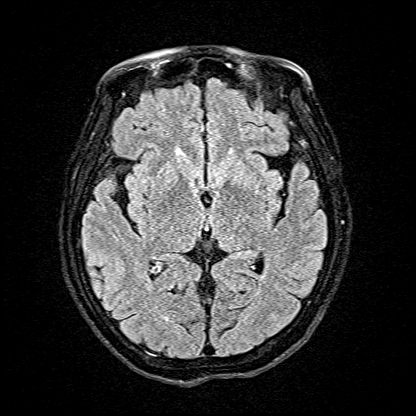
[im 55/55]
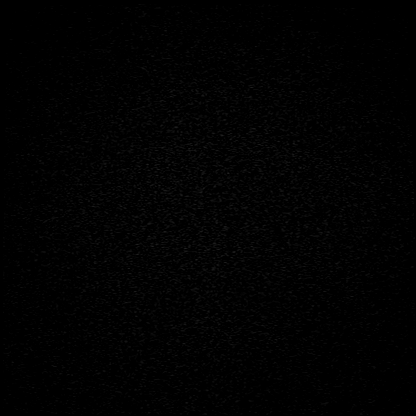

[Series 16: T1 · axial · 1.0mm · 0.98mm/px · z∈[-166,+1]mm · 10 of 176 slices shown (2 of 2)]
[im 1/176]
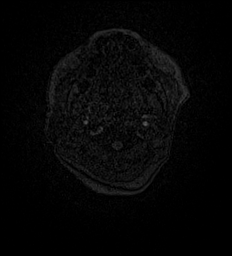
[im 20/176]
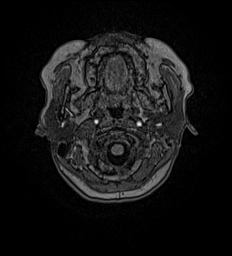
[im 39/176]
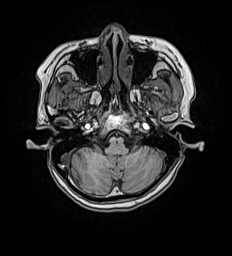
[im 59/176]
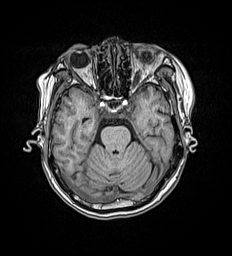
[im 78/176]
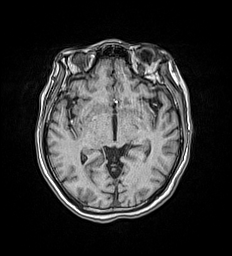
[im 98/176]
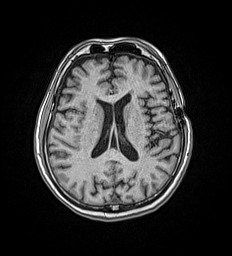
[im 117/176]
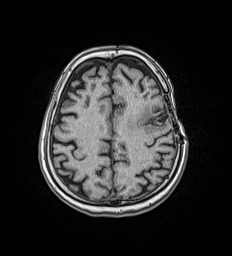
[im 137/176]
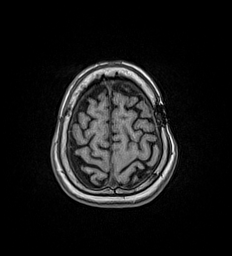
[im 156/176]
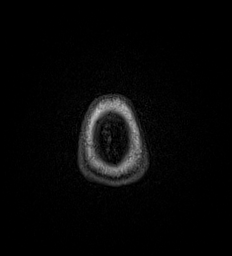
[im 176/176]
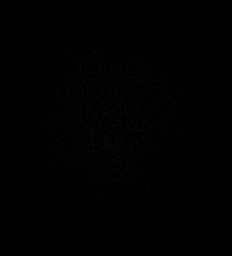

[Series 17: T2 post-contrast · coronal · 5.0mm · 0.57mm/px · 2 of 29 slices shown]
[im 1/29]
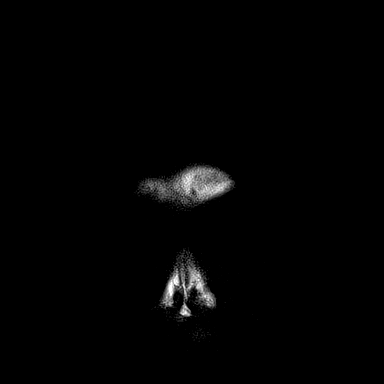
[im 29/29]
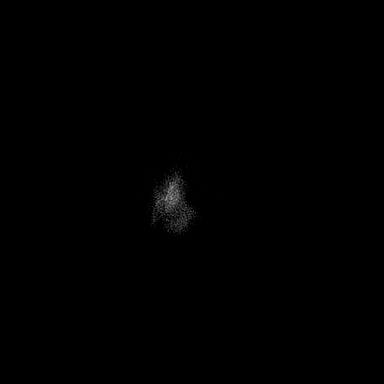

[Series 18: T1 post-contrast · axial · 1.0mm · 0.98mm/px · z∈[-166,+1]mm · 10 of 176 slices shown (1 of 3)]
[im 1/176]
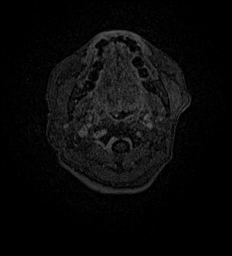
[im 20/176]
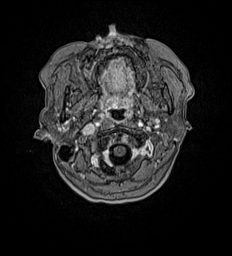
[im 39/176]
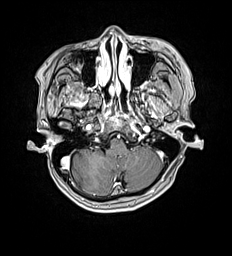
[im 59/176]
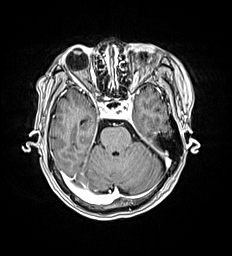
[im 78/176]
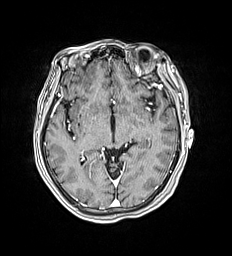
[im 98/176]
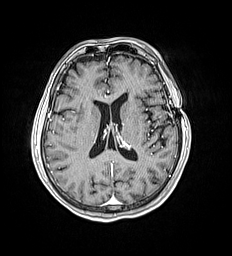
[im 117/176]
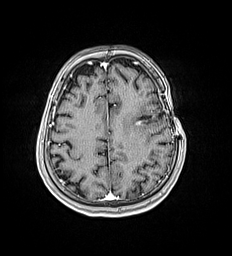
[im 137/176]
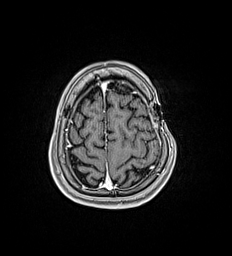
[im 156/176]
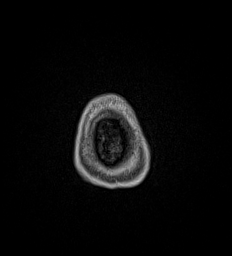
[im 176/176]
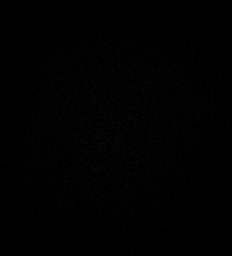

[Series 19: T1 post-contrast · coronal · 5.0mm · 0.57mm/px · 2 of 29 slices shown (2 of 3)]
[im 1/29]
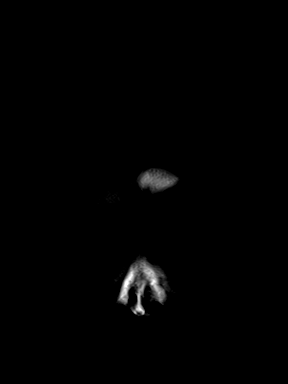
[im 29/29]
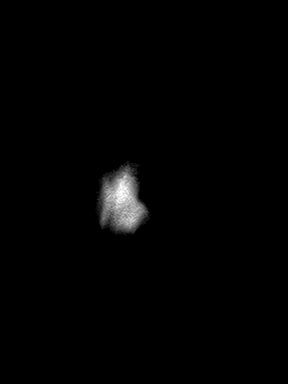

[Series 20: T1 post-contrast · sagittal · 5.0mm · 0.62mm/px · 1 of 23 slices shown (3 of 3)]
[im 1/23]
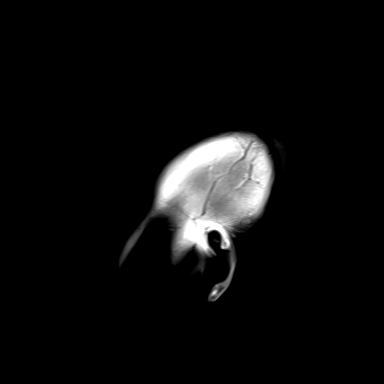

[48 of 48 positions shown; findings below may reference images not displayed]

FINDINGS: Brain: Postop left frontal tumor resection. Contracting hematoma at
the resection site. Small area of linear enhancement at the deep
resection margin. T1 shortening from hematoma at the resection site
previously precluded evaluation of enhancing tumor. Improvement in
dural thickening and enhancement since the prior study the
craniotomy site. Improvement in surrounding white matter FLAIR
hyperintensity

Ventricle size normal. No midline shift. No acute infarct. No second
lesion identified

Vascular: Normal arterial flow voids

Skull and upper cervical spine: Left frontal craniotomy

Sinuses/Orbits: Negative

Other: None
IMPRESSION: Postop left frontal tumor resection. Interval improvement in
hematoma at the resection site. Decreased surrounding edema

Small linear area of enhancement at the deep margin of the resection
cavity may represent postoperative enhancement. Continued attention
on follow-up scans recommended.

## 2020-09-26 IMAGING — CT CT CHEST W/O CM
2 of 3 series · 15 of 36 positions shown, 18 images · non-contrast
Comparison: [DATE]

CLINICAL DATA: Non-small cell lung cancer.  Restaging.

EXAM:
CT CHEST WITHOUT CONTRAST
TECHNIQUE: Multidetector CT imaging of the chest was performed following the
standard protocol without IV contrast.

[Series 2: thorax · axial · 0.62mm/px · z∈[-465,-221]mm · 12 of 144 slices shown, 15 images]
[im 11/144  mediastinal]
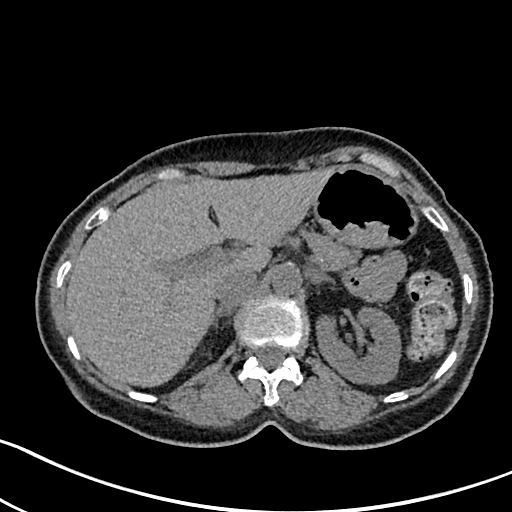
[im 11/144  lung]
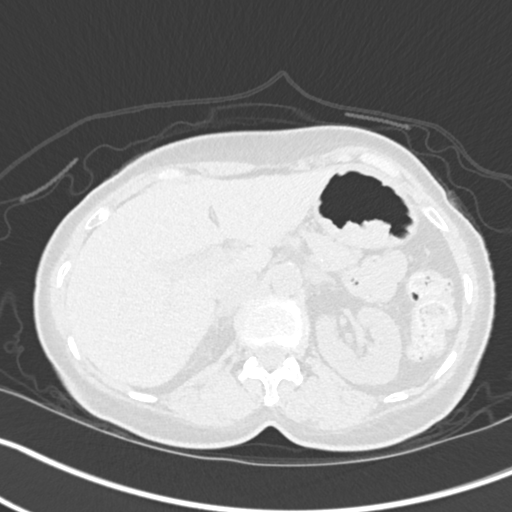
[im 22/144  lung]
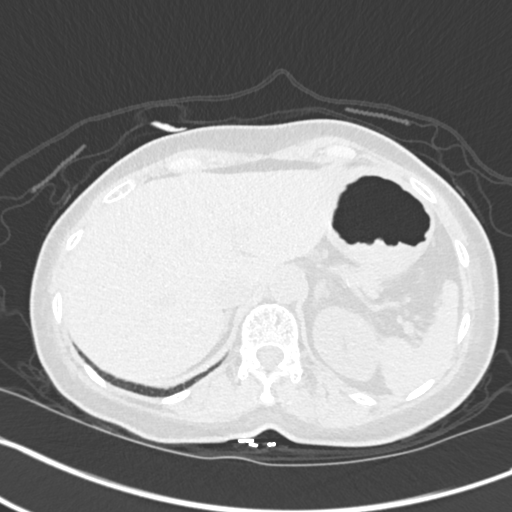
[im 32/144  lung]
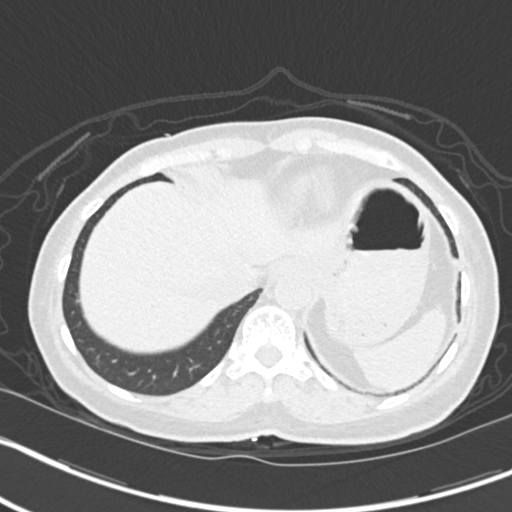
[im 43/144  lung]
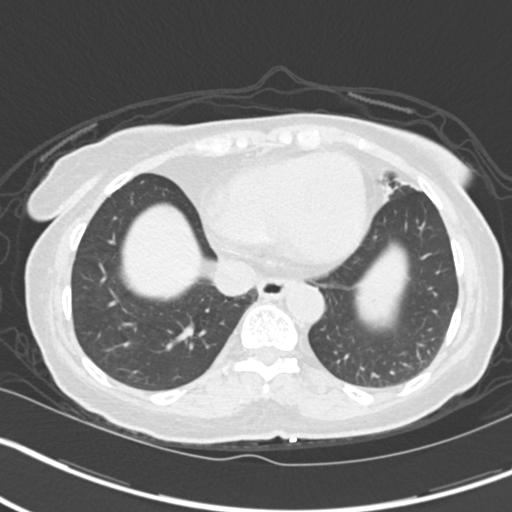
[im 53/144  mediastinal]
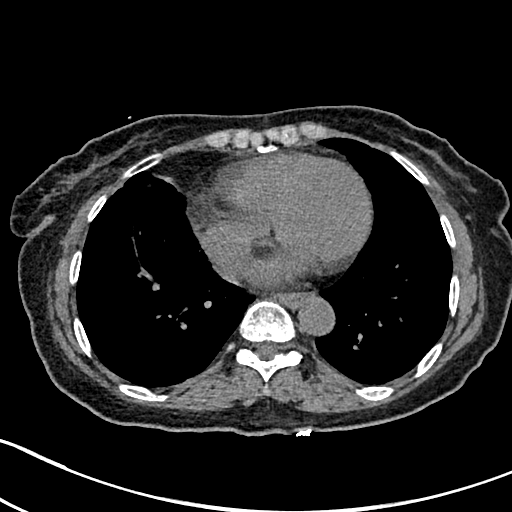
[im 53/144  lung]
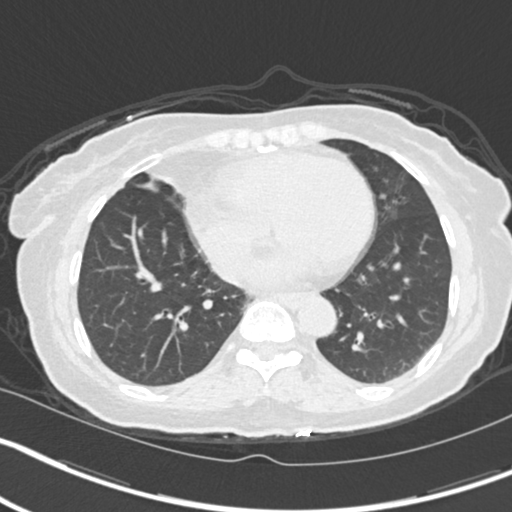
[im 64/144  lung]
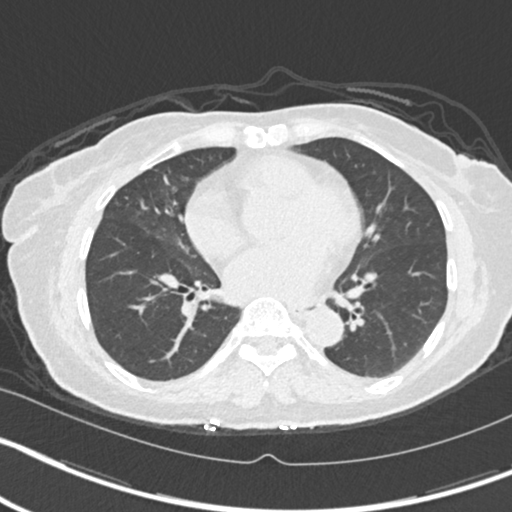
[im 80/144  lung]
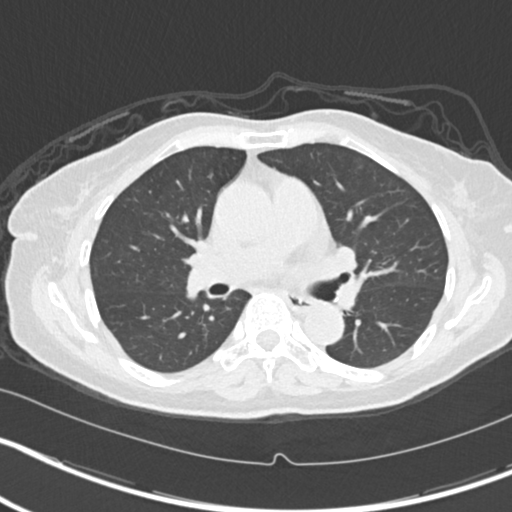
[im 91/144  lung]
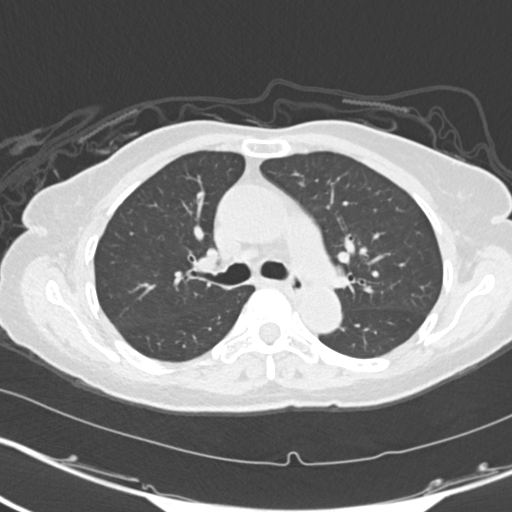
[im 101/144  mediastinal]
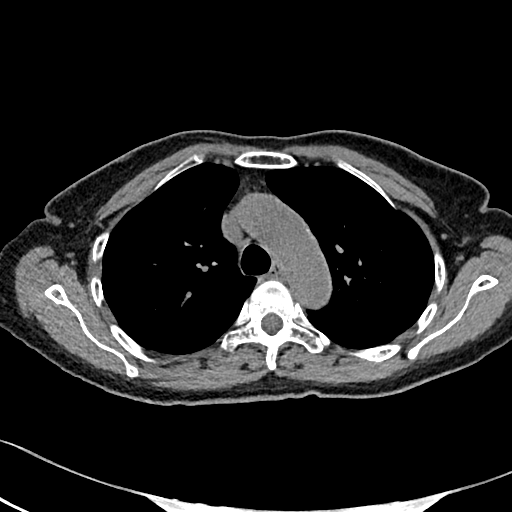
[im 101/144  lung]
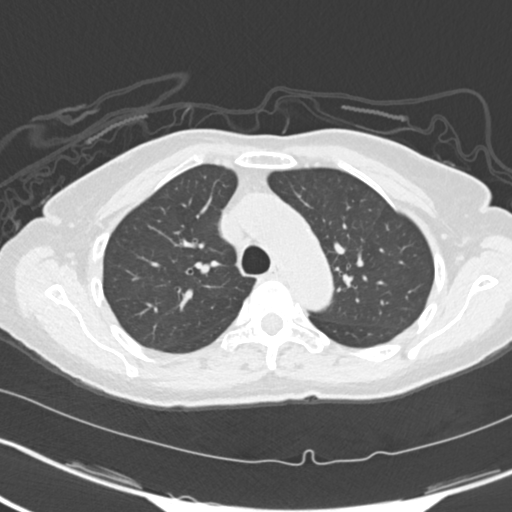
[im 112/144  lung]
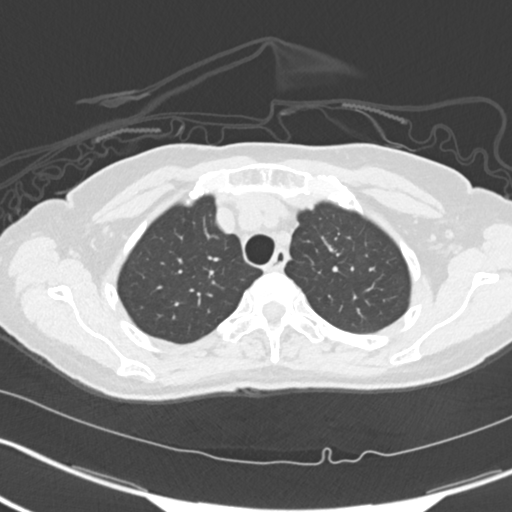
[im 122/144  lung]
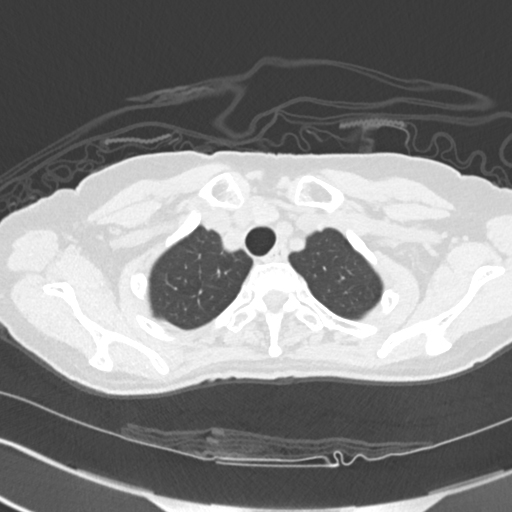
[im 133/144  lung]
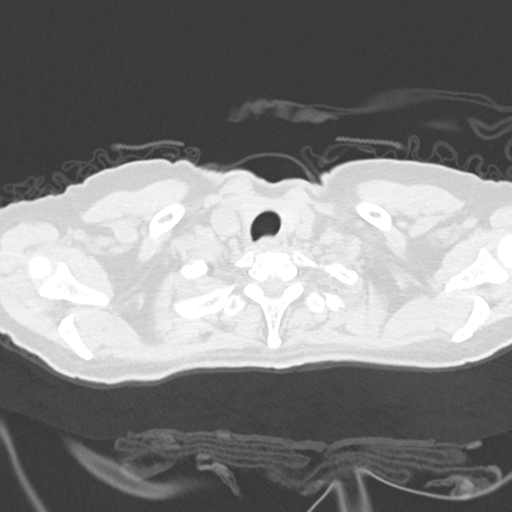

[Series 5: coronal · coronal · 0.59mm/px · 3 of 151 slices shown]
[im 31/151  lung]
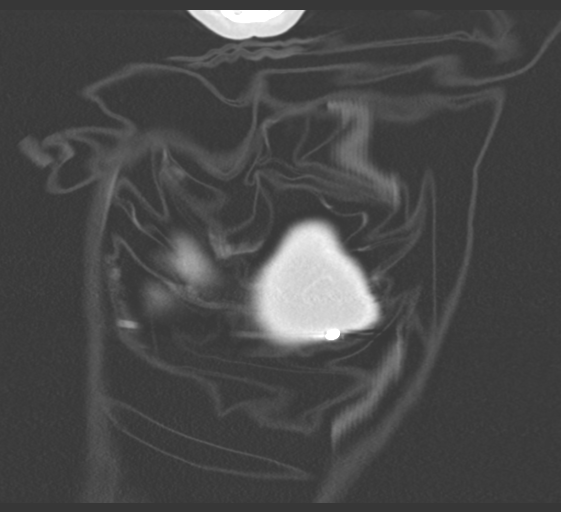
[im 61/151  lung]
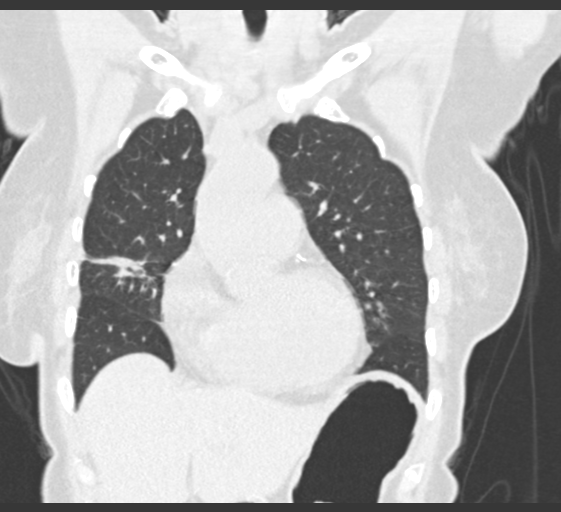
[im 91/151  lung]
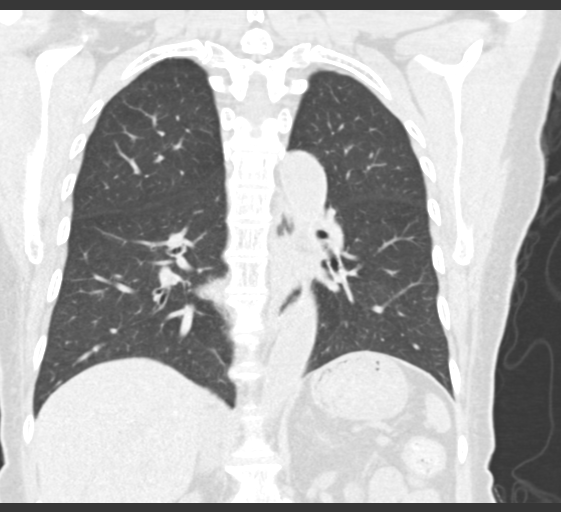

[15 of 36 positions shown; findings below may reference images not displayed]

FINDINGS: Cardiovascular: Mild cardiac enlargement. Aortic atherosclerosis.
Lad coronary artery calcifications. No pericardial effusion.

Mediastinum/Nodes: Normal appearance of the thyroid gland. The
trachea appears patent and is midline. Normal appearance of the
esophagus.

Lungs/Pleura: No pleural effusion identified. New subsegmental
atelectasis and or scarring with volume loss involving the right
middle lobe. This follows a bandlike distribution compatible with
progressive changes secondary to external beam radiation. The
original primary lesion within the anterior right middle lobe is no
longer measurable separate from these changes. No new suspicious
lung nodules.

Upper Abdomen: No acute abnormality.

Musculoskeletal: No chest wall mass or suspicious bone lesions
identified.
IMPRESSION: 1. Progressive changes secondary to external beam radiation within
the right middle lobe.
2. No specific findings identified to suggest residual or recurrence
of tumor.
3. Coronary artery calcifications noted.
4. Aortic atherosclerosis.

Aortic Atherosclerosis ([9Z]-[9Z]).

## 2020-09-26 MED ORDER — GADOBUTROL 1 MMOL/ML IV SOLN
5.0000 mL | Freq: Once | INTRAVENOUS | Status: AC | PRN
  Administered 2020-09-26: 5 mL via INTRAVENOUS

## 2020-10-07 ENCOUNTER — Other Ambulatory Visit (HOSPITAL_COMMUNITY): Payer: Self-pay

## 2020-10-29 ENCOUNTER — Other Ambulatory Visit (HOSPITAL_COMMUNITY): Payer: Self-pay

## 2020-10-31 ENCOUNTER — Encounter: Payer: Self-pay | Admitting: Emergency Medicine

## 2020-10-31 ENCOUNTER — Other Ambulatory Visit: Payer: Self-pay

## 2020-10-31 ENCOUNTER — Emergency Department
Admission: EM | Admit: 2020-10-31 | Discharge: 2020-10-31 | Disposition: A | Discharge status: Home / Self Care | Payer: Medicare Other | Attending: Emergency Medicine | Admitting: Emergency Medicine

## 2020-10-31 DIAGNOSIS — G4700 Insomnia, unspecified: Secondary | ICD-10-CM | POA: Insufficient documentation

## 2020-10-31 DIAGNOSIS — Z85118 Personal history of other malignant neoplasm of bronchus and lung: Secondary | ICD-10-CM | POA: Insufficient documentation

## 2020-10-31 DIAGNOSIS — R531 Weakness: Secondary | ICD-10-CM | POA: Diagnosis not present

## 2020-10-31 DIAGNOSIS — R002 Palpitations: Secondary | ICD-10-CM | POA: Diagnosis not present

## 2020-10-31 DIAGNOSIS — Z86011 Personal history of benign neoplasm of the brain: Secondary | ICD-10-CM | POA: Insufficient documentation

## 2020-10-31 DIAGNOSIS — I10 Essential (primary) hypertension: Secondary | ICD-10-CM | POA: Diagnosis not present

## 2020-10-31 DIAGNOSIS — R5383 Other fatigue: Secondary | ICD-10-CM | POA: Insufficient documentation

## 2020-10-31 LAB — URINALYSIS, COMPLETE (UACMP) WITH MICROSCOPIC
Bacteria, UA: NONE SEEN
Bilirubin Urine: NEGATIVE
Glucose, UA: NEGATIVE mg/dL
Hgb urine dipstick: NEGATIVE
Ketones, ur: NEGATIVE mg/dL
Leukocytes,Ua: NEGATIVE
Nitrite: NEGATIVE
Protein, ur: NEGATIVE mg/dL
Specific Gravity, Urine: 1.015 (ref 1.005–1.030)
pH: 8.5 — ABNORMAL HIGH (ref 5.0–8.0)

## 2020-10-31 LAB — TSH: TSH: 0.883 u[IU]/mL (ref 0.350–4.500)

## 2020-10-31 LAB — CBC WITH DIFFERENTIAL/PLATELET
Abs Immature Granulocytes: 0.06 10*3/uL (ref 0.00–0.07)
Basophils Absolute: 0.1 10*3/uL (ref 0.0–0.1)
Basophils Relative: 1 %
Eosinophils Absolute: 0.1 10*3/uL (ref 0.0–0.5)
Eosinophils Relative: 1 %
HCT: 41.5 % (ref 36.0–46.0)
Hemoglobin: 14 g/dL (ref 12.0–15.0)
Immature Granulocytes: 1 %
Lymphocytes Relative: 15 %
Lymphs Abs: 1.5 10*3/uL (ref 0.7–4.0)
MCH: 31.4 pg (ref 26.0–34.0)
MCHC: 33.7 g/dL (ref 30.0–36.0)
MCV: 93 fL (ref 80.0–100.0)
Monocytes Absolute: 0.4 10*3/uL (ref 0.1–1.0)
Monocytes Relative: 4 %
Neutro Abs: 8 10*3/uL — ABNORMAL HIGH (ref 1.7–7.7)
Neutrophils Relative %: 78 %
Platelets: 217 10*3/uL (ref 150–400)
RBC: 4.46 MIL/uL (ref 3.87–5.11)
RDW: 12.9 % (ref 11.5–15.5)
WBC: 10.1 10*3/uL (ref 4.0–10.5)
nRBC: 0 % (ref 0.0–0.2)

## 2020-10-31 LAB — COMPREHENSIVE METABOLIC PANEL
ALT: 12 U/L (ref 0–44)
AST: 35 U/L (ref 15–41)
Albumin: 4.5 g/dL (ref 3.5–5.0)
Alkaline Phosphatase: 76 U/L (ref 38–126)
Anion gap: 9 (ref 5–15)
BUN: 11 mg/dL (ref 8–23)
CO2: 27 mmol/L (ref 22–32)
Calcium: 9.4 mg/dL (ref 8.9–10.3)
Chloride: 103 mmol/L (ref 98–111)
Creatinine, Ser: 0.79 mg/dL (ref 0.44–1.00)
GFR, Estimated: >60 mL/min (ref >60)
Glucose, Bld: 103 mg/dL — ABNORMAL HIGH (ref 70–99)
Potassium: 4.5 mmol/L (ref 3.5–5.1)
Sodium: 139 mmol/L (ref 135–145)
Total Bilirubin: 1.4 mg/dL — ABNORMAL HIGH (ref 0.3–1.2)
Total Protein: 7.9 g/dL (ref 6.5–8.1)

## 2020-10-31 LAB — TROPONIN I (HIGH SENSITIVITY): Troponin I (High Sensitivity): 7 ng/L (ref <18)

## 2020-10-31 LAB — T4, FREE: Free T4: 0.98 ng/dL (ref 0.61–1.12)

## 2020-10-31 MED ORDER — SODIUM CHLORIDE 0.9 % IV BOLUS
1000.0000 mL | Freq: Once | INTRAVENOUS | Status: AC
  Administered 2020-10-31: 1000 mL via INTRAVENOUS

## 2020-10-31 MED ORDER — LORAZEPAM 1 MG PO TABS
1.0000 mg | ORAL_TABLET | Freq: Three times a day (TID) | ORAL | 0 refills | Status: DC | PRN
Start: 2020-10-31 — End: 2021-10-26

## 2020-10-31 MED ORDER — LORAZEPAM 2 MG/ML IJ SOLN
0.5000 mg | Freq: Once | INTRAMUSCULAR | Status: AC
  Administered 2020-10-31: 0.5 mg via INTRAVENOUS
  Filled 2020-10-31: qty 1

## 2020-10-31 NOTE — ED Triage Notes (Addendum)
EMS brings pt in from home for c/o insomnia since Friday normally takes trazodone for sleep but hasn't been taking it since it doesn't help; pt denies any c/o at present except "feeling tired"; pt speaks [place] Glad and son is with pt at present to translate; reports that pt was seen for same 1-64yrs ago and received an IV medication that helped to calm her down to sleep

## 2020-10-31 NOTE — ED Notes (Signed)
IV attempt x 2 without success. Able to obtain blood, pt tolerated well. Called another RN to try IV start.

## 2020-10-31 NOTE — ED Provider Notes (Signed)
[facility] Emergency Department Provider Note   ____________________________________________   Event Date/Time   First MD Initiated Contact with Patient 10/31/20 [phone]     (approximate)  I have reviewed the triage vital signs and the nursing notes.   HISTORY  Chief Complaint Insomnia  History obtained via patient's son  HPI Sara Schroeder is a 70 y.o. female brought to the ED via EMS from home with a chief complaint of insomnia.  Patient has a history of insomnia, metastatic lung cancer, hypertension, anxiety who has not been able to sleep for more than 15 to 20 minutes nightly for the past 2 weeks.  Complains of feeling tired and weak all over.  Occasional palpitations which she attributes to feeling tired.  Does not take trazodone every night but has been taking it for the past several nights without relief of symptoms.  Denies fever, cough, chest pain, shortness of breath, abdominal pain, nausea or vomiting.  Denies caffeine intake.  History of similar seen in the ED several years ago with IV medication which helped her sleep.     Past Medical History:  Diagnosis Date   Anxiety    Hypertension    Lung cancer [facility])    Right Middle Lobe    Patient Active Problem List   Diagnosis Date Noted   Metastatic adenocarcinoma (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   Brain metastasis ([place]) 07/06/2019   Acute hypoxemic respiratory failure ([place])    Intracerebral hemorrhage ([place]) 06/27/2019    Past Surgical History:  Procedure Laterality Date   APPLICATION OF CRANIAL NAVIGATION N/A 06/29/2019   Procedure: APPLICATION OF CRANIAL NAVIGATION;  Surgeon: Judith Part, MD;  Location: [place];  Service: Neurosurgery;  Laterality: N/A;   CRANIOTOMY Left 06/29/2019   Procedure: LEFT CRANIOTOMY FOR TUMOR EXCISION;  Surgeon:  Judith Part, MD;  Location: [place];  Service: Neurosurgery;  Laterality: Left;    Prior to Admission medications   Medication Sig Start Date End Date Taking? Authorizing Provider  LORazepam (ATIVAN) 1 MG tablet Take 1 tablet (1 mg total) by mouth every 8 (eight) hours as needed for anxiety or sleep. 10/31/20  Yes Paulette Blanch, MD  acetaminophen (TYLENOL) 325 MG tablet Take 2 tablets (650 mg total) by mouth every 4 (four) hours as needed for mild pain (temp > 101.5). Patient not taking: No sig reported 07/11/19   Angiulli, Lavon Paganini, PA-C  docusate sodium (COLACE) 100 MG capsule Take 1 capsule (100 mg total) by mouth 2 (two) times daily. 07/11/19   Angiulli, Lavon Paganini, PA-C  Multiple Vitamins-Minerals (CENTRUM SILVER 50+WOMEN PO) Take by mouth. Patient not taking: Reported on 08/15/2020    [provider]  omeprazole (PRILOSEC) 20 MG capsule Take 1 capsule (20 mg total) by mouth daily. 07/12/19   Angiulli, Lavon Paganini, PA-C  osimertinib mesylate (TAGRISSO) 40 MG tablet TAKE 1 TABLET (40 MG TOTAL) BY MOUTH DAILY. 09/09/20 09/09/21  Cammie Sickle, MD  traZODone (DESYREL) 50 MG tablet Take 2 tablets (100 mg total) by mouth at bedtime. 07/12/19   Angiulli, Lavon Paganini, PA-C    Allergies Lisinopril  Family History  Problem Relation Age of Onset   Cancer Son        some type of cancer involving the brain    Social History Social History  Tobacco Use   Smoking status: Never   Smokeless tobacco: Never  Vaping Use   Vaping Use: Never used  Substance Use Topics   Alcohol use: Never   Drug use: Never    Review of Systems  Constitutional: Positive for insomnia.  No fever/chills Eyes: No visual changes. ENT: No sore throat. Cardiovascular: Positive for palpitations.  Denies chest pain. Respiratory: Denies shortness of breath. Gastrointestinal: No abdominal pain.  No nausea, no vomiting.  No diarrhea.  No constipation. Genitourinary: Negative for dysuria. Musculoskeletal:  Negative for back pain. Skin: Negative for rash. Neurological: Negative for headaches, focal weakness or numbness. Psychiatric: Positive for anxiety.  ____________________________________________   PHYSICAL EXAM:  VITAL SIGNS: ED Triage Vitals  Enc Vitals Group     BP 10/31/20 0418 (!) 180/81     Pulse Rate 10/31/20 0418 85     Resp 10/31/20 0418 18     Temp 10/31/20 0418 97.8 F (36.6 C)     Temp Source 10/31/20 0418 Oral     SpO2 10/31/20 0418 99 %     Weight 10/31/20 0425 100 lb (45.4 kg)     Height 10/31/20 0425 5\' 1"  (1.549 m)     Head Circumference --      Peak Flow --      Pain Score 10/31/20 0423 0     Pain Loc --      Pain Edu? --      Excl. in [place]? --     Constitutional: Alert and oriented.  Cachectic appearing and in no acute distress. Eyes: Conjunctivae are normal. PERRL. EOMI. Head: Atraumatic. Nose: No congestion/rhinnorhea. Mouth/Throat: Mucous membranes are mildly dry. Neck: No stridor.   Cardiovascular: Normal rate, regular rhythm. Grossly normal heart sounds.  Good peripheral circulation. Respiratory: Normal respiratory effort.  No retractions. Lungs CTAB. Gastrointestinal: Soft and nontender. No distention. No abdominal bruits. No CVA tenderness. Musculoskeletal: No lower extremity tenderness nor edema.  No joint effusions. Neurologic:  Normal speech and language. No gross focal neurologic deficits are appreciated.  Skin:  Skin is warm, dry and intact. No rash noted. Psychiatric: Mood and affect are normal. Speech and behavior are normal.  ____________________________________________   LABS (all labs ordered are listed, but only abnormal results are displayed)  Labs Reviewed  COMPREHENSIVE METABOLIC PANEL - Abnormal; Notable for the following components:      Result Value   Glucose, Bld 103 (*)    Total Bilirubin 1.4 (*)    All other components within normal limits  URINALYSIS, COMPLETE (UACMP) WITH MICROSCOPIC - Abnormal; Notable for the  following components:   pH 8.5 (*)    All other components within normal limits  CBC WITH DIFFERENTIAL/PLATELET - Abnormal; Notable for the following components:   Neutro Abs 8.0 (*)    All other components within normal limits  TSH  T4, FREE  CBC WITH DIFFERENTIAL/PLATELET  TROPONIN I (HIGH SENSITIVITY)   ____________________________________________  EKG  ED ECG REPORT I, Miaya Lafontant J, the attending physician, personally viewed and interpreted this ECG.   Date: 10/31/2020  EKG Time: 0536  Rate: 77  Rhythm: normal sinus rhythm  Axis: Normal  Intervals:none  ST&T Change: Nonspecific  ____________________________________________  RADIOLOGY I, Seamus Warehime J, personally viewed and evaluated these images (plain radiographs) as part of my medical decision making, as well as reviewing the written report by the radiologist.  ED MD interpretation: None  Official radiology report(s): No results found.  ____________________________________________   PROCEDURES  Procedure(s) performed (including Critical Care):  Marland Kitchen  1-3 Lead EKG Interpretation Performed by: Paulette Blanch, MD Authorized by: Paulette Blanch, MD     Interpretation: normal     ECG rate:  78   ECG rate assessment: normal     Rhythm: sinus rhythm     Ectopy: none     Conduction: normal   Comments:     Patient placed on cardiac monitor to evaluate for arrhythmias   ____________________________________________   INITIAL IMPRESSION / ASSESSMENT AND PLAN / ED COURSE  As part of my medical decision making, I reviewed the following data within the electronic MEDICAL RECORD NUMBER History obtained from family, Nursing notes reviewed and incorporated, Labs reviewed, EKG interpreted, Old chart reviewed, and Notes from prior ED visits     70 year old female presenting with insomnia and occasional palpitations. Differential diagnosis includes, but is not limited to, ACS, aortic dissection, pulmonary embolism, cardiac tamponade,  pneumothorax, pneumonia, pericarditis, myocarditis, GI-related causes including esophagitis/gastritis, and musculoskeletal chest wall pain.     Will obtain cardiac panel, check thyroid function.  Administer IV Ativan and reassess.  Clinical Course as of 10/31/20 0708  Swinger Oct 31, 2020  1505 Patient sleeping soundly.  Updated son of test results thus far; awaiting results of CBC.  Anticipate patient may be discharged home on as needed Ativan to replace trazodone for sleep.  Strict return precautions given.  Son verbalizes understanding and agrees with plan of care. [JS]  0707 CBC normal. [JS]    Clinical Course User Index [JS] Paulette Blanch, MD     ____________________________________________   FINAL CLINICAL IMPRESSION(S) / ED DIAGNOSES  Final diagnoses:  Insomnia, unspecified type     ED Discharge Orders          Ordered    LORazepam (ATIVAN) 1 MG tablet  Every 8 hours PRN        10/31/20 0611             Note:  This document was prepared using Dragon voice recognition software and may include unintentional dictation errors.    Paulette Blanch, MD 10/31/20 [phone]

## 2020-10-31 NOTE — Discharge Instructions (Signed)
You may take Ativan before bedtime as needed for sleep.  Return to the ER for worsening symptoms, persistent vomiting, difficulty breathing or other concerns.

## 2020-10-31 NOTE — ED Notes (Signed)
MD at the bedside  

## 2020-11-12 ENCOUNTER — Telehealth: Payer: Self-pay | Admitting: *Deleted

## 2020-11-12 NOTE — Telephone Encounter (Addendum)
Son Day called stating that patient has appointment for labs on Friday and she doe snot want to come due to just having blood drawn in ER and her sever fear of needles causing her anxiety to increase . She did not have a Mg+ drawn, but did have cbc/cmp drawn. She also has physician appointment as well.  Can this appointment be postponed?? Please advise  CBC with Differential/Platelet Order: [id number] Status: Final result   Visible to patient: Yes (seen)   Next appt: 11/15/2020 at 02:15 PM in Oncology ([facility])   0 Result Notes Component Ref Range & Units 12 d ago  (10/31/20) 2 mo ago  (08/15/20) 1 yr ago  (10/17/19) 1 yr ago  (09/15/19) 1 yr ago  (09/01/19) 1 yr ago  (08/11/19) 1 yr ago  (07/10/19)  WBC 4.0 - 10.5 K/uL 10.1  7.0  5.0  5.0  4.9  5.1  10.7 High    RBC 3.87 - 5.11 MIL/uL 4.46  3.90  3.64 Low   3.61 Low   3.79 Low   3.83 Low   3.85 Low    Hemoglobin 12.0 - 15.0 g/dL 14.0  12.1  11.1 Low   11.0 Low   11.7 Low   11.6 Low   11.9 Low    HCT 36.0 - 46.0 % 41.5  36.8  33.1 Low   33.5 Low   34.6 Low   35.8 Low   36.2   MCV 80.0 - 100.0 fL 93.0  94.4  90.9  92.8  91.3  93.5  94.0   MCH 26.0 - 34.0 pg 31.4  31.0  30.5  30.5  30.9  30.3  30.9   MCHC 30.0 - 36.0 g/dL 33.7  32.9  33.5  32.8  33.8  32.4  32.9   RDW 11.5 - 15.5 % 12.9  14.0  13.7  14.2  14.0  14.2  13.6   Platelets 150 - 400 K/uL 217  184  155  160  220  207  348   nRBC 0.0 - 0.2 % 0.0  0.0  0.0  0.0  0.0  0.0  0.0   Neutrophils Relative % % 78  74  62  60  65  62  72   Neutro Abs 1.7 - 7.7 K/uL 8.0 High   5.2  3.1  3.0  3.2  3.2  7.7   Lymphocytes Relative % 15  19  25  29  24  29  20    Lymphs Abs 0.7 - 4.0 K/uL 1.5  1.3  1.3  1.4  1.2  1.5  2.1   Monocytes Relative % 4  5  9  7  6  6  6    Monocytes Absolute 0.1 - 1.0 K/uL 0.4  0.4  0.4  0.3  0.3  0.3  0.6   Eosinophils Relative % 1  1  3  3  4  2  1    Eosinophils Absolute 0.0 - 0.5 K/uL 0.1  0.1  0.2  0.2  0.2  0.1  0.2   Basophils Relative % 1  1  1  1  1  1   0    Basophils Absolute 0.0 - 0.1 K/uL 0.1  0.0  0.1  0.0  0.1  0.0  0.0   Immature Granulocytes % 1  0  0  0  0  0  1   Abs Immature Granulocytes 0.00 - 0.07 K/uL 0.06  0.03 CM  0.01  CM  0.00 CM  0.01 CM  0.01 CM  0.08 High  CM   Comment: Performed at [facility], [address] [place], [place] [address]  [place]  [facility] CLIN LAB [place] CLIN LAB [place] CL[facility] LAB [place] CL[facility] LAB [place] [facility] [place] CL[facility] [place] [facility] LAB     Other Results from 10/31/2020  CBC with Differential Order: [id number] Status: In process   Visible to patient: No (not released)   Next appt: 11/15/2020 at 02:15 PM in Oncology ([facility])   0 Result Notes     Contains abnormal data Comprehensive metabolic panel Order: [id number] Status: Final result   Visible to patient: Yes (seen)   Next appt: 11/15/2020 at 02:15 PM in Oncology ([facility])   0 Result Notes Component Ref Range & Units 12 d ago  (10/31/20) 2 mo ago  (08/15/20) 1 yr ago  (10/17/19) 1 yr ago  (09/15/19) 1 yr ago  (09/01/19) 1 yr ago  (08/11/19) 1 yr ago  (07/10/19)  Sodium 135 - 145 mmol/L 139  140  141  139  143  142  137   Potassium 3.5 - 5.1 mmol/L 4.5  3.8  4.2  4.1  3.6  3.6  3.9   Comment: HEMOLYSIS AT THIS LEVEL MAY AFFECT RESULT  Chloride 98 - 111 mmol/L 103  105  105  106  106  105  103   CO2 22 - 32 mmol/L 27  30  28  28  27  26  23    Glucose, Bld 70 - 99 mg/dL 103 High   124 High  CM  97 CM  108 High  CM  114 High  CM  112 High  CM  127 High  CM   Comment: Glucose reference range applies only to samples taken after fasting for at least 8 hours.  BUN 8 - 23 mg/dL 11  10  13  10  8  10  22    Creatinine, Ser 0.44 - 1.00 mg/dL 0.79  0.74  0.86  0.74  0.57  0.59  0.68   Calcium 8.9 - 10.3 mg/dL 9.4  9.1  9.1  8.9  9.2  9.1  9.5   Total Protein 6.5 - 8.1 g/dL 7.9  8.1  7.6  7.4  7.2  7.5  7.3   Albumin 3.5 - 5.0 g/dL 4.5  4.8  4.4  4.3  4.2  4.5  3.5   AST 15 - 41 U/L 35  22  22  16  15  18  27    Comment: HEMOLYSIS AT THIS LEVEL MAY AFFECT  RESULT  ALT 0 - 44 U/L 12  18  13  14  12  15   58 High    Comment: HEMOLYSIS AT THIS LEVEL MAY AFFECT RESULT  Alkaline Phosphatase 38 - 126 U/L 76  76  48  61  62  68  70   Total Bilirubin 0.3 - 1.2 mg/dL 1.4 High   1.0  0.6  0.9  0.7  1.0  0.7   Comment: HEMOLYSIS AT THIS LEVEL MAY AFFECT RESULT  GFR, Estimated >60 mL/min >60  >60 CM        Comment: (NOTE)  Calculated using the CKD-EPI Creatinine Equation (2021)   Anion gap 5 - 15 9  5  CM  8 CM  5 CM  10 CM  11 CM  11 CM   Comment: Performed at [facility], [address] [place], [place] [address]  Resulting Agency  [place] CLIN LAB [place] CLIN LAB [place] CLIN LAB [place] CL[facility] LAB [place] [facility] LAB [place] [facility] [place] [facility] LAB      Troponin I (High Sensitivity) Order: [id number] Status: Final result   Visible to patient: Yes (seen)   Next appt: 11/15/2020 at 02:15 PM in Oncology ([facility])   0 Result Notes Component Ref Range & Units 12 d ago  (10/31/20) 1 yr ago  (06/26/19) 1 yr ago  (06/26/19)  Troponin I (High Sensitivity) <18 ng/L 7  14 CM  5 CM   Comment: (NOTE)  Elevated high sensitivity troponin I (hsTnI) values and significant  changes across serial measurements may suggest ACS but many other  chronic and acute conditions are known to elevate hsTnI results.  Refer to the "Links" section for chest pain algorithms and additional  guidance.  Performed at [facility], [address] [place],  [place] [address]   Resulting Agency  [facility] CLIN LAB [place] CLIN LAB [facility] CLIN LAB       Contains abnormal data Urinalysis, Complete w Microscopic Urine, Clean Catch Order: [id number] Status: Final result   Visible to patient: Yes (seen)   Next appt: 11/15/2020 at 02:15 PM in Oncology ([facility])   0 Result Notes Component Ref Range & Units 12 d ago 1 yr ago 2 yr ago  Color, Urine YELLOW YELLOW  STRAW Abnormal   COLORLESS Abnormal    APPearance CLEAR CLEAR  CLEAR Abnormal   CLEAR Abnormal    Specific Gravity, Urine 1.005 - 1.030 1.015  1.016   1.003 Low    pH 5.0 - 8.0 8.5 High   8.0  7.0   Glucose, UA NEGATIVE mg/dL NEGATIVE  NEGATIVE  NEGATIVE   Hgb urine dipstick NEGATIVE NEGATIVE  NEGATIVE  NEGATIVE   Bilirubin Urine NEGATIVE NEGATIVE  NEGATIVE  NEGATIVE   Ketones, ur NEGATIVE mg/dL NEGATIVE  NEGATIVE  NEGATIVE   Protein, ur NEGATIVE mg/dL NEGATIVE  100 Abnormal   NEGATIVE   Nitrite NEGATIVE NEGATIVE  NEGATIVE  NEGATIVE   Leukocytes,Ua NEGATIVE NEGATIVE  NEGATIVE    Squamous Epithelial / LPF 0 - 5 0-5  0-5 CM  0-5   WBC, UA 0 - 5 WBC/hpf 0-5  0-5  0-5   RBC / HPF 0 - 5 RBC/hpf 0-5  0-5    Bacteria, UA NONE SEEN NONE SEEN  NONE SEEN  RARE Abnormal    Comment: Performed at [facility], [address] [place], [place] [address]  Resulting Agency  [facility] CLIN LAB [place] CLIN LAB [place] CLIN LAB      TSH Order: [id number] Status: Final result   Visible to patient: Yes (seen)   Next appt: 11/15/2020 at 02:15 PM in Oncology ([facility])   0 Result Notes Component Ref Range & Units 12 d ago 1 yr ago 2 yr ago  TSH 0.350 - 4.500 uIU/mL 0.883  0.124 Low  CM  1.935 CM   Comment: Performed by a 3rd Generation assay with a functional sensitivity of <=0.01 uIU/mL.  Performed at [facility], [address] [place], [place] [address]   Resulting Agency  [facility] CLIN LAB [place] CLIN LAB [facility] CLIN LAB      T4, free Order: [id number] Status: Final result   Visible to patient: Yes (not seen)   Next appt: 11/15/2020 at 02:15 PM in Oncology ([facility])   0 Result Notes Component Ref Range & Units 12  d ago 1 yr ago  Free T4 0.61 - 1.12 ng/dL 0.98  0.99 CM   Comment: HEMOLYSIS AT THIS LEVEL MAY AFFECT RESULT  (NOTE)  Biotin ingestion may interfere with free T4 tests. If the results are  inconsistent with the TSH level, previous test results, or the  clinical presentation, then consider biotin interference. If needed,  order repeat testing after stopping biotin.  Performed at [facility], [address]  [place],   [address]   Resulting Agency  [facility] CLIN LAB [facility] CLIN LAB         Specimen Collected: 10/31/20 05:37 Last Resulted: 10/31/20 06:45

## 2020-11-13 NOTE — Telephone Encounter (Signed)
Spoke with patient's son. New apts provided

## 2020-11-13 NOTE — Telephone Encounter (Signed)
Attempted to reach pt's son, Day. Left detailed vm that ok to r/s if that is what the patient wants to do. No additional labs are needed at this time. I asked the son to call our office back to discuss the apt.

## 2020-11-15 ENCOUNTER — Inpatient Hospital Stay: Payer: Medicare Other

## 2020-11-15 ENCOUNTER — Inpatient Hospital Stay: Payer: Medicare Other | Admitting: Internal Medicine

## 2020-11-18 ENCOUNTER — Other Ambulatory Visit (HOSPITAL_COMMUNITY): Payer: Self-pay

## 2020-11-25 ENCOUNTER — Telehealth: Payer: Self-pay | Admitting: *Deleted

## 2020-11-25 NOTE — Telephone Encounter (Signed)
-----   Message from Cammie Sickle, MD sent at 11/23/2020  2:46 PM EDT ----- Regarding: Pt follow up Alyson- please check if patient is still getting her medication osimertinib.   GB

## 2020-11-25 NOTE — Telephone Encounter (Signed)
The pharmacy last sent her a month supply of medication on 09/12/20. From the pharmacy notes, it looks like when the pharmacy has called her she or her son have not set up additional refill delivery.   [place] call notes:  10/07/20: "Pt has been missing doses. Son would like a call back in a few weeks, after they've seen the doc."  10/29/20: "appt 9/23. Follow up after appt. she has plenty of medication left"  11/18/20: "Patient has not been taking for months. Saying they have 2 full bottles at home."

## 2020-12-02 ENCOUNTER — Telehealth: Payer: Self-pay | Admitting: *Deleted

## 2020-12-02 ENCOUNTER — Other Ambulatory Visit: Payer: Self-pay | Admitting: Pharmacist

## 2020-12-02 NOTE — Telephone Encounter (Signed)
Pt's son, Day, made aware and confirmed appt to follow up with Dr. B on 12/16/20 at 3:30pm. Day confirmed appt.

## 2020-12-02 NOTE — Telephone Encounter (Signed)
-----   Message from Darl Pikes, [place] sent at 12/02/2020 11:07 AM EDT ----- Regarding: RE: Pt follow up Done, I suspended her Rx and inactivated her with the pharmacy. When you want her to have it refilled again, just let me or Bethena Roys know and send a new Rx to the pharmacy.  Clearnce Sorrel  ----- Message ----- From: Cammie Sickle, MD Sent: 11/29/2020   9:44 PM EDT To: Gloris Ham, RN, [facility], RN, # Subject: RE: Pt follow up                               [place]-Please ask PharmacyOutPt to withhold prescription refills, until further prescription refills from physician.  Haley-Please inform son/patient of above. And check with the son with regards to the follow up plan.  Thanks, GB ----- Message ----- From: Darl Pikes, RPH-CPP Sent: 11/25/2020  10:00 AM EDT To: Gloris Ham, RN, [facility], RN, # Subject: RE: Pt follow up                               The pharmacy last sent her a month supply of medication on 09/12/20. From the pharmacy notes, it looks like when the pharmacy has called her she or her son have not set up additional refill delivery.  [place] call notes:  10/07/20: "Pt has been missing doses. Son would like a call back in a few weeks, after they've seen the doc." 10/29/20: "appt 9/23. Follow up after appt. she has plenty of medication left" 11/18/20: "Patient has not been taking for months. Saying they have 2 full bottles at home."  -Alyson  ----- Message ----- From: Cammie Sickle, MD Sent: 11/23/2020   2:46 PM EDT To: Gloris Ham, RN, [facility], RN, # Subject: Pt follow up                                   Clearnce Sorrel- please check if patient is still getting her medication osimertinib.   GB

## 2020-12-16 ENCOUNTER — Inpatient Hospital Stay: Payer: Medicare Other | Admitting: Internal Medicine

## 2021-01-21 ENCOUNTER — Telehealth: Payer: Self-pay | Admitting: *Deleted

## 2021-01-21 NOTE — Telephone Encounter (Signed)
I will look but he is off for 2 wks and it will probably be after thanksgiving we just have any time.

## 2021-01-21 NOTE — Telephone Encounter (Signed)
Pt's son, Day, is calling to schedule a follow up appt with Dr. Rogue Bussing for patient.

## 2021-02-03 ENCOUNTER — Telehealth: Payer: Self-pay | Admitting: *Deleted

## 2021-02-03 NOTE — Telephone Encounter (Signed)
Pt having increased lower back pain. She is scheduled to see Dr. B next week but her son, Day, is asking if she can be seen any sooner.   Please advise.

## 2021-02-04 ENCOUNTER — Other Ambulatory Visit: Payer: Self-pay

## 2021-02-04 ENCOUNTER — Inpatient Hospital Stay: Payer: Medicare Other

## 2021-02-04 ENCOUNTER — Inpatient Hospital Stay: Payer: Medicare Other | Attending: Hospice and Palliative Medicine | Admitting: Hospice and Palliative Medicine

## 2021-02-04 VITALS — BP 128/61 | HR 82 | Temp 98.4°F | Resp 18

## 2021-02-04 DIAGNOSIS — F419 Anxiety disorder, unspecified: Secondary | ICD-10-CM | POA: Insufficient documentation

## 2021-02-04 DIAGNOSIS — E876 Hypokalemia: Secondary | ICD-10-CM | POA: Insufficient documentation

## 2021-02-04 DIAGNOSIS — G893 Neoplasm related pain (acute) (chronic): Secondary | ICD-10-CM | POA: Diagnosis not present

## 2021-02-04 DIAGNOSIS — C7931 Secondary malignant neoplasm of brain: Secondary | ICD-10-CM | POA: Insufficient documentation

## 2021-02-04 DIAGNOSIS — M545 Low back pain, unspecified: Secondary | ICD-10-CM | POA: Insufficient documentation

## 2021-02-04 DIAGNOSIS — Z79899 Other long term (current) drug therapy: Secondary | ICD-10-CM | POA: Insufficient documentation

## 2021-02-04 DIAGNOSIS — D649 Anemia, unspecified: Secondary | ICD-10-CM | POA: Diagnosis not present

## 2021-02-04 DIAGNOSIS — Z515 Encounter for palliative care: Secondary | ICD-10-CM

## 2021-02-04 DIAGNOSIS — K5900 Constipation, unspecified: Secondary | ICD-10-CM | POA: Diagnosis not present

## 2021-02-04 DIAGNOSIS — C342 Malignant neoplasm of middle lobe, bronchus or lung: Secondary | ICD-10-CM

## 2021-02-04 DIAGNOSIS — Z9114 Patient's other noncompliance with medication regimen: Secondary | ICD-10-CM | POA: Diagnosis not present

## 2021-02-04 LAB — COMPREHENSIVE METABOLIC PANEL
ALT: 15 U/L (ref 0–44)
AST: 18 U/L (ref 15–41)
Albumin: 4.3 g/dL (ref 3.5–5.0)
Alkaline Phosphatase: 63 U/L (ref 38–126)
Anion gap: 7 (ref 5–15)
BUN: 13 mg/dL (ref 8–23)
CO2: 26 mmol/L (ref 22–32)
Calcium: 9.1 mg/dL (ref 8.9–10.3)
Chloride: 105 mmol/L (ref 98–111)
Creatinine, Ser: 0.52 mg/dL (ref 0.44–1.00)
GFR, Estimated: >60 mL/min (ref >60)
Glucose, Bld: 101 mg/dL — ABNORMAL HIGH (ref 70–99)
Potassium: 3.5 mmol/L (ref 3.5–5.1)
Sodium: 138 mmol/L (ref 135–145)
Total Bilirubin: 0.9 mg/dL (ref 0.3–1.2)
Total Protein: 7.3 g/dL (ref 6.5–8.1)

## 2021-02-04 LAB — CBC WITH DIFFERENTIAL/PLATELET
Abs Immature Granulocytes: 0.02 10*3/uL (ref 0.00–0.07)
Basophils Absolute: 0 10*3/uL (ref 0.0–0.1)
Basophils Relative: 1 %
Eosinophils Absolute: 0.1 10*3/uL (ref 0.0–0.5)
Eosinophils Relative: 1 %
HCT: 35.1 % — ABNORMAL LOW (ref 36.0–46.0)
Hemoglobin: 11.8 g/dL — ABNORMAL LOW (ref 12.0–15.0)
Immature Granulocytes: 0 %
Lymphocytes Relative: 24 %
Lymphs Abs: 1.5 10*3/uL (ref 0.7–4.0)
MCH: 31.5 pg (ref 26.0–34.0)
MCHC: 33.6 g/dL (ref 30.0–36.0)
MCV: 93.6 fL (ref 80.0–100.0)
Monocytes Absolute: 0.4 10*3/uL (ref 0.1–1.0)
Monocytes Relative: 6 %
Neutro Abs: 4.4 10*3/uL (ref 1.7–7.7)
Neutrophils Relative %: 68 %
Platelets: 197 10*3/uL (ref 150–400)
RBC: 3.75 MIL/uL — ABNORMAL LOW (ref 3.87–5.11)
RDW: 13.2 % (ref 11.5–15.5)
WBC: 6.4 10*3/uL (ref 4.0–10.5)
nRBC: 0 % (ref 0.0–0.2)

## 2021-02-04 MED ORDER — OXYCODONE HCL 5 MG PO TABS: 5.0000 mg | ORAL_TABLET | Freq: Four times a day (QID) | ORAL | 0 refills | Status: DC | PRN

## 2021-02-04 NOTE — Progress Notes (Signed)
Symptom Management [place] at [facility] Telephone:([phone]) [phone] Fax:([phone]) [phone]  Patient Care Team: Services, 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  She was then treated with osimertinib but this was ultimately held due to poor tolerance with side effects including nausea and dizziness.  Patient last saw Sara Schroeder on 08/15/2020 with plan for MRI of the brain and CT of the chest with 86-monthfollow-up.  It appears that patient ultimately canceled her scans in July 2022.  Patient was seen in the ED on 10/31/2020 for insomnia and was discharged home on as needed lorazepam.  It appears that there has been difficulty with getting patient to follow-up in clinic as well as having consistent dosing/refills of Tagrisso.  Patient was last scheduled to see Dr. BRogue Bussingon 12/16/2020 but patient missed this appointment.  Son called yesterday asking for patient to be seen due to worsening back pain.  Patient/son report progressive low back pain over the past month also with poor appetite, weight loss, and intermittent abdominal pain.  Patient is also declining performance status and is requiring more care from family.  No falls or trauma.  Denies any neurologic complaints. Denies recent fevers or illnesses. Denies any easy bleeding or bruising. Denies chest pain. Denies any nausea, vomiting, constipation, or diarrhea. Denies urinary complaints. Patient offers no further specific complaints today.  PAST MEDICAL HISTORY: Past Medical History:  Diagnosis Date    Anxiety    Hypertension    [place]    Right Middle Lobe    PAST SURGICAL HISTORY:  Past Surgical History:  Procedure Laterality Date   APPLICATION OF CRANIAL NAVIGATION N/A 06/29/2019   Procedure: APPLICATION OF CRANIAL NAVIGATION;  Surgeon: Sara Part MD;  [facility]  Service: Neurosurgery;  Laterality: N/A;   CRANIOTOMY Left 06/29/2019   Procedure: LEFT CRANIOTOMY FOR TUMOR EXCISION;  Surgeon: Sara Part MD;  [facility]  Service: Neurosurgery;  Laterality: Left;    HEMATOLOGY/ONCOLOGY HISTORY:  Oncology History Overview Note  # MAY 2021- [[place]; [facility]]- MRI of the brain-2.6 cm left lateral frontal mass compatible with hemorrhagic neoplasm.  A CT of the chest, abdomen, pelvis without contrast was performed on 06/27/2019 which showed a spiculated right middle lobe pulmonary nodule measuring 11 x 11 x 10 mm.  This had been present on a CT scan performed on 03/01/2018.  There was no evidence of metastatic disease or primary malignancy in the chest, abdomen, pelvis.  Additionally, the patient had a exophytic 161mnodule from the thyroid isthmus.  The patient underwent a left frontal craniotomy for tumor resection on 06/29/2019.  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  MEDICATIONS:  Current Outpatient Medications  Medication Sig Dispense Refill    acetaminophen (TYLENOL) 325 MG tablet Take 2 tablets (650 mg total) by mouth every 4 (four) hours as needed for mild pain (temp > 101.5). (Patient not taking: No sig reported)     docusate sodium (COLACE) 100 MG capsule Take 1 capsule (100 mg total) by mouth 2 (two) times daily. 10 capsule 0   LORazepam (ATIVAN) 1 MG tablet Take 1 tablet (1 mg total) by mouth every 8 (eight) hours as needed for anxiety or sleep. 15 tablet 0   Multiple Vitamins-Minerals (CENTRUM SILVER 50+WOMEN PO) Take by mouth. (Patient not taking: Reported on 08/15/2020)     omeprazole (PRILOSEC) 20 MG capsule Take 1 capsule (20 mg total) by mouth daily. 30 capsule 0   traZODone (DESYREL) 50 MG tablet Take 2 tablets (100 mg total) by mouth at bedtime. 30 tablet 0   No current facility-administered medications for this visit.    VITAL SIGNS: There were no vitals taken for this visit. There were no vitals filed for this visit.  Estimated body mass index is 18.89 kg/m as calculated from the following:   Height as of 10/31/20: _0  (1.549 m).   Weight as of 10/31/20: 100 lb (45.4 kg).  LABS: CBC:    Component Value Date/Time   WBC 10.1 10/31/2020 0625   HGB 14.0 10/31/2020 0625   HCT 41.5 10/31/2020 0625   PLT 217 10/31/2020 0625   MCV 93.0 10/31/2020 0625   NEUTROABS 8.0 (H) 10/31/2020 0625   LYMPHSABS 1.5 10/31/2020 0625   MONOABS 0.4 10/31/2020 0625   EOSABS 0.1 10/31/2020 0625   BASOSABS 0.1 10/31/2020 0625   Comprehensive Metabolic Panel:    Component Value Date/Time   NA 139 10/31/2020 0537   K 4.5 10/31/2020 0537   CL 103 10/31/2020 0537   CO2 27 10/31/2020 0537   BUN 11 10/31/2020 0537   CREATININE 0.79 10/31/2020 0537   GLUCOSE 103 (H) 10/31/2020 0537   CALCIUM 9.4 10/31/2020 0537   AST 35 10/31/2020 0537   ALT 12 10/31/2020 0537   ALKPHOS 76 10/31/2020 0537   BILITOT 1.4 (H) 10/31/2020 0537   PROT 7.9 10/31/2020 0537   ALBUMIN 4.5 10/31/2020 0537    RADIOGRAPHIC STUDIES: No results  found.  PERFORMANCE STATUS (ECOG) : 3 - Symptomatic, >50% confined to bed  Review of Systems Unless otherwise noted, a complete review of systems is negative.  Physical Exam General: NAD, thin Cardiovascular: regular rate and rhythm Pulmonary: clear ant fields Abdomen: soft, nontender, + bowel sounds GU: no suprapubic tenderness Extremities: no edema, no joint deformities Skin: no rashes Neurological: Weakness but otherwise nonfocal  Assessment and Plan- Patient is a 70 y.o. 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  She was then treated with osimertinib but this was ultimately held due to poor tolerance with side effects including nausea and dizziness.  Patient presents to [facility] for evaluation of back pain   Stage IV adenocarcinoma lung  -patient has history of poor compliance to treatment with multiple missed appointments/no-shows.  She was supposed to have repeat CTs in July but repeatedly canceled after attempts to reschedule imaging.  I have high suspicion that  her symptomatology (pain, weight loss, poor oral intake, and declining performance status) are secondary to disease progression.  I had a long conversation with patient/son (son interpreted) regarding goals.  I suspect that even if disease progression is proved, patient has likely demonstrated that she has not an ideal treatment candidate.  We discussed the possible future option of hospice.  Patient is interested in pursuing CTs but does not want these done today.  She does agree with labs today.  We will proceed with ordering imaging and plan for follow-up with Sara Schroeder when he returns to clinic.  We will also order home palliative care to facilitate coordination.  Back pain -Will order CTs to rule out neoplasm related process.  We will start oxycodone 5 mg every 6 hours as needed #45  Patient expressed understanding  and was in agreement with this plan. She also understands that She can call clinic at any time with any questions, concerns, or complaints.   Thank you for allowing me to participate in the care of this very pleasant patient.   Time Total: 30 minutes  Visit consisted of counseling and education dealing with the complex and emotionally intense issues of symptom management in the setting of serious illness.Greater than 50%  of this time was spent counseling and coordinating care related to the above assessment and plan.  Signed by: Altha Harm, PhD, NP-C

## 2021-02-04 NOTE — Progress Notes (Signed)
Per son, has had an increase in back pain and weakness. Reports difficulty walking, and intermittent shortness of breath. States that resting helps pain, but worsens with ambulation. Has tried Tylenol with little effectiveness.

## 2021-02-05 ENCOUNTER — Other Ambulatory Visit: Payer: Self-pay | Admitting: *Deleted

## 2021-02-05 DIAGNOSIS — C7931 Secondary malignant neoplasm of brain: Secondary | ICD-10-CM

## 2021-02-05 MED ORDER — ONDANSETRON HCL 4 MG PO TABS: 4.0000 mg | ORAL_TABLET | Freq: Four times a day (QID) | ORAL | 2 refills | Status: DC | PRN

## 2021-02-05 MED ORDER — OXYCODONE HCL 5 MG PO TABS: 5.0000 mg | ORAL_TABLET | Freq: Four times a day (QID) | ORAL | 0 refills | Status: AC | PRN

## 2021-02-05 NOTE — Telephone Encounter (Signed)
Pt's son stated that would like oxycodone to go to [facility] in [place] instead of [facility]. Spoke with [facility] to confirm that pt has not picked up the prescription and cancelled prescription at this time. New Rx will be [facility] [place] along with anti-emetics as requested by pt's son.

## 2021-02-06 ENCOUNTER — Telehealth: Payer: Self-pay

## 2021-02-06 NOTE — Telephone Encounter (Signed)
Sara Schroeder- liaison spoke with patient's son [name] and scheduled a Palliative Care consult for 02/13/21 @ 12 PM with Joellen Jersey NP.

## 2021-02-10 ENCOUNTER — Other Ambulatory Visit: Payer: Self-pay

## 2021-02-10 ENCOUNTER — Ambulatory Visit
Admission: RE | Admit: 2021-02-10 | Discharge: 2021-02-10 | Disposition: A | Discharge status: Home / Self Care | Payer: Medicare Other | Source: Ambulatory Visit | Attending: Hospice and Palliative Medicine | Admitting: Hospice and Palliative Medicine

## 2021-02-10 DIAGNOSIS — C342 Malignant neoplasm of middle lobe, bronchus or lung: Secondary | ICD-10-CM | POA: Insufficient documentation

## 2021-02-10 IMAGING — CT CT CHEST-ABD-PELV W/ CM
2 of 5 series · 13 of 36 positions shown, 15 images · IV contrast (omnipaque)
Comparison: Comparison is made with [DATE].

CLINICAL DATA: Non-small cell lung cancer staging evaluation.

EXAM:
CT CHEST, ABDOMEN, AND PELVIS WITH CONTRAST
TECHNIQUE: Multidetector CT imaging of the chest, abdomen and pelvis was
performed following the standard protocol during bolus
administration of intravenous contrast.
CONTRAST:  85mL OMNIPAQUE IOHEXOL 300 MG/ML  SOLN

[Series 2: axials cap 5.00 · axial · 0.62mm/px · z∈[-1385,-890]mm · 10 of 119 slices shown, 12 images]
[im 10/119  mediastinal]
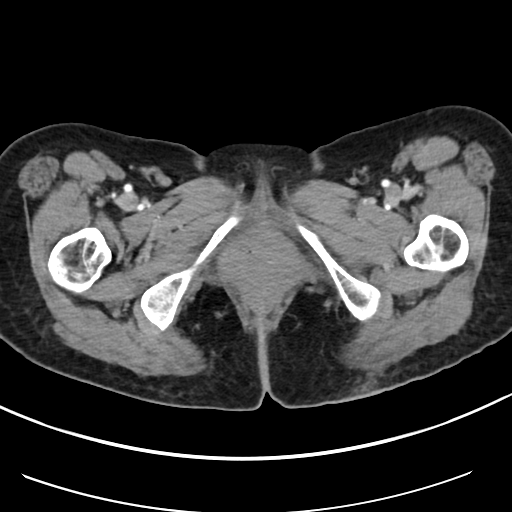
[im 10/119  bone]
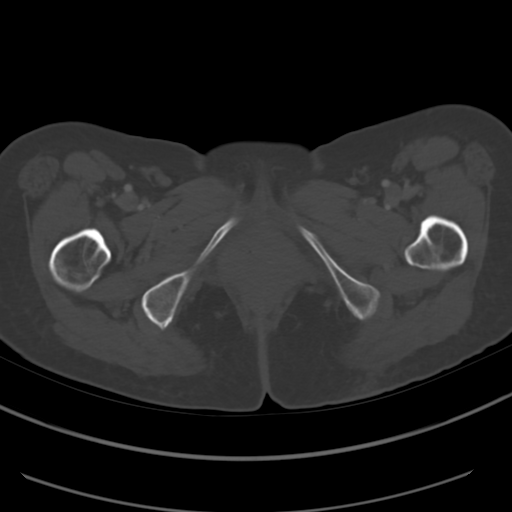
[im 20/119  mediastinal]
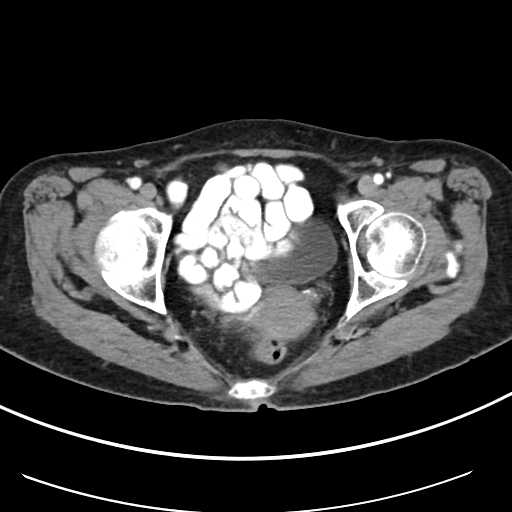
[im 30/119  mediastinal]
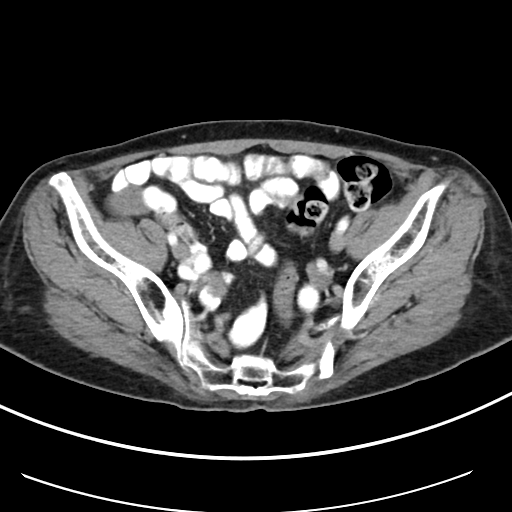
[im 40/119  mediastinal]
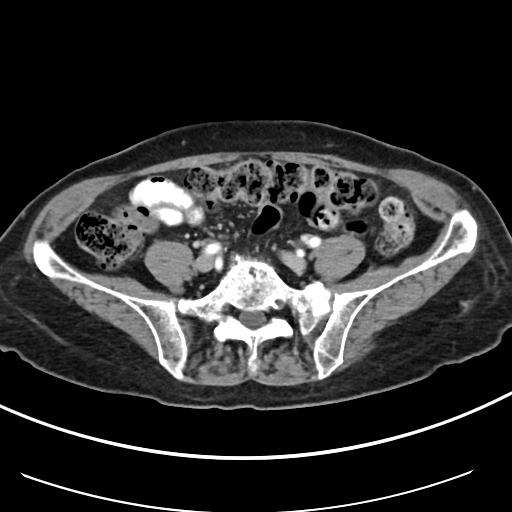
[im 50/119  mediastinal]
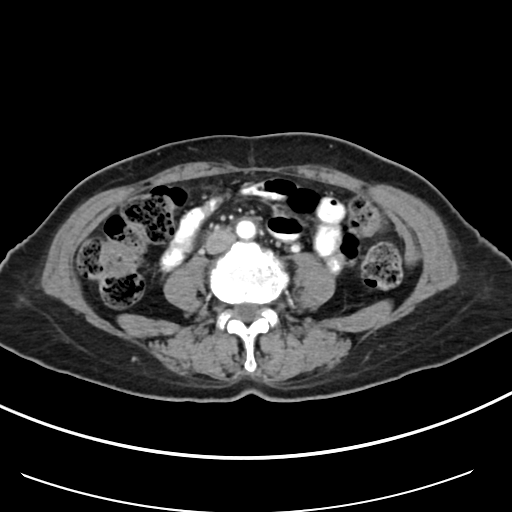
[im 69/119  mediastinal]
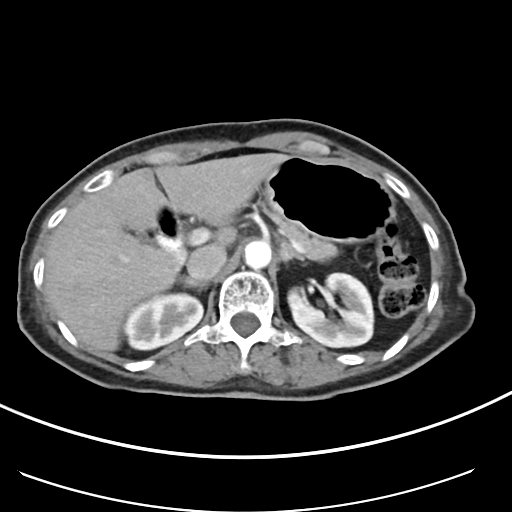
[im 79/119  mediastinal]
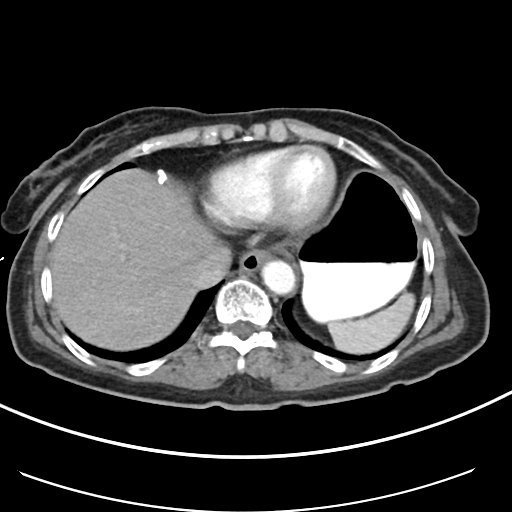
[im 89/119  mediastinal]
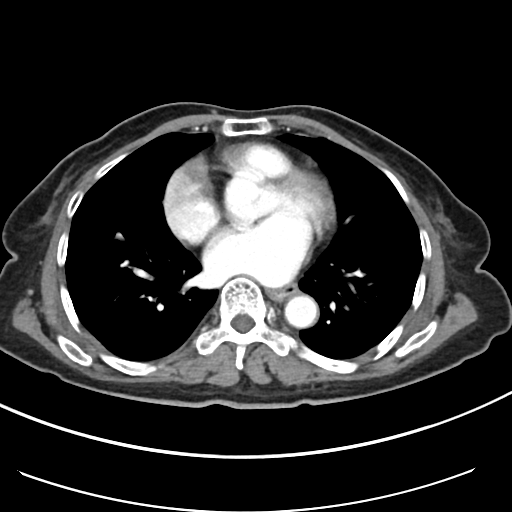
[im 99/119  mediastinal]
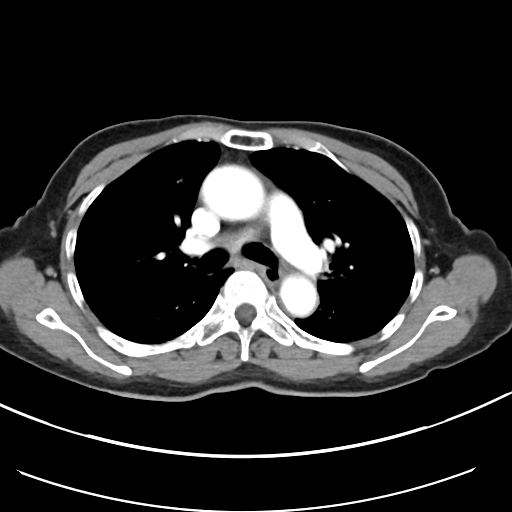
[im 99/119  bone]
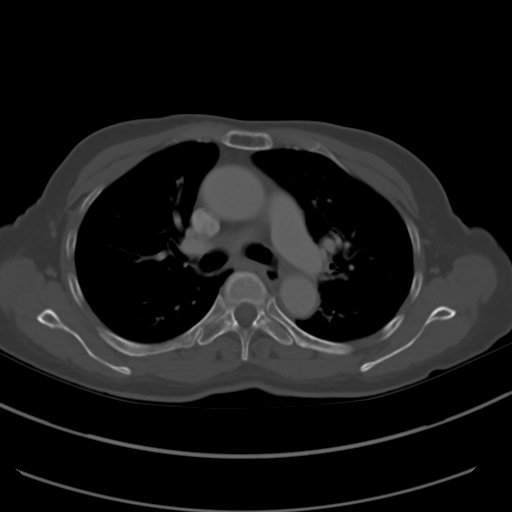
[im 109/119  mediastinal]
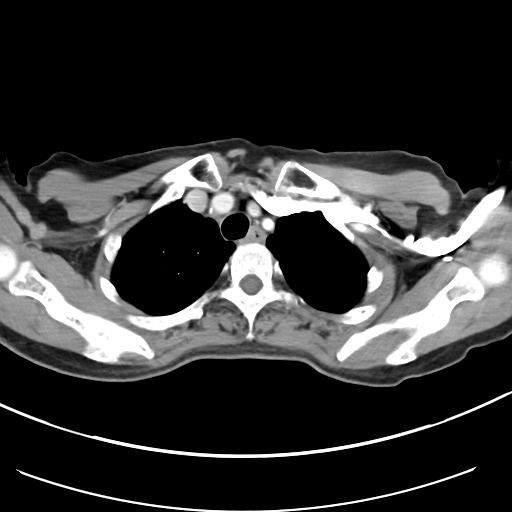

[Series 4: coronals cap 2.00 cor · coronal · 0.62mm/px · 3 of 99 slices shown]
[im 20/99  mediastinal]
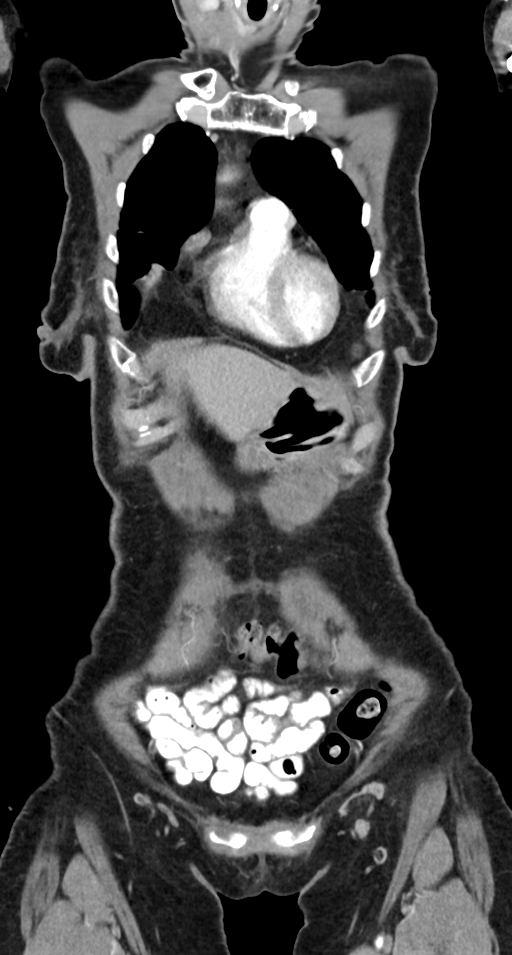
[im 40/99  mediastinal]
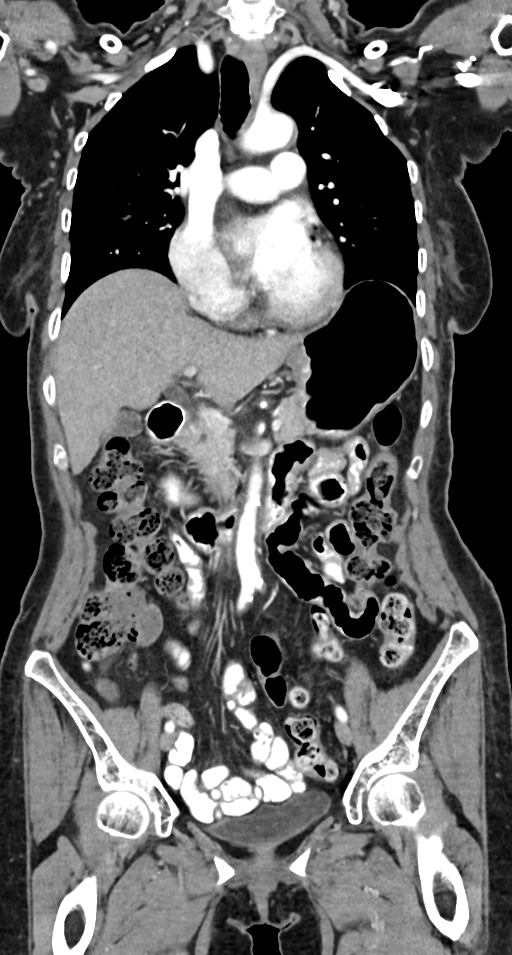
[im 59/99  mediastinal]
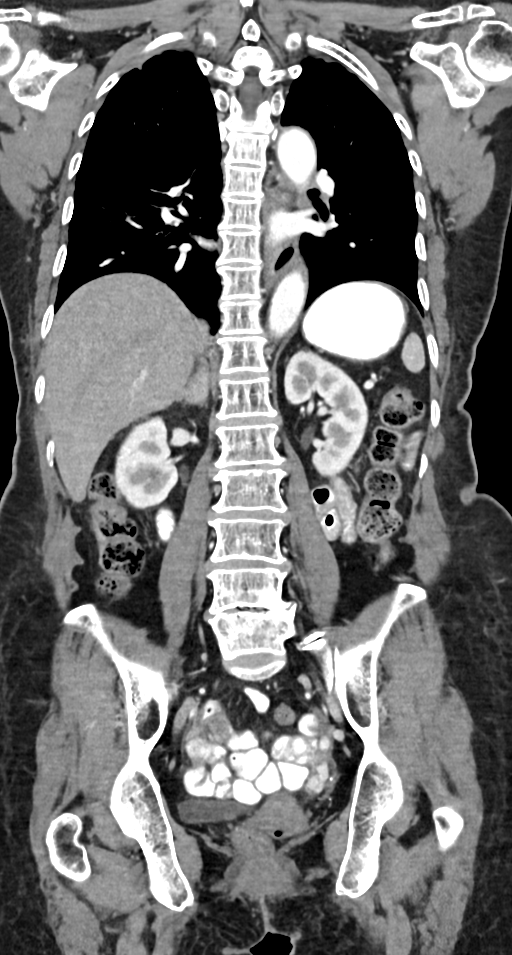

[13 of 36 positions shown; findings below may reference images not displayed]

FINDINGS: CT CHEST FINDINGS

Cardiovascular: Scattered aortic atherosclerosis. No aneurysmal
dilation. Heart size is normal without pericardial effusion. Central
pulmonary vasculature is normal caliber.

Mediastinum/Nodes: No thoracic inlet, axillary, mediastinal or hilar
adenopathy. Esophagus grossly normal.

Lungs/Pleura: Nodular and interstitial changes in the RIGHT middle
lobe with signs of RIGHT middle lobe scarring are similar to
previous imaging no discrete measurable nodule amidst bandlike
changes in the RIGHT middle lobe. No new or progressive findings
signs of lingular scarring as before. Airways are patent.

Musculoskeletal: See below for full musculoskeletal details.

CT ABDOMEN PELVIS FINDINGS

Hepatobiliary: Smooth hepatic contours. No pericholecystic
stranding. Mild biliary duct distension approximately 6 mm.

Pancreas: Small cystic lesion in the tail of the pancreas not
definitely seen previously. No ductal dilation or sign of
inflammation.

Spleen: Normal size and contour without focal lesion.

Adrenals/Urinary Tract:

Adrenal glands are unremarkable. Symmetric renal enhancement. No
sign of hydronephrosis. No suspicious renal lesion or perinephric
stranding.

Urinary bladder is grossly unremarkable.

Stomach/Bowel: No stranding adjacent to the stomach. mural
stratification along the lesser curvature of the stomach with
peristaltic activity versus mild distortion of the greater curvature
at the same level, some gastric antral thickening also suggested. No
acute small bowel process. Stool throughout the colon. Normal
appendix.

Vascular/Lymphatic:

Aortic atherosclerosis. Mild changes. No sign of aneurysm. Smooth
contour of the IVC. There is no gastrohepatic or hepatoduodenal
ligament lymphadenopathy. No retroperitoneal or mesenteric
lymphadenopathy.

No pelvic sidewall lymphadenopathy.

Reproductive: Unremarkable.  No adnexal masses.

Other: No ascites.

Musculoskeletal: No acute bone finding. No destructive bone process.
Spinal degenerative changes. Degenerative changes greatest at L4-5
as before. Osteopenia.
IMPRESSION: Post treatment changes in the RIGHT chest, no new or progressive
findings.

Question of gastric thickening without substantial perigastric
stranding. May reflect gastritis in the appropriate clinical setting
though is nonspecific. Consider correlation with symptoms and
dedicated gastric evaluation as warranted for further assessment.

Top-normal caliber of the biliary tree, common bile duct
approximately 6 mm. No visible obstructing lesion.

Cystic lesion in the tail of the pancreas measuring 7 mm (image
44/4) no main duct dilation or adjacent stranding. Could consider
dedicated MRI/MRCP in 1 year for follow-up or attention on
subsequent imaging obtained for follow-up if performed.

Aortic atherosclerosis.

## 2021-02-10 MED ORDER — IOHEXOL 300 MG/ML  SOLN
85.0000 mL | Freq: Once | INTRAMUSCULAR | Status: AC | PRN
  Administered 2021-02-10: 15:00:00 85 mL via INTRAVENOUS

## 2021-02-13 ENCOUNTER — Other Ambulatory Visit: Payer: Medicare Other | Admitting: Primary Care

## 2021-02-18 ENCOUNTER — Other Ambulatory Visit: Payer: Self-pay

## 2021-02-18 ENCOUNTER — Inpatient Hospital Stay (HOSPITAL_BASED_OUTPATIENT_CLINIC_OR_DEPARTMENT_OTHER): Payer: Medicare Other | Admitting: Internal Medicine

## 2021-02-18 ENCOUNTER — Encounter: Payer: Self-pay | Admitting: Internal Medicine

## 2021-02-18 ENCOUNTER — Inpatient Hospital Stay: Payer: Medicare Other

## 2021-02-18 DIAGNOSIS — C7931 Secondary malignant neoplasm of brain: Secondary | ICD-10-CM

## 2021-02-18 DIAGNOSIS — C342 Malignant neoplasm of middle lobe, bronchus or lung: Secondary | ICD-10-CM | POA: Diagnosis not present

## 2021-02-18 LAB — CBC WITH DIFFERENTIAL/PLATELET
Abs Immature Granulocytes: 0.02 10*3/uL (ref 0.00–0.07)
Basophils Absolute: 0.1 10*3/uL (ref 0.0–0.1)
Basophils Relative: 1 %
Eosinophils Absolute: 0.2 10*3/uL (ref 0.0–0.5)
Eosinophils Relative: 2 %
HCT: 35.2 % — ABNORMAL LOW (ref 36.0–46.0)
Hemoglobin: 11.6 g/dL — ABNORMAL LOW (ref 12.0–15.0)
Immature Granulocytes: 0 %
Lymphocytes Relative: 27 %
Lymphs Abs: 2 10*3/uL (ref 0.7–4.0)
MCH: 31.2 pg (ref 26.0–34.0)
MCHC: 33 g/dL (ref 30.0–36.0)
MCV: 94.6 fL (ref 80.0–100.0)
Monocytes Absolute: 0.5 10*3/uL (ref 0.1–1.0)
Monocytes Relative: 6 %
Neutro Abs: 4.8 10*3/uL (ref 1.7–7.7)
Neutrophils Relative %: 64 %
Platelets: 163 10*3/uL (ref 150–400)
RBC: 3.72 MIL/uL — ABNORMAL LOW (ref 3.87–5.11)
RDW: 13.4 % (ref 11.5–15.5)
WBC: 7.5 10*3/uL (ref 4.0–10.5)
nRBC: 0 % (ref 0.0–0.2)

## 2021-02-18 LAB — COMPREHENSIVE METABOLIC PANEL
ALT: 17 U/L (ref 0–44)
AST: 24 U/L (ref 15–41)
Albumin: 4.4 g/dL (ref 3.5–5.0)
Alkaline Phosphatase: 59 U/L (ref 38–126)
Anion gap: 10 (ref 5–15)
BUN: 10 mg/dL (ref 8–23)
CO2: 29 mmol/L (ref 22–32)
Calcium: 9.7 mg/dL (ref 8.9–10.3)
Chloride: 98 mmol/L (ref 98–111)
Creatinine, Ser: 0.72 mg/dL (ref 0.44–1.00)
GFR, Estimated: >60 mL/min (ref >60)
Glucose, Bld: 135 mg/dL — ABNORMAL HIGH (ref 70–99)
Potassium: 3.2 mmol/L — ABNORMAL LOW (ref 3.5–5.1)
Sodium: 137 mmol/L (ref 135–145)
Total Bilirubin: 0.7 mg/dL (ref 0.3–1.2)
Total Protein: 7.4 g/dL (ref 6.5–8.1)

## 2021-02-18 LAB — MAGNESIUM: Magnesium: 2.1 mg/dL (ref 1.7–2.4)

## 2021-02-18 MED ORDER — CENTRUM SILVER 50+WOMEN PO TABS: 1.0000 | ORAL_TABLET | Freq: Every day | ORAL | 4 refills | Status: AC

## 2021-02-18 MED ORDER — OMEPRAZOLE 20 MG PO CPDR: 20.0000 mg | DELAYED_RELEASE_CAPSULE | Freq: Every day | ORAL | 3 refills | Status: DC

## 2021-02-18 MED ORDER — ONDANSETRON HCL 4 MG PO TABS: 4.0000 mg | ORAL_TABLET | Freq: Four times a day (QID) | ORAL | 2 refills | Status: AC | PRN
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
## 2021-02-18 NOTE — Progress Notes (Signed)
Needs refill of omeprazole, zofran, multi vitamin and tagrisso. Having some constipation with the oxy, picked up some miralax to start prn. Has only had one bowel movement in a week since started oxy. Having some feelings of hopelessness due to her medical problems.
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
## 2021-02-27 ENCOUNTER — Ambulatory Visit: Payer: Medicare Other

## 2021-03-26 ENCOUNTER — Telehealth: Payer: Self-pay | Admitting: Primary Care

## 2021-03-26 NOTE — Telephone Encounter (Signed)
Spoke with patient's son Day Day, to f/u with him to see if they wanted to reschedule the Palliative Consult, they have declined Palliative services at this time and was in agreement with [place] cancelling this referral.  Son was instructed to contact patient's MD if they wish to pursue Palliative services in the future.

## 2021-04-22 ENCOUNTER — Inpatient Hospital Stay: Payer: Medicare Other | Attending: Internal Medicine

## 2021-04-22 ENCOUNTER — Other Ambulatory Visit: Payer: Self-pay

## 2021-04-22 ENCOUNTER — Inpatient Hospital Stay (HOSPITAL_BASED_OUTPATIENT_CLINIC_OR_DEPARTMENT_OTHER): Payer: Medicare Other | Admitting: Internal Medicine

## 2021-04-22 VITALS — BP 137/74 | HR 89 | Temp 98.7°F | Resp 18 | Wt 105.9 lb

## 2021-04-22 DIAGNOSIS — D649 Anemia, unspecified: Secondary | ICD-10-CM | POA: Insufficient documentation

## 2021-04-22 DIAGNOSIS — C342 Malignant neoplasm of middle lobe, bronchus or lung: Secondary | ICD-10-CM | POA: Diagnosis present

## 2021-04-22 DIAGNOSIS — F419 Anxiety disorder, unspecified: Secondary | ICD-10-CM | POA: Diagnosis not present

## 2021-04-22 DIAGNOSIS — R1013 Epigastric pain: Secondary | ICD-10-CM | POA: Insufficient documentation

## 2021-04-22 DIAGNOSIS — K5909 Other constipation: Secondary | ICD-10-CM | POA: Diagnosis not present

## 2021-04-22 DIAGNOSIS — G479 Sleep disorder, unspecified: Secondary | ICD-10-CM | POA: Diagnosis not present

## 2021-04-22 DIAGNOSIS — E876 Hypokalemia: Secondary | ICD-10-CM | POA: Insufficient documentation

## 2021-04-22 DIAGNOSIS — Z79899 Other long term (current) drug therapy: Secondary | ICD-10-CM | POA: Diagnosis not present

## 2021-04-22 DIAGNOSIS — C7931 Secondary malignant neoplasm of brain: Secondary | ICD-10-CM | POA: Diagnosis present

## 2021-04-22 LAB — CBC WITH DIFFERENTIAL/PLATELET
Abs Immature Granulocytes: 0.02 10*3/uL (ref 0.00–0.07)
Basophils Absolute: 0.1 10*3/uL (ref 0.0–0.1)
Basophils Relative: 1 %
Eosinophils Absolute: 0.1 10*3/uL (ref 0.0–0.5)
Eosinophils Relative: 2 %
HCT: 35 % — ABNORMAL LOW (ref 36.0–46.0)
Hemoglobin: 11.6 g/dL — ABNORMAL LOW (ref 12.0–15.0)
Immature Granulocytes: 0 %
Lymphocytes Relative: 21 %
Lymphs Abs: 1.5 10*3/uL (ref 0.7–4.0)
MCH: 31.1 pg (ref 26.0–34.0)
MCHC: 33.1 g/dL (ref 30.0–36.0)
MCV: 93.8 fL (ref 80.0–100.0)
Monocytes Absolute: 0.4 10*3/uL (ref 0.1–1.0)
Monocytes Relative: 6 %
Neutro Abs: 4.9 10*3/uL (ref 1.7–7.7)
Neutrophils Relative %: 70 %
Platelets: 163 10*3/uL (ref 150–400)
RBC: 3.73 MIL/uL — ABNORMAL LOW (ref 3.87–5.11)
RDW: 13.8 % (ref 11.5–15.5)
WBC: 6.9 10*3/uL (ref 4.0–10.5)
nRBC: 0 % (ref 0.0–0.2)

## 2021-04-22 LAB — COMPREHENSIVE METABOLIC PANEL
ALT: 28 U/L (ref 0–44)
AST: 28 U/L (ref 15–41)
Albumin: 4.4 g/dL (ref 3.5–5.0)
Alkaline Phosphatase: 76 U/L (ref 38–126)
Anion gap: 8 (ref 5–15)
BUN: 13 mg/dL (ref 8–23)
CO2: 26 mmol/L (ref 22–32)
Calcium: 9.4 mg/dL (ref 8.9–10.3)
Chloride: 103 mmol/L (ref 98–111)
Creatinine, Ser: 0.81 mg/dL (ref 0.44–1.00)
GFR, Estimated: >60 mL/min (ref >60)
Glucose, Bld: 113 mg/dL — ABNORMAL HIGH (ref 70–99)
Potassium: 3.4 mmol/L — ABNORMAL LOW (ref 3.5–5.1)
Sodium: 137 mmol/L (ref 135–145)
Total Bilirubin: 0.4 mg/dL (ref 0.3–1.2)
Total Protein: 7.4 g/dL (ref 6.5–8.1)

## 2021-04-22 LAB — IRON AND TIBC
Iron: 49 ug/dL (ref 28–170)
Saturation Ratios: 13 % (ref 10.4–31.8)
TIBC: 370 ug/dL (ref 250–450)
UIBC: 321 ug/dL

## 2021-04-22 LAB — MAGNESIUM: Magnesium: 2.1 mg/dL (ref 1.7–2.4)

## 2021-04-22 LAB — FERRITIN: Ferritin: 113 ng/mL (ref 11–307)

## 2021-04-22 NOTE — Progress Notes (Signed)
[name], [place] as PCP - General Sara Nab, [name], [name]  CHIEF COMPLAINTS/PURPOSE OF CONSULTATION: lung cancer  #  Oncology History Overview Note  # MAY 2021[facility]; Dr.Ennver/Mohmad]- MRI of the brain-2.6 cm left lateral frontal mass compatible with hemorrhagic neoplasm.  A CT of the chest, abdomen, pelvis without contrast was performed on 06/27/2019 which showed a spiculated right middle lobe pulmonary nodule measuring 11 x 11 x 10 mm.  This had been present on a CT scan performed on 03/01/2018.  There was no evidence of metastatic disease or primary malignancy in the chest, abdomen, pelvis.  Additionally, the patient had a exophytic 71mm nodule from the thyroid isthmus.  The patient underwent a left frontal craniotomy for tumor resection on 06/29/2019.  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  Accompanied by son.  In a wheelchair.  Sara Schroeder 70 y.o.  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  Occasional difficulty sleeping and they have noticed that she has more energy with  more sleep.  Does take Trazodone every night.  Chronic mild constipation-improved with Colace.   Has dyspepsia after eating spicy foods.  Ambulates with walker in house.  Denies any blood in stools or black or stools.   Review of Systems  Constitutional:  Positive for malaise/fatigue. Negative for chills, diaphoresis and fever.  HENT:  Negative for nosebleeds and sore throat.   Eyes:  Negative for double vision.  Respiratory:  Negative for cough, hemoptysis, sputum production and wheezing.   Cardiovascular:  Negative for chest pain, palpitations, orthopnea and leg swelling.  Gastrointestinal:  Positive for constipation and nausea. Negative for abdominal pain, blood in stool, diarrhea, heartburn, melena and vomiting.  Genitourinary:  Negative for dysuria, frequency and urgency.  Musculoskeletal:  Positive for back pain and joint pain.  Skin: Negative.  Negative for itching and rash.  Neurological:  Positive for dizziness. Negative for tingling, focal weakness, weakness and headaches.  Endo/Heme/Allergies:  Does not bruise/bleed easily.  Psychiatric/Behavioral:  Negative for depression. The patient is not nervous/anxious and does not have insomnia.     MEDICAL HISTORY:  Past Medical History:  Diagnosis Date   Anxiety    Hypertension    Lung cancer ([place])    Right Middle Lobe    SURGICAL HISTORY: Past Surgical History:  Procedure Laterality Date   APPLICATION OF CRANIAL NAVIGATION N/A 06/29/2019   Procedure: APPLICATION OF CRANIAL NAVIGATION;  Surgeon: Judith Part, MD;  Location: [facility];  Service: Neurosurgery;  Laterality: N/A;   CRANIOTOMY Left 06/29/2019   Procedure: LEFT CRANIOTOMY FOR TUMOR EXCISION;  Surgeon:  Judith Part, MD;  Location: [facility];  Service: Neurosurgery;  Laterality: Left;    SOCIAL HISTORY: Social History   Socioeconomic History   Marital status: Married    Spouse name: Not on file   Number of children: Not on file   Years of education: Not on  file   Highest education level: Not on file  Occupational History   Not on file  Tobacco Use   Smoking status: Never   Smokeless tobacco: Never  Vaping Use   Vaping Use: Never used  Substance and Sexual Activity   Alcohol use: Never   Drug use: Never   Sexual activity: Not Currently    Birth control/protection: Post-menopausal  Other Topics Concern   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  She lives with her children in [place].   Social Determinants of Health   Financial Resource Strain: Not on file  Food Insecurity: Not on file  Transportation Needs: Not on file  Physical Activity: Not on file  Stress: Not on file  Social Connections: Not on file  Intimate Partner Violence: Not on file    FAMILY HISTORY: Family History  Problem Relation Age of Onset   Cancer Son        some type of cancer involving the brain    ALLERGIES:  is allergic to lisinopril.  MEDICATIONS:  Current Outpatient Medications  Medication Sig Dispense Refill   acetaminophen (TYLENOL) 325 MG tablet Take 2 tablets (650 mg total) by mouth every 4 (four) hours as needed for mild pain (temp > 101.5).     docusate sodium (COLACE) 100 MG capsule Take 1 capsule (100 mg total) by mouth 2 (two) times daily. 10 capsule 0   Multiple Vitamins-Minerals (CENTRUM SILVER 50+WOMEN) TABS Take 1 tablet by mouth daily. 30 tablet 4   omeprazole (PRILOSEC) 20 MG capsule Take 1 capsule (20 mg total) by mouth daily. 30 capsule 3   osimertinib mesylate (TAGRISSO) 40 MG tablet Take 40 mg by mouth daily.     osimertinib mesylate (TAGRISSO) 40 MG tablet Take by mouth daily.     propranolol (INDERAL) 10 MG tablet Take 10 mg by mouth 2 (two) times daily.     traZODone (DESYREL) 50 MG tablet Take 2 tablets (100 mg total) by mouth at bedtime. 30 tablet 0   FLUoxetine (PROZAC) 20 MG tablet Take 20 mg by mouth daily.     hydrochlorothiazide (HYDRODIURIL) 25  MG tablet Take 25 mg by mouth daily.     LORazepam (ATIVAN) 1 MG tablet Take 1 tablet (1 mg total) by mouth every 8 (eight) hours as needed for anxiety or sleep. (Patient not taking: Reported on 02/18/2021) 15 tablet 0   ondansetron (ZOFRAN) 4 MG tablet Take 1 tablet (4 mg total) by mouth every 6 (six) hours as needed for nausea or vomiting. (Patient not taking: Reported on 04/22/2021) 20 tablet 2   oxyCODONE (OXY IR/ROXICODONE) 5 MG immediate release tablet Take 1 tablet (5 mg total) by mouth every 6 (six) hours as needed for severe pain. (Patient not taking: Reported on 04/22/2021) 45 tablet 0   No current facility-administered medications for this visit.      Marland Kitchen  PHYSICAL EXAMINATION: ECOG PERFORMANCE STATUS: 2 - Symptomatic, <50% confined to bed  Vitals:   04/22/21 1400  BP: 137/74  Pulse: 89  Resp: 18  Temp: 98.7 F (37.1 C)   Filed Weights  04/22/21 1400  Weight: 105 lb 14.4 oz (48 kg)    Physical Exam HENT:     Head: Normocephalic and atraumatic.     Mouth/Throat:     Pharynx: No oropharyngeal exudate.  Eyes:     Pupils: Pupils are equal, round, and reactive to light.  Cardiovascular:     Rate and Rhythm: Normal rate and regular rhythm.  Pulmonary:     Effort: Pulmonary effort is normal. No respiratory distress.     Breath sounds: Normal breath sounds. No wheezing.  Abdominal:     General: Bowel sounds are normal. There is no distension.     Palpations: Abdomen is soft. There is no mass.     Tenderness: There is no abdominal tenderness. There is no guarding or rebound.  Musculoskeletal:        General: No tenderness. Normal range of motion.     Cervical back: Normal range of motion and neck supple.  Skin:    General: Skin is warm.  Neurological:     Mental Status: She is alert and oriented to person, place, and time.  Psychiatric:        Mood and Affect: Affect normal.     LABORATORY DATA:  I have reviewed the data as listed Lab Results  Component Value  Date   WBC 6.9 04/22/2021   HGB 11.6 (L) 04/22/2021   HCT 35.0 (L) 04/22/2021   MCV 93.8 04/22/2021   PLT 163 04/22/2021   Recent Labs    02/04/21 1521 02/18/21 1410 04/22/21 1417  NA 138 137 137  K 3.5 3.2* 3.4*  CL 105 98 103  CO2 _0 GLUCOSE 101* 135* 113*  BUN _1 CREATININE 0.52 0.72 0.81  CALCIUM 9.1 9.7 9.4  GFRNONAA >60 >60 >60  PROT 7.3 7.4 7.4  ALBUMIN 4.3 4.4 4.4  AST _2 ALT _3 ALKPHOS 63 59 76  BILITOT 0.9 0.7 0.4    RADIOGRAPHIC STUDIES: I have personally reviewed the radiological images as listed and agreed with the findings in the report. No results found.  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 Currently Osimertinib 40 mg/day. DEC 20th, 2022- Post treatment changes in the RIGHT chest, no new or progressive.  # Continue osimertinib 40 mg daily- see below [poor tolerance to 80 mg/day].  Again stressed the importance of compliance. Will plan imaging in 2 months.   # Constipation:chronic recommend continue dulcolax/ miralax.   # Hypokalemia- 3.4- STABLE- ; recommend dietary supplementation.  # Anemia- Hb 11.6- monitor for now; if worse would recommend EGD if worse [given CT dec 2022- gatsric thickening].check Iron/ferritin. Discussed Dietary supplementations.   # Anxiety/Insomni- recommend continue Melatonin/ Fluoxetine   # Dyspepsia-with spicy foods.  ? Gastritis- recommend tums prn.    # Cystic lesion in the tail of the pancreas measuring 7 mm  no main duct dilation or adjacent stranding. Could consider dedicated MRI/MRCP in 1 year for follow-up.  # Brain metastases-status post resection followed by SBRT.  MRI AUG 2022- No progression.  Monitor for now; ? Repeat in 6 months-   # DISPOSITION: # Check iron studies/ferritin # follow up in 1st week of MAY - MD: labs-  cbc/cmp.mag;CTAP -Dr.B     All questions were answered. The patient knows to call the clinic with any problems, questions  or concerns.   Cammie Sickle, MD 04/22/2021 3:53 PM

## 2021-04-22 NOTE — Progress Notes (Signed)
Patient is accompanied by her son today that interpreters for check in process.  Will use interpreter services when MD comes in for visit.  Occasional difficulty sleeping and they have noticed that she has more energy with more sleep.  Does take Trazodone every night.  Blood pressure medication added to current medication list from [facility].  She states her bp is only elevated on the days after a night with no sleep, did take bp med yesterday (bp 137/74 in office)  Has constipation that is relieved with Colace but has been out for a while.  Has low abdominal pain after eating certain foods.

## 2021-04-22 NOTE — Assessment & Plan Note (Addendum)
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 Currently Osimertinib 40 mg/day. DEC 20th, 2022- Post treatment changes in the RIGHT chest, no new or progressive.  # Continue osimertinib 40 mg daily- see below [poor tolerance to 80 mg/day].  Again stressed the importance of compliance. Will plan imaging in 2 months.   # Constipation:chronic recommend continue dulcolax/ miralax.   # Hypokalemia- 3.4- STABLE- ; recommend dietary supplementation.  # Anemia- Hb 11.6- monitor for now; if worse would recommend EGD if worse [given CT dec 2022- gatsric thickening].check Iron/ferritin. Discussed Dietary supplementations.   # Anxiety/Insomni- recommend continue Melatonin/ Fluoxetine   # Dyspepsia-with spicy foods.  ? Gastritis- recommend tums prn.   # Cystic lesion in the tail of the pancreas measuring 7 mm  no main duct dilation or adjacent stranding. Could consider dedicated MRI/MRCP in 1 year for follow-up.  # Brain metastases-status post resection followed by SBRT.  MRI AUG 2022- No progression.  Monitor for now; ? Repeat in 6 months-   # DISPOSITION: # Check iron studies/ferritin # follow up in 1st week of MAY - MD: labs- cbc/cmp.mag;CTAP -Dr.B

## 2021-05-16 ENCOUNTER — Other Ambulatory Visit: Payer: Self-pay | Admitting: Internal Medicine

## 2021-06-20 ENCOUNTER — Telehealth: Payer: Self-pay | Admitting: *Deleted

## 2021-06-20 NOTE — Telephone Encounter (Signed)
Pt's son left message stating that pt is not feeling well with body aches and fever and would like to cancel her appt for CT scan and follow up next week. Pt's son stated that he will call back to reschedule once pt is feeling better.  ?

## 2021-06-25 ENCOUNTER — Ambulatory Visit: Payer: Medicare Other

## 2021-06-27 ENCOUNTER — Ambulatory Visit: Payer: Medicare Other | Admitting: Internal Medicine

## 2021-06-27 ENCOUNTER — Other Ambulatory Visit: Payer: Medicare Other

## 2021-08-14 ENCOUNTER — Telehealth: Payer: Self-pay | Admitting: *Deleted

## 2021-08-14 ENCOUNTER — Other Ambulatory Visit (HOSPITAL_COMMUNITY): Payer: Self-pay

## 2021-08-14 NOTE — Telephone Encounter (Signed)
Per pt's son, pt is out of her medication for Tagrisso and is requesting a refill. Pt has no follow up appts scheduled at this time. Pt cancelled her last appt for CT scan and has not rescheduled either.   Please advise.

## 2021-08-25 ENCOUNTER — Ambulatory Visit
Admission: RE | Admit: 2021-08-25 | Discharge: 2021-08-25 | Disposition: A | Discharge status: Home / Self Care | Payer: Medicare Other | Source: Ambulatory Visit | Attending: Internal Medicine | Admitting: Internal Medicine

## 2021-08-25 DIAGNOSIS — C342 Malignant neoplasm of middle lobe, bronchus or lung: Secondary | ICD-10-CM | POA: Insufficient documentation

## 2021-08-25 LAB — POCT I-STAT CREATININE: Creatinine, Ser: 0.6 mg/dL (ref 0.44–1.00)

## 2021-08-25 MED ORDER — IOHEXOL 300 MG/ML  SOLN
75.0000 mL | Freq: Once | INTRAMUSCULAR | Status: AC | PRN
  Administered 2021-08-25: 75 mL via INTRAVENOUS

## 2021-09-10 ENCOUNTER — Telehealth: Payer: Self-pay | Admitting: *Deleted

## 2021-09-10 ENCOUNTER — Inpatient Hospital Stay: Payer: Medicare Other

## 2021-09-10 ENCOUNTER — Inpatient Hospital Stay: Payer: Medicare Other | Admitting: Internal Medicine

## 2021-09-10 NOTE — Telephone Encounter (Signed)
Pt's son, Day, called to cancel pt's appt today. States that she is not feeling well. Requests call back to reschedule her appt - [phone]

## 2021-09-25 ENCOUNTER — Telehealth: Payer: Self-pay | Admitting: Pharmacy Technician

## 2021-09-25 ENCOUNTER — Inpatient Hospital Stay (HOSPITAL_BASED_OUTPATIENT_CLINIC_OR_DEPARTMENT_OTHER): Payer: Medicare Other | Admitting: Internal Medicine

## 2021-09-25 ENCOUNTER — Encounter: Payer: Self-pay | Admitting: Internal Medicine

## 2021-09-25 ENCOUNTER — Other Ambulatory Visit (HOSPITAL_COMMUNITY): Payer: Self-pay

## 2021-09-25 ENCOUNTER — Inpatient Hospital Stay: Payer: Medicare Other | Attending: Internal Medicine

## 2021-09-25 DIAGNOSIS — C7931 Secondary malignant neoplasm of brain: Secondary | ICD-10-CM | POA: Insufficient documentation

## 2021-09-25 DIAGNOSIS — K869 Disease of pancreas, unspecified: Secondary | ICD-10-CM | POA: Diagnosis not present

## 2021-09-25 DIAGNOSIS — C342 Malignant neoplasm of middle lobe, bronchus or lung: Secondary | ICD-10-CM | POA: Insufficient documentation

## 2021-09-25 DIAGNOSIS — D649 Anemia, unspecified: Secondary | ICD-10-CM | POA: Diagnosis not present

## 2021-09-25 DIAGNOSIS — Z79899 Other long term (current) drug therapy: Secondary | ICD-10-CM | POA: Insufficient documentation

## 2021-09-25 LAB — COMPREHENSIVE METABOLIC PANEL
ALT: 25 U/L (ref 0–44)
AST: 28 U/L (ref 15–41)
Albumin: 4.5 g/dL (ref 3.5–5.0)
Alkaline Phosphatase: 74 U/L (ref 38–126)
Anion gap: 5 (ref 5–15)
BUN: 13 mg/dL (ref 8–23)
CO2: 28 mmol/L (ref 22–32)
Calcium: 9.5 mg/dL (ref 8.9–10.3)
Chloride: 106 mmol/L (ref 98–111)
Creatinine, Ser: 0.61 mg/dL (ref 0.44–1.00)
GFR, Estimated: >60 mL/min (ref >60)
Glucose, Bld: 89 mg/dL (ref 70–99)
Potassium: 4 mmol/L (ref 3.5–5.1)
Sodium: 139 mmol/L (ref 135–145)
Total Bilirubin: 0.7 mg/dL (ref 0.3–1.2)
Total Protein: 7.6 g/dL (ref 6.5–8.1)

## 2021-09-25 LAB — CBC WITH DIFFERENTIAL/PLATELET
Abs Immature Granulocytes: 0.02 10*3/uL (ref 0.00–0.07)
Basophils Absolute: 0.1 10*3/uL (ref 0.0–0.1)
Basophils Relative: 1 %
Eosinophils Absolute: 0.2 10*3/uL (ref 0.0–0.5)
Eosinophils Relative: 3 %
HCT: 36.5 % (ref 36.0–46.0)
Hemoglobin: 11.8 g/dL — ABNORMAL LOW (ref 12.0–15.0)
Immature Granulocytes: 0 %
Lymphocytes Relative: 27 %
Lymphs Abs: 1.8 10*3/uL (ref 0.7–4.0)
MCH: 31.1 pg (ref 26.0–34.0)
MCHC: 32.3 g/dL (ref 30.0–36.0)
MCV: 96.1 fL (ref 80.0–100.0)
Monocytes Absolute: 0.4 10*3/uL (ref 0.1–1.0)
Monocytes Relative: 6 %
Neutro Abs: 4.1 10*3/uL (ref 1.7–7.7)
Neutrophils Relative %: 63 %
Platelets: 193 10*3/uL (ref 150–400)
RBC: 3.8 MIL/uL — ABNORMAL LOW (ref 3.87–5.11)
RDW: 13.4 % (ref 11.5–15.5)
WBC: 6.5 10*3/uL (ref 4.0–10.5)
nRBC: 0 % (ref 0.0–0.2)

## 2021-09-25 LAB — MAGNESIUM: Magnesium: 2.3 mg/dL (ref 1.7–2.4)

## 2021-09-25 MED ORDER — OSIMERTINIB MESYLATE 40 MG PO TABS
40.0000 mg | ORAL_TABLET | Freq: Every day | ORAL | 3 refills | Status: DC
  Filled 2021-09-25 (×2): qty 30, 30d supply, fill #0
  Filled 2021-10-17: qty 30, 30d supply, fill #1

## 2021-09-25 NOTE — Progress Notes (Signed)
[name]  Patient Care Team: Services, [place] as PCP - General Sara Nab, [name], [name]  CHIEF COMPLAINTS/PURPOSE OF CONSULTATION: lung cancer  #  Oncology History Overview Note  # MAY 2021[facility]; Dr.Ennver/Mohmad]- MRI of the brain-2.6 cm left lateral frontal mass compatible with hemorrhagic neoplasm.  A CT of the chest, abdomen, pelvis without contrast was performed on 06/27/2019 which showed a spiculated right middle lobe pulmonary nodule measuring 11 x 11 x 10 mm.  This had been present on a CT scan performed on 03/01/2018.  There was no evidence of metastatic disease or primary malignancy in the chest, abdomen, pelvis.  Additionally, the patient had a exophytic 68m nodule from the thyroid isthmus.  The patient underwent a left frontal craniotomy for tumor resection on 06/29/2019.  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  Patient does not speak [place]  Accompanied by son, Sara Schroeder.  In a wheelchair.  Sara Schroeder 70 y.o.  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  Patient stated she did not follow-up in  July after the imaging because of elevated blood pressure issues.  She has been off the osimertinib for the last 2 months or so as this was not refilled.  Denies any worsening headaches.  Any nausea vomiting but denies any shortness of breath or cough.   Review of Systems  Constitutional:  Positive for malaise/fatigue. Negative for chills, diaphoresis and fever.  HENT:  Negative for nosebleeds and sore throat.   Eyes:  Negative for double vision.  Respiratory:  Negative for cough, hemoptysis, sputum production and wheezing.   Cardiovascular:  Negative for chest pain, palpitations, orthopnea and leg swelling.  Gastrointestinal:  Negative for abdominal pain, blood in stool, diarrhea, heartburn, melena and vomiting.  Genitourinary:  Negative for dysuria, frequency and urgency.  Musculoskeletal:  Positive for back pain and joint pain.  Skin: Negative.  Negative for itching and rash.  Neurological:  Negative for tingling, focal weakness, weakness and headaches.  Endo/Heme/Allergies:  Does not bruise/bleed easily.  Psychiatric/Behavioral:  Negative for depression. The patient is not nervous/anxious and does not have insomnia.      MEDICAL HISTORY:  Past Medical History:  Diagnosis Date   Anxiety    Hypertension    Lung cancer ([facility]    Right Middle Lobe    SURGICAL HISTORY: Past Surgical History:  Procedure Laterality Date   APPLICATION OF CRANIAL NAVIGATION N/A 06/29/2019   Procedure: APPLICATION OF CRANIAL NAVIGATION;  Surgeon: OJudith Part MD;  Location[place] [facility]  Service: Neurosurgery;  Laterality: N/A;   CRANIOTOMY Left 06/29/2019   Procedure: LEFT CRANIOTOMY FOR TUMOR EXCISION;  Surgeon:  Judith Part, MD;  Location: [facility];  Service: Neurosurgery;  Laterality: Left;    SOCIAL HISTORY: Social History   Socioeconomic History   Marital status: Married    Spouse name: Not on file   Number of children: Not on file   Years of education: Not on file   Highest education  level: Not on file  Occupational History   Not on file  Tobacco Use   Smoking status: Never   Smokeless tobacco: Never  Vaping Use   Vaping Use: Never used  Substance and Sexual Activity   Alcohol use: Never   Drug use: Never   Sexual activity: Not Currently    Birth control/protection: Post-menopausal  Other Topics Concern   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  She lives with her children in [place].   Social Determinants of Health   Financial Resource Strain: Not on file  Food Insecurity: Not on file  Transportation Needs: Not on file  Physical Activity: Not on file  Stress: Not on file  Social Connections: Not on file  Intimate Partner Violence: Not on file    FAMILY HISTORY: Family History  Problem Relation Age of Onset   Cancer Son        some type of cancer involving the brain    ALLERGIES:  is allergic to lisinopril.  MEDICATIONS:  Current Outpatient Medications  Medication Sig Dispense Refill   acetaminophen (TYLENOL) 325 MG tablet Take 2 tablets (650 mg total) by mouth every 4 (four) hours as needed for mild pain (temp > 101.5). (Patient not taking: Reported on 09/25/2021)     docusate sodium (COLACE) 100 MG capsule Take 1 capsule (100 mg total) by mouth 2 (two) times daily. (Patient not taking: Reported on 09/25/2021) 10 capsule 0   FLUoxetine (PROZAC) 20 MG tablet Take 20 mg by mouth daily. (Patient not taking: Reported on 09/25/2021)     hydrochlorothiazide (HYDRODIURIL) 25 MG tablet Take 25 mg by mouth daily. (Patient not taking: Reported on 09/25/2021)     LORazepam (ATIVAN) 1 MG tablet Take 1 tablet (1 mg total) by mouth every 8 (eight) hours as needed for anxiety or sleep. (Patient not taking: Reported on 02/18/2021) 15 tablet 0   Multiple Vitamins-Minerals (CENTRUM SILVER 50+WOMEN) TABS Take 1 tablet by mouth daily. (Patient not taking: Reported on 09/25/2021) 30 tablet 4   omeprazole  (PRILOSEC) 20 MG capsule TAKE 1 CAPSULE BY MOUTH EVERY DAY (Patient not taking: Reported on 09/25/2021) 90 capsule 1   ondansetron (ZOFRAN) 4 MG tablet Take 1 tablet (4 mg total) by mouth every 6 (six) hours as needed for nausea or vomiting. (Patient not taking: Reported on 04/22/2021) 20 tablet 2   osimertinib mesylate (TAGRISSO) 40 MG tablet Take 1 tablet (40 mg total) by mouth daily. 30 tablet 3   oxyCODONE (OXY IR/ROXICODONE) 5 MG immediate release tablet Take 1 tablet (5 mg total) by mouth every 6 (six) hours as needed for severe pain. (Patient not taking: Reported on 04/22/2021) 45 tablet 0   propranolol (INDERAL) 10 MG tablet Take 10 mg by mouth 2 (two) times daily. (Patient not taking: Reported on 09/25/2021)     traZODone (DESYREL) 50 MG tablet Take 2 tablets (100 mg total) by mouth at bedtime. (Patient not taking: Reported on 09/25/2021) 30 tablet 0   No current facility-administered medications for this visit.      Marland Kitchen  PHYSICAL  EXAMINATION: ECOG PERFORMANCE STATUS: 2 - Symptomatic, <50% confined to bed  Vitals:   09/25/21 1110  BP: (!) 155/78  Pulse: 75  Temp: 98.3 F (36.8 C)  SpO2: 98%   Filed Weights   09/25/21 1110  Weight: 108 lb 11.2 oz (49.3 kg)    Physical Exam HENT:     Head: Normocephalic and atraumatic.     Mouth/Throat:     Pharynx: No oropharyngeal exudate.  Eyes:     Pupils: Pupils are equal, round, and reactive to light.  Cardiovascular:     Rate and Rhythm: Normal rate and regular rhythm.  Pulmonary:     Effort: Pulmonary effort is normal. No respiratory distress.     Breath sounds: Normal breath sounds. No wheezing.  Abdominal:     General: Bowel sounds are normal. There is no distension.     Palpations: Abdomen is soft. There is no mass.     Tenderness: There is no abdominal tenderness. There is no guarding or rebound.  Musculoskeletal:        General: No tenderness. Normal range of motion.     Cervical back: Normal range of motion and neck supple.   Skin:    General: Skin is warm.  Neurological:     Mental Status: She is alert and oriented to person, place, and time.  Psychiatric:        Mood and Affect: Affect normal.      LABORATORY DATA:  I have reviewed the data as listed Lab Results  Component Value Date   WBC 6.5 09/25/2021   HGB 11.8 (L) 09/25/2021   HCT 36.5 09/25/2021   MCV 96.1 09/25/2021   PLT 193 09/25/2021   Recent Labs    02/18/21 1410 04/22/21 1417 08/25/21 1115 09/25/21 1106  NA 137 137  --  139  K 3.2* 3.4*  --  4.0  CL 98 103  --  106  CO2 29 26  --  28  GLUCOSE 135* 113*  --  89  BUN 10 13  --  13  CREATININE 0.72 0.81 0.60 0.61  CALCIUM 9.7 9.4  --  9.5  GFRNONAA >60 >60  --  >60  PROT 7.4 7.4  --  7.6  ALBUMIN 4.4 4.4  --  4.5  AST 24 28  --  28  ALT 17 28  --  25  ALKPHOS 59 76  --  74  BILITOT 0.7 0.4  --  0.7    RADIOGRAPHIC STUDIES: I have personally reviewed the radiological images as listed and agreed with the findings in the report. No results found.  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 Currently Osimertinib 40 mg/day  CT scan [facility]- July 2023- No significant change in flattened, bandlike post treatment appearance of the lateral segment right middle lobe; no evidence of disease noted in the chest and pelvis.  # Continue osimertinib 40 mg daily- see below [poor tolerance to 80 mg/day].  Again stressed the importance of compliance.  Medication refilled today.    # Anemia- Hb 11.6- monitor for now; if worse would recommend EGD if worse [given CT dec 2022- gatsric thickening]  -stable.   # Cystic lesion in the tail of the pancreas measuring 7 mm  no main duct dilation or adjacent stranding. JULY 2023-CT scan-no obvious concerns.  Stable  # Brain metastases-status post resection  followed by SBRT.  MRI AUG 2022- No  progression.  Monitor for now; repeat MRI of the brain in 3 months/next visit  # DISPOSITION: # follow up in 3 months- - MD: labs- cbc/cmp.mag; MRI brain prior-Dr.B  # I reviewed the blood work- with the patient in detail; also reviewed the imaging independently [as summarized above]; and with the patient in detail.       All questions were answered. The patient knows to call the clinic with any problems, questions or concerns.   Cammie Sickle, MD 09/25/2021 2:14 PM

## 2021-09-25 NOTE — Telephone Encounter (Signed)
Oral Oncology Patient Advocate Encounter   Received notification that prior authorization for Tagrisso is required.   PA submitted on 09/25/2021 Key [id number]  Status is pending     Lady Deutscher, CPhT-Adv Pharmacy Patient Advocate Specialist [place] Patient Advocate Team Direct Number: [phone]  Fax: [phone]

## 2021-09-25 NOTE — Telephone Encounter (Signed)
Oral Oncology Patient Advocate Encounter  Prior Authorization for Sara Schroeder has been approved.    PA# [id number] Effective dates: 09/25/2021 through 09/25/2022  Patients co-pay is $4.30.    Sara Schroeder, CPhT-Adv Pharmacy Patient Advocate Specialist [place] Patient Advocate Team Direct Number: [phone]  Fax: [phone]

## 2021-09-25 NOTE — Assessment & Plan Note (Addendum)
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 Currently Osimertinib 40 mg/day  CT scan CAP- July 2023- No significant change in flattened, bandlike post treatment appearance of the lateral segment right middle lobe; no evidence of disease noted in the chest and pelvis.  # Continue osimertinib 40 mg daily- see below [poor tolerance to 80 mg/day].  Again stressed the importance of compliance.  Medication refilled today.    # Anemia- Hb 11.6- monitor for now; if worse would recommend EGD if worse [given CT dec 2022- gatsric thickening]  -stable.  # Cystic lesion in the tail of the pancreas measuring 7 mm  no main duct dilation or adjacent stranding. JULY 2023-CT scan-no obvious concerns.  Stable  # Brain metastases-status post resection followed by SBRT.  MRI AUG 2022- No progression.  Monitor for now; repeat MRI of the brain in 3 months/next visit  # DISPOSITION: # follow up in 3 months- - MD: labs- cbc/cmp.mag; MRI brain prior-Dr.B  # I reviewed the blood work- with the patient in detail; also reviewed the imaging independently [as summarized above]; and with the patient in detail.

## 2021-09-25 NOTE — Progress Notes (Signed)
C/o feet and fingers getting cold.  Results of scan and if she needs to take any meds?  Used interpretor on wheels, Rosie(K[place] language)

## 2021-09-26 ENCOUNTER — Other Ambulatory Visit (HOSPITAL_COMMUNITY): Payer: Self-pay

## 2021-10-17 ENCOUNTER — Other Ambulatory Visit (HOSPITAL_COMMUNITY): Payer: Self-pay

## 2021-10-24 ENCOUNTER — Emergency Department: Payer: Medicare Other

## 2021-10-24 ENCOUNTER — Other Ambulatory Visit: Payer: Self-pay

## 2021-10-24 ENCOUNTER — Inpatient Hospital Stay
Admission: EM | Admit: 2021-10-24 | Discharge: 2021-10-26 | DRG: 194 | Disposition: A | Discharge status: Home Health | Payer: Medicare Other | Attending: Internal Medicine | Admitting: Internal Medicine

## 2021-10-24 ENCOUNTER — Observation Stay: Payer: Medicare Other

## 2021-10-24 DIAGNOSIS — F419 Anxiety disorder, unspecified: Secondary | ICD-10-CM | POA: Diagnosis present

## 2021-10-24 DIAGNOSIS — C342 Malignant neoplasm of middle lobe, bronchus or lung: Secondary | ICD-10-CM | POA: Diagnosis present

## 2021-10-24 DIAGNOSIS — Z808 Family history of malignant neoplasm of other organs or systems: Secondary | ICD-10-CM

## 2021-10-24 DIAGNOSIS — R5381 Other malaise: Secondary | ICD-10-CM

## 2021-10-24 DIAGNOSIS — I959 Hypotension, unspecified: Secondary | ICD-10-CM | POA: Diagnosis present

## 2021-10-24 DIAGNOSIS — R197 Diarrhea, unspecified: Secondary | ICD-10-CM

## 2021-10-24 DIAGNOSIS — Z91148 Patient's other noncompliance with medication regimen for other reason: Secondary | ICD-10-CM

## 2021-10-24 DIAGNOSIS — K521 Toxic gastroenteritis and colitis: Secondary | ICD-10-CM | POA: Diagnosis present

## 2021-10-24 DIAGNOSIS — C799 Secondary malignant neoplasm of unspecified site: Secondary | ICD-10-CM

## 2021-10-24 DIAGNOSIS — R531 Weakness: Secondary | ICD-10-CM

## 2021-10-24 DIAGNOSIS — J189 Pneumonia, unspecified organism: Secondary | ICD-10-CM | POA: Diagnosis not present

## 2021-10-24 DIAGNOSIS — Z888 Allergy status to other drugs, medicaments and biological substances status: Secondary | ICD-10-CM

## 2021-10-24 DIAGNOSIS — M5136 Other intervertebral disc degeneration, lumbar region: Secondary | ICD-10-CM | POA: Diagnosis present

## 2021-10-24 DIAGNOSIS — I10 Essential (primary) hypertension: Secondary | ICD-10-CM | POA: Diagnosis present

## 2021-10-24 DIAGNOSIS — T451X5A Adverse effect of antineoplastic and immunosuppressive drugs, initial encounter: Secondary | ICD-10-CM | POA: Diagnosis present

## 2021-10-24 DIAGNOSIS — C7931 Secondary malignant neoplasm of brain: Secondary | ICD-10-CM | POA: Diagnosis present

## 2021-10-24 DIAGNOSIS — D649 Anemia, unspecified: Secondary | ICD-10-CM | POA: Diagnosis present

## 2021-10-24 DIAGNOSIS — E876 Hypokalemia: Secondary | ICD-10-CM | POA: Diagnosis present

## 2021-10-24 DIAGNOSIS — Z79899 Other long term (current) drug therapy: Secondary | ICD-10-CM

## 2021-10-24 DIAGNOSIS — K863 Pseudocyst of pancreas: Secondary | ICD-10-CM | POA: Diagnosis present

## 2021-10-24 DIAGNOSIS — I7090 Unspecified atherosclerosis: Secondary | ICD-10-CM | POA: Diagnosis present

## 2021-10-24 DIAGNOSIS — G4700 Insomnia, unspecified: Secondary | ICD-10-CM | POA: Diagnosis present

## 2021-10-24 LAB — PROTIME-INR
INR: 1 (ref 0.8–1.2)
Prothrombin Time: 12.8 seconds (ref 11.4–15.2)

## 2021-10-24 LAB — COMPREHENSIVE METABOLIC PANEL
ALT: 15 U/L (ref 0–44)
AST: 24 U/L (ref 15–41)
Albumin: 4.4 g/dL (ref 3.5–5.0)
Alkaline Phosphatase: 61 U/L (ref 38–126)
Anion gap: 8 (ref 5–15)
BUN: 9 mg/dL (ref 8–23)
CO2: 24 mmol/L (ref 22–32)
Calcium: 9.1 mg/dL (ref 8.9–10.3)
Chloride: 111 mmol/L (ref 98–111)
Creatinine, Ser: 0.61 mg/dL (ref 0.44–1.00)
GFR, Estimated: >60 mL/min (ref >60)
Glucose, Bld: 105 mg/dL — ABNORMAL HIGH (ref 70–99)
Potassium: 3.2 mmol/L — ABNORMAL LOW (ref 3.5–5.1)
Sodium: 143 mmol/L (ref 135–145)
Total Bilirubin: 1.3 mg/dL — ABNORMAL HIGH (ref 0.3–1.2)
Total Protein: 7.6 g/dL (ref 6.5–8.1)

## 2021-10-24 LAB — URINALYSIS, ROUTINE W REFLEX MICROSCOPIC
Bilirubin Urine: NEGATIVE
Glucose, UA: NEGATIVE mg/dL
Hgb urine dipstick: NEGATIVE
Ketones, ur: NEGATIVE mg/dL
Leukocytes,Ua: NEGATIVE
Nitrite: NEGATIVE
Protein, ur: NEGATIVE mg/dL
Specific Gravity, Urine: 1.002 — ABNORMAL LOW (ref 1.005–1.030)
pH: 8 (ref 5.0–8.0)

## 2021-10-24 LAB — CBC
HCT: 38.1 % (ref 36.0–46.0)
Hemoglobin: 12.3 g/dL (ref 12.0–15.0)
MCH: 30.5 pg (ref 26.0–34.0)
MCHC: 32.3 g/dL (ref 30.0–36.0)
MCV: 94.5 fL (ref 80.0–100.0)
Platelets: 194 10*3/uL (ref 150–400)
RBC: 4.03 MIL/uL (ref 3.87–5.11)
RDW: 13.2 % (ref 11.5–15.5)
WBC: 5.8 10*3/uL (ref 4.0–10.5)
nRBC: 0 % (ref 0.0–0.2)

## 2021-10-24 LAB — LACTIC ACID, PLASMA: Lactic Acid, Venous: 1.2 mmol/L (ref 0.5–1.9)

## 2021-10-24 LAB — LIPASE, BLOOD: Lipase: 32 U/L (ref 11–51)

## 2021-10-24 LAB — TROPONIN I (HIGH SENSITIVITY)
Troponin I (High Sensitivity): 4 ng/L (ref <18)
Troponin I (High Sensitivity): 7 ng/L (ref <18)

## 2021-10-24 LAB — CK: Total CK: 50 U/L (ref 38–234)

## 2021-10-24 LAB — APTT: aPTT: 26 seconds (ref 24–36)

## 2021-10-24 LAB — BRAIN NATRIURETIC PEPTIDE: B Natriuretic Peptide: 26.8 pg/mL (ref 0.0–100.0)

## 2021-10-24 MED ORDER — LOPERAMIDE HCL 2 MG PO CAPS
2.0000 mg | ORAL_CAPSULE | ORAL | Status: DC | PRN
Start: 2021-10-24 — End: 2021-10-26

## 2021-10-24 MED ORDER — OXYCODONE HCL 5 MG PO TABS: 5.0000 mg | ORAL_TABLET | Freq: Four times a day (QID) | ORAL | Status: DC | PRN

## 2021-10-24 MED ORDER — POTASSIUM CHLORIDE CRYS ER 20 MEQ PO TBCR
40.0000 meq | EXTENDED_RELEASE_TABLET | Freq: Once | ORAL | Status: AC
  Administered 2021-10-24: 40 meq via ORAL
  Filled 2021-10-24: qty 2

## 2021-10-24 MED ORDER — SENNOSIDES-DOCUSATE SODIUM 8.6-50 MG PO TABS: 1.0000 | ORAL_TABLET | Freq: Every evening | ORAL | Status: DC | PRN

## 2021-10-24 MED ORDER — ALBUTEROL SULFATE (2.5 MG/3ML) 0.083% IN NEBU: 2.5000 mg | INHALATION_SOLUTION | Freq: Four times a day (QID) | RESPIRATORY_TRACT | Status: DC

## 2021-10-24 MED ORDER — LABETALOL HCL 5 MG/ML IV SOLN
5.0000 mg | INTRAVENOUS | Status: DC | PRN
Start: 2021-10-24 — End: 2021-10-26

## 2021-10-24 MED ORDER — DOXYCYCLINE HYCLATE 100 MG PO TABS
100.0000 mg | ORAL_TABLET | Freq: Once | ORAL | Status: AC
  Administered 2021-10-24: 100 mg via ORAL
  Filled 2021-10-24: qty 1

## 2021-10-24 MED ORDER — TRAZODONE HCL 50 MG PO TABS
50.0000 mg | ORAL_TABLET | Freq: Every evening | ORAL | Status: DC | PRN
  Administered 2021-10-24 – 2021-10-26 (×2): 50 mg via ORAL
  Filled 2021-10-24 (×2): qty 1

## 2021-10-24 MED ORDER — HYDROXYZINE HCL 50 MG PO TABS: 50.0000 mg | ORAL_TABLET | Freq: Three times a day (TID) | ORAL | Status: DC | PRN

## 2021-10-24 MED ORDER — AZITHROMYCIN 500 MG PO TABS
500.0000 mg | ORAL_TABLET | Freq: Every day | ORAL | Status: DC
  Administered 2021-10-25 – 2021-10-26 (×2): 500 mg via ORAL
  Filled 2021-10-24 (×2): qty 1

## 2021-10-24 MED ORDER — ALPRAZOLAM 0.5 MG PO TABS: 0.5000 mg | ORAL_TABLET | Freq: Three times a day (TID) | ORAL | Status: DC | PRN

## 2021-10-24 MED ORDER — SODIUM CHLORIDE 0.9 % IV BOLUS
1000.0000 mL | Freq: Once | INTRAVENOUS | Status: AC
Start: 2021-10-24 — End: 2021-10-24
  Administered 2021-10-24: 1000 mL via INTRAVENOUS

## 2021-10-24 MED ORDER — ACETAMINOPHEN 650 MG RE SUPP: 650.0000 mg | Freq: Four times a day (QID) | RECTAL | Status: DC | PRN

## 2021-10-24 MED ORDER — SODIUM CHLORIDE 0.9 % IV SOLN
3.0000 g | Freq: Four times a day (QID) | INTRAVENOUS | Status: DC
  Administered 2021-10-25 – 2021-10-26 (×5): 3 g via INTRAVENOUS
  Filled 2021-10-24 (×5): qty 8
  Filled 2021-10-24 (×2): qty 3

## 2021-10-24 MED ORDER — IOHEXOL 300 MG/ML  SOLN
100.0000 mL | Freq: Once | INTRAMUSCULAR | Status: AC | PRN
  Administered 2021-10-24: 100 mL via INTRAVENOUS

## 2021-10-24 MED ORDER — MORPHINE SULFATE (PF) 2 MG/ML IV SOLN
1.0000 mg | INTRAVENOUS | Status: DC | PRN
  Administered 2021-10-25: 1 mg via INTRAVENOUS
  Filled 2021-10-24: qty 1

## 2021-10-24 MED ORDER — ONDANSETRON HCL 4 MG PO TABS: 4.0000 mg | ORAL_TABLET | Freq: Four times a day (QID) | ORAL | Status: DC | PRN

## 2021-10-24 MED ORDER — IPRATROPIUM-ALBUTEROL 0.5-2.5 (3) MG/3ML IN SOLN
3.0000 mL | Freq: Four times a day (QID) | RESPIRATORY_TRACT | Status: DC
  Administered 2021-10-24 – 2021-10-25 (×3): 3 mL via RESPIRATORY_TRACT
  Filled 2021-10-24 (×3): qty 3

## 2021-10-24 MED ORDER — IOHEXOL 350 MG/ML SOLN
60.0000 mL | Freq: Once | INTRAVENOUS | Status: AC | PRN
  Administered 2021-10-25: 60 mL via INTRAVENOUS

## 2021-10-24 MED ORDER — ALPRAZOLAM 0.25 MG PO TABS
0.2500 mg | ORAL_TABLET | Freq: Three times a day (TID) | ORAL | Status: DC | PRN
  Administered 2021-10-24 – 2021-10-26 (×4): 0.25 mg via ORAL
  Filled 2021-10-24 (×4): qty 1

## 2021-10-24 MED ORDER — PROPRANOLOL HCL 20 MG PO TABS: 10.0000 mg | ORAL_TABLET | Freq: Two times a day (BID) | ORAL | Status: DC

## 2021-10-24 MED ORDER — LACTATED RINGERS IV SOLN: INTRAVENOUS | Status: AC

## 2021-10-24 MED ORDER — PANTOPRAZOLE SODIUM 40 MG PO TBEC
40.0000 mg | DELAYED_RELEASE_TABLET | Freq: Every day | ORAL | Status: DC
  Administered 2021-10-24 – 2021-10-26 (×3): 40 mg via ORAL
  Filled 2021-10-24 (×3): qty 1

## 2021-10-24 MED ORDER — ONDANSETRON HCL 4 MG/2ML IJ SOLN: 4.0000 mg | Freq: Four times a day (QID) | INTRAMUSCULAR | Status: DC | PRN

## 2021-10-24 MED ORDER — ENOXAPARIN SODIUM 40 MG/0.4ML IJ SOSY
40.0000 mg | PREFILLED_SYRINGE | INTRAMUSCULAR | Status: DC
  Administered 2021-10-24 – 2021-10-25 (×2): 40 mg via SUBCUTANEOUS
  Filled 2021-10-24 (×2): qty 0.4

## 2021-10-24 MED ORDER — SODIUM CHLORIDE 0.9 % IV SOLN: 1.0000 g | INTRAVENOUS | Status: DC

## 2021-10-24 MED ORDER — AMLODIPINE BESYLATE 5 MG PO TABS
5.0000 mg | ORAL_TABLET | Freq: Every day | ORAL | Status: DC
  Administered 2021-10-24 – 2021-10-25 (×2): 5 mg via ORAL
  Filled 2021-10-24 (×3): qty 1

## 2021-10-24 MED ORDER — MORPHINE SULFATE (PF) 2 MG/ML IV SOLN: 2.0000 mg | INTRAVENOUS | Status: DC | PRN

## 2021-10-24 MED ORDER — ACETAMINOPHEN 325 MG PO TABS
650.0000 mg | ORAL_TABLET | Freq: Four times a day (QID) | ORAL | Status: DC | PRN
  Administered 2021-10-24 – 2021-10-25 (×2): 650 mg via ORAL
  Filled 2021-10-24 (×2): qty 2

## 2021-10-24 MED ORDER — CEFTRIAXONE SODIUM 1 G IJ SOLR
1.0000 g | Freq: Once | INTRAMUSCULAR | Status: AC
Start: 2021-10-24 — End: 2021-10-24
  Administered 2021-10-24: 1 g via INTRAVENOUS
  Filled 2021-10-24: qty 10

## 2021-10-24 NOTE — ED Triage Notes (Addendum)
Pt was on a cancer medication and two weeks ago stopped due to constipation. Pt began taking miralax which caused diarrhea. The diarrhea has lasted two weeks and lead to weakness and heart palpitations.

## 2021-10-24 NOTE — ED Notes (Signed)
This RN to bedside with meds; pt remains away at imaging.

## 2021-10-24 NOTE — ED Triage Notes (Signed)
Patient speaks [place]. Patient arrived by EMS from home with c/o abdominal pain and diarrhea X 10 days. EMS administered 250cc normal saline  EMS vitals  161/93 95HR 100% RA 147CBG 98.4oral temp

## 2021-10-24 NOTE — ED Provider Triage Note (Signed)
Emergency Medicine Provider Triage Evaluation Note  True limited by [place] language.  Patient's adult son is present for HPI and translation.  Sara Schroeder, a 71 y.o. female  was evaluated in triage.  Pt complains of persistent diarrhea x2 weeks and generalized weakness.  Patient is to the care of the [facility] at the cancer center, for a primary lung cancer.  She started a new Medication 2 Weeks Ago but Discontinued the Medication Secondary to Constipation.  She Also Began Some Stool Softeners at That Time.  The Constipation Resolved but Diarrhea Ensued.  Patient Presents with Complaints of Intermittent Weakness, neurolyse abdominal pain, and Heart Palpitations.  She arrives via EMS from home, having received 250 cc of normal saline.  Review of Systems  Positive: Diarrhea, abd pain, weakness Negative: FCS  Physical Exam  BP (!) 156/80 (BP Location: Left Arm)   Pulse 98   Temp 98 F (36.7 C) (Oral)   Resp 20   Ht 5\' 1"  (1.549 m)   Wt 48.5 kg   SpO2 96%   BMI 20.22 kg/m  Gen:   Awake, no distress  NAD Resp:  Normal effort  MSK:   Moves extremities without difficulty  ABD:  Soft, nontender  Medical Decision Making  Medically screening exam initiated at 11:32 AM.  Appropriate orders placed.  Sara Schroeder was informed that the remainder of the evaluation will be completed by another provider, this initial triage assessment does not replace that evaluation, and the importance of remaining in the ED until their evaluation is complete.  Geriatric patient with history of lung cancer, presents to the ED with 2 weeks of intermittent diarrhea, weakness, heart palpitations.   Sara Needles, PA-C 10/24/21 1202

## 2021-10-24 NOTE — H&P (Signed)
History and Physical    [name] ERD:[id number] DOB: 1950-05-28 DOA: 10/24/2021  PCP: [facility] Patient coming from: Home  Chief Complaint: Shortness of Breath  HPI: Sara Schroeder is a 71 y.o. female with medical history significant of Hypertension (non-adherent to medications occasionally), Anxiety, GERD, and Stage 4 Lung Cancer s/p 2021 left craniotomy ( currently on chemotherapy with Dr. Rogue Bussing) who presents to the ED with acute progressively worsening shortness of breath over the last three days associated with a cough productive of yellow sputum.  She also reports non-bloody watery diarrhea associated with abdominal cramps for two weeks.  Sara Schroeder states she stopped taking her chemotherapy medications recently due to the diarrhea.  Chest Xray imaging shows mild bibasilar infiltrates consistent with communicty acquired pneumonia.  Patient denies any fevers, chills, chest pain, nausea, vomiting, or urinary symptoms.  Vital Signs are stable.  Lung and Abdominal exam are benign.  Patient speaks limited [place].  Family member was in the room to help complete history.  ED Course:  NS Bolus 1L x once IV Ceftriaxone 1 g x once Doxycycline 100 mg x once  Review of Systems: As per HPI; otherwise review of systems reviewed and negative.   Ambulatory Status: Ambulates without assistance   Past Medical History:  Diagnosis Date   Anxiety    Hypertension    Lung cancer ([facility])    Right Middle Lobe    Past Surgical History:  Procedure Laterality Date   APPLICATION OF CRANIAL NAVIGATION N/A 06/29/2019   Procedure: APPLICATION OF CRANIAL NAVIGATION;  Surgeon: Judith Part, MD;  Location: [facility];  Service: Neurosurgery;  Laterality: N/A;   CRANIOTOMY Left 06/29/2019   Procedure: LEFT CRANIOTOMY FOR TUMOR EXCISION;  Surgeon: Judith Part, MD;  Location: [facility];  Service: Neurosurgery;  Laterality: Left;    Social History   Socioeconomic History   Marital status:  Married    Spouse name: Not on file   Number of children: Not on file   Years of education: Not on file   Highest education level: Not on file  Occupational History   Not on file  Tobacco Use   Smoking status: Never   Smokeless tobacco: Never  Vaping Use   Vaping Use: Never used  Substance and Sexual Activity   Alcohol use: Never   Drug use: Never   Sexual activity: Not Currently    Birth control/protection: Post-menopausal  Other Topics Concern   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  She lives with her children in [place].   Social Determinants of Health   Financial Resource Strain: Not on file  Food Insecurity: Not on file  Transportation Needs: Not on file  Physical Activity: Not on file  Stress: Not on file  Social Connections: Not on file  Intimate Partner Violence: Not on file    Allergies  Allergen Reactions   Lisinopril Other (See Comments)    [facility] list from [facility]     Family History  Problem Relation Age of Onset   Cancer Son        some type of cancer involving the brain    Prior to Admission medications   Medication Sig Start Date End Date Taking? Authorizing Provider  acetaminophen (TYLENOL) 325 MG tablet Take 2 tablets (650 mg total) by mouth every 4 (four) hours as needed for mild pain (temp > 101.5). Patient  not taking: Reported on 09/25/2021 07/11/19   Angiulli, Lavon Paganini, PA-C  docusate sodium (COLACE) 100 MG capsule Take 1 capsule (100 mg total) by mouth 2 (two) times daily. Patient not taking: Reported on 09/25/2021 07/11/19   Angiulli, Lavon Paganini, PA-C  FLUoxetine (PROZAC) 20 MG tablet Take 20 mg by mouth daily. Patient not taking: Reported on 09/25/2021 03/27/21   [provider]  hydrochlorothiazide (HYDRODIURIL) 25 MG tablet Take 25 mg by mouth daily. Patient not taking: Reported on 09/25/2021 02/27/21   [provider]  LORazepam  (ATIVAN) 1 MG tablet Take 1 tablet (1 mg total) by mouth every 8 (eight) hours as needed for anxiety or sleep. Patient not taking: Reported on 02/18/2021 10/31/20   Paulette Blanch, MD  Multiple Vitamins-Minerals (CENTRUM SILVER 50+WOMEN) TABS Take 1 tablet by mouth daily. Patient not taking: Reported on 09/25/2021 02/18/21   Cammie Sickle, MD  omeprazole (PRILOSEC) 20 MG capsule TAKE 1 CAPSULE BY MOUTH EVERY DAY Patient not taking: Reported on 09/25/2021 05/16/21   Verlon Au, NP  ondansetron (ZOFRAN) 4 MG tablet Take 1 tablet (4 mg total) by mouth every 6 (six) hours as needed for nausea or vomiting. Patient not taking: Reported on 04/22/2021 02/18/21   Cammie Sickle, MD  osimertinib mesylate (TAGRISSO) 40 MG tablet Take 1 tablet (40 mg total) by mouth daily. 09/25/21   Cammie Sickle, MD  oxyCODONE (OXY IR/ROXICODONE) 5 MG immediate release tablet Take 1 tablet (5 mg total) by mouth every 6 (six) hours as needed for severe pain. Patient not taking: Reported on 04/22/2021 02/05/21   Borders, Kirt Boys, NP  propranolol (INDERAL) 10 MG tablet Take 10 mg by mouth 2 (two) times daily. Patient not taking: Reported on 09/25/2021 03/26/21   [provider]  traZODone (DESYREL) 50 MG tablet Take 2 tablets (100 mg total) by mouth at bedtime. Patient not taking: Reported on 09/25/2021 07/12/19   Cathlyn Parsons, PA-C    Physical Exam: Vitals:   10/24/21 1116 10/24/21 1123 10/24/21 1455 10/24/21 1455  BP: (!) 156/80   (!) 170/83  Pulse: 98  (!) 109 (!) 109  Resp: 20   (!) 23  Temp: 98 F (36.7 C)     TempSrc: Oral     SpO2: 96%   97%  Weight: 61.2 kg 48.5 kg    Height: 5\' 1"  (1.549 m)        Examination: General exam: chronically ill appearing, thin, frail, fatigued, anxious, mild tachypnea. HEENT: NCAT, PERRL Respiratory system: mild expiratory wheezing. Cardiovascular system: Did not appreciate a murmur, regular, No JVD. Gastrointestinal system: No flank pain, Abdomen  soft, NT,ND, BS+. Nervous System: No focal deficits. Extremities: No edema, distal peripheral pulses palpable.  Skin: No rashes, No bruises, No icterus. MSK: Physical Deconditioning  Radiological Exams on Admission: Independently reviewed - see discussion in A/P where applicable  CT Abdomen Pelvis W Contrast  Result Date: 10/24/2021 CLINICAL DATA:  Left lower quadrant abdominal pain. History of metastatic lung cancer. EXAM: CT ABDOMEN AND PELVIS WITH CONTRAST TECHNIQUE: Multidetector CT imaging of the abdomen and pelvis was performed using the standard protocol following bolus administration of intravenous contrast. RADIATION DOSE REDUCTION: This exam was performed according to the departmental dose-optimization program which includes automated exposure control, adjustment of the mA and/or kV according to patient size and/or use of iterative reconstruction technique. body onc. CONTRAST:  11mL OMNIPAQUE IOHEXOL 300 MG/ML  SOLN COMPARISON:  CT 08/25/2021. FINDINGS: Lower chest: Scarring identified  within the inferior right middle lobe. No acute abnormality. Hepatobiliary: No focal liver abnormality is seen. No gallstones, gallbladder wall thickening, or biliary dilatation. Pancreas: Stable 7 mm low-density lesion arising off the pancreatic tail, image 15/2. No pancreatic ductal dilatation or surrounding inflammatory changes. Spleen: Normal in size without focal abnormality. Adrenals/Urinary Tract: Normal adrenal glands. No nephrolithiasis or hydronephrosis. No suspicious kidney mass. Urinary bladder is unremarkable. Stomach/Bowel: Stomach appears normal. The appendix is visualized and is within normal limits. There is no bowel wall thickening, inflammation, or distension. Vascular/Lymphatic: Aortic atherosclerosis. No aneurysm. No signs of abdominopelvic adenopathy. Reproductive: Uterus and bilateral adnexa are unremarkable. Other: No free fluid or fluid collections. No signs of pneumoperitoneum.  Musculoskeletal: Degenerative disc disease noted at L4-5. No acute or suspicious osseous findings. IMPRESSION: 1. No acute findings within the abdomen or pelvis. 2. Stable 7 mm low-density lesion arising off the pancreatic tail. Unchanged from previous exam most likely representing a small IPMN or pseudocyst. Suggest attention on follow-up imaging. 3. Aortic Atherosclerosis (ICD10-I70.0). Electronically Signed   By: Kerby Moors M.D.   On: 10/24/2021 14:49   DG Chest 2 View  Result Date: 10/24/2021 CLINICAL DATA:  Weakness and constipation. EXAM: CHEST - 2 VIEW COMPARISON:  07/05/2019 FINDINGS: Lungs are hyperexpanded. Bandlike atelectasis or scarring noted parahilar right mid lung. Patchy airspace disease is seen in the infrahilar right lung and left base. No pulmonary edema. No pleural effusion. The cardiopericardial silhouette is within normal limits for size. The visualized bony structures of the thorax are unremarkable. IMPRESSION: Patchy airspace disease in the infrahilar right lung and left base. Multifocal pneumonia concern. Electronically Signed   By: Misty Stanley M.D.   On: 10/24/2021 12:01    EKG: Independently reviewed.  No acute ST or T wave changes.  QTC is 455 ms.   Labs on Admission: I have personally reviewed the available labs and imaging studies at the time of the admission.  Pertinent labs:   Lab Results  Component Value Date   WBC 5.8 10/24/2021   HGB 12.3 10/24/2021   HCT 38.1 10/24/2021   MCV 94.5 10/24/2021   PLT 194 24/23/5361   Last metabolic panel Lab Results  Component Value Date   GLUCOSE 105 (H) 10/24/2021   NA 143 10/24/2021   K 3.2 (L) 10/24/2021   CL 111 10/24/2021   CO2 24 10/24/2021   BUN 9 10/24/2021   CREATININE 0.61 10/24/2021   GFRNONAA >60 10/24/2021   CALCIUM 9.1 10/24/2021   PHOS 3.3 07/02/2019   PROT 7.6 10/24/2021   ALBUMIN 4.4 10/24/2021   BILITOT 1.3 (H) 10/24/2021   ALKPHOS 61 10/24/2021   AST 24 10/24/2021   ALT 15 10/24/2021    ANIONGAP 8 10/24/2021    Assessment/Plan History of 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 - Give IV Morphine PRN for any severe pain complaints. - Give Xanax PRN for anxiety. - Follow up with Heme/Onc Dr. Rogue Bussing outpatient.  Acute Diarrhea likely due to chemo medication:  Patient is currently on Osimertinib 40 mg daily and follows with Heme/Onc Dr. Rogue Bussing.  Abdominal CT showed no acute abnormality. - Hold chemo meds.   - Give fluids and manage with supportive care.  Acute Dyspnea secondary to Community Acquired Pneumonia: Chest imaging shows mild pneumonia.  - Given history of lung cancer, check CT with PE Protocol. - S/P  IV Ceftriaxone 1 g and Doxycycline 100 mg x once.  Start IV Unasyn 3g q6h to add anaerobic coverage and Azithromycin 500 mg daily to cover Legionella.  Complete 7 day course. - Given concurrent diarrhea, check Legionella antibody. - Schedule Duo-Nebs q6hrs and give Albuterol PRN for shortness of breath.  Hypokalemia due to diarrhea, replaced: - Repeat level in am. - Check Mg and Ph.  Essential Hypertension: BP is elevated at 170/83 mmHg with HR 109 beats/min.  Patient is non-adherent with home medications.  There is a large component of anxiety. - Continue home medication Amlodipine 5 mg daily.  Hold off on HCTZ to avoid dehydration and hold off on Propanolol.  Patient is more likely to adhere to one pill daily.  Home medications have not been confirmed yet. - Give IV Labetalol 5 mg PRN for SBP > 180 mmHg.  Hold if HR < 60 beats/min.  Anxiety: - Xanax PRN.  Insomnia: - Trazodone 50 mg PRN.  7 mm Pancreatic Pseudocyst:  - Monitor outpatient.   Degenerative Disc Disease:  - Pain medications PRN.  Atherosclerotic Disease  Body mass index is 20.22 kg/m.   Level of care:  DVT prophylaxis: Lovenox Code  Status: Full - confirmed with patient.  We will readdress at another time. Family Communication: I spoke with patient and family member. Disposition Plan:  The patient is from: home  Anticipated d/c is to: home without [facility] services once her pneumonia symptoms resolve.  Anticipated d/c date will depend on clinical response to treatment, but possibly as early as tomorrow if she has excellent response to treatment  Patient is currently: acutely ill Consults called: None Admission status:  Observation   George Hugh MD [facility]   How to contact the [facility] Attending or Consulting provider [place] or covering provider during after hours [place], for this patient?  Check the care team in [facility] and look for a) attending/consulting [facility] provider listed and b) the [facility] team listed Log into [email or web address] and use [place]'s universal password to access. If you do not have the password, please contact the hospital operator. Locate the [facility] provider you are looking for under [facility] and page to a number that you can be directly reached. If you still have difficulty reaching the provider, please page the [facility] (Director on Call) for the Hospitalists listed on amion for assistance.   10/24/2021, 3:50 PM

## 2021-10-24 NOTE — ED Notes (Signed)
See triage note. Pt reports CP, diarrhea intermittently then constipation, anxiety and difficulty breathing. Pt's resp currently unlabored but irregular, skin dry and pt sitting calmly on stretcher. Visitor remains with pt.

## 2021-10-24 NOTE — ED Notes (Signed)
Pt back from imaging and now up to restroom with family using wheelchair. Will start/give meds once pt back to room.

## 2021-10-24 NOTE — ED Provider Notes (Signed)
[facility] Provider Note    Event Date/Time   First MD Initiated Contact with Patient 10/24/21 1206     (approximate)   History   Diarrhea   HPI  Sara Schroeder is a 71 y.o. female   Past medical history of lung cancer on osimertinib, pretension, anxiety who presents with diarrhea, abdominal pain for the past 1 week.  She has also had shortness of breath and generalized weakness. She has been adjusting her cancer medications and constipation medications and thinks that may be contributing to her diarrhea.  No blood in BM.  No nausea or vomiting.  No urinary symptoms.  Denies fever.  No cough.  No chest pain. Denies rashes.   History was obtained via the patient and a review of external medical notes.      Physical Exam   Triage Vital Signs: ED Triage Vitals  Enc Vitals Group     BP 10/24/21 1116 (!) 156/80     Pulse Rate 10/24/21 1116 98     Resp 10/24/21 1116 20     Temp 10/24/21 1116 98 F (36.7 C)     Temp Source 10/24/21 1116 Oral     SpO2 10/24/21 1116 96 %     Weight 10/24/21 1116 135 lb (61.2 kg)     Height 10/24/21 1116 5\' 1"  (1.549 m)     Head Circumference --      Peak Flow --      Pain Score 10/24/21 1122 4     Pain Loc --      Pain Edu? --      Excl. in [place]? --     Most recent vital signs: Vitals:   10/24/21 1455 10/24/21 1455  BP:  (!) 170/83  Pulse: (!) 109 (!) 109  Resp:  (!) 23  Temp:    SpO2:  97%    General: Awake, no distress.  Tired appearing but alert. CV:  Dry mucous membranes and poor skin turgor, appears clinically dehydrated. Resp:  Normal effort.  Lungs clear to auscultation without wheezing or focalities. Abd:  No distention.  Mild lower abdominal tenderness to palpation bilaterally. Other:  No skin rashes noted.   ED Results / Procedures / Treatments   Labs (all labs ordered are listed, but only abnormal results are displayed) Labs Reviewed  COMPREHENSIVE METABOLIC PANEL - Abnormal; Notable for the  following components:      Result Value   Potassium 3.2 (*)    Glucose, Bld 105 (*)    Total Bilirubin 1.3 (*)    All other components within normal limits  URINALYSIS, ROUTINE W REFLEX MICROSCOPIC - Abnormal; Notable for the following components:   Color, Urine COLORLESS (*)    APPearance CLEAR (*)    Specific Gravity, Urine 1.002 (*)    All other components within normal limits  LEGIONELLA PNEUMOPHILA/CULTURE  STOOL CULTURE  LIPASE, BLOOD  CBC  PROTIME-INR  APTT  CK  BRAIN NATRIURETIC PEPTIDE  LACTIC ACID, PLASMA  HIV ANTIBODY (ROUTINE TESTING W REFLEX)  TROPONIN I (HIGH SENSITIVITY)  TROPONIN I (HIGH SENSITIVITY)     I reviewed labs and they are notable for mild hypokalemia 3.2.  Nitrite and leukocyte negative urine.  EKG  ED ECG REPORT I, Lucillie Garfinkel, the attending physician, personally viewed and interpreted this ECG.   Date: 10/24/2021  EKG Time: 1123   Rate: 94  Rhythm: normal EKG, normal sinus rhythm  Axis: normal  Intervals:none  ST&T Change: no ischemic  changes    RADIOLOGY I dependently reviewed and interpreted CXR and see patchy bilateral opacities concerning for pneumonia.    PROCEDURES:  Critical Care performed: No  Procedures   MEDICATIONS ORDERED IN ED: Medications  azithromycin (ZITHROMAX) tablet 500 mg (has no administration in time range)  lactated ringers infusion (has no administration in time range)  enoxaparin (LOVENOX) injection 40 mg (has no administration in time range)  acetaminophen (TYLENOL) tablet 650 mg (has no administration in time range)    Or  acetaminophen (TYLENOL) suppository 650 mg (has no administration in time range)  traZODone (DESYREL) tablet 50 mg (has no administration in time range)  senna-docusate (Senokot-S) tablet 1 tablet (has no administration in time range)  ondansetron (ZOFRAN) tablet 4 mg (has no administration in time range)    Or  ondansetron (ZOFRAN) injection 4 mg (has no administration in time  range)  albuterol (PROVENTIL) (2.5 MG/3ML) 0.083% nebulizer solution 2.5 mg (has no administration in time range)  labetalol (NORMODYNE) injection 5 mg (has no administration in time range)  amLODipine (NORVASC) tablet 5 mg (has no administration in time range)  pantoprazole (PROTONIX) EC tablet 40 mg (has no administration in time range)  oxyCODONE (Oxy IR/ROXICODONE) immediate release tablet 5 mg (has no administration in time range)  morphine (PF) 2 MG/ML injection 2 mg (has no administration in time range)  ALPRAZolam (XANAX) tablet 0.5 mg (has no administration in time range)  Ampicillin-Sulbactam (UNASYN) 3 g in sodium chloride 0.9 % 100 mL IVPB (has no administration in time range)  loperamide (IMODIUM) capsule 2 mg (has no administration in time range)  ipratropium-albuterol (DUONEB) 0.5-2.5 (3) MG/3ML nebulizer solution 3 mL (has no administration in time range)  sodium chloride 0.9 % bolus 1,000 mL (1,000 mLs Intravenous New Bag/Given 10/24/21 1445)  potassium chloride SA (KLOR-CON M) CR tablet 40 mEq (40 mEq Oral Given 10/24/21 1443)  cefTRIAXone (ROCEPHIN) 1 g in sodium chloride 0.9 % 100 mL IVPB (1 g Intravenous New Bag/Given 10/24/21 1446)  doxycycline (VIBRA-TABS) tablet 100 mg (100 mg Oral Given 10/24/21 1443)  iohexol (OMNIPAQUE) 300 MG/ML solution 100 mL (100 mLs Intravenous Contrast Given 10/24/21 1426)    Consultants:  I spoke with hospitalist about admission and  regarding care plan for this patient.   IMPRESSION / MDM / ASSESSMENT AND PLAN / ED COURSE  I reviewed the triage vital signs and the nursing notes.                              Differential diagnosis includes, but is not limited to, abdominal infection, colitis, appendicitis, or Neri tract infection, pneumonia.  Metabolic derangements, AKI.  Progression of cancer.  MDM: Differential diagnosis of infectious causes and metabolic derangements for this patient with abdominal pain and diarrhea for 1 week.  Check basic labs  and a CT scan of the abdomen pelvis along with a urinalysis and chest x-ray.  Social determinants of health considered in the disposition of this patient who is noted to have medication noncompliance and lives alone with frequent visits by her family members, language barrier, progressively weaker from  her diarrheal illness  Work-up thus far reveals lateral patchy opacities concerning for pneumonia, in the setting of shortness of breath, will treat for community-acquired pneumonia.  K repletion. Fluids. Pending  CT of the abdomen and pelvis, will need admission.   CT abd/pelvis neg.   Reveiwed findings with patient and again ROS for respiratory  sx given pna on CXR; states progressive dyspnea though cc diarrhea/abd pain. Hx CA and now evidence of pna can be cause of her sx though consider PE as well given cancer hx; will order CTPE radiology to protocol given contrast enhanced CT done recently.    Patient's presentation is most consistent with acute presentation with potential threat to life or bodily function.       FINAL CLINICAL IMPRESSION(S) / ED DIAGNOSES   Final diagnoses:  Generalized weakness  Diarrhea, unspecified type  Pneumonia due to infectious organism, unspecified laterality, unspecified part of lung     Rx / DC Orders   ED Discharge Orders     None        Note:  This document was prepared using Dragon voice recognition software and may include unintentional dictation errors.    Lucillie Garfinkel, MD 10/24/21 [phone]

## 2021-10-25 ENCOUNTER — Observation Stay: Payer: Medicare Other

## 2021-10-25 DIAGNOSIS — E876 Hypokalemia: Secondary | ICD-10-CM | POA: Diagnosis not present

## 2021-10-25 DIAGNOSIS — D649 Anemia, unspecified: Secondary | ICD-10-CM | POA: Diagnosis present

## 2021-10-25 DIAGNOSIS — J189 Pneumonia, unspecified organism: Secondary | ICD-10-CM | POA: Diagnosis not present

## 2021-10-25 DIAGNOSIS — M5136 Other intervertebral disc degeneration, lumbar region: Secondary | ICD-10-CM | POA: Diagnosis present

## 2021-10-25 DIAGNOSIS — Z79899 Other long term (current) drug therapy: Secondary | ICD-10-CM | POA: Diagnosis not present

## 2021-10-25 DIAGNOSIS — T451X5A Adverse effect of antineoplastic and immunosuppressive drugs, initial encounter: Secondary | ICD-10-CM | POA: Diagnosis present

## 2021-10-25 DIAGNOSIS — K521 Toxic gastroenteritis and colitis: Secondary | ICD-10-CM | POA: Diagnosis present

## 2021-10-25 DIAGNOSIS — C342 Malignant neoplasm of middle lobe, bronchus or lung: Secondary | ICD-10-CM | POA: Diagnosis present

## 2021-10-25 DIAGNOSIS — I7090 Unspecified atherosclerosis: Secondary | ICD-10-CM | POA: Diagnosis present

## 2021-10-25 DIAGNOSIS — R197 Diarrhea, unspecified: Secondary | ICD-10-CM

## 2021-10-25 DIAGNOSIS — I10 Essential (primary) hypertension: Secondary | ICD-10-CM | POA: Diagnosis present

## 2021-10-25 DIAGNOSIS — G4700 Insomnia, unspecified: Secondary | ICD-10-CM | POA: Diagnosis present

## 2021-10-25 DIAGNOSIS — Z808 Family history of malignant neoplasm of other organs or systems: Secondary | ICD-10-CM | POA: Diagnosis not present

## 2021-10-25 DIAGNOSIS — R5381 Other malaise: Secondary | ICD-10-CM | POA: Diagnosis not present

## 2021-10-25 DIAGNOSIS — Z91148 Patient's other noncompliance with medication regimen for other reason: Secondary | ICD-10-CM | POA: Diagnosis not present

## 2021-10-25 DIAGNOSIS — I959 Hypotension, unspecified: Secondary | ICD-10-CM | POA: Diagnosis present

## 2021-10-25 DIAGNOSIS — F419 Anxiety disorder, unspecified: Secondary | ICD-10-CM | POA: Diagnosis present

## 2021-10-25 DIAGNOSIS — Z888 Allergy status to other drugs, medicaments and biological substances status: Secondary | ICD-10-CM | POA: Diagnosis not present

## 2021-10-25 DIAGNOSIS — K863 Pseudocyst of pancreas: Secondary | ICD-10-CM | POA: Diagnosis present

## 2021-10-25 DIAGNOSIS — C7931 Secondary malignant neoplasm of brain: Secondary | ICD-10-CM | POA: Diagnosis present

## 2021-10-25 LAB — BASIC METABOLIC PANEL
Anion gap: 3 — ABNORMAL LOW (ref 5–15)
BUN: 10 mg/dL (ref 8–23)
CO2: 26 mmol/L (ref 22–32)
Calcium: 8.8 mg/dL — ABNORMAL LOW (ref 8.9–10.3)
Chloride: 113 mmol/L — ABNORMAL HIGH (ref 98–111)
Creatinine, Ser: 0.67 mg/dL (ref 0.44–1.00)
GFR, Estimated: >60 mL/min (ref >60)
Glucose, Bld: 97 mg/dL (ref 70–99)
Potassium: 3.3 mmol/L — ABNORMAL LOW (ref 3.5–5.1)
Sodium: 142 mmol/L (ref 135–145)

## 2021-10-25 LAB — CBC
HCT: 32.1 % — ABNORMAL LOW (ref 36.0–46.0)
Hemoglobin: 10.6 g/dL — ABNORMAL LOW (ref 12.0–15.0)
MCH: 31.2 pg (ref 26.0–34.0)
MCHC: 33 g/dL (ref 30.0–36.0)
MCV: 94.4 fL (ref 80.0–100.0)
Platelets: 169 10*3/uL (ref 150–400)
RBC: 3.4 MIL/uL — ABNORMAL LOW (ref 3.87–5.11)
RDW: 13.2 % (ref 11.5–15.5)
WBC: 8 10*3/uL (ref 4.0–10.5)
nRBC: 0 % (ref 0.0–0.2)

## 2021-10-25 LAB — HIV ANTIBODY (ROUTINE TESTING W REFLEX): HIV Screen 4th Generation wRfx: NONREACTIVE

## 2021-10-25 MED ORDER — IPRATROPIUM-ALBUTEROL 0.5-2.5 (3) MG/3ML IN SOLN
3.0000 mL | Freq: Four times a day (QID) | RESPIRATORY_TRACT | Status: DC | PRN
Start: 2021-10-25 — End: 2021-10-26

## 2021-10-25 MED ORDER — POTASSIUM CHLORIDE CRYS ER 20 MEQ PO TBCR
40.0000 meq | EXTENDED_RELEASE_TABLET | Freq: Once | ORAL | Status: AC
Start: 2021-10-25 — End: 2021-10-25
  Administered 2021-10-25: 40 meq via ORAL
  Filled 2021-10-25: qty 2

## 2021-10-25 NOTE — Progress Notes (Signed)
SLP Cancellation Note  Patient Details Name: Sara Schroeder MRN: [id number] DOB: 08/24/1950   Cancelled treatment:       Reason Eval/Treat Not Completed: SLP screened, no needs identified, will sign off  Milessa Hogan B. Rutherford Nail, M.S., CCC-SLP, Mining engineer Certified Brain Injury [place]  [place] Office [phone] Ascom [phone] Fax [phone]

## 2021-10-25 NOTE — Evaluation (Signed)
Physical Therapy Evaluation Patient Details Name: Sara Schroeder MRN: [id number] DOB: 20-Oct-1950 Today's Date: 10/25/2021  History of Present Illness  Pt is a 71 y/o F admitted on 10/24/21 who presents with c/o acute progressively worsening SOB over the last 3 days associated with cough productive of yellow sputum, as well as non blody watery diarrhea associated with abdominal cramps x 2 weeks. Chest Xray imaging shows mild bibasilar infiltrates consistent with communicty acquired pneumonia. PMH: HTN, anxiety, GERD, stage 4 lung CA s/p 2021 craniotomy  Clinical Impression  Pt seen for PT evaluation. Pt's son notifying staff of pt's request to go to bathroom. Pt received sitting on EOB & requires min assist for STS, attempts at gait without AD but pt with decreased BLE strength & balance, unable to hold trunk upright & extend BLE hips/knees. Provided pt with RW & pt ambulates bed<>bathroom with RW & min assist, with poor awareness of safe use of AD. Pt with continent void on toilet & assisted back to bed. Attempted to use ipad interpreter but none available at this time. Pt's son [name]) reports pt lives with daughter & pt's husband & pt's spouse cares for her during the day while her daughter works. They also note, pt lives in a 2 level home where her bedroom is upstairs & pt has used a RW in the past. Educated them on recommendation of HHPT f/u but will continue to follow pt acutely to address balance, safety with AD during gait, and gait & stairs with LRAD.        Recommendations for follow up therapy are one component of a multi-disciplinary discharge planning process, led by the attending physician.  Recommendations may be updated based on patient status, additional functional criteria and insurance authorization.  Follow Up Recommendations Home health PT      Assistance Recommended at Discharge Intermittent Supervision/Assistance  Patient can return home with the following  A little help with walking  and/or transfers;A little help with bathing/dressing/bathroom;Assistance with cooking/housework;Assist for transportation;Help with stairs or ramp for entrance    Equipment Recommendations None recommended by PT (pt has RW)  Recommendations for Other Services  OT consult    Functional Status Assessment Patient has had a recent decline in their functional status and demonstrates the ability to make significant improvements in function in a reasonable and predictable amount of time.     Precautions / Restrictions Precautions Precautions: Fall Restrictions Weight Bearing Restrictions: No      Mobility  Bed Mobility Overal bed mobility: Needs Assistance Bed Mobility: Supine to Sit, Sit to Supine     Supine to sit: Supervision, HOB elevated Sit to supine: Supervision, HOB elevated        Transfers Overall transfer level: Needs assistance Equipment used: Rolling walker (2 wheels) Transfers: Sit to/from Stand Sit to Stand: Supervision, Min assist           General transfer comment: min assist for STS from EOB, supervision STS from toilet in bathroom    Ambulation/Gait Ambulation/Gait assistance: Min assist Gait Distance (Feet): 15 Feet (+ 15 ft) Assistive device: Rolling walker (2 wheels)   Gait velocity: decreased     General Gait Details: impulsive, pushing RW out in front of her, requires extra time to navigate around obstacles, poor awareness of safe hand placment during STS  Stairs            Wheelchair Mobility    Modified Rankin (Stroke Patients Only)       Balance Overall balance assessment:  Needs assistance Sitting-balance support: Feet supported, Bilateral upper extremity supported Sitting balance-Leahy Scale: Fair Sitting balance - Comments: supervision sitting on toilet   Standing balance support: During functional activity, No upper extremity supported Standing balance-Leahy Scale: Poor                                Pertinent Vitals/Pain Pain Assessment Pain Assessment: Faces Faces Pain Scale: No hurt    Home Living Family/patient expects to be discharged to:: Private residence Living Arrangements: Children;Spouse/significant other Available Help at Discharge: Family;Available 24 hours/day Type of Home: House Home Access: Stairs to enter   Entrance Stairs-Number of Steps: 1 Alternate Level Stairs-Number of Steps: flight Home Layout: Two level (son reports bedroom is on 2nd level) Home Equipment: Conservation officer, nature (2 wheels)      Prior Function               Mobility Comments: per pt/son, pt has used a RW in the past, daughter works but husband home during the day to care for pt       Hand Dominance        Extremity/Trunk Assessment   Upper Extremity Assessment Upper Extremity Assessment: Generalized weakness    Lower Extremity Assessment Lower Extremity Assessment: Generalized weakness       Communication      Cognition Arousal/Alertness: Awake/alert Behavior During Therapy: Impulsive                                   General Comments: difficult to assess 2/2 lack of interpreter        General Comments General comments (skin integrity, edema, etc.): Pt with continent void on toilet, performs peri hygiene without assistance.    Exercises     Assessment/Plan    PT Assessment Patient needs continued PT services  PT Problem List Decreased strength;Decreased coordination;Decreased activity tolerance;Decreased balance;Decreased mobility;Decreased safety awareness;Decreased knowledge of use of DME       PT Treatment Interventions DME instruction;Therapeutic exercise;Gait training;Balance training;Stair training;Neuromuscular re-education;Functional mobility training;Patient/family education;Therapeutic activities    PT Goals (Current goals can be found in the Care Plan section)  Acute Rehab PT Goals Patient Stated Goal: none stated PT Goal  Formulation: With patient/family Time For Goal Achievement: 11/08/21 Potential to Achieve Goals: Fair    Frequency Min 2X/week     Co-evaluation               AM-PAC PT "6 Clicks" Mobility  Outcome Measure Help needed turning from your back to your side while in a flat bed without using bedrails?: None Help needed moving from lying on your back to sitting on the side of a flat bed without using bedrails?: A Little Help needed moving to and from a bed to a chair (including a wheelchair)?: A Little Help needed standing up from a chair using your arms (e.g., wheelchair or bedside chair)?: A Little Help needed to walk in hospital room?: A Little Help needed climbing 3-5 steps with a railing? : A Lot 6 Click Score: 18    End of Session   Activity Tolerance: Patient tolerated treatment well Patient left: in bed;with call bell/phone within reach;with bed alarm set;with family/visitor present Nurse Communication: Mobility status PT Visit Diagnosis: Unsteadiness on feet (R26.81);Muscle weakness (generalized) (M62.81);Difficulty in walking, not elsewhere classified (R26.2)    Time: 2951-8841 PT Time Calculation (min) (ACUTE  ONLY): 15 min   Charges:   PT Evaluation $PT Eval Moderate Complexity: Wh[place], PT, DPT 10/25/21, 1:25 PM   Waunita Schooner 10/25/2021, 1:23 PM

## 2021-10-25 NOTE — Hospital Course (Signed)
Arina Hackel is a 71 y.o. female with medical history significant of Hypertension (non-adherent to medications occasionally), Anxiety, GERD, and Stage 4 Lung Cancer s/p 2021 left craniotomy ( currently on chemotherapy with Dr. Rogue Bussing) who presents to the ED with acute progressively worsening shortness of breath over the last three days associated with a cough productive of yellow sputum.  She also reports non-bloody watery diarrhea associated with abdominal cramps for two weeks.  Ms. Elleigh Cassetta states she stopped taking her chemotherapy medications recently due to the diarrhea.  Chest Xray imaging shows mild bibasilar infiltrates consistent with communicty acquired pneumonia.   9/2: Diarrhea resolved. Oxygen weaned. PT/OT to see, otherwise plan for DC 9/3.

## 2021-10-25 NOTE — Progress Notes (Signed)
Progress Note   Patient: Sara Schroeder RWE:[id number] DOB: 1950/05/09 DOA: 10/24/2021     0 DOS: the patient was seen and examined on 10/25/2021   Brief hospital course: Sara Schroeder is a 71 y.o. female with medical history significant of Hypertension (non-adherent to medications occasionally), Anxiety, GERD, and Stage 4 Lung Cancer s/p 2021 left craniotomy ( currently on chemotherapy with Dr. Rogue Bussing) who presents to the ED with acute progressively worsening shortness of breath over the last three days associated with a cough productive of yellow sputum.  She also reports non-bloody watery diarrhea associated with abdominal cramps for two weeks.  [name] states she stopped taking her chemotherapy medications recently due to the diarrhea.  Chest Xray imaging shows mild bibasilar infiltrates consistent with communicty acquired pneumonia.   9/2: Diarrhea resolved. Oxygen weaned. PT/OT to see, otherwise plan for DC 9/3.  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 Patient has significant anxiety related to cancer. Recommend starting [place] medication for anxiety, d/w patient xanax not ideal for long-term management.  - Give IV Morphine PRN for any severe pain complaints. - Give Xanax PRN for anxiety - Follow up with Heme/Onc Dr. Rogue Bussing outpatient   Acute Diarrhea likely due to chemo medication Hypokalemia due to diarrhea Deconditioning  Reports 2 weeks of lower abdominal pain and diarrhea. Patient is currently on Osimertinib 40 mg daily and follows with Heme/Onc Dr. Rogue Bussing, which we suspect is the etiology.  Abdominal CT showed no acute abnormality. Diarrhea resolved. Given 1 L NS and 750 LR, tolerating PO now. Reports poor PO intake and deconditioning as well  - Hold chemo meds.   - additional fluids PRN - replete  electrolytes, Mg, Phos ordered for AM  - stool PCR and c. Diff - PT/OT   Acute Dyspnea secondary to Community Acquired Pneumonia:  Given history of lung cancer, check CT with PE Protocol, negative for PE. Chest imaging shows mild pneumonia.  - S/P IV Ceftriaxone 1 g and Doxycycline 100 mg x once.   - Started IV Unasyn 3g q6h to add anaerobic coverage and Azithromycin 500 mg daily to cover Legionella.  Complete 7 day course. - Given concurrent diarrhea, check Legionella antibody. - Schedule Duo-Nebs q6hrs and give Albuterol PRN for shortness of breath.  Normocytic Anemia, dilutional S/p 1.75 L decreased from 12 to 10. Reports no blood losses. -cbc in AM   Essential Hypertension:  BP is elevated at 170/83 mmHg with HR 109 beats/min.  Patient is non-adherent with home medications.  There is a large component of anxiety. Patient is more likely to adhere to one pill daily.   - Continue home medication Amlodipine 5 mg daily.  Hold off on HCTZ to avoid dehydration and hold off on Propanolol.  - Give IV Labetalol 5 mg PRN for SBP > 180 mmHg.  Hold if HR < 60 beats/min.   Insomnia: - Trazodone 50 mg PRN.   7 mm Pancreatic Pseudocyst:  - Monitor outpatient.    Degenerative Disc Disease:  - Pain medications PRN.  Atherosclerotic Disease   Body mass index is 20.22 kg/m.     Level of care: floor DVT prophylaxis: Lovenox Code Status: Full - confirmed with patient.  We will readdress at another time. Family Communication: I spoke with patient and family member. Disposition Plan:  The patient is  from: home             Anticipated d/c is to: home without [facility] services once her pneumonia symptoms resolve.             Anticipated d/c date will depend on clinical response to treatment, but possibly as early as tomorrow if she has excellent response to treatment             Patient is currently: acutely ill Consults called: None Admission status:  Observation         Subjective: Pt feels  better, no more diarrhea. Ate some food this morning without any issues. Breathing is also improved.  Physical Exam: Vitals:   10/25/21 0143 10/25/21 0439 10/25/21 0730 10/25/21 0756  BP: (!) 94/57 120/62 117/65   Pulse: 83 80 89   Resp: 16 16 15    Temp: 98 F (36.7 C) 98 F (36.7 C) 99 F (37.2 C)   TempSrc: Oral Oral    SpO2: 98% 98% 99% 98%  Weight:      Height:       Physical Exam Vitals and nursing note reviewed.  Constitutional:      Appearance: Normal appearance. She is not ill-appearing.  HENT:     Head: Normocephalic and atraumatic.     Mouth/Throat:     Mouth: Mucous membranes are moist.  Cardiovascular:     Rate and Rhythm: Normal rate and regular rhythm.  Abdominal:     General: Abdomen is flat. There is no distension.     Palpations: Abdomen is soft. There is no mass.     Tenderness: There is no abdominal tenderness.  Musculoskeletal:     Right lower leg: No edema.     Left lower leg: No edema.  Skin:    General: Skin is warm and dry.     Capillary Refill: Capillary refill takes less than 2 seconds.  Neurological:     Mental Status: She is alert.  Psychiatric:        Mood and Affect: Mood normal.        Behavior: Behavior normal.      Data Reviewed:     Latest Ref Rng & Units 10/25/2021    4:30 AM 10/24/2021   11:32 AM 09/25/2021   11:06 AM  CBC  WBC 4.0 - 10.5 K/uL 8.0  5.8  6.5   Hemoglobin 12.0 - 15.0 g/dL 10.6  12.3  11.8   Hematocrit 36.0 - 46.0 % 32.1  38.1  36.5   Platelets 150 - 400 K/uL 169  194  193        Latest Ref Rng & Units 10/25/2021    4:30 AM 10/24/2021   11:32 AM 09/25/2021   11:06 AM  BMP  Glucose 70 - 99 mg/dL 97  105  89   BUN 8 - 23 mg/dL 10  9  13    Creatinine 0.44 - 1.00 mg/dL 0.67  0.61  0.61   Sodium 135 - 145 mmol/L 142  143  139   Potassium 3.5 - 5.1 mmol/L 3.3  3.2  4.0   Chloride 98 - 111 mmol/L 113  111  106   CO2 22 - 32 mmol/L 26  24  28    Calcium 8.9 - 10.3 mg/dL 8.8  9.1  9.5    Lipase 32 Trop 4, 7 U/A  bland  CT abd/pelvis: IMPRESSION: 1. No acute findings within the abdomen or pelvis. 2. Stable 7 mm low-density lesion arising off  the pancreatic tail. Unchanged from previous exam most likely representing a small IPMN or pseudocyst. Suggest attention on follow-up imaging. 3. Aortic Atherosclerosis (ICD10-I70.0).  CT PE: IMPRESSION: 1. Negative for acute pulmonary embolus. 2. Stable since July post treatment appearance of the Chest. 3. Calcified coronary artery atherosclerosis Aortic Atherosclerosis (ICD10-I70.0).  Family Communication: talked with son at bedside  Disposition: Status is: Observation The patient remains OBS appropriate and will d/c before 2 midnights.  Planned Discharge Destination: Home    Time spent: 35 minutes  Author: Lorelei Pont, MD 10/25/2021 12:06 PM  For on call review [email or web address].

## 2021-10-25 NOTE — Progress Notes (Signed)
Chaplain responded to consult for AD paperwork. Ch provided AD paperwork and educated family member on how to contact Pastoral care for notarization. Please contact Chaplain if needed.

## 2021-10-25 NOTE — TOC Progression Note (Signed)
Transition of Care [facility]) - Progression Note    Patient Details  Name: Sara Schroeder MRN: [id number] Date of Birth: 1950-06-23  [facility] of [facility]) CM/SW Contact  Izola Price, RN Phone Number: 10/25/2021, 2:37 PM  Clinical Narrative:   9/2: Left VM with son regarding HH recommendations by PT. Pricilla Holm (Son)  [phone] (Mobile) Simmie Davies RN CM         Expected Discharge Plan and Services                                                 Social Determinants of Health (SDOH) Interventions    Readmission Risk Interventions     No data to display

## 2021-10-25 NOTE — Evaluation (Addendum)
Occupational Therapy Evaluation Patient Details Name: Sara Schroeder MRN: [id number] DOB: 09/16/1950 Today's Date: 10/25/2021   History of Present Illness Pt is a 71 y/o F admitted on 10/24/21 who presents with c/o acute progressively worsening SOB over the last 3 days associated with cough productive of yellow sputum, as well as non bloody watery diarrhea associated with abdominal cramps x 2 weeks. Chest Xray imaging shows mild bibasilar infiltrates consistent with communicty acquired pneumonia. PMH: HTN, anxiety, GERD, stage 4 lung CA s/p 2021 craniotomy   Clinical Impression   Pt.'s son is present during the session. Sara Schroeder translation services were initiated through [employer], however Pt. was unable to consistently hear the interpretor, and the son again translated the interpretor to the Pt. The Pt. and son requested, and were agreeable to having the Pt.'s son provide translation instead of the audio services during the rest of the session. Pt. presents with weakness, limited activity tolerance, and limited functional mobility which hinders her ability to complete basic ADL and IADL functioning. Pt. resides at home with her husband, and daughter. Pt.'s husband, and daughter assist with ADLs, and IADL tasks. Pt. reports assisting with daily care and meal preparation when she is feeling well. Pt. reports nausea, upset stomach, and weakness which limited Pt. Functional performance.  Pt. Has very limited activity tolerance. Pt. Could benefit from OT services for ADL training, A/E training, and pt. education about energy conservation, home modification, and DME. Pt. plans to return home upon discharge with family to assist pt. as needed. Pt. could benefit from follow-up [place] services upon discharge.       Recommendations for follow up therapy are one component of a multi-disciplinary discharge planning process, led by the attending physician.  Recommendations may be updated based on patient status,  additional functional criteria and insurance authorization.   Follow Up Recommendations  Home health OT    Assistance Recommended at Discharge    Patient can return home with the following A little help with walking and/or transfers;A lot of help with bathing/dressing/bathroom;Assistance with cooking/housework;Assist for transportation    Functional Status Assessment  Patient has had a recent decline in their functional status and demonstrates the ability to make significant improvements in function in a reasonable and predictable amount of time.  Equipment Recommendations       Recommendations for Other Services       Precautions / Restrictions Precautions Precautions: Fall Restrictions Weight Bearing Restrictions: No      Mobility Bed Mobility                    Transfers Overall transfer level: Needs assistance Equipment used: Rolling walker (2 wheels) Transfers: Sit to/from Stand Sit to Stand: Supervision, Min assist           General transfer comment: Mobility per PT report      Balance                                           ADL either performed or assessed with clinical judgement   ADL Overall ADL's : Needs assistance/impaired                 Upper Body Dressing : Minimal assistance   Lower Body Dressing: Minimal assistance  Vision Baseline Vision/History: 0 No visual deficits Patient Visual Report: No change from baseline       Perception     Praxis      Pertinent Vitals/Pain Pain Assessment Pain Assessment: Faces Faces Pain Scale: No hurt Pain Descriptors / Indicators: Aching     Hand Dominance Right   Extremity/Trunk Assessment Upper Extremity Assessment Upper Extremity Assessment: Generalized weakness         Communication     Cognition Arousal/Alertness: Awake/alert Behavior During Therapy: Impulsive                                   General  Comments: difficult to assess 2/2 lack of interpreter     General Comments  Pt with continent void on toilet, performs peri hygiene without assistance.    Exercises     Shoulder Instructions      Home Living Family/patient expects to be discharged to:: Private residence Living Arrangements: Children;Spouse/significant other Available Help at Discharge: Family;Available 24 hours/day Type of Home: House Home Access: Stairs to enter [employer] of Steps: 1   Home Layout: Two level Alternate Level Stairs-Number of Steps: flight             Home Equipment: Conservation officer, nature (2 wheels)          Prior Functioning/Environment               Mobility Comments: per pt/son, pt has used a RW in the past, daughter works but husband home during the day to care for pt ADLs Comments: Pt. husband, and daughter assists Pt. with morning care. Pt. performs self-care, and assists with meal preparation when she is feeling better.        OT Problem List: Decreased strength      OT Treatment/Interventions: Self-care/ADL training    OT Goals(Current goals can be found in the care plan section) Acute Rehab OT Goals Patient Stated Goal: To feel better OT Goal Formulation: With patient Time For Goal Achievement: 11/15/21 Potential to Achieve Goals: Good ADL Goals Pt Will Perform Lower Body Dressing: with modified independence Pt Will Transfer to Toilet: with modified independence  OT Frequency: Min 2X/week    Co-evaluation              AM-PAC OT "6 Clicks" Daily Activity     Outcome Measure Help from another person eating meals?: None Help from another person taking care of personal grooming?: A Little Help from another person toileting, which includes using toliet, bedpan, or urinal?: A Little Help from another person bathing (including washing, rinsing, drying)?: A Little Help from another person to put on and taking off regular upper body clothing?: A  Little Help from another person to put on and taking off regular lower body clothing?: A Little 6 Click Score: 19   End of Session Equipment Utilized During Treatment: Gait belt  Activity Tolerance: Patient tolerated treatment well Patient left: in bed  OT Visit Diagnosis: Muscle weakness (generalized) (M62.81)                Time: 9480-1655 OT Time Calculation (min): 25 min Charges:  OT General Charges $OT Visit: 1 Visit  Harrel Carina, MS, OTR/L   Harrel Carina 10/25/2021, 4:38 PM

## 2021-10-25 NOTE — Progress Notes (Signed)
Per Dr Posey Pronto, dc tele monitoring

## 2021-10-26 DIAGNOSIS — J189 Pneumonia, unspecified organism: Secondary | ICD-10-CM | POA: Diagnosis not present

## 2021-10-26 DIAGNOSIS — R197 Diarrhea, unspecified: Secondary | ICD-10-CM

## 2021-10-26 DIAGNOSIS — F419 Anxiety disorder, unspecified: Secondary | ICD-10-CM

## 2021-10-26 LAB — BASIC METABOLIC PANEL
Anion gap: 9 (ref 5–15)
BUN: 6 mg/dL — ABNORMAL LOW (ref 8–23)
CO2: 24 mmol/L (ref 22–32)
Calcium: 9.4 mg/dL (ref 8.9–10.3)
Chloride: 106 mmol/L (ref 98–111)
Creatinine, Ser: 0.64 mg/dL (ref 0.44–1.00)
GFR, Estimated: >60 mL/min (ref >60)
Glucose, Bld: 116 mg/dL — ABNORMAL HIGH (ref 70–99)
Potassium: 3.4 mmol/L — ABNORMAL LOW (ref 3.5–5.1)
Sodium: 139 mmol/L (ref 135–145)

## 2021-10-26 LAB — PHOSPHORUS: Phosphorus: 2.7 mg/dL (ref 2.5–4.6)

## 2021-10-26 LAB — MAGNESIUM: Magnesium: 2 mg/dL (ref 1.7–2.4)

## 2021-10-26 MED ORDER — POTASSIUM CHLORIDE CRYS ER 20 MEQ PO TBCR
40.0000 meq | EXTENDED_RELEASE_TABLET | Freq: Once | ORAL | Status: AC
  Administered 2021-10-26: 40 meq via ORAL
  Filled 2021-10-26: qty 2

## 2021-10-26 MED ORDER — CEFDINIR 300 MG PO CAPS: 300.0000 mg | ORAL_CAPSULE | Freq: Two times a day (BID) | ORAL | 0 refills | Status: AC

## 2021-10-26 MED ORDER — ALPRAZOLAM 0.25 MG PO TABS
0.2500 mg | ORAL_TABLET | Freq: Three times a day (TID) | ORAL | 0 refills | Status: AC | PRN
Start: 2021-10-26 — End: ?

## 2021-10-26 MED ORDER — LOPERAMIDE HCL 2 MG PO CAPS: 2.0000 mg | ORAL_CAPSULE | ORAL | 0 refills | Status: DC | PRN

## 2021-10-26 MED ORDER — AZITHROMYCIN 500 MG PO TABS: 500.0000 mg | ORAL_TABLET | Freq: Every day | ORAL | 0 refills | Status: AC

## 2021-10-26 MED ORDER — LOPERAMIDE HCL 2 MG PO CAPS: 2.0000 mg | ORAL_CAPSULE | Freq: Three times a day (TID) | ORAL | 0 refills | Status: AC | PRN

## 2021-10-26 NOTE — TOC Transition Note (Signed)
Transition of Care [facility]) - CM/SW Discharge Note   Patient Details  Name: Elleni Mozingo MRN: [id number] Date of Birth: 1950/10/08  [facility]) CM/SW Contact:  Izola Price, RN Phone Number: 10/26/2021, 11:30 AM   Clinical Narrative:  9/3: Patient is discharging. Outpatient therapy changing to [facility] via [facility] after speaking with son. Has all needed DME at home. Simmie Davies RN CM      Final next level of care: Home w Home Health Services Barriers to Discharge: Barriers Resolved   Patient Goals and CMS Choice   CMS Medicare.gov Compare Post Acute Care list provided to::  (Son) Choice offered to / list presented to : Adult Children  Discharge Placement                       Discharge Plan and Services                DME Arranged: N/A DME Agency: NA       HH Arranged: PT, OT HH Agency: [facility] ([facility])        Social Determinants of Health (SDOH) Interventions     Readmission Risk Interventions     No data to display

## 2021-10-26 NOTE — Progress Notes (Signed)
Patient is being discharged home with [facility]. Discharge papers given and explained to patient and spouse. Interpreter utilized. Med and f/u appointments reviewed. Rx given. Awaiting son to transport patient.

## 2021-10-26 NOTE — Discharge Summary (Addendum)
Physician Discharge Summary   Patient: Sara Schroeder MRN: [id number] DOB: November 15, 1950  Admit date:     10/24/2021  Discharge date: 10/26/21  Discharge Physician: George Hugh   PCP: Services, [facility]   Recommendations at discharge:   Please take Cefdinir 300 mg BID and Azithromycin 500 mg QD x 4 days to complete 7 day course of treatment for Pneumonia. Please take Imodium PRN for diarrhea.   Please hold Tagrisso/Osimertinib temporarily as this is likely contributing to diarrhea and follow up with Oncologist Dr. Rogue Bussing this week to discuss alternative treatment options. Please take Xanax 0.25 mg TID PRN for anxiety. Please discontinue all blood pressure medications due to hypotension.  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 Rogue Bussing) who presents to the ED on 9/1 with acute progressively worsening shortness of breath over the last three days associated with a mild cough.  Chest imaging showed mild pneumonia.  She also reports non-bloody watery diarrhea associated with abdominal cramps for two weeks possibly due to [place].  Sara Schroeder states she stopped taking her chemotherapy medications recently due to the diarrhea.  Abdominal CT showed no acute abnormality.    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 - Follow up with Heme/Onc Dr. Rogue Bussing this week to discuss alternative medication.   Acute Diarrhea most likely due to chemo medication:   Abdominal CT showed no acute abnormality. - Diarrhea improved with fluids and supportive care. - Patient can take Imodium as needed for diarrhea.   Community Acquired Pneumonia: Chest CT showed no evidence of a PE. - Patient received 3 days of IV Unasyn and Azithromycin 500 mg daily.  She was discharged on Cefdinir 300 mg BID and Azithromycin 500 mg daily to complete 7 day course.   Hypokalemia, replaced.   Essential Hypertension: Blood pressure is running low.  It was elevated due to uncontrolled anxiety. - Discontinue all home blood pressure medications.  Anxiety: Patient states the Xanax worked immediately and she would like to continue it. - I will print a prescription of Xanax 0.25 mg TID PRN for one week.   - She can continue her home medication SSRI if she wants to.   Insomnia: - Continue Trazodone 50 mg PRN.   7 mm Pancreatic Pseudocyst:  - Monitor outpatient.    Degenerative Disc Disease:  - Pain medications PRN.   Atherosclerotic Disease   Body mass index is 20.22 kg/m.    Pain control - [employer] Controlled Substance Reporting System database was reviewed. and patient was instructed, not to drive, operate heavy machinery, perform activities at heights, swimming or participation in water activities or provide baby-sitting services while on Pain, Sleep and Anxiety Medications; until their outpatient Physician has advised to do so again. Also recommended to not to take more than prescribed Pain, Sleep and Anxiety Medications.  Consultants: None Procedures performed: None Disposition: Home health with PT/OT services Diet recommendation:  Discharge Diet Orders (From admission, onward)     Start     Ordered  10/26/21 0000  Diet - low sodium heart healthy        10/26/21 1116           Regular diet DISCHARGE MEDICATION: Allergies as of 10/26/2021       Reactions   Lisinopril Other (See Comments)   Per med list from [facility]          Medication List     STOP taking these medications    FLUoxetine 20 MG tablet Commonly known as: PROZAC   hydrochlorothiazide 25 MG tablet Commonly known as: HYDRODIURIL   LORazepam 1 MG tablet Commonly known as: ATIVAN   propranolol 10 MG tablet Commonly known as: INDERAL   Tagrisso 40 MG tablet Generic drug: osimertinib mesylate       TAKE these medications    acetaminophen 325 MG tablet Commonly known as: TYLENOL Take 2 tablets (650 mg total) by mouth every 4 (four) hours as needed for mild pain (temp > 101.5).   ALPRAZolam 0.25 MG tablet Commonly known as: XANAX Take 1 tablet (0.25 mg total) by mouth 3 (three) times daily as needed for anxiety.   azithromycin 500 MG tablet Commonly known as: Zithromax Take 1 tablet (500 mg total) by mouth daily for 4 days. Take 1 tablet daily for 3 days.   cefdinir 300 MG capsule Commonly known as: OMNICEF Take 1 capsule (300 mg total) by mouth 2 (two) times daily for 4 days.   Centrum Silver 50+Women Tabs Take 1 tablet by mouth daily.   docusate sodium 100 MG capsule Commonly known as: COLACE Take 1 capsule (100 mg total) by mouth 2 (two) times daily.   loperamide 2 MG capsule Commonly known as: IMODIUM Take 1 capsule (2 mg total) by mouth as needed for up to 7 days for diarrhea or loose stools.   omeprazole 20 MG capsule Commonly known as: PRILOSEC TAKE 1 CAPSULE BY MOUTH EVERY DAY   ondansetron 4 MG tablet Commonly known as: Zofran Take 1 tablet (4 mg total) by mouth every 6 (six) hours as needed for nausea or vomiting.   oxyCODONE 5 MG immediate release tablet Commonly known as: Oxy IR/ROXICODONE Take 1 tablet (5 mg total) by mouth every 6 (six) hours as needed for severe pain.   sertraline 50 MG tablet Commonly known as: ZOLOFT Take 50 mg by mouth daily.   traZODone 50 MG tablet Commonly known as: DESYREL Take 2 tablets (100 mg total) by mouth at bedtime.        Discharge Exam: Filed Weights    10/24/21 1116 10/24/21 1123  Weight: 61.2 kg 48.5 kg   General exam: chronically ill appearing, no acute distress HEENT: NCAT, PERRL Respiratory system: clear to auscultation bilaterally Cardiovascular system: Did not appreciate a murmur, regular, No JVD. Gastrointestinal system: No flank pain, Abdomen soft, NT,ND, BS+. Nervous System: No focal deficits. Extremities: No edema, distal peripheral pulses palpable.  Skin: No rashes, No bruises, No icterus. MSK: Physical Deconditioning  Condition at discharge: stable  The results of significant diagnostics from this hospitalization (including imaging, microbiology, ancillary and laboratory) are listed below for reference.   Imaging Studies: CT Angio Chest PE W/Cm &/Or Wo Cm  Result Date: 10/25/2021 CLINICAL DATA:  71 year old female with chest pain, shortness of breath. Metastatic lung cancer. EXAM: CT ANGIOGRAPHY CHEST WITH CONTRAST TECHNIQUE: Multidetector CT imaging of the chest was performed using the standard protocol during bolus administration of intravenous contrast. Multiplanar CT image reconstructions and MIPs were obtained to evaluate  the vascular anatomy. RADIATION DOSE REDUCTION: This exam was performed according to the departmental dose-optimization program which includes automated exposure control, adjustment of the mA and/or kV according to patient size and/or use of iterative reconstruction technique. CONTRAST:  107mL OMNIPAQUE IOHEXOL 350 MG/ML SOLN COMPARISON:  CT Abdomen and Pelvis yesterday. Restaging CT Chest, Abdomen, and Pelvis 08/25/2021. FINDINGS: Cardiovascular: Excellent contrast bolus timing in the pulmonary arterial tree. No pulmonary artery filling defect. Calcified coronary artery atherosclerosis on series 6, image 196. No cardiomegaly or pericardial effusion. Mild tortuosity of the thoracic aorta with atherosclerosis. Left vertebral artery arises directly from the arch (normal variant). Mediastinum/Nodes: Stable.  Small retrosternal extension of the thyroid to the right of midline. No discrete mediastinal mass or lymphadenopathy. Lungs/Pleura: Mildly lower lung volumes compared to July. Major airways remain patent. Platelike peribronchial opacity along the right minor fissure with regional bronchiectasis appears unchanged since July. Minor left lung dependent atelectasis now. No other acute pulmonary opacity. No pleural effusion. Upper Abdomen: Stable visible upper abdominal viscera. Musculoskeletal: No acute or suspicious osseous lesion identified. Review of the MIP images confirms the above findings. IMPRESSION: 1. Negative for acute pulmonary embolus. 2. Stable since July post treatment appearance of the Chest. 3. Calcified coronary artery atherosclerosis Aortic Atherosclerosis (ICD10-I70.0). Electronically Signed   By: Genevie Ann M.D.   On: 10/25/2021 08:50   CT Abdomen Pelvis W Contrast  Result Date: 10/24/2021 CLINICAL DATA:  Left lower quadrant abdominal pain. History of metastatic lung cancer. EXAM: CT ABDOMEN AND PELVIS WITH CONTRAST TECHNIQUE: Multidetector CT imaging of the abdomen and pelvis was performed using the standard protocol following bolus administration of intravenous contrast. RADIATION DOSE REDUCTION: This exam was performed according to the departmental dose-optimization program which includes automated exposure control, adjustment of the mA and/or kV according to patient size and/or use of iterative reconstruction technique. CONTRAST:  125mL OMNIPAQUE IOHEXOL 300 MG/ML  SOLN COMPARISON:  CT 08/25/2021. FINDINGS: Lower chest: Scarring identified within the inferior right middle lobe. No acute abnormality. Hepatobiliary: No focal liver abnormality is seen. No gallstones, gallbladder wall thickening, or biliary dilatation. Pancreas: Stable 7 mm low-density lesion arising off the pancreatic tail, image 15/2. No pancreatic ductal dilatation or surrounding inflammatory changes. Spleen: Normal in size  without focal abnormality. Adrenals/Urinary Tract: Normal adrenal glands. No nephrolithiasis or hydronephrosis. No suspicious kidney mass. Urinary bladder is unremarkable. Stomach/Bowel: Stomach appears normal. The appendix is visualized and is within normal limits. There is no bowel wall thickening, inflammation, or distension. Vascular/Lymphatic: Aortic atherosclerosis. No aneurysm. No signs of abdominopelvic adenopathy. Reproductive: Uterus and bilateral adnexa are unremarkable. Other: No free fluid or fluid collections. No signs of pneumoperitoneum. Musculoskeletal: Degenerative disc disease noted at L4-5. No acute or suspicious osseous findings. IMPRESSION: 1. No acute findings within the abdomen or pelvis. 2. Stable 7 mm low-density lesion arising off the pancreatic tail. Unchanged from previous exam most likely representing a small IPMN or pseudocyst. Suggest attention on follow-up imaging. 3. Aortic Atherosclerosis (ICD10-I70.0). Electronically Signed   By: Kerby Moors M.D.   On: 10/24/2021 14:49   DG Chest 2 View  Result Date: 10/24/2021 CLINICAL DATA:  Weakness and constipation. EXAM: CHEST - 2 VIEW COMPARISON:  07/05/2019 FINDINGS: Lungs are hyperexpanded. Bandlike atelectasis or scarring noted parahilar right mid lung. Patchy airspace disease is seen in the infrahilar right lung and left base. No pulmonary edema. No pleural effusion. The cardiopericardial silhouette is within normal limits for size. The visualized bony structures of the thorax are unremarkable. IMPRESSION: Patchy  airspace disease in the infrahilar right lung and left base. Multifocal pneumonia concern. Electronically Signed   By: Misty Stanley M.D.   On: 10/24/2021 12:01    Microbiology: Results for orders placed or performed during the hospital encounter of 06/27/19  MRSA PCR Screening     Status: None   Collection Time: 06/27/19  3:52 AM   Specimen: Nasal Mucosa; Nasopharyngeal  Result Value Ref Range Status   MRSA by PCR  NEGATIVE NEGATIVE Final    Comment:        The GeneXpert MRSA Assay (FDA approved for NASAL specimens only), is one component of a comprehensive MRSA colonization surveillance program. It is not intended to diagnose MRSA infection nor to guide or monitor treatment for MRSA infections. Performed at [facility], [place] [address] [place], [place] [address]     Labs: CBC: Recent Labs  Lab 10/24/21 1132 10/25/21 0430  WBC 5.8 8.0  HGB 12.3 10.6*  HCT 38.1 32.1*  MCV 94.5 94.4  PLT 194 468   Basic Metabolic Panel: Recent Labs  Lab 10/24/21 1132 10/25/21 0430 10/26/21 0351  NA 143 142 139  K 3.2* 3.3* 3.4*  CL 111 113* 106  CO2 24 26 24   GLUCOSE 105* 97 116*  BUN 9 10 6*  CREATININE 0.61 0.67 0.64  CALCIUM 9.1 8.8* 9.4  MG  --   --  2.0  PHOS  --   --  2.7   Liver Function Tests: Recent Labs  Lab 10/24/21 1132  AST 24  ALT 15  ALKPHOS 61  BILITOT 1.3*  PROT 7.6  ALBUMIN 4.4    Discharge time spent: greater than 30 minutes.  Signed: George Hugh, MD [facility] 10/26/2021

## 2021-10-28 ENCOUNTER — Other Ambulatory Visit: Payer: Self-pay | Admitting: Internal Medicine

## 2021-10-28 ENCOUNTER — Other Ambulatory Visit (HOSPITAL_COMMUNITY): Payer: Self-pay

## 2021-10-29 ENCOUNTER — Other Ambulatory Visit (HOSPITAL_COMMUNITY): Payer: Self-pay

## 2021-10-29 ENCOUNTER — Other Ambulatory Visit: Payer: Self-pay | Admitting: Pharmacist

## 2021-10-29 DIAGNOSIS — C342 Malignant neoplasm of middle lobe, bronchus or lung: Secondary | ICD-10-CM

## 2021-10-29 MED ORDER — OSIMERTINIB MESYLATE 40 MG PO TABS
40.0000 mg | ORAL_TABLET | Freq: Every day | ORAL | 3 refills | Status: AC
  Filled 2021-10-29: qty 30, 30d supply, fill #0

## 2021-10-29 NOTE — Progress Notes (Signed)
Oral Chemotherapy Pharmacist Encounter   [facility] reached out because they had a Tagrisso set to ship but the order was discontinue by a hospitalist following discharge from the hospital.   Spoke with Dr. Rogue Bussing and he would like for the patient to continue the her [place]. Rx sent to [facility] so they can continue to process the Rx.    Darl Pikes, PharmD, BCPS, BCOP, CPP Hematology/Oncology Clinical Pharmacist [place]/DB/AP [facility] [phone]  10/29/2021 9:07 AM

## 2021-11-03 ENCOUNTER — Ambulatory Visit: Payer: Medicare Other | Admitting: Internal Medicine

## 2021-11-07 ENCOUNTER — Inpatient Hospital Stay: Payer: Medicare Other | Admitting: Internal Medicine

## 2021-11-17 ENCOUNTER — Other Ambulatory Visit (HOSPITAL_COMMUNITY): Payer: Self-pay

## 2021-11-21 ENCOUNTER — Encounter: Payer: Self-pay | Admitting: Internal Medicine

## 2021-11-21 ENCOUNTER — Inpatient Hospital Stay: Payer: Medicare Other | Attending: Internal Medicine | Admitting: Internal Medicine

## 2021-11-21 VITALS — BP 130/74 | HR 69 | Temp 98.2°F | Resp 16

## 2021-11-21 DIAGNOSIS — D649 Anemia, unspecified: Secondary | ICD-10-CM | POA: Insufficient documentation

## 2021-11-21 DIAGNOSIS — C7931 Secondary malignant neoplasm of brain: Secondary | ICD-10-CM | POA: Insufficient documentation

## 2021-11-21 DIAGNOSIS — C3490 Malignant neoplasm of unspecified part of unspecified bronchus or lung: Secondary | ICD-10-CM | POA: Diagnosis not present

## 2021-11-21 DIAGNOSIS — C342 Malignant neoplasm of middle lobe, bronchus or lung: Secondary | ICD-10-CM | POA: Diagnosis present

## 2021-11-21 DIAGNOSIS — K5900 Constipation, unspecified: Secondary | ICD-10-CM | POA: Diagnosis not present

## 2021-11-21 DIAGNOSIS — Z79899 Other long term (current) drug therapy: Secondary | ICD-10-CM | POA: Insufficient documentation

## 2021-11-21 NOTE — Progress Notes (Signed)
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  A CT of the chest, abdomen, pelvis without contrast was performed on 06/27/2019 which showed a spiculated right middle lobe pulmonary nodule measuring 11 x 11 x 10 mm.  This had been present on a CT scan performed on 03/01/2018.  There was no evidence of metastatic disease or primary malignancy in the chest, abdomen, pelvis.  Additionally, the patient had a exophytic 61m nodule from the thyroid isthmus.  The patient underwent a left frontal craniotomy for tumor resection on 06/29/2019.  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  Patient does not speak [place]  Accompanied by brother. In a wheelchair.  Sara Schroeder 70 y.o.  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  Pt is off osimertinib for last 3 weeks as she was admitted to hospital for  diarrhea/dehydration/abdominal pain.    Denies any worsening headaches.  Denies nausea vomiting. Denies any shortness of breath or cough.  Review of Systems  Constitutional:  Positive for malaise/fatigue. Negative for chills, diaphoresis and fever.  HENT:  Negative for nosebleeds and sore throat.   Eyes:  Negative for double vision.  Respiratory:  Negative for cough, hemoptysis, sputum production and wheezing.   Cardiovascular:  Negative for chest pain, palpitations, orthopnea and leg swelling.  Gastrointestinal:  Negative for abdominal pain, blood in stool, diarrhea, heartburn, melena and vomiting.  Genitourinary:  Negative for dysuria, frequency and urgency.  Musculoskeletal:  Positive for back pain and joint pain.  Skin: Negative.  Negative for itching and rash.  Neurological:  Negative for tingling, focal weakness, weakness and headaches.  Endo/Heme/Allergies:  Does not bruise/bleed easily.  Psychiatric/Behavioral:  Negative for depression. The patient is not nervous/anxious and does not have insomnia.      MEDICAL HISTORY:  Past Medical History:  Diagnosis Date   Anxiety    Hypertension    Lung cancer ([place]    Right Middle Lobe    SURGICAL HISTORY: Past Surgical History:  Procedure Laterality Date   APPLICATION OF CRANIAL NAVIGATION N/A 06/29/2019   Procedure: APPLICATION OF CRANIAL NAVIGATION;  Surgeon: OJudith Part MD;  [facility] [facility]  Service: Neurosurgery;  Laterality: N/A;   CRANIOTOMY Left 06/29/2019   Procedure: LEFT CRANIOTOMY FOR TUMOR EXCISION;  Surgeon: OJudith Part MD;  [place] [facility]  Service: Neurosurgery;  Laterality: Left;    SOCIAL HISTORY: Social History   Socioeconomic History  Marital status: Married    Spouse name: Not on file   Number of children: Not on file   Years of education: Not on file   Highest education level: Not on file  Occupational History   Not on file  Tobacco Use   Smoking status: Never   Smokeless tobacco:  Never  Vaping Use   Vaping Use: Never used  Substance and Sexual Activity   Alcohol use: Never   Drug use: Never   Sexual activity: Not Currently    Birth control/protection: Post-menopausal  Other Topics Concern   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  She lives with her children in [place].   Social Determinants of Health   Financial Resource Strain: Not on file  Food Insecurity: Not on file  Transportation Needs: Not on file  Physical Activity: Not on file  Stress: Not on file  Social Connections: Not on file  Intimate Partner Violence: Not on file    FAMILY HISTORY: Family History  Problem Relation Age of Onset   Cancer Son        some type of cancer involving the brain    ALLERGIES:  is allergic to lisinopril.  MEDICATIONS:  Current Outpatient Medications  Medication Sig Dispense Refill   LORazepam (ATIVAN) 1 MG tablet Take 1 mg by mouth 2 (two) times daily.     propranolol (INDERAL) 10 MG tablet Take 10 mg by mouth 2 (two) times daily.     traZODone (DESYREL) 50 MG tablet Take 2 tablets (100 mg total) by mouth at bedtime. 30 tablet 0   acetaminophen (TYLENOL) 325 MG tablet Take 2 tablets (650 mg total) by mouth every 4 (four) hours as needed for mild pain (temp > 101.5). (Patient not taking: Reported on 09/25/2021)     ALPRAZolam (XANAX) 0.25 MG tablet Take 1 tablet (0.25 mg total) by mouth 3 (three) times daily as needed for anxiety. (Patient not taking: Reported on 11/21/2021) 30 tablet 0   docusate sodium (COLACE) 100 MG capsule Take 1 capsule (100 mg total) by mouth 2 (two) times daily. (Patient not taking: Reported on 09/25/2021) 10 capsule 0   Multiple Vitamins-Minerals (CENTRUM SILVER 50+WOMEN) TABS Take 1 tablet by mouth daily. (Patient not taking: Reported on 09/25/2021) 30 tablet 4   omeprazole (PRILOSEC) 20 MG capsule TAKE 1 CAPSULE BY MOUTH EVERY DAY (Patient not taking: Reported on  09/25/2021) 90 capsule 1   ondansetron (ZOFRAN) 4 MG tablet Take 1 tablet (4 mg total) by mouth every 6 (six) hours as needed for nausea or vomiting. (Patient not taking: Reported on 04/22/2021) 20 tablet 2   osimertinib mesylate (TAGRISSO) 40 MG tablet Take 1 tablet (40 mg total) by mouth daily. (Patient not taking: Reported on 11/21/2021) 30 tablet 3   oxyCODONE (OXY IR/ROXICODONE) 5 MG immediate release tablet Take 1 tablet (5 mg total) by mouth every 6 (six) hours as needed for severe pain. (Patient not taking: Reported on 04/22/2021) 45 tablet 0   sertraline (ZOLOFT) 50 MG tablet Take 50 mg by mouth daily. (Patient not taking: Reported on 10/24/2021)     No current facility-administered medications for this visit.      Marland Kitchen  PHYSICAL EXAMINATION: ECOG PERFORMANCE STATUS: 2 - Symptomatic, <50% confined to bed  Vitals:   11/21/21 1500  BP: 130/74  Pulse: 69  Resp: 16  Temp: 98.2 F (36.8 C)   Filed Weights  Physical Exam HENT:     Head: Normocephalic and atraumatic.     Mouth/Throat:     Pharynx: No oropharyngeal exudate.  Eyes:     Pupils: Pupils are equal, round, and reactive to light.  Cardiovascular:     Rate and Rhythm: Normal rate and regular rhythm.  Pulmonary:     Effort: Pulmonary effort is normal. No respiratory distress.     Breath sounds: Normal breath sounds. No wheezing.  Abdominal:     General: Bowel sounds are normal. There is no distension.     Palpations: Abdomen is soft. There is no mass.     Tenderness: There is no abdominal tenderness. There is no guarding or rebound.  Musculoskeletal:        General: No tenderness. Normal range of motion.     Cervical back: Normal range of motion and neck supple.  Skin:    General: Skin is warm.  Neurological:     Mental Status: She is alert and oriented to person, place, and time.  Psychiatric:        Mood and Affect: Affect normal.      LABORATORY DATA:  I have reviewed the data as listed Lab Results   Component Value Date   WBC 8.0 10/25/2021   HGB 10.6 (L) 10/25/2021   HCT 32.1 (L) 10/25/2021   MCV 94.4 10/25/2021   PLT 169 10/25/2021   Recent Labs    04/22/21 1417 08/25/21 1115 09/25/21 1106 10/24/21 1132 10/25/21 0430 10/26/21 0351  NA 137  --  139 143 142 139  K 3.4*  --  4.0 3.2* 3.3* 3.4*  CL 103  --  106 111 113* 106  CO2 26  --  _0 GLUCOSE 113*  --  89 105* 97 116*  BUN 13  --  _1 6*  CREATININE 0.81   < > 0.61 0.61 0.67 0.64  CALCIUM 9.4  --  9.5 9.1 8.8* 9.4  GFRNONAA >60  --  >60 >60 >60 >60  PROT 7.4  --  7.6 7.6  --   --   ALBUMIN 4.4  --  4.5 4.4  --   --   AST 28  --  28 24  --   --   ALT 28  --  25 15  --   --   ALKPHOS 76  --  74 61  --   --   BILITOT 0.4  --  0.7 1.3*  --   --    < > = values in this interval not displayed.    RADIOGRAPHIC STUDIES: I have personally reviewed the radiological images as listed and agreed with the findings in the report.   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 Currently Osimertinib 40 mg/day  CT scan CAP-SEP 2023 [in ER] No significant change in flattened, bandlike post treatment appearance of the lateral segment right middle lobe; no evidence of disease noted in the chest and pelvis.   # currently OFF osimertinib [sec to diarrhea/abdominal pain]- improved. Given Poor tolerance; and stable disease as noted on the CT sep 2023- would recommend HOLDING therapy at this time. And continue surveillance at this time.   # Anemia- Hb 11.6- monitor for now; if worse would recommend EGD if worse [given CT dec 2022- gatsric thickening]  -stable.   # Cystic  lesion in the tail of the pancreas measuring 7 mm  no main duct dilation or adjacent stranding.SEP 2023-CT scan-no obvious concerns- STABLE.   # Brain metastases-status post resection followed by SBRT.   MRI AUG 2022- No progression.  Monitor for now; repeat MRI of the brain in 4 months/next visit. STABLE.   # Constipation- recommend Miralax prn.   # DISPOSITION: # follow up in 4 months- - MD: labs- cbc/cmp.mag; CT Chest;  MRI brain prior-Dr.B  # I reviewed the blood work- with the patient in detail; also reviewed the imaging independently [as summarized above]; and with the patient in detail.        All questions were answered. The patient knows to call the clinic with any problems, questions or concerns.   Cammie Sickle, MD 11/21/2021 4:11 PM

## 2021-11-21 NOTE — Progress Notes (Signed)
Patient did not want to obtain her weight today.  Appetite has improved.    Not taking Tagrisso at this time due to symptoms/side effects of constipation and heart palpitations.

## 2021-11-21 NOTE — Assessment & Plan Note (Addendum)
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 Currently Osimertinib 40 mg/day  CT scan CAP-SEP 2023 [in ER] No significant change in flattened, bandlike post treatment appearance of the lateral segment right middle lobe; no evidence of disease noted in the chest and pelvis.   # currently OFF osimertinib [sec to diarrhea/abdominal pain]- improved. Given Poor tolerance; and stable disease as noted on the CT sep 2023- would recommend HOLDING therapy at this time. And continue surveillance at this time.   # Anemia- Hb 11.6- monitor for now; if worse would recommend EGD if worse [given CT dec 2022- gatsric thickening]  -stable.  # Cystic lesion in the tail of the pancreas measuring 7 mm  no main duct dilation or adjacent stranding.SEP 2023-CT scan-no obvious concerns- STABLE.   # Brain metastases-status post resection followed by SBRT.  MRI AUG 2022- No progression.  Monitor for now; repeat MRI of the brain in 4 months/next visit. STABLE.   # Constipation- recommend Miralax prn.   # DISPOSITION: # follow up in 4 months- - MD: labs- cbc/cmp.mag; CT Chest;  MRI brain prior-Dr.B  # I reviewed the blood work- with the patient in detail; also reviewed the imaging independently [as summarized above]; and with the patient in detail.

## 2021-11-21 NOTE — Patient Instructions (Signed)
DO NOT TAKE TAGRISSO.

## 2021-11-24 ENCOUNTER — Other Ambulatory Visit (HOSPITAL_COMMUNITY): Payer: Self-pay

## 2021-11-26 ENCOUNTER — Other Ambulatory Visit (HOSPITAL_COMMUNITY): Payer: Self-pay

## 2021-12-15 ENCOUNTER — Telehealth: Payer: Self-pay | Admitting: *Deleted

## 2021-12-15 NOTE — Telephone Encounter (Signed)
Pt's son, Day, left message stating that pt would like to cancel her appts on 10/27 for brain MRI and CT chest. States that pt does not feel well and will call back to reschedule appts once able.

## 2021-12-19 ENCOUNTER — Ambulatory Visit: Payer: Medicare Other

## 2021-12-22 ENCOUNTER — Other Ambulatory Visit (HOSPITAL_COMMUNITY): Payer: Self-pay

## 2021-12-26 ENCOUNTER — Ambulatory Visit: Payer: Medicare Other | Admitting: Internal Medicine

## 2021-12-26 ENCOUNTER — Other Ambulatory Visit: Payer: Medicare Other

## 2022-01-14 ENCOUNTER — Other Ambulatory Visit (HOSPITAL_COMMUNITY): Payer: Self-pay

## 2022-03-23 ENCOUNTER — Inpatient Hospital Stay: Payer: Medicare Other | Admitting: Internal Medicine

## 2022-03-23 ENCOUNTER — Inpatient Hospital Stay: Payer: Medicare Other | Attending: Internal Medicine

## 2022-08-10 ENCOUNTER — Other Ambulatory Visit (HOSPITAL_COMMUNITY): Payer: Self-pay

## 2024-02-22 ENCOUNTER — Emergency Department
Admission: EM | Admit: 2024-02-22 | Discharge: 2024-02-22 | Disposition: A | Discharge status: Home / Self Care | Attending: Emergency Medicine | Admitting: Emergency Medicine

## 2024-02-22 ENCOUNTER — Other Ambulatory Visit: Payer: Self-pay

## 2024-02-22 ENCOUNTER — Emergency Department

## 2024-02-22 DIAGNOSIS — S29011A Strain of muscle and tendon of front wall of thorax, initial encounter: Secondary | ICD-10-CM | POA: Insufficient documentation

## 2024-02-22 DIAGNOSIS — W19XXXA Unspecified fall, initial encounter: Secondary | ICD-10-CM

## 2024-02-22 DIAGNOSIS — F4321 Adjustment disorder with depressed mood: Secondary | ICD-10-CM | POA: Diagnosis not present

## 2024-02-22 DIAGNOSIS — Z85118 Personal history of other malignant neoplasm of bronchus and lung: Secondary | ICD-10-CM | POA: Insufficient documentation

## 2024-02-22 DIAGNOSIS — W06XXXA Fall from bed, initial encounter: Secondary | ICD-10-CM | POA: Insufficient documentation

## 2024-02-22 DIAGNOSIS — E039 Hypothyroidism, unspecified: Secondary | ICD-10-CM | POA: Diagnosis not present

## 2024-02-22 DIAGNOSIS — T1490XA Injury, unspecified, initial encounter: Secondary | ICD-10-CM

## 2024-02-22 LAB — BASIC METABOLIC PANEL WITH GFR
Anion gap: 13 (ref 5–15)
BUN: 12 mg/dL (ref 8–23)
CO2: 25 mmol/L (ref 22–32)
Calcium: 9.7 mg/dL (ref 8.9–10.3)
Chloride: 104 mmol/L (ref 98–111)
Creatinine, Ser: 0.63 mg/dL (ref 0.44–1.00)
GFR, Estimated: >60 mL/min
Glucose, Bld: 104 mg/dL — ABNORMAL HIGH (ref 70–99)
Potassium: 3.9 mmol/L (ref 3.5–5.1)
Sodium: 142 mmol/L (ref 135–145)

## 2024-02-22 LAB — CBC
HCT: 39.3 % (ref 36.0–46.0)
Hemoglobin: 13 g/dL (ref 12.0–15.0)
MCH: 31.1 pg (ref 26.0–34.0)
MCHC: 33.1 g/dL (ref 30.0–36.0)
MCV: 94 fL (ref 80.0–100.0)
Platelets: 229 K/uL (ref 150–400)
RBC: 4.18 MIL/uL (ref 3.87–5.11)
RDW: 14.1 % (ref 11.5–15.5)
WBC: 6.1 K/uL (ref 4.0–10.5)
nRBC: 0 % (ref 0.0–0.2)

## 2024-02-22 LAB — TROPONIN T, HIGH SENSITIVITY: Troponin T High Sensitivity: <15 ng/L (ref 0–19)

## 2024-02-22 MED ORDER — LIDOCAINE 5 % EX PTCH: 1.0000 | MEDICATED_PATCH | Freq: Two times a day (BID) | CUTANEOUS | 0 refills | Status: AC

## 2024-02-22 MED ORDER — ACETAMINOPHEN ER 650 MG PO TBCR: 650.0000 mg | EXTENDED_RELEASE_TABLET | Freq: Three times a day (TID) | ORAL | 0 refills | Status: AC | PRN

## 2024-02-22 MED ORDER — ACETAMINOPHEN 325 MG PO TABS
650.0000 mg | ORAL_TABLET | Freq: Once | ORAL | Status: AC
  Administered 2024-02-22: 650 mg via ORAL
  Filled 2024-02-22: qty 2

## 2024-02-22 MED ORDER — LIDOCAINE 5 % EX PTCH
1.0000 | MEDICATED_PATCH | CUTANEOUS | Status: DC
  Administered 2024-02-22: 1 via TRANSDERMAL
  Filled 2024-02-22: qty 1

## 2024-02-22 NOTE — ED Triage Notes (Signed)
 Pt presents to the ED via POV from home. Pt reports tripping and falling out of bed last night on her right side. Pt reports SHOB that started after she fell. Reports some pain in her right rib cage. Breath sounds auscultated bilaterally. Pt reports chest pain.  Darice Interpreter [phone]

## 2024-02-22 NOTE — Discharge Instructions (Addendum)
 You have been diagnosed with a fall, chest wall muscle strain, grieving.  Please apply lidocaine  patch in the right side of your chest.  Please take acetaminophen  1 tablet by mouth every 8 hours as needed for pain.  Please drink plenty of fluids and eat.  Please make an appointment your primary care physician for a follow-up of your grieving.  Please come back to ED or go to your PCP if you have new symptoms symptoms worsen.

## 2024-02-22 NOTE — ED Notes (Signed)
 See triage note Presents with right rib pain s/p fall from bed  States this is making her SOB

## 2024-02-22 NOTE — ED Provider Notes (Signed)
 "   [facility] Provider Note    Event Date/Time   First MD Initiated Contact with Patient 02/22/24 1518     (approximate)   History   Shortness of Breath    HPI  Sara Schroeder is a 73 y.o. female    with a past medical history of lung cancer metastatic to brain, pneumonia, primary adenocarcinoma of middle lobe of right lung, seizures, acute epoxy respiratory failure, anxiety, hypothyroidism, who presents to the ED complaining of shortness of breath after falling from bed. According to the patient, symptoms of shortness of breath, fatigue, tiredness, started before the fall.  Patient endorses her youngest son died 4 months ago and the symptoms are worse when she started thinking about him.  Patient fell last night, today she is having right gluteal pain and right anterior chest pain.  Patient is speaks [place].  I medicated with the patient through the interpreter Sara Schroeder ID [phone].  Patient is here with her daughter. Patient is taking at night trazodone , mirtazapine, lorazepam .  Patient is taking for blood pressure amlodipine  and propranolol .  Patient denies fever, chills, cough, abdominal pain, urinary symptoms or diarrhea.  Patient denies loss of consciousness, blurry vision, nausea or vomit.     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 (HCC) 07/06/2019   Malignant neoplasm metastatic to brain (HCC) 07/06/2019   Acute hypoxemic respiratory failure (HCC)    Intracerebral hemorrhage (HCC) 06/27/2019     Physical Exam   Triage Vital Signs: ED Triage Vitals  Encounter Vitals Group     BP 02/22/24 1416 (!) 171/81     Girls Systolic BP Percentile --      Girls Diastolic BP Percentile --      Boys  Systolic BP Percentile --      Boys Diastolic BP Percentile --      Pulse Rate 02/22/24 1416 82     Resp 02/22/24 1416 18     Temp 02/22/24 1416 98.2 F (36.8 C)     Temp Source 02/22/24 1416 Oral     SpO2 02/22/24 1416 97 %     Weight 02/22/24 1531 106 lb 14.8 oz (48.5 kg)     Height 02/22/24 1421 5' 1 (1.549 m)     Head Circumference --      Peak Flow --      Pain Score 02/22/24 1416 5     Pain Loc --      Pain Education --      Exclude from Growth Chart --     Most recent vital signs: Vitals:   02/22/24 1416 02/22/24 1705  BP: (!) 171/81   Pulse: 82   Resp: 18   Temp: 98.2 F (36.8 C)   SpO2: 97% 98%     Physical Exam Vitals and nursing note reviewed.  During triage patient had systolic hypertension, no tachypnea no tachycardic saturations 97%  General:          Awake, no distress.  Eyes: PERRLA Mouth: No peritonsillar enlargement. CV:                  Good peripheral perfusion. Regular rate and rhythm. Resp:  Normal effort. no tachypnea.Equal breath sounds bilaterally.  Abd:                 No distention.  Soft nontender Other:    Right anterior chest: Skin is intact, no ecchymosis or hematomas, no depressions.  Tenderness to palpation at the level of the eighth rib with anterior axillary line.  Deep breath did not increase pain. Lumbar spine: Skin is intact, no ecchymosis or hematomas.  Tenderness to palpation in the right gluteal area.  Sensation is intact.  Patient is able to walk.  Full ROM.  Pulses positive           ED Results / Procedures / Treatments   Labs (all labs ordered are listed, but only abnormal results are displayed) Labs Reviewed  BASIC METABOLIC PANEL WITH GFR - Abnormal; Notable for the following components:      Result Value   Glucose, Bld 104 (*)    All other components within normal limits  CBC  TROPONIN T, HIGH SENSITIVITY  TROPONIN T, HIGH SENSITIVITY     EKG See physician read  Vent. rate 83 BPM  PR interval 130  ms  QRS duration 70 ms  QT/QTcB 370/434 ms  P-R-T axes 54 71 3   RADIOLOGY I independently reviewed and interpreted imaging and agree with radiologists findings.      PROCEDURES:  Critical Care performed:   Procedures   MEDICATIONS ORDERED IN ED: Medications  lidocaine  (LIDODERM ) 5 % 1 patch (1 patch Transdermal Patch Applied 02/22/24 1707)  acetaminophen  (TYLENOL ) tablet 650 mg (650 mg Oral Given 02/22/24 1706)   Clinical Course as of 02/22/24 1709  Tue Feb 22, 2024  1608 Basic metabolic panel(!) Electrolytes, renal function, anion gap within normal limits [AE]  1608 CBC White blood cells, hemoglobin, platelets within normal limits [AE]  1608 Troponin T, High Sensitivity Within normal limits [AE]  1609 EKG 12-Lead Normal sinus rhythm [AE]    Clinical Course User Index [AE] Janit Kast, PA-C    IMPRESSION / MDM / ASSESSMENT AND PLAN / ED COURSE  I reviewed the triage vital signs and the nursing notes.  Differential diagnosis includes, but is not limited to, fall, right chest soft tissue injury, rib fracture, MI, ACS, depression, anemia, dehydration, UTI  Patient's presentation is most consistent with acute complicated illness / injury requiring diagnostic workup.   [name] is a 73 y.o., female who was brought today by her daughter after falling yesterday.  See HPI for further information.  On a physical exam, patient has systolic hypertension.  No tachypnea, no tachycardia, saturations within normal limits.  Right anterior chest, skin is intact, no ecchymosis or hematomas.  Tenderness to palpation in the right eighth rib with an anterior axillary line.  No depression.  Cardiopulmonary exam equal sounds bilaterally.  Abdomen within normal limits.  Right gluteal area, skin is intact, no ecchymosis hematomas, tender to palpation.  Hip full ROM.  Sensation is intact.  Pulses positive.  Extremities are equal.  Rest of the physical exam is normal. Plan Lidocaine   patch Acetaminophen  Reassess According to the patient the symptoms of shortness of breath and fatigue started before the fall, and she associated those after her son passed away.  I think patient is in a the grieving process.  Chest x-ray did not show any fractures, no pneumothorax.  CBC unremarkable ruling out anemia or infection.  Troponin within normal limits, EKG normal sinus rhythm.  Ruling out myocardial infarction, ACS.  BMP showed  electrolytes, renal function and anion gap within normal limits.  Ruling out electrolyte derangement, infection.  Patient is taking multiple medications that can produce side effects of fatigue and tiredness.  I did recommend the patient to have a follow-up appointment with her PCP Patient's diagnosis is consistent with fall, anterior chest wall soft tissue injury, soft tissue injury of the right gluteal area, grieving.  I independently reviewed and interpreted imaging and agree with radiologists findings. Labs are  reassuring. I did review the patient's allergies and medications.The patient is in stable and satisfactory condition for discharge home  Patient will be discharged home with prescriptions for lidocaine  patch, acetaminophen . Patient is to follow up with PCP as needed or otherwise directed. Patient is given ED precautions to return to the ED for any worsening or new symptoms. Discussed plan of care with patient, answered all of patient's questions, patient agreeable to plan of care. Advised patient to take medications according to the instructions on the label. Discussed possible side effects of new medications. Patient verbalized understanding.  FINAL CLINICAL IMPRESSION(S) / ED DIAGNOSES   Final diagnoses:  Fall, initial encounter  Chest wall muscle strain, initial encounter  Soft tissue injury  Grieving     Rx / DC Orders   ED Discharge Orders          Ordered    lidocaine  (LIDODERM ) 5 %  Every 12 hours        02/22/24 1707    acetaminophen   (ACETAMINOPHEN  8 HOUR) 650 MG CR tablet  Every 8 hours PRN        02/22/24 1708             Note:  This document was prepared using Dragon voice recognition software and may include unintentional dictation errors.   Janit Kast, PA-C 02/22/24 1709    Willo Dunnings, MD 02/22/24 2316  "

## 2024-02-22 NOTE — ED Notes (Signed)
Pt given box meal
